# Patient Record
Sex: Male | Born: 1935 | Race: White | Hispanic: No | Marital: Married | Smoking: Never smoker
Health system: Southern US, Community
[De-identification: ages and names within clinical notes are randomized; demographics above are authoritative.]

## PROBLEM LIST (undated history)

## (undated) DIAGNOSIS — M109 Gout, unspecified: Secondary | ICD-10-CM

## (undated) DIAGNOSIS — L97522 Non-pressure chronic ulcer of other part of left foot with fat layer exposed: Secondary | ICD-10-CM

## (undated) DIAGNOSIS — M899 Disorder of bone, unspecified: Secondary | ICD-10-CM

## (undated) DIAGNOSIS — I252 Old myocardial infarction: Secondary | ICD-10-CM

## (undated) HISTORY — PX: SMALL INTESTINE SURGERY: SHX150

## (undated) HISTORY — PX: TONSILLECTOMY: SUR1361

## (undated) HISTORY — DX: Old myocardial infarction: I25.2

## (undated) HISTORY — PX: CERVICAL SPINE SURGERY: SHX589

## (undated) HISTORY — DX: Non-pressure chronic ulcer of other part of left foot with fat layer exposed: L97.522

## (undated) HISTORY — PX: PARTIAL KNEE ARTHROPLASTY: SHX2174

## (undated) HISTORY — PX: ABDOMINAL ADHESION SURGERY: SHX90

## (undated) HISTORY — PX: BLEPHAROPLASTY: SUR158

## (undated) HISTORY — PX: ORIF TIBIAL SHAFT FRACTURE W/ PLATES AND SCREWS: SUR964

## (undated) HISTORY — PX: CHOLECYSTECTOMY: SHX55

## (undated) HISTORY — DX: Gout, unspecified: M10.9

## (undated) HISTORY — PX: CATARACT EXTRACTION: SUR2

## (undated) HISTORY — PX: NASAL SINUS SURGERY: SHX719

## (undated) HISTORY — DX: Disorder of bone, unspecified: M89.9

---

## 1998-04-08 HISTORY — PX: CORONARY ANGIOPLASTY WITH STENT PLACEMENT: SHX49

## 2006-04-17 ENCOUNTER — Inpatient Hospital Stay (HOSPITAL_COMMUNITY): Admission: RE | Admit: 2006-04-17 | Discharge: 2006-04-18 | Payer: Self-pay | Admitting: Orthopedic Surgery

## 2006-05-20 ENCOUNTER — Inpatient Hospital Stay (HOSPITAL_COMMUNITY): Admission: RE | Admit: 2006-05-20 | Discharge: 2006-05-21 | Payer: Self-pay | Admitting: Orthopedic Surgery

## 2006-06-04 ENCOUNTER — Ambulatory Visit (HOSPITAL_COMMUNITY): Admission: RE | Admit: 2006-06-04 | Discharge: 2006-06-04 | Payer: Self-pay | Admitting: Orthopedic Surgery

## 2006-06-04 ENCOUNTER — Ambulatory Visit: Payer: Self-pay | Admitting: Vascular Surgery

## 2014-12-02 DIAGNOSIS — E1149 Type 2 diabetes mellitus with other diabetic neurological complication: Secondary | ICD-10-CM

## 2014-12-02 DIAGNOSIS — E1142 Type 2 diabetes mellitus with diabetic polyneuropathy: Secondary | ICD-10-CM | POA: Insufficient documentation

## 2014-12-02 DIAGNOSIS — E119 Type 2 diabetes mellitus without complications: Secondary | ICD-10-CM | POA: Insufficient documentation

## 2014-12-02 HISTORY — DX: Type 2 diabetes mellitus with other diabetic neurological complication: E11.49

## 2015-02-15 DIAGNOSIS — I25119 Atherosclerotic heart disease of native coronary artery with unspecified angina pectoris: Secondary | ICD-10-CM

## 2015-02-15 DIAGNOSIS — I219 Acute myocardial infarction, unspecified: Secondary | ICD-10-CM

## 2015-02-15 DIAGNOSIS — I110 Hypertensive heart disease with heart failure: Secondary | ICD-10-CM

## 2015-02-15 HISTORY — DX: Hypertensive heart disease with heart failure: I11.0

## 2015-02-15 HISTORY — DX: Acute myocardial infarction, unspecified: I21.9

## 2015-02-15 HISTORY — DX: Atherosclerotic heart disease of native coronary artery with unspecified angina pectoris: I25.119

## 2015-05-17 DIAGNOSIS — M1A00X0 Idiopathic chronic gout, unspecified site, without tophus (tophi): Secondary | ICD-10-CM

## 2015-05-17 HISTORY — DX: Idiopathic chronic gout, unspecified site, without tophus (tophi): M1A.00X0

## 2015-06-12 DIAGNOSIS — J449 Chronic obstructive pulmonary disease, unspecified: Secondary | ICD-10-CM

## 2015-06-12 DIAGNOSIS — I482 Chronic atrial fibrillation, unspecified: Secondary | ICD-10-CM

## 2015-06-12 DIAGNOSIS — K4090 Unilateral inguinal hernia, without obstruction or gangrene, not specified as recurrent: Secondary | ICD-10-CM

## 2015-06-12 DIAGNOSIS — I4821 Permanent atrial fibrillation: Secondary | ICD-10-CM

## 2015-06-12 DIAGNOSIS — K219 Gastro-esophageal reflux disease without esophagitis: Secondary | ICD-10-CM

## 2015-06-12 HISTORY — DX: Unilateral inguinal hernia, without obstruction or gangrene, not specified as recurrent: K40.90

## 2015-06-12 HISTORY — DX: Gastro-esophageal reflux disease without esophagitis: K21.9

## 2015-06-12 HISTORY — DX: Chronic obstructive pulmonary disease, unspecified: J44.9

## 2015-06-12 HISTORY — DX: Chronic atrial fibrillation, unspecified: I48.20

## 2015-06-12 HISTORY — DX: Permanent atrial fibrillation: I48.21

## 2015-08-14 DIAGNOSIS — R0602 Shortness of breath: Secondary | ICD-10-CM

## 2015-08-14 HISTORY — DX: Shortness of breath: R06.02

## 2015-08-30 DIAGNOSIS — C8599 Non-Hodgkin lymphoma, unspecified, extranodal and solid organ sites: Secondary | ICD-10-CM

## 2015-08-30 DIAGNOSIS — I272 Pulmonary hypertension, unspecified: Secondary | ICD-10-CM

## 2015-08-30 DIAGNOSIS — G4733 Obstructive sleep apnea (adult) (pediatric): Secondary | ICD-10-CM

## 2015-08-30 DIAGNOSIS — G2581 Restless legs syndrome: Secondary | ICD-10-CM

## 2015-08-30 DIAGNOSIS — C179 Malignant neoplasm of small intestine, unspecified: Secondary | ICD-10-CM

## 2015-08-30 HISTORY — DX: Malignant neoplasm of small intestine, unspecified: C17.9

## 2015-08-30 HISTORY — DX: Restless legs syndrome: G25.81

## 2015-08-30 HISTORY — DX: Pulmonary hypertension, unspecified: I27.20

## 2015-08-30 HISTORY — DX: Non-Hodgkin lymphoma, unspecified, extranodal and solid organ sites: C85.99

## 2015-08-30 HISTORY — DX: Obstructive sleep apnea (adult) (pediatric): G47.33

## 2015-09-01 DIAGNOSIS — I5032 Chronic diastolic (congestive) heart failure: Secondary | ICD-10-CM

## 2015-09-01 HISTORY — DX: Chronic diastolic (congestive) heart failure: I50.32

## 2015-11-01 ENCOUNTER — Other Ambulatory Visit (HOSPITAL_COMMUNITY): Payer: Self-pay | Admitting: Dermatology

## 2015-11-01 DIAGNOSIS — D492 Neoplasm of unspecified behavior of bone, soft tissue, and skin: Secondary | ICD-10-CM

## 2015-11-01 DIAGNOSIS — D485 Neoplasm of uncertain behavior of skin: Secondary | ICD-10-CM

## 2015-11-06 ENCOUNTER — Ambulatory Visit (HOSPITAL_COMMUNITY)
Admission: RE | Admit: 2015-11-06 | Discharge: 2015-11-06 | Disposition: A | Discharge status: Home / Self Care | Payer: Medicare Other | Source: Ambulatory Visit | Attending: Dermatology | Admitting: Dermatology

## 2015-11-06 DIAGNOSIS — R2231 Localized swelling, mass and lump, right upper limb: Secondary | ICD-10-CM | POA: Diagnosis not present

## 2015-11-06 DIAGNOSIS — D485 Neoplasm of uncertain behavior of skin: Secondary | ICD-10-CM | POA: Insufficient documentation

## 2015-11-06 DIAGNOSIS — D492 Neoplasm of unspecified behavior of bone, soft tissue, and skin: Secondary | ICD-10-CM

## 2015-11-10 ENCOUNTER — Other Ambulatory Visit (HOSPITAL_COMMUNITY): Payer: Self-pay | Admitting: Dermatology

## 2015-11-10 ENCOUNTER — Ambulatory Visit (HOSPITAL_COMMUNITY)
Admission: RE | Admit: 2015-11-10 | Discharge: 2015-11-10 | Disposition: A | Discharge status: Home / Self Care | Payer: Medicare Other | Source: Ambulatory Visit | Attending: Dermatology | Admitting: Dermatology

## 2015-11-10 DIAGNOSIS — R9389 Abnormal findings on diagnostic imaging of other specified body structures: Secondary | ICD-10-CM

## 2015-11-16 ENCOUNTER — Ambulatory Visit
Admission: RE | Admit: 2015-11-16 | Discharge: 2015-11-16 | Disposition: A | Discharge status: Home / Self Care | Payer: Medicare Other | Source: Ambulatory Visit | Attending: Dermatology | Admitting: Dermatology

## 2015-11-16 ENCOUNTER — Other Ambulatory Visit: Payer: Self-pay | Admitting: Dermatology

## 2015-11-16 DIAGNOSIS — R9389 Abnormal findings on diagnostic imaging of other specified body structures: Secondary | ICD-10-CM

## 2015-11-16 MED ORDER — GADOBENATE DIMEGLUMINE 529 MG/ML IV SOLN: 20.0000 mL | Freq: Once | INTRAVENOUS | Status: DC | PRN

## 2015-11-16 MED ORDER — GADOBENATE DIMEGLUMINE 529 MG/ML IV SOLN
20.0000 mL | Freq: Once | INTRAVENOUS | Status: AC | PRN
  Administered 2015-11-16: 20 mL via INTRAVENOUS

## 2015-11-27 DIAGNOSIS — M6740 Ganglion, unspecified site: Secondary | ICD-10-CM

## 2015-11-27 DIAGNOSIS — G4700 Insomnia, unspecified: Secondary | ICD-10-CM | POA: Insufficient documentation

## 2015-11-27 HISTORY — DX: Insomnia, unspecified: G47.00

## 2015-11-27 HISTORY — DX: Ganglion, unspecified site: M67.40

## 2016-03-12 DIAGNOSIS — M1990 Unspecified osteoarthritis, unspecified site: Secondary | ICD-10-CM

## 2016-03-12 DIAGNOSIS — H269 Unspecified cataract: Secondary | ICD-10-CM | POA: Insufficient documentation

## 2016-03-12 DIAGNOSIS — Z961 Presence of intraocular lens: Secondary | ICD-10-CM

## 2016-03-12 HISTORY — DX: Unspecified osteoarthritis, unspecified site: M19.90

## 2016-03-12 HISTORY — DX: Presence of intraocular lens: Z96.1

## 2016-04-11 DIAGNOSIS — K56609 Unspecified intestinal obstruction, unspecified as to partial versus complete obstruction: Secondary | ICD-10-CM

## 2016-04-11 DIAGNOSIS — Z6835 Body mass index (BMI) 35.0-35.9, adult: Secondary | ICD-10-CM | POA: Insufficient documentation

## 2016-04-11 DIAGNOSIS — E669 Obesity, unspecified: Secondary | ICD-10-CM

## 2016-04-11 DIAGNOSIS — E785 Hyperlipidemia, unspecified: Secondary | ICD-10-CM

## 2016-04-11 DIAGNOSIS — D72829 Elevated white blood cell count, unspecified: Secondary | ICD-10-CM

## 2016-04-11 HISTORY — DX: Body mass index (BMI) 35.0-35.9, adult: Z68.35

## 2016-04-11 HISTORY — DX: Obesity, unspecified: E66.9

## 2016-04-11 HISTORY — DX: Hyperlipidemia, unspecified: E78.5

## 2016-04-11 HISTORY — DX: Elevated white blood cell count, unspecified: D72.829

## 2016-04-11 HISTORY — DX: Unspecified intestinal obstruction, unspecified as to partial versus complete obstruction: K56.609

## 2016-05-02 DIAGNOSIS — H2510 Age-related nuclear cataract, unspecified eye: Secondary | ICD-10-CM | POA: Insufficient documentation

## 2016-05-02 HISTORY — DX: Age-related nuclear cataract, unspecified eye: H25.10

## 2016-06-17 DIAGNOSIS — N529 Male erectile dysfunction, unspecified: Secondary | ICD-10-CM

## 2016-06-17 HISTORY — DX: Male erectile dysfunction, unspecified: N52.9

## 2016-10-10 ENCOUNTER — Encounter: Payer: Self-pay | Admitting: Cardiology

## 2016-10-11 ENCOUNTER — Encounter: Payer: Self-pay | Admitting: Cardiology

## 2016-10-11 ENCOUNTER — Ambulatory Visit (INDEPENDENT_AMBULATORY_CARE_PROVIDER_SITE_OTHER): Payer: Medicare Other | Admitting: Cardiology

## 2016-10-11 ENCOUNTER — Encounter (INDEPENDENT_AMBULATORY_CARE_PROVIDER_SITE_OTHER): Payer: Self-pay

## 2016-10-11 VITALS — BP 126/66 | HR 62 | Ht 70.75 in | Wt 285.8 lb

## 2016-10-11 DIAGNOSIS — E785 Hyperlipidemia, unspecified: Secondary | ICD-10-CM

## 2016-10-11 DIAGNOSIS — I5032 Chronic diastolic (congestive) heart failure: Secondary | ICD-10-CM | POA: Diagnosis not present

## 2016-10-11 DIAGNOSIS — R42 Dizziness and giddiness: Secondary | ICD-10-CM | POA: Insufficient documentation

## 2016-10-11 DIAGNOSIS — I10 Essential (primary) hypertension: Secondary | ICD-10-CM | POA: Diagnosis not present

## 2016-10-11 DIAGNOSIS — I482 Chronic atrial fibrillation, unspecified: Secondary | ICD-10-CM

## 2016-10-11 DIAGNOSIS — Z7901 Long term (current) use of anticoagulants: Secondary | ICD-10-CM

## 2016-10-11 DIAGNOSIS — I25118 Atherosclerotic heart disease of native coronary artery with other forms of angina pectoris: Secondary | ICD-10-CM | POA: Diagnosis not present

## 2016-10-11 HISTORY — DX: Dizziness and giddiness: R42

## 2016-10-11 HISTORY — DX: Long term (current) use of anticoagulants: Z79.01

## 2016-10-11 MED ORDER — METOPROLOL SUCCINATE ER 50 MG PO TB24: 50.0000 mg | ORAL_TABLET | Freq: Every day | ORAL | 5 refills | Status: DC

## 2016-10-11 NOTE — Patient Instructions (Addendum)
Medication Instructions:  Your physician has recommended you make the following change in your medication: decrease metoprolol from twice daily to once daily.   Labwork: None  Testing/Procedures: Your physician has recommended that you wear a holter monitor. Holter monitors are medical devices that record the heart's electrical activity. Doctors most often use these monitors to diagnose arrhythmias. Arrhythmias are problems with the speed or rhythm of the heartbeat. The monitor is a small, portable device. You can wear one while you do your normal daily activities. This is usually used to diagnose what is causing palpitations/syncope (passing out).  You will have this put on at [facility], [place], [place] [address]    Follow-Up: Your physician recommends that you schedule a follow-up appointment in: 6 weeks.   Any Other Special Instructions Will Be Listed Below (If Applicable).     If you need a refill on your cardiac medications before your next appointment, please call your pharmacy.  f

## 2016-10-11 NOTE — Progress Notes (Signed)
Cardiology Office Note:    Date:  10/11/2016   ID:  [name], DOB 1935-12-09, MRN [id number]  PCP:  Algis Greenhouse, MD  Cardiologist:  Shirlee More, MD    Referring MD: Algis Greenhouse, MD    ASSESSMENT:    1. Chronic atrial fibrillation ([place])   2. Chronic diastolic heart failure ([place])   3. Coronary artery disease of native artery of native heart with stable angina pectoris ([place])   4. Benign essential hypertension   5. Hyperlipidemia, unspecified hyperlipidemia type   6. Chronic anticoagulation    PLAN:    In order of problems listed above:  1. I'm concerned he is symptomatic bradycardia as his resting heart rate is relatively slow. Decrease his beta blocker dose by 50% where Holter monitor in approximately one week rates remain slow will discontinue his beta blocker. 2. stable compensated continue his current diuretic. 3. Stable continue current medical treatment including statin and beta blocker and calcium channel blocker. 4. Stable continue current medical treatment blood pressure target and all blood pressure determinations show no evidence of hypotension 5. They will continue his statin lipids are ideal 6. Stable continue his current anticoagulant without bleeding complication  Next appointment: 6 weeks   Medication Adjustments/Labs and Tests Ordered: Current medicines are reviewed at length with the patient today.  Concerns regarding medicines are outlined above.  No orders of the defined types were placed in this encounter.  No orders of the defined types were placed in this encounter.   Chief Complaint  Patient presents with  . Follow-up    Routine flup appt for heart failure, heart failure and CAD  . Dizziness    History of Present Illness:    Shawn Blanchard is a 81 y.o. male with a hx of CAD, CHF, Atrial Fibrillation, Dyslipidemia, HTN, S/P PCI of LAD 2002 EF 47% in 2016 last seen September 2017.He is having intermittent lightheadedness, near  syncope without hypotension on home BP checks.Otherwise he is pleased with the quality is life has no chest pain shortness of breath syncope or TIA. Compliance with diet, lifestyle and medications: Yes Past Medical History:  Diagnosis Date  . Old MI (myocardial infarction)     Past Surgical History:  Procedure Laterality Date  . CATARACT EXTRACTION Left   . CERVICAL SPINE SURGERY    . CHOLECYSTECTOMY    . NASAL SINUS SURGERY    . PARTIAL KNEE ARTHROPLASTY Bilateral   . SMALL INTESTINE SURGERY    . TONSILLECTOMY      Current Medications: Current Meds  Medication Sig  . albuterol (PROVENTIL HFA;VENTOLIN HFA) 108 (90 Base) MCG/ACT inhaler Inhale 2 puffs into the lungs every 6 (six) hours as needed for wheezing or shortness of breath.  . allopurinol (ZYLOPRIM) 100 MG tablet Take 50 mg by mouth 2 (two) times daily.  Marland Kitchen amLODipine-valsartan (EXFORGE) 5-320 MG tablet Take by mouth daily.  . Coenzyme Q10 100 MG capsule Take 100 mg by mouth daily.  . colchicine 0.6 MG tablet Take 0.6 mg by mouth daily as needed.  . dabigatran (PRADAXA) 150 MG CAPS capsule Take 150 mg by mouth 2 (two) times daily.  . furosemide (LASIX) 40 MG tablet Take 40 mg by mouth 2 (two) times daily.  . meclizine (ANTIVERT) 25 MG tablet Take 25 mg by mouth 3 (three) times daily as needed.  . metoprolol succinate (TOPROL-XL) 50 MG 24 hr tablet Take 50 mg by mouth 2 (two) times daily.  . nitroGLYCERIN (NITROSTAT) 0.4 MG  SL tablet Place 0.4 mg under the tongue.  . pramipexole (MIRAPEX) 0.25 MG tablet Take 0.25 mg by mouth 2 (two) times daily.  . pravastatin (PRAVACHOL) 40 MG tablet Take 40 mg by mouth daily.  . Saxagliptin-Metformin 2.08-998 MG TB24 Take by mouth 2 (two) times daily.  . trazodone (DESYREL) 300 MG tablet Take 300 mg by mouth at bedtime as needed.  . Turmeric POWD by Misc.(Non-Drug; Combo Route) route.  . vitamin E 400 UNIT capsule Take 400 Units by mouth daily.     Allergies:   Atorvastatin; Ace  inhibitors; Amoxicillin; Meperidine; Naproxen sodium; Sulfamethoxazole; and Tetracycline   Social History   Social History  . Marital status: Married    Spouse name: N/A  . Number of children: N/A  . Years of education: N/A   Social History Main Topics  . Smoking status: Never Smoker  . Smokeless tobacco: Never Used  . Alcohol use None  . Drug use: Unknown  . Sexual activity: Not Asked   Other Topics Concern  . None   Social History Narrative  . None     Family History: The patient's family history includes Cancer in his mother; Diabetes in his father and mother; Heart disease in his father and mother; Stroke in his father. ROS:   Please see the history of present illness.    All other systems reviewed and are negative.  EKGs/Labs/Other Studies Reviewed:    The following studies were reviewed today:  Recent Labs: CMP 04/11/16 normal,  No results found for requested labs within last 8760 hours.  Recent Lipid Panel 08/28/16 Chol 147, HDL 36, LDL 96 Aic 7.4% No results found for: CHOL, TRIG, HDL, CHOLHDL, VLDL, LDLCALC, LDLDIRECT  Physical Exam:    VS:  BP 126/66   Pulse 62   Ht 5' 10.75" (1.797 m)   Wt 285 lb 12.8 oz (129.6 kg)   SpO2 97%   BMI 40.14 kg/m     Wt Readings from Last 3 Encounters:  10/11/16 285 lb 12.8 oz (129.6 kg)     GEN:  Well nourished, well developed in no acute distress HEENT: Normal NECK: No JVD; No carotid bruits LYMPHATICS: No lymphadenopathy CARDIAC: Irr Irr, no murmurs, rubs, gallops RESPIRATORY:  Clear to auscultation without rales, wheezing or rhonchi  ABDOMEN: Soft, non-tender, non-distended MUSCULOSKELETAL:  No edema; No deformity  SKIN: Warm and dry NEUROLOGIC:  Alert and oriented x 3 PSYCHIATRIC:  Normal affect    Signed, Shirlee More, MD  10/11/2016 8:25 AM    [place]

## 2016-10-22 ENCOUNTER — Ambulatory Visit (INDEPENDENT_AMBULATORY_CARE_PROVIDER_SITE_OTHER): Payer: Medicare Other

## 2016-10-22 DIAGNOSIS — I482 Chronic atrial fibrillation, unspecified: Secondary | ICD-10-CM

## 2016-10-22 DIAGNOSIS — R42 Dizziness and giddiness: Secondary | ICD-10-CM

## 2016-11-07 ENCOUNTER — Telehealth: Payer: Self-pay | Admitting: Cardiology

## 2016-11-07 NOTE — Telephone Encounter (Signed)
Spoke with patient. Advised him that I did not send anything to him to his MyChart, but it looks like his PCP possibly did. Patient will call PCP.

## 2016-11-07 NOTE — Telephone Encounter (Signed)
Pt states he got a message from [facility] but that he can't open it and doesn't know who sent it or what it's about??

## 2016-11-21 ENCOUNTER — Telehealth: Payer: Self-pay | Admitting: Cardiology

## 2016-11-21 DIAGNOSIS — H02831 Dermatochalasis of right upper eyelid: Secondary | ICD-10-CM

## 2016-11-21 DIAGNOSIS — H02834 Dermatochalasis of left upper eyelid: Secondary | ICD-10-CM

## 2016-11-21 HISTORY — DX: Dermatochalasis of right upper eyelid: H02.831

## 2016-11-21 NOTE — Telephone Encounter (Signed)
Patient advised to follow surgeon's advice.

## 2016-11-21 NOTE — Telephone Encounter (Signed)
Patient just had eyelid surgery and has been off Pradaxa and concerned about blood clots and want sto go back on but surgeon suggested him to be off a few more days.. Please call.Marland Kitchen

## 2016-11-21 NOTE — Telephone Encounter (Signed)
Please advise 

## 2016-11-21 NOTE — Telephone Encounter (Signed)
I would follow advice of his surgeon

## 2016-11-24 NOTE — Progress Notes (Signed)
Cardiology Office Note:    Date:  11/25/2016   ID:  Shawn Blanchard, DOB Sep 20, 1935, MRN [id number]  PCP:  Algis Greenhouse, MD  Cardiologist:  Shirlee More, MD    Referring MD: Algis Greenhouse, MD    ASSESSMENT:    1. Chronic atrial fibrillation ([facility])    PLAN:    In order of problems listed above:  1. Improved continue his current beta blocker anticoagulant and I encouraged him to get the adapter for his apple watch monitor heart rhythm at home and contact me if rates are less than 55 bpm.   Next appointment: 6 months   Medication Adjustments/Labs and Tests Ordered: Current medicines are reviewed at length with the patient today.  Concerns regarding medicines are outlined above.  No orders of the defined types were placed in this encounter.  No orders of the defined types were placed in this encounter.   Chief Complaint  Patient presents with  . Follow-up    6 week flup to discuss HM results  . Atrial Fibrillation  . Dizziness    History of Present Illness:    Shawn Blanchard is a 81 y.o. male with a hx of CAD, CHF, Atrial Fibrillation, Dyslipidemia, HTN, S/P PCI of LAD 2002 EF 47% in 2016 last seen September 2017.He is having intermittent lightheadedness, near syncope  last seen 6 weeks ago with slow AF. Compliance with diet, lifestyle and medications: yes Past Medical History:  Diagnosis Date  . Old MI (myocardial infarction)     Past Surgical History:  Procedure Laterality Date  . CATARACT EXTRACTION Left   . CERVICAL SPINE SURGERY    . CHOLECYSTECTOMY    . NASAL SINUS SURGERY    . PARTIAL KNEE ARTHROPLASTY Bilateral   . SMALL INTESTINE SURGERY    . TONSILLECTOMY      Current Medications: No outpatient prescriptions have been marked as taking for the 11/25/16 encounter (Office Visit) with Richardo Priest, MD.     Allergies:   Atorvastatin; Ace inhibitors; Amoxicillin; Meperidine; Naproxen sodium; Sulfamethoxazole; and Tetracycline   Social  History   Social History  . Marital status: Married    Spouse name: N/A  . Number of children: N/A  . Years of education: N/A   Social History Main Topics  . Smoking status: Never Smoker  . Smokeless tobacco: Never Used  . Alcohol use None  . Drug use: Unknown  . Sexual activity: Not Asked   Other Topics Concern  . None   Social History Narrative  . None     Family History: The patient's family history includes Cancer in his mother; Diabetes in his father and mother; Heart disease in his father and mother; Stroke in his father. ROS:   Please see the history of present illness.    All other systems reviewed and are negative. Recent eye surgery for ptosis EKGs/Labs/Other Studies Reviewed:    The following studies were reviewed today:  Recent Labs: No results found for requested labs within last 8760 hours.  Recent Lipid Panel No results found for: CHOL, TRIG, HDL, CHOLHDL, VLDL, LDLCALC, LDLDIRECT  Physical Exam:    VS:  BP 134/64 (BP Location: Right Arm, Patient Position: Sitting)   Pulse 86   Ht 5' 10.75" (1.797 m)   Wt 287 lb (130.2 kg)   SpO2 97%   BMI 40.31 kg/m     Wt Readings from Last 3 Encounters:  11/25/16 287 lb (130.2 kg)  10/11/16 285 lb 12.8 oz (129.6  kg)     GEN:  Well nourished, well developed in no acute distress HEENT: Normal NECK: No JVD; No carotid bruits LYMPHATICS: No lymphadenopathy CARDIAC: Irr irr  no murmurs, rubs, gallops RESPIRATORY:  Clear to auscultation without rales, wheezing or rhonchi  ABDOMEN: Soft, non-tender, non-distended MUSCULOSKELETAL:  No edema; No deformity  SKIN: Warm and dry NEUROLOGIC:  Alert and oriented x 3 PSYCHIATRIC:  Normal affect    Signed, Shirlee More, MD  11/25/2016 12:03 PM    [place]

## 2016-11-25 ENCOUNTER — Ambulatory Visit (INDEPENDENT_AMBULATORY_CARE_PROVIDER_SITE_OTHER): Payer: Medicare Other | Admitting: Cardiology

## 2016-11-25 ENCOUNTER — Encounter: Payer: Self-pay | Admitting: Cardiology

## 2016-11-25 VITALS — BP 134/64 | HR 86 | Ht 70.75 in | Wt 287.0 lb

## 2016-11-25 DIAGNOSIS — I482 Chronic atrial fibrillation, unspecified: Secondary | ICD-10-CM

## 2016-11-25 NOTE — Patient Instructions (Addendum)
Medication Instructions:  Your physician recommends that you continue on your current medications as directed. Please refer to the Current Medication list given to you today.   Labwork: None  Testing/Procedures: None  Follow-Up: Your physician wants you to follow-up in: 6 months. You will receive a reminder letter in the mail two months in advance. If you don't receive a letter, please call our office to schedule the follow-up appointment.   Any Other Special Instructions Will Be Listed Below (If Applicable).     If you need a refill on your cardiac medications before your next appointment, please call your pharmacy.       Introducing KardiaBand, the new wearable EKG by [employer]. Vladimir Faster replaces your original Apple Watch band. The first of its kind, FDA-cleared KardiaBand provides accurate and instant analysis for detecting atrial fibrillation (AF) and normal sinus rhythm in an EKG. Simply place your thumb on the integrated KardiaBand sensor to take a medical-grade EKG in just 30 seconds. Results appear instantly on your Apple Watch. Vladimir Faster is available today for just $199. KardiaBand features are designed exclusively for use with [employer]  - $99 year. The [employer] for [employer] includes [employer]'s revolutionary SmartRhythm monitoring feature. SmartRhythm monitoring uses an intelligent neural network that runs directly on the [employer], constantly acquiring data from the watch's heart rate sensor and its accelerometer. SmartRhythm compares your heart rate to what it expects from your minute-by-minute level of activity. When the network sees a pattern of heart rate and activity that it does not expect, it notifies you to take an EKG. With [facility], peace of mind is just an EKG away.  Marland Kitchen

## 2016-12-26 DIAGNOSIS — J984 Other disorders of lung: Secondary | ICD-10-CM | POA: Insufficient documentation

## 2016-12-26 HISTORY — DX: Other disorders of lung: J98.4

## 2017-04-03 ENCOUNTER — Telehealth: Payer: Self-pay | Admitting: Cardiology

## 2017-04-03 NOTE — Telephone Encounter (Signed)
Please advise 

## 2017-04-03 NOTE — Telephone Encounter (Signed)
Patient states he is having a tooth extracted and one implanted and wants to know how long he should be off Predaxa?

## 2017-04-03 NOTE — Telephone Encounter (Signed)
Patient was informed.

## 2017-04-03 NOTE — Telephone Encounter (Signed)
usualyy no need to stop, if needed 24 hr before and after

## 2017-04-03 NOTE — Telephone Encounter (Signed)
Also needs nitro called to [facility]

## 2017-04-14 ENCOUNTER — Telehealth: Payer: Self-pay | Admitting: Cardiology

## 2017-04-14 MED ORDER — NITROGLYCERIN 0.4 MG SL SUBL: 0.4000 mg | SUBLINGUAL_TABLET | SUBLINGUAL | 11 refills | Status: DC | PRN

## 2017-04-14 NOTE — Telephone Encounter (Signed)
Call nitro to [facility]

## 2017-04-14 NOTE — Telephone Encounter (Signed)
Refill sent.

## 2017-05-26 ENCOUNTER — Other Ambulatory Visit: Payer: Self-pay | Admitting: Cardiology

## 2017-06-11 NOTE — Progress Notes (Signed)
Cardiology Office Note:    Date:  06/12/2017   ID:  [name], DOB 1936-03-17, MRN [id number]  PCP:  Algis Greenhouse, MD  Cardiologist:  Shirlee More, MD    Referring MD: Algis Greenhouse, MD    ASSESSMENT:    1. Chronic atrial fibrillation ([place])   2. Chronic anticoagulation   3. Chronic diastolic heart failure ([place])   4. Hypertensive heart disease with heart failure ([place])   5. Coronary artery disease of native artery of native heart with stable angina pectoris ([place])   6. Hyperlipidemia, unspecified hyperlipidemia type    PLAN:    In order of problems listed above:  1. Stable rate controlled continue his current anticoagulant.  Along with beta-blocker.  He will continue to monitor home heart rate which runs 60-80 bpm at rest.  Continue his current beta-blocker 2. Continue his current anticoagulant 3. Compensated continue his current dose of loop diuretic 4. Blood pressure stable continue current treatment including ARB CCP diuretic beta-blocker 5. Stable continue current medical treatment anticoagulant beta-blocker calcium channel blocker and statin.  I discussed additional treatment he is interested in Zetia and wants to discuss with his PCP   6 months Next appointment: 6 months   Medication Adjustments/Labs and Tests Ordered: Current medicines are reviewed at length with the patient today.  Concerns regarding medicines are outlined above.  No orders of the defined types were placed in this encounter.  No orders of the defined types were placed in this encounter.   Chief Complaint  Patient presents with  . Follow-up  . Atrial Fibrillation  . Congestive Heart Failure  . Coronary Artery Disease  . Anticoagulation    History of Present Illness:    Shawn Blanchard is a 82 y.o. male with a hx of CAD, CHF, Atrial Fibrillation, Dyslipidemia, HTN, S/P PCI of LAD 2002 EF 47% in 2016  last seen 6 months ago.  ASSESSMENT:   11/25/16   1. Chronic atrial  fibrillation ([facility])    PLAN:    I 1.    Improved continue his current beta blocker anticoagulant and I encouraged him to get the adapter for his apple watch monitor heart rhythm at home and contact me if rates are less than 55 bpm.  Compliance with diet, lifestyle and medications: yes  Is quite pleased with the quality of his life is seeing an alternate medicine physician [place] feels his diabetes is improved and is motivated to care for himself.  He monitors his home heart rate with a smart watch 60-80 bpm he has had no palpitations syncope bleeding from his anticoagulant TIA chest pain or shortness of breath.  Recent labs reviewed from his PCP office Past Medical History:  Diagnosis Date  . Old MI (myocardial infarction)     Past Surgical History:  Procedure Laterality Date  . CATARACT EXTRACTION Left   . CERVICAL SPINE SURGERY    . CHOLECYSTECTOMY    . NASAL SINUS SURGERY    . PARTIAL KNEE ARTHROPLASTY Bilateral   . SMALL INTESTINE SURGERY    . TONSILLECTOMY      Current Medications: Current Meds  Medication Sig  . albuterol (PROVENTIL HFA;VENTOLIN HFA) 108 (90 Base) MCG/ACT inhaler Inhale 2 puffs into the lungs every 6 (six) hours as needed for wheezing or shortness of breath.  . allopurinol (ZYLOPRIM) 100 MG tablet Take 50 mg by mouth 2 (two) times daily.  Marland Kitchen amLODipine-valsartan (EXFORGE) 5-320 MG tablet Take by mouth daily.  . Coenzyme Q10 100  MG capsule Take 100 mg by mouth daily.  . colchicine 0.6 MG tablet Take 0.6 mg by mouth daily as needed.  . furosemide (LASIX) 40 MG tablet Take 40 mg by mouth 2 (two) times daily.  Marland Kitchen JARDIANCE 25 MG TABS tablet Take 1 tablet by mouth daily.  . metoprolol succinate (TOPROL-XL) 50 MG 24 hr tablet Take 1 tablet (50 mg total) by mouth daily.  . nitroGLYCERIN (NITROSTAT) 0.4 MG SL tablet Place 1 tablet (0.4 mg total) under the tongue every 5 (five) minutes as needed for chest pain.  Marland Kitchen PRADAXA 150 MG CAPS capsule TAKE 1 CAPSULE BY  MOUTH 2 TIMES DAILY  . pramipexole (MIRAPEX) 0.25 MG tablet Take 0.25 mg by mouth 2 (two) times daily.  Marland Kitchen REPATHA SURECLICK 403 MG/ML SOAJ Inject 140 mg into the skin every 14 (fourteen) days.  . Saxagliptin-Metformin 2.08-998 MG TB24 Take by mouth 2 (two) times daily.  . trazodone (DESYREL) 300 MG tablet Take 300 mg by mouth at bedtime as needed.  . Turmeric POWD by Misc.(Non-Drug; Combo Route) route.  . vitamin E 400 UNIT capsule Take 400 Units by mouth daily.     Allergies:   Atorvastatin; Ace inhibitors; Amoxicillin; Meperidine; Naproxen sodium; Sulfamethoxazole; and Tetracycline   Social History   Socioeconomic History  . Marital status: Married    Spouse name: None  . Number of children: None  . Years of education: None  . Highest education level: None  Social Needs  . Financial resource strain: None  . Food insecurity - worry: None  . Food insecurity - inability: None  . Transportation needs - medical: None  . Transportation needs - non-medical: None  Occupational History  . None  Tobacco Use  . Smoking status: Never Smoker  . Smokeless tobacco: Never Used  Substance and Sexual Activity  . Alcohol use: None  . Drug use: None  . Sexual activity: None  Other Topics Concern  . None  Social History Narrative  . None     Family History: The patient's family history includes Cancer in his mother; Diabetes in his father and mother; Heart disease in his father and mother; Stroke in his father. ROS:   Please see the history of present illness.    All other systems reviewed and are negative.  EKGs/Labs/Other Studies Reviewed:    The following studies were reviewed today:  EKG:  EKG ordered today.  The ekg ordered today demonstrates AF 62 BPM   Recent Labs:  BMP normal 12/26/16 Recent Lipid Panel Chol 159, HDL 34 LDL 86  Physical Exam:    VS:  BP 124/70   Pulse 69   Ht 5' 10.75" (1.797 m)   Wt 267 lb 1.9 oz (121.2 kg)   SpO2 97%   BMI 37.52 kg/m     Wt  Readings from Last 3 Encounters:  06/12/17 267 lb 1.9 oz (121.2 kg)  11/25/16 287 lb (130.2 kg)  10/11/16 285 lb 12.8 oz (129.6 kg)     GEN:  Well nourished, well developed in no acute distress HEENT: Normal NECK: No JVD; No carotid bruits LYMPHATICS: No lymphadenopathy CARDIAC: Irr Irr variable s1  no murmurs, rubs, gallops RESPIRATORY:  Clear to auscultation without rales, wheezing or rhonchi  ABDOMEN: Soft, non-tender, non-distended MUSCULOSKELETAL:  No edema; No deformity  SKIN: Warm and dry NEUROLOGIC:  Alert and oriented x 3 PSYCHIATRIC:  Normal affect    Signed, Shirlee More, MD  06/12/2017 9:31 AM    [place]  [facility]

## 2017-06-12 ENCOUNTER — Encounter: Payer: Self-pay | Admitting: Cardiology

## 2017-06-12 ENCOUNTER — Ambulatory Visit: Payer: Medicare Other | Admitting: Cardiology

## 2017-06-12 VITALS — BP 124/70 | HR 69 | Ht 70.75 in | Wt 267.1 lb

## 2017-06-12 DIAGNOSIS — I482 Chronic atrial fibrillation, unspecified: Secondary | ICD-10-CM

## 2017-06-12 DIAGNOSIS — I5032 Chronic diastolic (congestive) heart failure: Secondary | ICD-10-CM

## 2017-06-12 DIAGNOSIS — E785 Hyperlipidemia, unspecified: Secondary | ICD-10-CM

## 2017-06-12 DIAGNOSIS — Z7901 Long term (current) use of anticoagulants: Secondary | ICD-10-CM | POA: Diagnosis not present

## 2017-06-12 DIAGNOSIS — I25118 Atherosclerotic heart disease of native coronary artery with other forms of angina pectoris: Secondary | ICD-10-CM | POA: Diagnosis not present

## 2017-06-12 DIAGNOSIS — I110 Hypertensive heart disease with heart failure: Secondary | ICD-10-CM

## 2017-06-12 NOTE — Patient Instructions (Signed)

## 2017-09-15 DIAGNOSIS — R42 Dizziness and giddiness: Secondary | ICD-10-CM

## 2017-09-15 HISTORY — DX: Dizziness and giddiness: R42

## 2017-10-06 NOTE — Progress Notes (Signed)
Cardiology Office Note:    Date:  10/08/2017   ID:  [name], DOB 1935/07/24, MRN [id number]  PCP:  Algis Greenhouse, MD  Cardiologist:  Shirlee More, MD    Referring MD: Algis Greenhouse, MD    ASSESSMENT:    1. Hypotension due to drugs   2. Coronary artery disease involving native coronary artery of native heart with angina pectoris ([place])   3. Chronic diastolic heart failure ([place])   4. Chronic atrial fibrillation (HCC)   5. Hypertensive heart disease with heart failure ([place])   6. Mixed hyperlipidemia   7. Chronic anticoagulation    PLAN:    In order of problems listed above:  1. His predominant problem is orthostatic hypotension with a standing blood pressure mean of 70.  I suspect this is multifactorial and related to his diabetes and medical treatment along with his diuretic and antihypertensive.  His heart failure is compensated I will continue his current loop diuretic I asked him to stop his ARB follow home blood pressures and reassess in the office in 3 weeks. 2. Stable continue current medical treatment if he fails to improve he may require an ischemia evaluation 3. Stable compensated continue his current diuretic he has no fluid overload 4. Stable rate controlled with and continue his beta-blocker along with his anticoagulant 5. He is symptomatic hypotensive see #1.  He no longer requires ARB 6. Med review shows he is not taking statin I will explore with him in the office in follow-up if he truly statin intolerant benefit from [place] [facility] treatment 7. Continue his anticoagulant with moderate stroke risk  3 weeks Next appointment: 3 weeks   Medication Adjustments/Labs and Tests Ordered: Current medicines are reviewed at length with the patient today.  Concerns regarding medicines are outlined above.  Orders Placed This Encounter  Procedures  . EKG 12-Lead   No orders of the defined types were placed in this encounter.   Chief Complaint  Patient presents with   . Dizziness    Upon rising  . Coronary Artery Disease  . Congestive Heart Failure  . Atrial Fibrillation  . Hypertension    History of Present Illness:    Shawn Blanchard is a 82 y.o. male with a hx of CAD, CHF, Atrial Fibrillation, Dyslipidemia, HTN, S/P PCI of LAD 2002 EF 47% in 2016     last seen 06/22/17. Compliance with diet, lifestyle and medications: Yes  He has a long-standing intermittent problem with orthostatic lightheadedness no vertigo no tinnitus and it is only from supine to standing especially in the morning when he first gets up.  Recently the symptoms have worsened.  He has lost a significant amount of weight in 6 and SGLT agent for diabetes.  He was seen with his PCP and instructed to reduce diuretic as well as ARB and he did not.  In office today he has demonstrable orthostatic hypotension has been blood pressure standing is in the range of 70 mmHg and asked him to discontinue his ARB check home blood pressures daily and follow-up in the office in 3 weeks.  In the last few years he has had Holter monitor and ischemia evaluation at this time will hold on cardiac diagnostic testing unless he fails to improve. He has become health-conscious has received alternate therapy is strict with his diet and except for lightheadedness on changing posture feels well and has had no angina dyspnea palpitation or syncope. Past Medical History:  Diagnosis Date  . Old  MI (myocardial infarction)     Past Surgical History:  Procedure Laterality Date  . CATARACT EXTRACTION Left   . CERVICAL SPINE SURGERY    . CHOLECYSTECTOMY    . NASAL SINUS SURGERY    . PARTIAL KNEE ARTHROPLASTY Bilateral   . SMALL INTESTINE SURGERY    . TONSILLECTOMY      Current Medications: Current Meds  Medication Sig  . albuterol (PROVENTIL HFA;VENTOLIN HFA) 108 (90 Base) MCG/ACT inhaler Inhale 2 puffs into the lungs every 6 (six) hours as needed for wheezing or shortness of breath.  . allopurinol  (ZYLOPRIM) 100 MG tablet Take 100 mg by mouth 2 (two) times daily.   . Coenzyme Q10 100 MG capsule Take 100 mg by mouth daily.  . colchicine 0.6 MG tablet Take 0.6 mg by mouth daily as needed.  . furosemide (LASIX) 40 MG tablet Take 40 mg by mouth 2 (two) times daily.  Marland Kitchen JARDIANCE 25 MG TABS tablet Take 1 tablet by mouth daily.  . metoprolol succinate (TOPROL-XL) 50 MG 24 hr tablet Take 1 tablet (50 mg total) by mouth daily.  . nitroGLYCERIN (NITROSTAT) 0.4 MG SL tablet Place 1 tablet (0.4 mg total) under the tongue every 5 (five) minutes as needed for chest pain.  Marland Kitchen PRADAXA 150 MG CAPS capsule TAKE 1 CAPSULE BY MOUTH 2 TIMES DAILY  . pramipexole (MIRAPEX) 0.25 MG tablet Take 0.25 mg by mouth 2 (two) times daily.  . Saxagliptin-Metformin 2.08-998 MG TB24 Take by mouth 2 (two) times daily.  . trazodone (DESYREL) 300 MG tablet Take 300 mg by mouth at bedtime as needed.  . Turmeric POWD by Misc.(Non-Drug; Combo Route) route.  . valsartan (DIOVAN) 320 MG tablet Take by mouth daily. Take 320 mg daily     Allergies:   Atorvastatin; Ace inhibitors; Amoxicillin; Meperidine; Naproxen sodium; Sulfamethoxazole; and Tetracycline   Social History   Socioeconomic History  . Marital status: Married    Spouse name: Not on file  . Number of children: Not on file  . Years of education: Not on file  . Highest education level: Not on file  Occupational History  . Not on file  Social Needs  . Financial resource strain: Not on file  . Food insecurity:    Worry: Not on file    Inability: Not on file  . Transportation needs:    Medical: Not on file    Non-medical: Not on file  Tobacco Use  . Smoking status: Never Smoker  . Smokeless tobacco: Never Used  Substance and Sexual Activity  . Alcohol use: Not on file  . Drug use: Not on file  . Sexual activity: Not on file  Lifestyle  . Physical activity:    Days per week: Not on file    Minutes per session: Not on file  . Stress: Not on file    Relationships  . Social connections:    Talks on phone: Not on file    Gets together: Not on file    Attends religious service: Not on file    Active member of club or organization: Not on file    Attends meetings of clubs or organizations: Not on file    Relationship status: Not on file  Other Topics Concern  . Not on file  Social History Narrative  . Not on file     Family History: The patient's family history includes Cancer in his mother; Diabetes in his father and mother; Heart disease in his father and mother;  Stroke in his father. ROS:   Please see the history of present illness.    All other systems reviewed and are negative.  EKGs/Labs/Other Studies Reviewed:    The following studies were reviewed today:  EKG:  EKG ordered today.  The ekg ordered today demonstrates atrial fibrillation controlled ventricular rate Asked me to reviewed labs from alternate medicine with [facility] his high-sensitivity CRP is quite elevated  Recent Labs:   06/30/17 A1C 6.5 No results found for requested labs within last 8760 hours.  Recent Lipid Panel   06/30/17 Chol 70, HDL 30 LDL 24 No results found for: CHOL, TRIG, HDL, CHOLHDL, VLDL, LDLCALC, LDLDIRECT  Physical Exam:    VS:  BP 130/64 (BP Location: Right Arm, Patient Position: Sitting, Cuff Size: Normal)   Pulse (!) 58   Ht 5' 10.75" (1.797 m)   Wt 161 lb (73 kg)   SpO2 96%   BMI 22.61 kg/m     Wt Readings from Last 3 Encounters:  10/08/17 161 lb (73 kg)  06/12/17 267 lb 1.9 oz (121.2 kg)  11/25/16 287 lb (130.2 kg)    Orthostatic blood pressure by me sitting 118/56 standing 104/50 with lightheadedness GEN:  Well nourished, well developed in no acute distress HEENT: Normal NECK: No JVD; No carotid bruits LYMPHATICS: No lymphadenopathy CARDIAC: Irregular irregular variable first heart sound  RESPIRATORY:  Clear to auscultation without rales, wheezing or rhonchi  ABDOMEN: Soft, non-tender, non-distended MUSCULOSKELETAL:  No  edema; No deformity  SKIN: Warm and dry NEUROLOGIC:  Alert and oriented x 3 PSYCHIATRIC:  Normal affect    Signed, Shirlee More, MD  10/08/2017 6:04 PM    [facility]

## 2017-10-08 ENCOUNTER — Encounter: Payer: Self-pay | Admitting: Cardiology

## 2017-10-08 ENCOUNTER — Ambulatory Visit: Payer: Medicare Other | Admitting: Cardiology

## 2017-10-08 VITALS — BP 130/64 | HR 58 | Ht 70.75 in | Wt 161.0 lb

## 2017-10-08 DIAGNOSIS — I5032 Chronic diastolic (congestive) heart failure: Secondary | ICD-10-CM

## 2017-10-08 DIAGNOSIS — I110 Hypertensive heart disease with heart failure: Secondary | ICD-10-CM | POA: Diagnosis not present

## 2017-10-08 DIAGNOSIS — I952 Hypotension due to drugs: Secondary | ICD-10-CM

## 2017-10-08 DIAGNOSIS — I25119 Atherosclerotic heart disease of native coronary artery with unspecified angina pectoris: Secondary | ICD-10-CM | POA: Diagnosis not present

## 2017-10-08 DIAGNOSIS — Z7901 Long term (current) use of anticoagulants: Secondary | ICD-10-CM

## 2017-10-08 DIAGNOSIS — I482 Chronic atrial fibrillation, unspecified: Secondary | ICD-10-CM

## 2017-10-08 DIAGNOSIS — E782 Mixed hyperlipidemia: Secondary | ICD-10-CM | POA: Diagnosis not present

## 2017-10-08 NOTE — Patient Instructions (Signed)
Medication Instructions:  Your physician has recommended you make the following change in your medication:  STOP: Valsartan  Your physician has requested that you regularly monitor and record your blood pressure readings at home. Please use the same machine at the same time of day to check your readings and record them to bring to your follow-up visit.   Labwork: NONE  Testing/Procedures: You had an EKG today   Follow-Up: Your physician recommends that you schedule a follow-up appointment in: 3 weeks.   Any Other Special Instructions Will Be Listed Below (If Applicable).     If you need a refill on your cardiac medications before your next appointment, please call your pharmacy.

## 2017-10-27 ENCOUNTER — Other Ambulatory Visit: Payer: Self-pay | Admitting: Cardiology

## 2017-10-27 DIAGNOSIS — I482 Chronic atrial fibrillation, unspecified: Secondary | ICD-10-CM

## 2017-10-27 DIAGNOSIS — R42 Dizziness and giddiness: Secondary | ICD-10-CM

## 2017-10-28 ENCOUNTER — Ambulatory Visit: Payer: Medicare Other | Admitting: Cardiology

## 2017-10-29 ENCOUNTER — Ambulatory Visit: Payer: Medicare Other | Admitting: Cardiology

## 2017-11-07 ENCOUNTER — Encounter: Payer: Self-pay | Admitting: Cardiology

## 2017-11-07 ENCOUNTER — Ambulatory Visit (INDEPENDENT_AMBULATORY_CARE_PROVIDER_SITE_OTHER): Payer: Medicare Other | Admitting: Cardiology

## 2017-11-07 VITALS — BP 132/76 | HR 74 | Ht 70.0 in | Wt 164.0 lb

## 2017-11-07 DIAGNOSIS — I952 Hypotension due to drugs: Secondary | ICD-10-CM

## 2017-11-07 DIAGNOSIS — I5023 Acute on chronic systolic (congestive) heart failure: Secondary | ICD-10-CM

## 2017-11-07 DIAGNOSIS — I5032 Chronic diastolic (congestive) heart failure: Secondary | ICD-10-CM

## 2017-11-07 DIAGNOSIS — I482 Chronic atrial fibrillation, unspecified: Secondary | ICD-10-CM

## 2017-11-07 DIAGNOSIS — I110 Hypertensive heart disease with heart failure: Secondary | ICD-10-CM | POA: Diagnosis not present

## 2017-11-07 DIAGNOSIS — I25119 Atherosclerotic heart disease of native coronary artery with unspecified angina pectoris: Secondary | ICD-10-CM

## 2017-11-07 DIAGNOSIS — E785 Hyperlipidemia, unspecified: Secondary | ICD-10-CM

## 2017-11-07 DIAGNOSIS — N529 Male erectile dysfunction, unspecified: Secondary | ICD-10-CM

## 2017-11-07 DIAGNOSIS — I10 Essential (primary) hypertension: Secondary | ICD-10-CM

## 2017-11-07 DIAGNOSIS — Z7901 Long term (current) use of anticoagulants: Secondary | ICD-10-CM

## 2017-11-07 HISTORY — DX: Hyperlipidemia, unspecified: E78.5

## 2017-11-07 HISTORY — DX: Essential (primary) hypertension: I10

## 2017-11-07 HISTORY — DX: Acute on chronic systolic (congestive) heart failure: I50.23

## 2017-11-07 MED ORDER — TADALAFIL 5 MG PO TABS: 5.0000 mg | ORAL_TABLET | Freq: Every day | ORAL | 0 refills | Status: DC | PRN

## 2017-11-07 NOTE — Patient Instructions (Signed)

## 2017-11-07 NOTE — Progress Notes (Signed)
Cardiology Office Note:    Date:  11/07/2017   ID:  Shawn Blanchard, DOB [birth year], MRN [id number]  PCP:  Algis Greenhouse, MD  Cardiologist:  Shirlee More, MD    Referring MD: Algis Greenhouse, MD    ASSESSMENT:    1. Hypotension due to drugs   2. Hypertensive heart disease with heart failure ([place])   3. Coronary artery disease involving native coronary artery of native heart with angina pectoris ([place])   4. Chronic diastolic heart failure ([place])   5. Chronic atrial fibrillation (HCC)   6. Chronic anticoagulation   7. Dyslipidemia   8. Erectile dysfunction, unspecified erectile dysfunction type    PLAN:    In order of problems listed above:  1. Resolved off his ARB 2. BP is stable continue current treatment including his beta-blocker and diuretic 3. Stable continue medical treatment 4. Stable compensated continue current loop diuretic New York Heart Association class I he has no edema 5. Stable rate controlled continue anticoagulant beta-blocker 6. Stable no bleeding complication continue his anticoagulant 7. Poorly controlled statin intolerant 8. His request prescription for Cialis   Next appointment: As needed, routinely for cardiology 1 year   Medication Adjustments/Labs and Tests Ordered: Current medicines are reviewed at length with the patient today.  Concerns regarding medicines are outlined above.  No orders of the defined types were placed in this encounter.  Meds ordered this encounter  Medications  . tadalafil (CIALIS) 5 MG tablet    Sig: Take 1 tablet (5 mg total) by mouth daily as needed for erectile dysfunction.    Dispense:  10 tablet    Refill:  0    No chief complaint on file.   History of Present Illness:    Shawn Blanchard is a 81 y.o. male with a hx of hypertension history of myocardial infarction with PCI and stent to the LAD in 7035, diastolic heart failure, atrial fibrillation, hypertension chronic anticoagulation and hyper lipidemia.  He  was last seen 10/08/2017.  Myocardial perfusion study 2016 showed no ischemia ejection fraction 47%. Within a few days of stopping his ARB his dizziness resolved and home blood pressures run in the range of 009-381 systolic.  He feels markedly improved is pleased with the quality of life and has had no angina dyspnea palpitation or syncope. Compliance with diet, lifestyle and medications: Yes Past Medical History:  Diagnosis Date  . Old MI (myocardial infarction)     Past Surgical History:  Procedure Laterality Date  . CATARACT EXTRACTION Left   . CERVICAL SPINE SURGERY    . CHOLECYSTECTOMY    . NASAL SINUS SURGERY    . PARTIAL KNEE ARTHROPLASTY Bilateral   . SMALL INTESTINE SURGERY    . TONSILLECTOMY      Current Medications: Current Meds  Medication Sig  . albuterol (PROVENTIL HFA;VENTOLIN HFA) 108 (90 Base) MCG/ACT inhaler Inhale 2 puffs into the lungs every 6 (six) hours as needed for wheezing or shortness of breath.  . allopurinol (ZYLOPRIM) 100 MG tablet Take 100 mg by mouth 2 (two) times daily.   . Coenzyme Q10 100 MG capsule Take 100 mg by mouth daily.  . colchicine 0.6 MG tablet Take 0.6 mg by mouth daily as needed.  . furosemide (LASIX) 40 MG tablet Take 40 mg by mouth 2 (two) times daily.  Marland Kitchen JARDIANCE 25 MG TABS tablet Take 1 tablet by mouth daily.  . metoprolol succinate (TOPROL-XL) 50 MG 24 hr tablet TAKE 1 AND 1/2 TABLETS BY  MOUTH EVERY 12HOURS FOR HEART/BLOOD PRESSURE  . nitroGLYCERIN (NITROSTAT) 0.4 MG SL tablet Place 1 tablet (0.4 mg total) under the tongue every 5 (five) minutes as needed for chest pain.  Marland Kitchen PRADAXA 150 MG CAPS capsule TAKE 1 CAPSULE BY MOUTH 2 TIMES DAILY  . pramipexole (MIRAPEX) 0.25 MG tablet Take 0.25 mg by mouth 2 (two) times daily.  . Saxagliptin-Metformin 2.08-998 MG TB24 Take by mouth 2 (two) times daily.  . trazodone (DESYREL) 300 MG tablet Take 300 mg by mouth at bedtime as needed.  . Turmeric POWD by Misc.(Non-Drug; Combo Route) route.      Allergies:   Atorvastatin; Ace inhibitors; Amoxicillin; Meperidine; Naproxen sodium; Sulfamethoxazole; and Tetracycline   Social History   Socioeconomic History  . Marital status: Married    Spouse name: Not on file  . Number of children: Not on file  . Years of education: Not on file  . Highest education level: Not on file  Occupational History  . Not on file  Social Needs  . Financial resource strain: Not on file  . Food insecurity:    Worry: Not on file    Inability: Not on file  . Transportation needs:    Medical: Not on file    Non-medical: Not on file  Tobacco Use  . Smoking status: Never Smoker  . Smokeless tobacco: Never Used  Substance and Sexual Activity  . Alcohol use: Not on file  . Drug use: Not on file  . Sexual activity: Not on file  Lifestyle  . Physical activity:    Days per week: Not on file    Minutes per session: Not on file  . Stress: Not on file  Relationships  . Social connections:    Talks on phone: Not on file    Gets together: Not on file    Attends religious service: Not on file    Active member of club or organization: Not on file    Attends meetings of clubs or organizations: Not on file    Relationship status: Not on file  Other Topics Concern  . Not on file  Social History Narrative  . Not on file     Family History: The patient's family history includes Cancer in his mother; Diabetes in his father and mother; Heart disease in his father and mother; Stroke in his father. ROS:   Please see the history of present illness.    All other systems reviewed and are negative.  EKGs/Labs/Other Studies Reviewed:    The following studies were reviewed today:  He is due for wellness exam and lab work September  Recent Labs:   06/30/17   total cholesterol 70 HDL 30 LDL 24 CMP 12/26/2016 creatinine 1.28 TSH normal liver function normal potassium 4.6   Physical Exam:    VS:  BP 132/76 (BP Location: Right Arm, Patient Position: Sitting,  Cuff Size: Normal)   Pulse 74   Ht 5\' 10"  (1.778 m)   Wt 164 lb (74.4 kg)   SpO2 98%   BMI 23.53 kg/m     Wt Readings from Last 3 Encounters:  11/07/17 164 lb (74.4 kg)  10/08/17 161 lb (73 kg)  06/12/17 267 lb 1.9 oz (121.2 kg)     GEN:  Well nourished, well developed in no acute distress HEENT: Normal NECK: No JVD; No carotid bruits LYMPHATICS: No lymphadenopathy CARDIAC: RRR, no murmurs, rubs, gallops RESPIRATORY:  Clear to auscultation without rales, wheezing or rhonchi  ABDOMEN: Soft, non-tender, non-distended MUSCULOSKELETAL:  No edema; No deformity  SKIN: Warm and dry NEUROLOGIC:  Alert and oriented x 3 PSYCHIATRIC:  Normal affect    Signed, Shirlee More, MD  11/07/2017 5:47 PM    [facility]

## 2018-01-05 ENCOUNTER — Telehealth: Payer: Self-pay | Admitting: Cardiology

## 2018-01-05 ENCOUNTER — Other Ambulatory Visit: Payer: Self-pay

## 2018-01-05 NOTE — Telephone Encounter (Signed)
Patient has been informed that this is okay. ASA will be added to med rec.

## 2018-01-05 NOTE — Telephone Encounter (Signed)
Dr. Garlon Hatchet would like to add 81 mg enteric aspirin, patient would like to inquire regarding if it is ok for him to take this with his pradaxa. Patient states he already has many bruises and spots on his arms. Dr. Garlon Hatchet requested patient call and ask.

## 2018-01-05 NOTE — Telephone Encounter (Signed)
Patient would like a call back regarding taking aspirin

## 2018-01-05 NOTE — Telephone Encounter (Signed)
Ok to use low dose 81 mg aspirin and pradaxa

## 2018-05-04 ENCOUNTER — Other Ambulatory Visit: Payer: Self-pay | Admitting: Cardiology

## 2018-09-21 ENCOUNTER — Other Ambulatory Visit: Payer: Self-pay | Admitting: Cardiology

## 2018-09-21 DIAGNOSIS — R42 Dizziness and giddiness: Secondary | ICD-10-CM

## 2018-09-21 DIAGNOSIS — I482 Chronic atrial fibrillation, unspecified: Secondary | ICD-10-CM

## 2018-10-02 DIAGNOSIS — Z7984 Long term (current) use of oral hypoglycemic drugs: Secondary | ICD-10-CM

## 2018-10-02 DIAGNOSIS — H02883 Meibomian gland dysfunction of right eye, unspecified eyelid: Secondary | ICD-10-CM | POA: Insufficient documentation

## 2018-10-02 DIAGNOSIS — H01009 Unspecified blepharitis unspecified eye, unspecified eyelid: Secondary | ICD-10-CM

## 2018-10-02 DIAGNOSIS — H02831 Dermatochalasis of right upper eyelid: Secondary | ICD-10-CM

## 2018-10-02 DIAGNOSIS — H02886 Meibomian gland dysfunction of left eye, unspecified eyelid: Secondary | ICD-10-CM

## 2018-10-02 HISTORY — DX: Meibomian gland dysfunction of left eye, unspecified eyelid: H02.886

## 2018-10-02 HISTORY — DX: Meibomian gland dysfunction of right eye, unspecified eyelid: H02.883

## 2018-10-02 HISTORY — DX: Dermatochalasis of right upper eyelid: H02.831

## 2018-10-02 HISTORY — DX: Long term (current) use of oral hypoglycemic drugs: Z79.84

## 2018-10-02 HISTORY — DX: Unspecified blepharitis unspecified eye, unspecified eyelid: H01.009

## 2018-11-16 ENCOUNTER — Other Ambulatory Visit: Payer: Self-pay | Admitting: Cardiology

## 2018-11-16 DIAGNOSIS — I482 Chronic atrial fibrillation, unspecified: Secondary | ICD-10-CM

## 2018-11-16 DIAGNOSIS — R42 Dizziness and giddiness: Secondary | ICD-10-CM

## 2018-11-23 ENCOUNTER — Other Ambulatory Visit: Payer: Self-pay | Admitting: Cardiology

## 2018-11-23 DIAGNOSIS — R42 Dizziness and giddiness: Secondary | ICD-10-CM

## 2018-11-23 DIAGNOSIS — I482 Chronic atrial fibrillation, unspecified: Secondary | ICD-10-CM

## 2018-12-16 DIAGNOSIS — E1142 Type 2 diabetes mellitus with diabetic polyneuropathy: Secondary | ICD-10-CM

## 2018-12-16 HISTORY — DX: Type 2 diabetes mellitus with diabetic polyneuropathy: E11.42

## 2018-12-28 ENCOUNTER — Ambulatory Visit (INDEPENDENT_AMBULATORY_CARE_PROVIDER_SITE_OTHER): Payer: Medicare Other

## 2018-12-28 DIAGNOSIS — R42 Dizziness and giddiness: Secondary | ICD-10-CM

## 2018-12-28 DIAGNOSIS — I951 Orthostatic hypotension: Secondary | ICD-10-CM | POA: Diagnosis not present

## 2018-12-28 DIAGNOSIS — I482 Chronic atrial fibrillation, unspecified: Secondary | ICD-10-CM

## 2018-12-29 DIAGNOSIS — L089 Local infection of the skin and subcutaneous tissue, unspecified: Secondary | ICD-10-CM

## 2018-12-29 HISTORY — DX: Local infection of the skin and subcutaneous tissue, unspecified: L08.9

## 2019-01-18 ENCOUNTER — Other Ambulatory Visit: Payer: Self-pay | Admitting: Cardiology

## 2019-01-18 DIAGNOSIS — I482 Chronic atrial fibrillation, unspecified: Secondary | ICD-10-CM

## 2019-01-18 DIAGNOSIS — R42 Dizziness and giddiness: Secondary | ICD-10-CM

## 2019-01-20 ENCOUNTER — Other Ambulatory Visit: Payer: Self-pay | Admitting: Cardiology

## 2019-01-20 DIAGNOSIS — I482 Chronic atrial fibrillation, unspecified: Secondary | ICD-10-CM

## 2019-01-20 DIAGNOSIS — R42 Dizziness and giddiness: Secondary | ICD-10-CM

## 2019-01-20 MED ORDER — METOPROLOL SUCCINATE ER 50 MG PO TB24: ORAL_TABLET | ORAL | 0 refills | Status: DC

## 2019-01-20 NOTE — Telephone Encounter (Signed)
°*  STAT* If patient is at the pharmacy, call can be transferred to refill team.   1. Which medications need to be refilled? (please list name of each medication and dose if known) metoprolol succinate (TOPROL-XL)  2. Which pharmacy/location (including street and city if local pharmacy) is medication to be sent to? 3. Do they need a 30 day or 90 day supply?  [place], [place] - [address] [phone] (Phone) [phone] (Fax)

## 2019-01-20 NOTE — Telephone Encounter (Signed)
Called patient and scheduled him a follow up appointment with Dr. [name], he is overdue. Refilled medication until his follow up appointment.

## 2019-01-22 NOTE — Progress Notes (Signed)
Cardiology Office Note:    Date:  01/25/2019   ID:  Shawn Blanchard, DOB 1935/10/16, MRN [id number]  PCP:  Algis Greenhouse, MD  Cardiologist:  Shirlee More, MD    Referring MD: Algis Greenhouse, MD    ASSESSMENT:    1. Orthostatic hypotension   2. Coronary artery disease involving native coronary artery of native heart with angina pectoris ([place])   3. Chronic diastolic heart failure ([place])   4. Hypertensive heart disease with heart failure ([place])   5. Chronic atrial fibrillation (HCC)   6. Chronic anticoagulation   7. Dyslipidemia    PLAN:    In order of problems listed above:  1. Ongoing symptoms of orthostatic dizziness I asked him to hold his SGLT2 agent follow blood sugars at home and wear a 7-day event monitor to be sure he is not having symptomatic bradycardia with his atrial fibrillation.  And reduce his Toprol-XL from 75 to 50 mg/day. 2. Stable New York Heart Association class I continue medical therapy having no angina on current treatment 3. Stable compensated heart failure continue his low-dose loop diuretic 4. Reduce beta-blocker with relatively slow atrial fibrillation 5. Use beta-blocker 7-day event monitor if rates remain relatively low further reduce his beta-blocker discontinue 6. Continue his current anticoagulant 7. Lipids are at target    Next appointment: 6 months   Medication Adjustments/Labs and Tests Ordered: Current medicines are reviewed at length with the patient today.  Concerns regarding medicines are outlined above.  No orders of the defined types were placed in this encounter.  No orders of the defined types were placed in this encounter.   Chief Complaint  Patient presents with  . Follow-up    for  . Coronary Artery Disease  . Congestive Heart Failure  . Hypertension  . Hyperlipidemia  . Hypotension    Orthostatic    History of Present Illness:    Shawn Blanchard is a 83 y.o. male with a hx of CAD, CHF, Atrial Fibrillation,  Dyslipidemia, HTN, S/P PCI of LAD 2002 EF 47% in 2016 orthostatic hypotension and T2 DM with neuropathy  last seen 11/07/2017. Compliance with diet, lifestyle and medications: Yes  Overall is done well unfortunately continues to have orthostatic symptoms specialist hands in the morning and recently had a fall with dislocation of the finger where he felt as if he would faint.  He has atrial fibrillation and wear a 7-day event monitor to screen for symptomatic bradycardia.  With his diabetes and neuropathy I asked him to hold his SGLT2 agent which may be causing sodium and water loss and to follow blood sugars at home.  His heart failure is compensated no edema orthopnea shortness of breath atrial fibrillation is rate controlled no bleeding complications anticoagulant he has had no angina.  Labs with his PCP shows an A1c of 7.4 LDL was at target 56. Past Medical History:  Diagnosis Date  . Old MI (myocardial infarction)     Past Surgical History:  Procedure Laterality Date  . CATARACT EXTRACTION Left   . CERVICAL SPINE SURGERY    . CHOLECYSTECTOMY    . NASAL SINUS SURGERY    . PARTIAL KNEE ARTHROPLASTY Bilateral   . SMALL INTESTINE SURGERY    . TONSILLECTOMY      Current Medications: Current Meds  Medication Sig  . allopurinol (ZYLOPRIM) 100 MG tablet Take 100 mg by mouth 2 (two) times daily as needed.   Marland Kitchen aspirin EC 81 MG tablet Take 81 mg by  mouth daily.  . Coenzyme Q10 100 MG capsule Take 100 mg by mouth daily.  . colchicine 0.6 MG tablet Take 0.6 mg by mouth daily as needed.  . furosemide (LASIX) 40 MG tablet Take 40 mg by mouth 2 (two) times daily.  Marland Kitchen JARDIANCE 25 MG TABS tablet Take 1 tablet by mouth daily.  Marland Kitchen MELATONIN PO Take 1 tablet by mouth daily as needed.  . metoprolol succinate (TOPROL-XL) 50 MG 24 hr tablet TAKE 1 AND 1/2 TABLETS BY MOUTH EVERY 12HOURS FOR HEART AND BLOOD PRESSURE  . nitroGLYCERIN (NITROSTAT) 0.4 MG SL tablet Place 1 tablet (0.4 mg total) under the tongue  every 5 (five) minutes as needed for chest pain.  Marland Kitchen PRADAXA 150 MG CAPS capsule TAKE 1 CAPSULE BY MOUTH 2 TIMES DAILY  . pramipexole (MIRAPEX) 0.25 MG tablet Take 0.25 mg by mouth 2 (two) times daily.  . Saxagliptin-Metformin 2.08-998 MG TB24 Take 1 tablet by mouth 2 (two) times daily.   . Turmeric POWD by Misc.(Non-Drug; Combo Route) route.     Allergies:   Atorvastatin, Ace inhibitors, Amoxicillin, Meperidine, Naproxen sodium, Sulfamethoxazole, and Tetracycline   Social History   Socioeconomic History  . Marital status: Married    Spouse name: Not on file  . Number of children: Not on file  . Years of education: Not on file  . Highest education level: Not on file  Occupational History  . Not on file  Social Needs  . Financial resource strain: Not on file  . Food insecurity    Worry: Not on file    Inability: Not on file  . Transportation needs    Medical: Not on file    Non-medical: Not on file  Tobacco Use  . Smoking status: Never Smoker  . Smokeless tobacco: Never Used  Substance and Sexual Activity  . Alcohol use: Not Currently  . Drug use: Never  . Sexual activity: Not on file  Lifestyle  . Physical activity    Days per week: Not on file    Minutes per session: Not on file  . Stress: Not on file  Relationships  . Social Herbalist on phone: Not on file    Gets together: Not on file    Attends religious service: Not on file    Active member of club or organization: Not on file    Attends meetings of clubs or organizations: Not on file    Relationship status: Not on file  Other Topics Concern  . Not on file  Social History Narrative  . Not on file     Family History: The patient's family history includes Cancer in his mother; Diabetes in his father and mother; Heart disease in his father and mother; Stroke in his father. ROS:   Please see the history of present illness.    All other systems reviewed and are negative.  EKGs/Labs/Other Studies  Reviewed:    The following studies were reviewed today:  EKG:  EKG ordered today and personally reviewed.  The ekg ordered today demonstrates atrial fibrillation controlled rate 56 bpm  Recent Labs: 12/16/2018: A1C 7.4% Cr 1.3 GFR 52 cc/min Chol 104 TG 204 HDL 32 LDL 55 Hgb 16.6 Hct 50.7  Physical Exam:    VS:  BP 122/74 (BP Location: Right Arm, Patient Position: Sitting, Cuff Size: Large)   Pulse (!) 56   Ht 5\' 11"  (1.803 m)   Wt 273 lb (123.8 kg)   SpO2 98%   BMI  38.08 kg/m     Wt Readings from Last 3 Encounters:  01/25/19 273 lb (123.8 kg)  11/07/17 164 lb (74.4 kg)  10/08/17 161 lb (73 kg)     GEN:  Well nourished, well developed in no acute distress HEENT: Normal NECK: No JVD; No carotid bruits LYMPHATICS: No lymphadenopathy CARDIAC: Irregular rhythm variable first heart sound no murmurs, rubs, gallops RESPIRATORY:  Clear to auscultation without rales, wheezing or rhonchi  ABDOMEN: Soft, non-tender, non-distended MUSCULOSKELETAL:  No edema; No deformity  SKIN: Warm and dry NEUROLOGIC:  Alert and oriented x 3 PSYCHIATRIC:  Normal affect    Signed, Shirlee More, MD  01/25/2019 11:45 AM    [place]

## 2019-01-25 ENCOUNTER — Other Ambulatory Visit: Payer: Self-pay

## 2019-01-25 ENCOUNTER — Encounter: Payer: Self-pay | Admitting: Cardiology

## 2019-01-25 ENCOUNTER — Ambulatory Visit (INDEPENDENT_AMBULATORY_CARE_PROVIDER_SITE_OTHER): Payer: Medicare Other | Admitting: Cardiology

## 2019-01-25 VITALS — BP 122/74 | HR 56 | Ht 71.0 in | Wt 273.0 lb

## 2019-01-25 DIAGNOSIS — I25119 Atherosclerotic heart disease of native coronary artery with unspecified angina pectoris: Secondary | ICD-10-CM | POA: Diagnosis not present

## 2019-01-25 DIAGNOSIS — I951 Orthostatic hypotension: Secondary | ICD-10-CM | POA: Diagnosis not present

## 2019-01-25 DIAGNOSIS — Z7901 Long term (current) use of anticoagulants: Secondary | ICD-10-CM

## 2019-01-25 DIAGNOSIS — I110 Hypertensive heart disease with heart failure: Secondary | ICD-10-CM

## 2019-01-25 DIAGNOSIS — I5032 Chronic diastolic (congestive) heart failure: Secondary | ICD-10-CM

## 2019-01-25 DIAGNOSIS — I482 Chronic atrial fibrillation, unspecified: Secondary | ICD-10-CM

## 2019-01-25 DIAGNOSIS — R42 Dizziness and giddiness: Secondary | ICD-10-CM

## 2019-01-25 DIAGNOSIS — E785 Hyperlipidemia, unspecified: Secondary | ICD-10-CM

## 2019-01-25 MED ORDER — METOPROLOL SUCCINATE ER 50 MG PO TB24: 50.0000 mg | ORAL_TABLET | Freq: Every day | ORAL | 1 refills | Status: DC

## 2019-01-25 NOTE — Patient Instructions (Addendum)
Medication Instructions:  Your physician has recommended you make the following change in your medication:   DECREASE metoprolol succinate (toprol-XL) 50 mg: Take 1 tablet daily   *If you need a refill on your cardiac medications before your next appointment, please call your pharmacy*  Lab Work: None  If you have labs (blood work) drawn today and your tests are completely normal, you will receive your results only by: Marland Kitchen MyChart Message (if you have MyChart) OR . A paper copy in the mail If you have any lab test that is abnormal or we need to change your treatment, we will call you to review the results.  Testing/Procedures: You had an EKG today.   Your physician has recommended that you wear a ZIO monitor. ZIO monitors are medical devices that record the heart's electrical activity. Doctors most often use these monitors to diagnose arrhythmias. Arrhythmias are problems with the speed or rhythm of the heartbeat. The monitor is a small, portable device. You can wear one while you do your normal daily activities. This is usually used to diagnose what is causing palpitations/syncope (passing out). Wear for 7 days.   Follow-Up: At [facility], you and your health needs are our priority.  As part of our continuing mission to provide you with exceptional heart care, we have created designated Provider Care Teams.  These Care Teams include your primary Cardiologist (physician) and Advanced Practice Providers (APPs -  Physician Assistants and Nurse Practitioners) who all work together to provide you with the care you need, when you need it.  Your next appointment:   6 months  The format for your next appointment:   In Person  Provider:   Shirlee More, MD

## 2019-02-09 ENCOUNTER — Other Ambulatory Visit: Payer: Self-pay | Admitting: Cardiology

## 2019-02-18 ENCOUNTER — Telehealth: Payer: Self-pay | Admitting: Cardiology

## 2019-02-18 NOTE — Telephone Encounter (Signed)
Left message on patient's home and cell phone to return call to discuss.

## 2019-02-18 NOTE — Telephone Encounter (Signed)
Please call patient with monitor results. 

## 2019-02-19 NOTE — Telephone Encounter (Signed)
Patient informed of ZIO monitor results and advised to continue his current medications as prescribed per Dr. [name]. Patient is agreeable and verbalized understanding. No further questions.

## 2019-03-25 DIAGNOSIS — G5601 Carpal tunnel syndrome, right upper limb: Secondary | ICD-10-CM

## 2019-03-25 DIAGNOSIS — M79642 Pain in left hand: Secondary | ICD-10-CM | POA: Insufficient documentation

## 2019-03-25 DIAGNOSIS — M79641 Pain in right hand: Secondary | ICD-10-CM

## 2019-03-25 HISTORY — DX: Pain in right hand: M79.641

## 2019-03-25 HISTORY — DX: Carpal tunnel syndrome, right upper limb: G56.01

## 2019-03-25 HISTORY — DX: Pain in left hand: M79.642

## 2019-04-09 HISTORY — PX: CARPAL TUNNEL RELEASE: SHX101

## 2019-04-13 ENCOUNTER — Other Ambulatory Visit: Payer: Self-pay | Admitting: Cardiology

## 2019-04-13 DIAGNOSIS — I482 Chronic atrial fibrillation, unspecified: Secondary | ICD-10-CM

## 2019-04-13 DIAGNOSIS — R42 Dizziness and giddiness: Secondary | ICD-10-CM

## 2019-06-01 ENCOUNTER — Telehealth: Payer: Self-pay | Admitting: Cardiology

## 2019-06-01 MED ORDER — FUROSEMIDE 20 MG PO TABS: 20.0000 mg | ORAL_TABLET | Freq: Two times a day (BID) | ORAL | 3 refills | Status: DC

## 2019-06-01 NOTE — Telephone Encounter (Signed)
Pt c/o medication issue:  1. Name of Medication: furosemide (LASIX) 40 MG tablet  JARDIANCE 25 MG TABS tablet  2. How are you currently taking this medication (dosage and times per day)? Fursemide 2 times daily Jardiance 1 tablet by mouth daily   3. Are you having a reaction (difficulty breathing--STAT)? No  4. What is your medication issue? Patient is calling stating when he tried to pick up his medications at the pharmacy they stated the refill was denied. Shawn Blanchard states he will be running out of the medications this week. Please advise.

## 2019-06-01 NOTE — Telephone Encounter (Signed)
Returned call to patient, he states he needs a new rx for furosemide and jardiance.    He states he is taking furosemide 20 mg BID (he has been cutting tablet in half).    Advised would send new rx, advised to contact PCP for Jardiance rx if possible since they prescribed and are following his DM.    Patient agreed.  Patient states he also needs surgery on his wrist, and plans to discuss this at upcoming visit with Dr. [name].

## 2019-06-03 ENCOUNTER — Other Ambulatory Visit: Payer: Self-pay | Admitting: *Deleted

## 2019-06-03 ENCOUNTER — Telehealth: Payer: Self-pay | Admitting: *Deleted

## 2019-06-03 ENCOUNTER — Other Ambulatory Visit: Payer: Self-pay

## 2019-06-03 DIAGNOSIS — Z7901 Long term (current) use of anticoagulants: Secondary | ICD-10-CM

## 2019-06-03 DIAGNOSIS — I951 Orthostatic hypotension: Secondary | ICD-10-CM

## 2019-06-03 DIAGNOSIS — I25119 Atherosclerotic heart disease of native coronary artery with unspecified angina pectoris: Secondary | ICD-10-CM

## 2019-06-03 NOTE — Progress Notes (Signed)
bme

## 2019-06-03 NOTE — Telephone Encounter (Signed)
Please set up for BMET and forward to pharmacy for review prior to clearance.

## 2019-06-03 NOTE — Telephone Encounter (Signed)
   [facility] Pre-operative Risk Assessment    Request for surgical clearance:  1. What type of surgery is being performed?  RIGHT CARPAL TUNNEL RELEASE   2. When is this surgery scheduled?  TBD   3. What type of clearance is required (medical clearance vs. Pharmacy clearance to hold med vs. Both)?  BOTH  4. Are there any medications that need to be held prior to surgery and how long? PRADAXA   5. Practice name and name of physician performing surgery?  [facility] / DR. GRAMMIG   6. What is your office phone number [phone]    7.   What is your office fax number [phone]  8.   Anesthesia type (None, local, MAC, general) ?  LOCAL   Shawn Blanchard 06/03/2019, 9:33 AM  _________________________________________________________________   (provider comments below)

## 2019-06-03 NOTE — Telephone Encounter (Signed)
Left voice mail to call back 

## 2019-06-03 NOTE — Telephone Encounter (Signed)
Pt is agreeable to plan of care for lab work to be done [employer]). Pt states he will go today. Lab work to be done at Dr. Joya Gaskins office in [place]. Pt thanked me for the call.

## 2019-06-03 NOTE — Telephone Encounter (Signed)
Pt takes Pradaxa for afib with CHADS2VASc score of 6 (age x2, CHF, HTN, DM, CAD). Pt has not had renal function checked since 2018. At that time, SCr was 1.28, CrCl 60 using adjusted body weight. Would recommend checking updated renal function as this will affect Pradaxa clearance. Pt may need 2 day Pradaxa hold instead of 1 day hold pending level of renal impairment.

## 2019-06-04 LAB — BASIC METABOLIC PANEL
BUN/Creatinine Ratio: 19 (ref 10–24)
BUN: 25 mg/dL (ref 8–27)
CO2: 22 mmol/L (ref 20–29)
Calcium: 9.2 mg/dL (ref 8.6–10.2)
Chloride: 103 mmol/L (ref 96–106)
Creatinine, Ser: 1.31 mg/dL — ABNORMAL HIGH (ref 0.76–1.27)
GFR calc Af Amer: 59 mL/min/{1.73_m2} — ABNORMAL LOW (ref >59)
GFR calc non Af Amer: 51 mL/min/{1.73_m2} — ABNORMAL LOW (ref >59)
Glucose: 197 mg/dL — ABNORMAL HIGH (ref 65–99)
Potassium: 4.8 mmol/L (ref 3.5–5.2)
Sodium: 141 mmol/L (ref 134–144)

## 2019-06-04 NOTE — Telephone Encounter (Signed)
SCr 1.31, CrCl 77 using actual body weight but is 59 using adjusted body weight. Based on renal function, recommend holding Pradaxa for 1.5 days (3 doses) prior to procedure per protocol.

## 2019-06-04 NOTE — Telephone Encounter (Signed)
   Primary Cardiologist: Shirlee More, MD  Chart reviewed as part of pre-operative protocol coverage. Given past medical history and time since last visit, based on ACC/AHA guidelines, Shawn Blanchard would be at acceptable risk for the planned procedure without further cardiovascular testing.   I will route this recommendation to the requesting party via Epic fax function and remove from pre-op pool.  Please call with questions.  [place], [place] 06/04/2019, 8:45 AM

## 2019-06-07 ENCOUNTER — Telehealth: Payer: Self-pay | Admitting: Cardiology

## 2019-06-07 NOTE — Telephone Encounter (Signed)
Shawn Blanchard is calling requesting we send his most recent labs performed on 06/03/19 to Dr. Cathlean Sauer. The fax number for his office is [phone].

## 2019-06-07 NOTE — Telephone Encounter (Signed)
Sent recent lab over via epic fax.

## 2019-06-13 DIAGNOSIS — N1831 Chronic kidney disease, stage 3a: Secondary | ICD-10-CM | POA: Insufficient documentation

## 2019-06-13 HISTORY — DX: Chronic kidney disease, stage 3a: N18.31

## 2019-06-17 NOTE — Progress Notes (Signed)
Cardiology Office Note:    Date:  06/18/2019   ID:  Shawn Blanchard, DOB October 22, 1935, MRN [id number]  PCP:  Algis Greenhouse, MD  Cardiologist:  Shirlee More, MD    Referring MD: Algis Greenhouse, MD    ASSESSMENT:    1. Chronic atrial fibrillation ([place])   2. Chronic anticoagulation   3. Orthostatic hypotension   4. Hypertensive heart disease with heart failure ([place])   5. Coronary artery disease involving native coronary artery of native heart with angina pectoris ([place])   6. Dyslipidemia    PLAN:    In order of problems listed above:  1. Stable rate controlled due to beta-blocker anticoagulant 2. Continue his anticoagulant, for carpal tunnel surgery of asked the patient to hold 2 days prior and usually 1 to 2 days afterwards determined by the surgeon 3. No orthostatic shift in blood pressure today 4. improved BP at target continue current treatment without orthostatic shift 5. Stable CAD continue medical therapy 6. Stable continue his current statin are ideal 7. Stable diabetes mellitus managed by his PCP he is on appropriate cardioprotective medications with type 2 diabetes   Next appointment: 6 months   Medication Adjustments/Labs and Tests Ordered: Current medicines are reviewed at length with the patient today.  Concerns regarding medicines are outlined above.  No orders of the defined types were placed in this encounter.  No orders of the defined types were placed in this encounter.   Chief Complaint  Patient presents with   Follow-up   Hypotension    History of Present Illness:    Shawn Blanchard is a 84 y.o. male with a hx of CAD, CHF, Atrial Fibrillation, Dyslipidemia, HTN, S/P PCI of LAD 2002 EF 47% in 2016 orthostatic hypotension and T2 DM with neuropathy last seen 01/25/2019.  Dramatic orthostatic hypotension is SGLT2 inhibitor was withdrawn and beta-blocker was decreased in dosage.  His event monitor subsequently shows well-controlled atrial  fibrillation without bradycardia Compliance with diet, lifestyle and medications: Yes  He continues to have marked unsteadiness shifting posture but no orthostatic hypotension and his monitor shows no significant bradycardia.  No edema shortness of breath palpitation or syncope he is walking 150 minutes a week.  I reviewed the results of the ZIO monitor with the patient during the visit Study Highlights A ZIO monitor was performed for 6 days 21 hours to assess atrial fibrillation.  The study was initiated 01/25/2019.  The rhythm throughout was atrial fibrillation.  Heart rate control was good.  Minimum average and maximum heart rates of 36, 60 to 121 bpm.  The slowest sustained atrial fibrillation was 49 bpm.  There were no pauses greater than 3 seconds.  Daytime heart rates are between 50 and 198% of the time, nighttime heart rates are between 50 and 192% of the time and less than 50 bpm 8% nocturnal time. Ventricular ectopy is rare with PVCs and couplets. There were no triggered or diary events  Conclusion atrial fibrillation throughout the recording with good heart rate control.  Rare ventricular ectopy is seen.    Past Medical History:  Diagnosis Date   Old MI (myocardial infarction)     Past Surgical History:  Procedure Laterality Date   CATARACT EXTRACTION Left    CERVICAL SPINE SURGERY     CHOLECYSTECTOMY     NASAL SINUS SURGERY     PARTIAL KNEE ARTHROPLASTY Bilateral    SMALL INTESTINE SURGERY     TONSILLECTOMY      Current Medications: Current  Meds  Medication Sig   allopurinol (ZYLOPRIM) 100 MG tablet Take 100 mg by mouth 2 (two) times daily as needed.    aspirin EC 81 MG tablet Take 81 mg by mouth daily.   cephALEXin (KEFLEX) 500 MG capsule Take 500 mg by mouth 2 (two) times daily.   furosemide (LASIX) 20 MG tablet Take 1 tablet (20 mg total) by mouth 2 (two) times daily.   glucose blood test strip OneTouch Ultra Blue Test Strip   JARDIANCE 25 MG TABS  tablet Take 1 tablet by mouth daily.   Lancets (ONETOUCH DELICA PLUS 123XX123) MISC OneTouch Delica Plus Lancet 33 gauge   MELATONIN PO Take 1 tablet by mouth daily as needed.   metoprolol succinate (TOPROL-XL) 50 MG 24 hr tablet TAKE 1 TABLET BY MOUTH EVERY DAY   nitroGLYCERIN (NITROSTAT) 0.4 MG SL tablet Place 1 tablet (0.4 mg total) under the tongue every 5 (five) minutes as needed for chest pain.   PRADAXA 150 MG CAPS capsule TAKE 1 CAPSULE BY MOUTH 2 TIMES DAILY   pramipexole (MIRAPEX) 0.25 MG tablet Take 0.25 mg by mouth 2 (two) times daily.   REPATHA SURECLICK XX123456 MG/ML SOAJ XX123456 Milligram(s) SUB-Q Every 2 Weeks   Saxagliptin-Metformin 2.08-998 MG TB24 Take 1 tablet by mouth 2 (two) times daily.    Turmeric POWD by Misc.(Non-Drug; Combo Route) route.     Allergies:   Atorvastatin, Sulfa antibiotics, Ace inhibitors, Amoxicillin, Meperidine, Naproxen sodium, Sulfamethoxazole, and Tetracycline   Social History   Socioeconomic History   Marital status: Married    Spouse name: Not on file   Number of children: Not on file   Years of education: Not on file   Highest education level: Not on file  Occupational History   Not on file  Tobacco Use   Smoking status: Never Smoker   Smokeless tobacco: Never Used  Substance and Sexual Activity   Alcohol use: Not Currently   Drug use: Never   Sexual activity: Not on file  Other Topics Concern   Not on file  Social History Narrative   Not on file   Social Determinants of Health   Financial Resource Strain:    Difficulty of Paying Living Expenses:   Food Insecurity:    Worried About Charity fundraiser in the Last Year:    Arboriculturist in the Last Year:   Transportation Needs:    Film/video editor (Medical):    Lack of Transportation (Non-Medical):   Physical Activity:    Days of Exercise per Week:    Minutes of Exercise per Session:   Stress:    Feeling of Stress :   Social  Connections:    Frequency of Communication with Friends and Family:    Frequency of Social Gatherings with Friends and Family:    Attends Religious Services:    Active Member of Clubs or Organizations:    Attends Music therapist:    Marital Status:      Family History: The patient's family history includes Cancer in his mother; Diabetes in his father and mother; Heart disease in his father and mother; Stroke in his father. ROS:   Please see the history of present illness.    All other systems reviewed and are negative.  EKGs/Labs/Other Studies Reviewed:    The following studies were reviewed today:   Recent Labs: Reviewed, from his primary care physician 06/17/2019: A1c 7.0% Hemoglobin 16.6  Cholesterol 147 HDL 33 LDL Mean 1.21  potassium 4.4  Physical Exam:    VS:  BP (!) 150/70    Pulse 66    Temp 97.9 F (36.6 C)    Ht 5\' 11"  (1.803 m)    Wt 277 lb (125.6 kg)    SpO2 95%    BMI 38.63 kg/m     Wt Readings from Last 3 Encounters:  06/18/19 277 lb (125.6 kg)  01/25/19 273 lb (123.8 kg)  11/07/17 164 lb (74.4 kg)    38/60 sitting standing GEN:  Well nourished, well developed in no acute distress HEENT: Normal NECK: No JVD; No carotid bruits LYMPHATICS: No lymphadenopathy CARDIAC: Irregular S1 variable RRR, no murmurs, rubs, gallops RESPIRATORY:  Clear to auscultation without rales, wheezing or rhonchi  ABDOMEN: Soft, non-tender, non-distended MUSCULOSKELETAL:  No edema; No deformity  SKIN: Warm and dry NEUROLOGIC:  Alert and oriented x 3 PSYCHIATRIC:  Normal affect    Signed, Shirlee More, MD  06/18/2019 10:45 AM    [place]

## 2019-06-18 ENCOUNTER — Encounter: Payer: Self-pay | Admitting: Cardiology

## 2019-06-18 ENCOUNTER — Other Ambulatory Visit: Payer: Self-pay

## 2019-06-18 ENCOUNTER — Ambulatory Visit (INDEPENDENT_AMBULATORY_CARE_PROVIDER_SITE_OTHER): Payer: Medicare PPO | Admitting: Cardiology

## 2019-06-18 VITALS — BP 150/70 | HR 66 | Temp 97.9°F | Ht 71.0 in | Wt 277.0 lb

## 2019-06-18 DIAGNOSIS — I482 Chronic atrial fibrillation, unspecified: Secondary | ICD-10-CM

## 2019-06-18 DIAGNOSIS — Z7901 Long term (current) use of anticoagulants: Secondary | ICD-10-CM

## 2019-06-18 DIAGNOSIS — I951 Orthostatic hypotension: Secondary | ICD-10-CM

## 2019-06-18 DIAGNOSIS — I25119 Atherosclerotic heart disease of native coronary artery with unspecified angina pectoris: Secondary | ICD-10-CM

## 2019-06-18 DIAGNOSIS — E785 Hyperlipidemia, unspecified: Secondary | ICD-10-CM

## 2019-06-18 DIAGNOSIS — I110 Hypertensive heart disease with heart failure: Secondary | ICD-10-CM

## 2019-06-18 NOTE — Patient Instructions (Addendum)
Medication Instructions:  Your physician recommends that you continue on your current medications as directed. Please refer to the Current Medication list given to you today.  *If you need a refill on your cardiac medications before your next appointment, please call your pharmacy*   Lab Work: None ordered  If you have labs (blood work) drawn today and your tests are completely normal, you will receive your results only by: . MyChart Message (if you have MyChart) OR . A paper copy in the mail If you have any lab test that is abnormal or we need to change your treatment, we will call you to review the results.   Testing/Procedures: None ordered   Follow-Up: At [facility], you and your health needs are our priority.  As part of our continuing mission to provide you with exceptional heart care, we have created designated Provider Care Teams.  These Care Teams include your primary Cardiologist (physician) and Advanced Practice Providers (APPs -  Physician Assistants and Nurse Practitioners) who all work together to provide you with the care you need, when you need it.  We recommend signing up for the patient portal called "MyChart".  Sign up information is provided on this After Visit Summary.  MyChart is used to connect with patients for Virtual Visits (Telemedicine).  Patients are able to view lab/test results, encounter notes, upcoming appointments, etc.  Non-urgent messages can be sent to your provider as well.   To learn more about what you can do with MyChart, go to [email or web address].    Your next appointment:   6 month(s)  The format for your next appointment:   In Person  Provider:   Brian Munley, MD   Other Instructions  

## 2019-08-17 ENCOUNTER — Other Ambulatory Visit: Payer: Self-pay | Admitting: Cardiology

## 2019-09-10 ENCOUNTER — Encounter: Payer: Self-pay | Admitting: Cardiology

## 2019-10-04 DIAGNOSIS — H903 Sensorineural hearing loss, bilateral: Secondary | ICD-10-CM | POA: Insufficient documentation

## 2019-10-04 HISTORY — DX: Sensorineural hearing loss, bilateral: H90.3

## 2019-10-05 DIAGNOSIS — H0259 Other disorders affecting eyelid function: Secondary | ICD-10-CM | POA: Insufficient documentation

## 2019-10-05 HISTORY — DX: Other disorders affecting eyelid function: H02.59

## 2019-12-02 ENCOUNTER — Telehealth: Payer: Self-pay | Admitting: Cardiology

## 2019-12-02 MED ORDER — CARVEDILOL 3.125 MG PO TABS: 3.1250 mg | ORAL_TABLET | Freq: Two times a day (BID) | ORAL | 3 refills | Status: DC

## 2019-12-02 NOTE — Telephone Encounter (Signed)
Patient would like to share some updated BP readings with Lilia Pro - did not want to read out to me.

## 2019-12-02 NOTE — Telephone Encounter (Signed)
Left message on patients voicemail to please return our call.   

## 2019-12-02 NOTE — Addendum Note (Signed)
Addended by: Resa Miner I on: 12/02/2019 03:56 PM   Modules accepted: Orders

## 2019-12-02 NOTE — Telephone Encounter (Signed)
Let us have him try carvedilol 3.125 mg twice a day.

## 2019-12-02 NOTE — Telephone Encounter (Signed)
Spoke with the patient just now and he let me know that he has been keeping a record of his blood pressure readings. He states that he stopped his metoprolol back in June due to having symptoms from it. He states that since stopping this metoprolol his symptoms have resolved but now his blood pressure is high again. The last week blood pressures have been around 170/75 per the patient. He is wondering if there is something else he could take for his blood pressure that hopefully would not cause him to have any symptoms.

## 2019-12-02 NOTE — Telephone Encounter (Signed)
Spoke to the patient just now and let him know these recommendations. He verbalizes understanding and thanks me for the call back.  

## 2019-12-14 ENCOUNTER — Inpatient Hospital Stay (HOSPITAL_COMMUNITY): Payer: Medicare PPO

## 2019-12-14 ENCOUNTER — Inpatient Hospital Stay (HOSPITAL_COMMUNITY)
Admission: EM | Admit: 2019-12-14 | Discharge: 2019-12-17 | DRG: 389 | Disposition: A | Discharge status: Home / Self Care | Payer: Medicare PPO | Attending: Internal Medicine | Admitting: Internal Medicine

## 2019-12-14 ENCOUNTER — Encounter (HOSPITAL_COMMUNITY): Payer: Self-pay | Admitting: Emergency Medicine

## 2019-12-14 ENCOUNTER — Other Ambulatory Visit: Payer: Self-pay

## 2019-12-14 ENCOUNTER — Emergency Department (HOSPITAL_COMMUNITY): Payer: Medicare PPO

## 2019-12-14 DIAGNOSIS — I5032 Chronic diastolic (congestive) heart failure: Secondary | ICD-10-CM | POA: Diagnosis not present

## 2019-12-14 DIAGNOSIS — I4821 Permanent atrial fibrillation: Secondary | ICD-10-CM

## 2019-12-14 DIAGNOSIS — I130 Hypertensive heart and chronic kidney disease with heart failure and stage 1 through stage 4 chronic kidney disease, or unspecified chronic kidney disease: Secondary | ICD-10-CM | POA: Diagnosis present

## 2019-12-14 DIAGNOSIS — Z20822 Contact with and (suspected) exposure to covid-19: Secondary | ICD-10-CM | POA: Diagnosis present

## 2019-12-14 DIAGNOSIS — R6510 Systemic inflammatory response syndrome (SIRS) of non-infectious origin without acute organ dysfunction: Secondary | ICD-10-CM | POA: Diagnosis present

## 2019-12-14 DIAGNOSIS — D72829 Elevated white blood cell count, unspecified: Secondary | ICD-10-CM | POA: Diagnosis present

## 2019-12-14 DIAGNOSIS — C8590 Non-Hodgkin lymphoma, unspecified, unspecified site: Secondary | ICD-10-CM | POA: Diagnosis present

## 2019-12-14 DIAGNOSIS — E1149 Type 2 diabetes mellitus with other diabetic neurological complication: Secondary | ICD-10-CM | POA: Diagnosis present

## 2019-12-14 DIAGNOSIS — I351 Nonrheumatic aortic (valve) insufficiency: Secondary | ICD-10-CM | POA: Diagnosis present

## 2019-12-14 DIAGNOSIS — Z79890 Hormone replacement therapy (postmenopausal): Secondary | ICD-10-CM

## 2019-12-14 DIAGNOSIS — Z8249 Family history of ischemic heart disease and other diseases of the circulatory system: Secondary | ICD-10-CM

## 2019-12-14 DIAGNOSIS — I252 Old myocardial infarction: Secondary | ICD-10-CM | POA: Diagnosis not present

## 2019-12-14 DIAGNOSIS — Z7982 Long term (current) use of aspirin: Secondary | ICD-10-CM

## 2019-12-14 DIAGNOSIS — Z7901 Long term (current) use of anticoagulants: Secondary | ICD-10-CM | POA: Diagnosis not present

## 2019-12-14 DIAGNOSIS — K56600 Partial intestinal obstruction, unspecified as to cause: Secondary | ICD-10-CM | POA: Diagnosis present

## 2019-12-14 DIAGNOSIS — E1142 Type 2 diabetes mellitus with diabetic polyneuropathy: Secondary | ICD-10-CM | POA: Diagnosis present

## 2019-12-14 DIAGNOSIS — N1831 Chronic kidney disease, stage 3a: Secondary | ICD-10-CM | POA: Diagnosis present

## 2019-12-14 DIAGNOSIS — J449 Chronic obstructive pulmonary disease, unspecified: Secondary | ICD-10-CM | POA: Diagnosis present

## 2019-12-14 DIAGNOSIS — Y848 Other medical procedures as the cause of abnormal reaction of the patient, or of later complication, without mention of misadventure at the time of the procedure: Secondary | ICD-10-CM | POA: Diagnosis not present

## 2019-12-14 DIAGNOSIS — I10 Essential (primary) hypertension: Secondary | ICD-10-CM | POA: Diagnosis present

## 2019-12-14 DIAGNOSIS — E872 Acidosis: Secondary | ICD-10-CM | POA: Diagnosis present

## 2019-12-14 DIAGNOSIS — Z9861 Coronary angioplasty status: Secondary | ICD-10-CM

## 2019-12-14 DIAGNOSIS — G4730 Sleep apnea, unspecified: Secondary | ICD-10-CM | POA: Diagnosis present

## 2019-12-14 DIAGNOSIS — Z882 Allergy status to sulfonamides status: Secondary | ICD-10-CM | POA: Diagnosis not present

## 2019-12-14 DIAGNOSIS — K56609 Unspecified intestinal obstruction, unspecified as to partial versus complete obstruction: Secondary | ICD-10-CM

## 2019-12-14 DIAGNOSIS — N179 Acute kidney failure, unspecified: Secondary | ICD-10-CM | POA: Diagnosis present

## 2019-12-14 DIAGNOSIS — E860 Dehydration: Secondary | ICD-10-CM | POA: Diagnosis present

## 2019-12-14 DIAGNOSIS — Z91013 Allergy to seafood: Secondary | ICD-10-CM

## 2019-12-14 DIAGNOSIS — E1122 Type 2 diabetes mellitus with diabetic chronic kidney disease: Secondary | ICD-10-CM | POA: Diagnosis present

## 2019-12-14 DIAGNOSIS — Z9221 Personal history of antineoplastic chemotherapy: Secondary | ICD-10-CM

## 2019-12-14 DIAGNOSIS — I2510 Atherosclerotic heart disease of native coronary artery without angina pectoris: Secondary | ICD-10-CM | POA: Diagnosis present

## 2019-12-14 DIAGNOSIS — E785 Hyperlipidemia, unspecified: Secondary | ICD-10-CM | POA: Diagnosis present

## 2019-12-14 DIAGNOSIS — K4020 Bilateral inguinal hernia, without obstruction or gangrene, not specified as recurrent: Secondary | ICD-10-CM | POA: Diagnosis present

## 2019-12-14 DIAGNOSIS — E1165 Type 2 diabetes mellitus with hyperglycemia: Secondary | ICD-10-CM | POA: Diagnosis present

## 2019-12-14 DIAGNOSIS — E1140 Type 2 diabetes mellitus with diabetic neuropathy, unspecified: Secondary | ICD-10-CM | POA: Diagnosis not present

## 2019-12-14 DIAGNOSIS — Z833 Family history of diabetes mellitus: Secondary | ICD-10-CM

## 2019-12-14 DIAGNOSIS — Z6839 Body mass index (BMI) 39.0-39.9, adult: Secondary | ICD-10-CM | POA: Diagnosis not present

## 2019-12-14 DIAGNOSIS — Z9049 Acquired absence of other specified parts of digestive tract: Secondary | ICD-10-CM

## 2019-12-14 DIAGNOSIS — Z96653 Presence of artificial knee joint, bilateral: Secondary | ICD-10-CM | POA: Diagnosis present

## 2019-12-14 DIAGNOSIS — K91840 Postprocedural hemorrhage and hematoma of a digestive system organ or structure following a digestive system procedure: Secondary | ICD-10-CM | POA: Diagnosis not present

## 2019-12-14 DIAGNOSIS — Z888 Allergy status to other drugs, medicaments and biological substances status: Secondary | ICD-10-CM

## 2019-12-14 DIAGNOSIS — Z79899 Other long term (current) drug therapy: Secondary | ICD-10-CM

## 2019-12-14 DIAGNOSIS — I482 Chronic atrial fibrillation, unspecified: Secondary | ICD-10-CM | POA: Diagnosis present

## 2019-12-14 DIAGNOSIS — I5042 Chronic combined systolic (congestive) and diastolic (congestive) heart failure: Secondary | ICD-10-CM | POA: Diagnosis present

## 2019-12-14 DIAGNOSIS — Z0189 Encounter for other specified special examinations: Secondary | ICD-10-CM

## 2019-12-14 DIAGNOSIS — Z823 Family history of stroke: Secondary | ICD-10-CM

## 2019-12-14 HISTORY — DX: Unspecified intestinal obstruction, unspecified as to partial versus complete obstruction: K56.609

## 2019-12-14 LAB — URINALYSIS, ROUTINE W REFLEX MICROSCOPIC
Bilirubin Urine: NEGATIVE
Glucose, UA: >=500 mg/dL — AB
Ketones, ur: 5 mg/dL — AB
Nitrite: NEGATIVE
Protein, ur: NEGATIVE mg/dL
Specific Gravity, Urine: 1.027 (ref 1.005–1.030)
pH: 5 (ref 5.0–8.0)

## 2019-12-14 LAB — CBC
HCT: 57 % — ABNORMAL HIGH (ref 39.0–52.0)
Hemoglobin: 18.4 g/dL — ABNORMAL HIGH (ref 13.0–17.0)
MCH: 28 pg (ref 26.0–34.0)
MCHC: 32.3 g/dL (ref 30.0–36.0)
MCV: 86.8 fL (ref 80.0–100.0)
Platelets: 254 10*3/uL (ref 150–400)
RBC: 6.57 MIL/uL — ABNORMAL HIGH (ref 4.22–5.81)
RDW: 14.9 % (ref 11.5–15.5)
WBC: 13.1 10*3/uL — ABNORMAL HIGH (ref 4.0–10.5)
nRBC: 0 % (ref 0.0–0.2)

## 2019-12-14 LAB — LIPASE, BLOOD: Lipase: 27 U/L (ref 11–51)

## 2019-12-14 LAB — COMPREHENSIVE METABOLIC PANEL
ALT: 21 U/L (ref 0–44)
AST: 24 U/L (ref 15–41)
Albumin: 4.5 g/dL (ref 3.5–5.0)
Alkaline Phosphatase: 57 U/L (ref 38–126)
Anion gap: 17 — ABNORMAL HIGH (ref 5–15)
BUN: 29 mg/dL — ABNORMAL HIGH (ref 8–23)
CO2: 27 mmol/L (ref 22–32)
Calcium: 9.8 mg/dL (ref 8.9–10.3)
Chloride: 93 mmol/L — ABNORMAL LOW (ref 98–111)
Creatinine, Ser: 1.56 mg/dL — ABNORMAL HIGH (ref 0.61–1.24)
GFR calc Af Amer: 48 mL/min — ABNORMAL LOW (ref >60)
GFR calc non Af Amer: 41 mL/min — ABNORMAL LOW (ref >60)
Glucose, Bld: 252 mg/dL — ABNORMAL HIGH (ref 70–99)
Potassium: 4.2 mmol/L (ref 3.5–5.1)
Sodium: 137 mmol/L (ref 135–145)
Total Bilirubin: 1.6 mg/dL — ABNORMAL HIGH (ref 0.3–1.2)
Total Protein: 8 g/dL (ref 6.5–8.1)

## 2019-12-14 LAB — CBG MONITORING, ED
Glucose-Capillary: 195 mg/dL — ABNORMAL HIGH (ref 70–99)
Glucose-Capillary: 235 mg/dL — ABNORMAL HIGH (ref 70–99)

## 2019-12-14 LAB — LACTIC ACID, PLASMA: Lactic Acid, Venous: 2.2 mmol/L (ref 0.5–1.9)

## 2019-12-14 LAB — HEMOGLOBIN A1C
Hgb A1c MFr Bld: 7.1 % — ABNORMAL HIGH (ref 4.8–5.6)
Mean Plasma Glucose: 157.07 mg/dL

## 2019-12-14 LAB — SARS CORONAVIRUS 2 BY RT PCR (HOSPITAL ORDER, PERFORMED IN ~~LOC~~ HOSPITAL LAB): SARS Coronavirus 2: NEGATIVE

## 2019-12-14 LAB — BETA-HYDROXYBUTYRIC ACID: Beta-Hydroxybutyric Acid: 0.5 mmol/L — ABNORMAL HIGH (ref 0.05–0.27)

## 2019-12-14 LAB — PROTIME-INR
INR: 1.3 — ABNORMAL HIGH (ref 0.8–1.2)
Prothrombin Time: 16.1 seconds — ABNORMAL HIGH (ref 11.4–15.2)

## 2019-12-14 LAB — APTT: aPTT: 52 seconds — ABNORMAL HIGH (ref 24–36)

## 2019-12-14 MED ORDER — ONDANSETRON HCL 4 MG PO TABS: 4.0000 mg | ORAL_TABLET | Freq: Four times a day (QID) | ORAL | Status: DC | PRN

## 2019-12-14 MED ORDER — PROMETHAZINE HCL 25 MG/ML IJ SOLN: 12.5000 mg | Freq: Once | INTRAMUSCULAR | Status: DC

## 2019-12-14 MED ORDER — MORPHINE SULFATE (PF) 4 MG/ML IV SOLN
4.0000 mg | Freq: Once | INTRAVENOUS | Status: AC
  Administered 2019-12-14: 4 mg via INTRAVENOUS
  Filled 2019-12-14: qty 1

## 2019-12-14 MED ORDER — METOPROLOL TARTRATE 5 MG/5ML IV SOLN
5.0000 mg | Freq: Two times a day (BID) | INTRAVENOUS | Status: DC
  Administered 2019-12-15 – 2019-12-17 (×5): 5 mg via INTRAVENOUS
  Filled 2019-12-14 (×6): qty 5

## 2019-12-14 MED ORDER — HEPARIN (PORCINE) 25000 UT/250ML-% IV SOLN
1500.0000 [IU]/h | INTRAVENOUS | Status: DC
  Administered 2019-12-14: 1500 [IU]/h via INTRAVENOUS
  Filled 2019-12-14: qty 250

## 2019-12-14 MED ORDER — SODIUM CHLORIDE 0.9 % IV BOLUS (SEPSIS)
500.0000 mL | Freq: Once | INTRAVENOUS | Status: AC
  Administered 2019-12-14: 500 mL via INTRAVENOUS

## 2019-12-14 MED ORDER — ONDANSETRON HCL 4 MG/2ML IJ SOLN
4.0000 mg | Freq: Once | INTRAMUSCULAR | Status: AC
  Administered 2019-12-14: 4 mg via INTRAVENOUS
  Filled 2019-12-14: qty 2

## 2019-12-14 MED ORDER — IOHEXOL 300 MG/ML  SOLN
100.0000 mL | Freq: Once | INTRAMUSCULAR | Status: AC | PRN
  Administered 2019-12-14: 100 mL via INTRAVENOUS

## 2019-12-14 MED ORDER — INSULIN GLARGINE 100 UNIT/ML ~~LOC~~ SOLN
10.0000 [IU] | Freq: Every day | SUBCUTANEOUS | Status: DC
  Administered 2019-12-14 – 2019-12-17 (×3): 10 [IU] via SUBCUTANEOUS
  Filled 2019-12-14 (×4): qty 0.1

## 2019-12-14 MED ORDER — SODIUM CHLORIDE 0.9% FLUSH
3.0000 mL | Freq: Two times a day (BID) | INTRAVENOUS | Status: DC
  Administered 2019-12-15 – 2019-12-16 (×3): 3 mL via INTRAVENOUS

## 2019-12-14 MED ORDER — SODIUM CHLORIDE 0.9 % IV SOLN: INTRAVENOUS | Status: AC

## 2019-12-14 MED ORDER — ONDANSETRON HCL 4 MG/2ML IJ SOLN: 4.0000 mg | Freq: Four times a day (QID) | INTRAMUSCULAR | Status: DC | PRN

## 2019-12-14 MED ORDER — HYDROMORPHONE HCL 1 MG/ML IJ SOLN
0.5000 mg | INTRAMUSCULAR | Status: DC | PRN
  Administered 2019-12-14 – 2019-12-15 (×3): 0.5 mg via INTRAVENOUS
  Filled 2019-12-14 (×3): qty 1

## 2019-12-14 MED ORDER — SODIUM CHLORIDE 0.9 % IV SOLN: 1000.0000 mL | INTRAVENOUS | Status: DC

## 2019-12-14 MED ORDER — DIATRIZOATE MEGLUMINE & SODIUM 66-10 % PO SOLN
90.0000 mL | Freq: Once | ORAL | Status: AC
  Administered 2019-12-14: 90 mL via NASOGASTRIC
  Filled 2019-12-14: qty 90

## 2019-12-14 MED ORDER — FUROSEMIDE 10 MG/ML IJ SOLN
20.0000 mg | Freq: Every day | INTRAMUSCULAR | Status: DC
  Administered 2019-12-14 – 2019-12-15 (×2): 20 mg via INTRAVENOUS
  Filled 2019-12-14 (×2): qty 4

## 2019-12-14 MED ORDER — INSULIN ASPART 100 UNIT/ML ~~LOC~~ SOLN
0.0000 [IU] | Freq: Three times a day (TID) | SUBCUTANEOUS | Status: DC
  Administered 2019-12-15 (×2): 3 [IU] via SUBCUTANEOUS
  Administered 2019-12-15: 5 [IU] via SUBCUTANEOUS
  Administered 2019-12-16 – 2019-12-17 (×4): 2 [IU] via SUBCUTANEOUS
  Administered 2019-12-17: 3 [IU] via SUBCUTANEOUS
  Filled 2019-12-14: qty 0.15

## 2019-12-14 MED ORDER — PROMETHAZINE HCL 25 MG/ML IJ SOLN
12.5000 mg | Freq: Once | INTRAMUSCULAR | Status: AC
  Administered 2019-12-14: 12.5 mg via INTRAVENOUS
  Filled 2019-12-14: qty 1

## 2019-12-14 NOTE — ED Triage Notes (Signed)
Per pt, states he thinks he might have a blockage-states abdominal distention and pain since yesterday-states he had a normal BM yesterday-no N/V

## 2019-12-14 NOTE — Progress Notes (Signed)
ANTICOAGULATION CONSULT NOTE - Initial Consult  Pharmacy Consult for IV heparin Indication: atrial fibrillation  Allergies  Allergen Reactions  . Atorvastatin Other (See Comments)    Dizziness.  . Sulfa Antibiotics   . Ace Inhibitors Rash  . Amoxicillin Rash  . Meperidine Rash  . Naproxen Sodium Rash  . Sulfamethoxazole Rash  . Tetracycline Rash    Patient Measurements: Height: 5\' 10"  (177.8 cm) Weight: 125.6 kg (277 lb) IBW/kg (Calculated) : 73 Heparin Dosing Weight: 101.6 kg  Vital Signs: Temp: 98.4 F (36.9 C) (09/07 1119) Temp Source: Oral (09/07 1119) BP: 168/96 (09/07 1334) Pulse Rate: 73 (09/07 1334)  Labs: Recent Labs    12/14/19 0800  HGB 18.4*  HCT 57.0*  PLT 254  CREATININE 1.56*    Estimated Creatinine Clearance: 49.4 mL/min (A) (by C-G formula based on SCr of 1.56 mg/dL (H)).   Medical History: Past Medical History:  Diagnosis Date  . Old MI (myocardial infarction)     Medications:  PTA Pradaxa 150mg  PO BID-last dose 12/13/19 at 1930  Assessment: 83 y/oM on Pradaxa PTA for a-fib admitted for recurrent SBO vs. partial obstruction. Pharmacy consulted to start IV heparin infusion while patient NPO. CBC: Hgb 18.4, Pltc WNL at 254K. PT/INR 16.1/1.3, aPTT 52 seconds.   Goal of Therapy:  Heparin level 0.3-0.7 units/ml Monitor platelets by anticoagulation protocol: Yes   Plan:   Start heparin infusion at 1500 units/hr (no initial bolus due to recent Pradaxa dose)  Heparin level 8 hours after initiation   Daily CBC, heparin level  Monitor closely for s/sx of bleeding   Lindell Spar, PharmD, BCPS Clinical Pharmacist  12/14/2019,2:50 PM

## 2019-12-14 NOTE — ED Provider Notes (Addendum)
[facility] DEPT Provider Note   CSN: [id number] Arrival date & time: 12/14/19  8592     History Chief Complaint  Patient presents with  . Abdominal Pain    Shawn Blanchard is a 84 y.o. male.  HPI 84 year old male with a history of SBO's, CHF with an unknown EF, obesity, pulmonary hypertension, CAD, DM type II presents to the ER with complaints of lower abdominal pain, nausea and dry heaving x2 days.  Patient states that he has a history of small bowel obstructions and these symptoms feel similar.  He states that he has been having regular bowel movements without any hematochezia but has been having significant lower abdominal pain and distention along with nausea and dry heaving.  States he "cannot vomit".  His endorsed poor p.o. intake.  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  He denies any fevers or chills.  Denies any urinary symptoms.  Denies any chest pain or shortness of breath.    Past Medical History:  Diagnosis Date  . Old MI (myocardial infarction)     Patient Active Problem List   Diagnosis Date Noted  . SBO (small bowel obstruction) ([place]) 12/14/2019  . Bilateral hand pain 03/25/2019  . Carpal tunnel syndrome of right wrist 03/25/2019  . Purulent inflammation of skin 12/29/2018  . Diabetic polyneuropathy associated with type 2 diabetes mellitus ([place]) 12/16/2018  . Blepharitis of both upper and lower eyelid 10/02/2018  . Dermatochalasis of right upper eyelid 10/02/2018  . Long term current use of oral hypoglycemic drug 10/02/2018  . Meibomian gland dysfunction (MGD) of both eyes 10/02/2018  . Essential hypertension 11/07/2017  . Acute on chronic systolic congestive heart failure ([place]) 11/07/2017  . Dyslipidemia 11/07/2017  . Orthostatic dizziness 09/15/2017  . Restrictive lung disease  12/26/2016  . Dermatochalasis of both upper eyelids 11/21/2016  . Chronic anticoagulation 10/11/2016  . Lightheadedness 10/11/2016  . Male erectile disorder 06/17/2016  . Senile nuclear sclerosis 05/02/2016  . Hyperlipidemia 04/11/2016  . Small bowel obstruction ([place]) 04/11/2016  . Obesity 04/11/2016  . Leukocytosis 04/11/2016  . Arthritis 03/12/2016  . Pseudophakia of both eyes 03/12/2016  . Insomnia 11/27/2015  . Ganglion cyst 11/27/2015  . Chronic diastolic heart failure ([place]) 09/01/2015  . 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 Obstructive sleep apnea 08/30/2015  . Pulmonary hypertension ([place]) 08/30/2015  . Restless leg syndrome 08/30/2015  . SOB (shortness of breath) 08/14/2015  . Chronic atrial fibrillation ([place]) 06/12/2015  . 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 exacerbation ([place]) 06/12/2015  . Unilateral inguinal hernia without obstruction or gangrene 06/12/2015  . Gastroesophageal reflux disease without esophagitis 06/12/2015  . Permanent atrial fibrillation ([place]) 06/12/2015  . Idiopathic chronic gout 05/17/2015  . Coronary artery disease involving native coronary artery of native heart with angina pectoris ([place]) 02/15/2015  . Hypertensive heart disease with heart failure ([place]) 02/15/2015  . MI (myocardial infarction) ([place]) 02/15/2015  . Type 2 diabetes mellitus with neurologic complication, without long-term current use of insulin ([place]) 12/02/2014    Past Surgical History:  Procedure Laterality Date  . CATARACT EXTRACTION Left   . CERVICAL SPINE SURGERY    . CHOLECYSTECTOMY    . NASAL SINUS SURGERY    . PARTIAL KNEE ARTHROPLASTY Bilateral   . SMALL INTESTINE SURGERY    . TONSILLECTOMY  Family History  Problem Relation Age of Onset  . Cancer Mother   . Diabetes Mother   . Heart disease Mother   . Heart disease Father   . Stroke Father   . Diabetes Father     Social History   Tobacco  Use  . Smoking status: Never Smoker  . Smokeless tobacco: Never Used  Vaping Use  . Vaping Use: Never used  Substance Use Topics  . Alcohol use: Not Currently  . Drug use: Never    Home Medications Prior to Admission medications   Medication Sig Start Date End Date Taking? Authorizing Provider  acetaminophen (TYLENOL) 325 MG tablet Take 325 mg by mouth every 6 (six) hours as needed for mild pain or headache.   Yes [provider]  aspirin EC 81 MG tablet Take 81 mg by mouth daily.   Yes [provider]  carvedilol (COREG) 3.125 MG tablet Take 1 tablet (3.125 mg total) by mouth 2 (two) times daily with a meal. 12/02/19  Yes Tobb, Kardie, DO  furosemide (LASIX) 20 MG tablet Take 1 tablet (20 mg total) by mouth 2 (two) times daily. 06/01/19  Yes Richardo Priest, MD  JARDIANCE 25 MG TABS tablet Take 25 mg by mouth daily.  05/20/17  Yes [provider]  KOMBIGLYZE XR 08-998 MG TB24 Take 1 tablet by mouth every morning. 11/11/19  Yes [provider]  melatonin 3 MG TABS tablet Take 3 mg by mouth at bedtime as needed (sleep).   Yes [provider]  polyvinyl alcohol (LIQUIFILM TEARS) 1.4 % ophthalmic solution Place 1 drop into both eyes as needed for dry eyes.   Yes [provider]  PRADAXA 150 MG CAPS capsule TAKE 1 CAPSULE BY MOUTH 2 TIMES DAILY Patient taking differently: Take 150 mg by mouth 2 (two) times daily.  08/17/19  Yes Richardo Priest, MD  pramipexole (MIRAPEX) 0.25 MG tablet Take 0.25 mg by mouth 2 (two) times daily.   Yes [provider]  Probiotic Product (PROBIOTIC PO) Take 1 capsule by mouth daily.   Yes [provider]  REPATHA SURECLICK 417 MG/ML SOAJ Inject 140 mg into the skin 2 (two) times a week.  05/17/19  Yes [provider]  allopurinol (ZYLOPRIM) 100 MG tablet Take 100 mg by mouth 2 (two) times daily as needed (gout).  11/08/14   [provider]  glucose blood test strip OneTouch Ultra [employer]    [provider]  Lancets (ONETOUCH DELICA PLUS [id number]) [place] OneTouch Delica Plus Lancet 33 gauge    [provider]  metoprolol succinate (TOPROL-XL) 50 MG 24 hr tablet TAKE 1 TABLET BY MOUTH EVERY DAY Patient not taking: Reported on 12/14/2019 04/13/19   Richardo Priest, MD  nitroGLYCERIN (NITROSTAT) 0.4 MG SL tablet Place 1 tablet (0.4 mg total) under the tongue every 5 (five) minutes as needed for chest pain. 04/14/17   Richardo Priest, MD    Allergies    Atorvastatin, Sulfa antibiotics, Ace inhibitors, Amoxicillin, Meperidine, Naproxen sodium, Sulfamethoxazole, and Tetracycline  Review of Systems   Review of Systems  Constitutional: Negative for chills and fever.  HENT: Negative for ear pain and sore throat.   Eyes: Negative for pain and visual disturbance.  Respiratory: Negative for cough and shortness of breath.   Cardiovascular: Negative for chest pain and palpitations.  Gastrointestinal: Positive for abdominal pain and nausea. Negative for anal bleeding, blood in stool, diarrhea and vomiting.  Genitourinary: Negative  for dysuria and hematuria.  Musculoskeletal: Negative for arthralgias and back pain.  Skin: Negative for color change and rash.  Neurological: Negative for seizures and syncope.  All other systems reviewed and are negative.   Physical Exam Updated Vital Signs BP (!) 168/96 (BP Location: Right Arm)   Pulse 73   Temp 98.4 F (36.9 C) (Oral)   Resp (!) 28   Ht 5\' 10"  (1.778 m)   Wt 125.6 kg   SpO2 96%   BMI 39.75 kg/m   Physical Exam Vitals and nursing note reviewed.  Constitutional:      General: He is not in acute distress.    Appearance: He is well-developed. He is obese. He is not ill-appearing, toxic-appearing or diaphoretic.  HENT:     Head: Normocephalic and atraumatic.  Eyes:     Conjunctiva/sclera: Conjunctivae normal.  Cardiovascular:     Rate and Rhythm: Normal rate and regular rhythm.     Heart sounds: Normal  heart sounds. No murmur heard.   Pulmonary:     Effort: Pulmonary effort is normal. No respiratory distress.     Breath sounds: Normal breath sounds.  Abdominal:     General: Abdomen is protuberant. Bowel sounds are normal. There is distension.     Palpations: Abdomen is soft.     Tenderness: There is generalized abdominal tenderness. There is no right CVA tenderness, left CVA tenderness or guarding. Negative signs include Murphy's sign and McBurney's sign.     Hernia: A hernia is present. Hernia is present in the umbilical area.  Musculoskeletal:     Cervical back: Neck supple.  Skin:    General: Skin is warm and dry.     Findings: No erythema or rash.  Neurological:     General: No focal deficit present.     Mental Status: He is alert.  Psychiatric:        Mood and Affect: Mood normal.        Behavior: Behavior normal.     ED Results / Procedures / Treatments   Labs (all labs ordered are listed, but only abnormal results are displayed) Labs Reviewed  COMPREHENSIVE METABOLIC PANEL - Abnormal; Notable for the following components:      Result Value   Chloride 93 (*)    Glucose, Bld 252 (*)    BUN 29 (*)    Creatinine, Ser 1.56 (*)    Total Bilirubin 1.6 (*)    GFR calc non Af Amer 41 (*)    GFR calc Af Amer 48 (*)    Anion gap 17 (*)    All other components within normal limits  CBC - Abnormal; Notable for the following components:   WBC 13.1 (*)    RBC 6.57 (*)    Hemoglobin 18.4 (*)    HCT 57.0 (*)    All other components within normal limits  URINALYSIS, ROUTINE W REFLEX MICROSCOPIC - Abnormal; Notable for the following components:   Glucose, UA >=500 (*)    Hgb urine dipstick MODERATE (*)    Ketones, ur 5 (*)    Leukocytes,Ua MODERATE (*)    Bacteria, UA RARE (*)    All other components within normal limits  SARS CORONAVIRUS 2 BY RT PCR [facility] ORDER, [place] LAB)  LIPASE, BLOOD  BETA-HYDROXYBUTYRIC ACID  LACTIC ACID, PLASMA     EKG EKG Interpretation  Date/Time:  Tuesday December 14 2019 11:18:23 EDT Ventricular Rate:  102 PR Interval:    QRS  Duration: 120 QT Interval:  381 QTC Calculation: 448 R Axis:   -56 Text Interpretation: Atrial fibrillation Nonspecific IVCD with LAD Anterior infarct, old Nonspecific T abnormalities, lateral leads Since last tracing is now in Atrial Fibrillation Otherwise no significant change Confirmed by Daleen Bo [phone]) on 12/14/2019 11:46:08 AM   Radiology CT ABDOMEN PELVIS W CONTRAST  Result Date: 12/14/2019 CLINICAL DATA:  Acute nonlocalized abdominal pain EXAM: CT ABDOMEN AND PELVIS WITH CONTRAST TECHNIQUE: Multidetector CT imaging of the abdomen and pelvis was performed using the standard protocol following bolus administration of intravenous contrast. CONTRAST:  180mL OMNIPAQUE IOHEXOL 300 MG/ML  SOLN COMPARISON:  April 10, 2016 FINDINGS: Lower chest: Signs of calcified coronary artery disease and aortic valvular calcification similar to the prior study. No effusion. No consolidation. Hepatobiliary: Hepatic steatosis. Post cholecystectomy. No focal, suspicious hepatic lesion. Portal vein is patent. Pancreas: Pancreas is normal without ductal dilation or inflammation. Spleen: Spleen normal in size and contour. Adrenals/Urinary Tract: Adrenal glands are normal. Symmetric renal enhancement.  No hydronephrosis. Stomach/Bowel: Signs of previous small-bowel resection with evidence of proximal small bowel distension, degree of distension similar to 30-May-2016. Mild perienteric stranding an subtle mesenteric fluid less pronounced than in 05/30/2016 in terms of the stranding that is present. There is a transition point at a similar location in the RIGHT mid abdomen (image 55, series 3) decompressed small bowel loops beyond this level. Abundant stool does however remain mainly within the distal colon. Signs of colonic diverticulosis without diverticular inflammation.  Vascular/Lymphatic: Aortic atherosclerosis both calcified and noncalcified plaque. No aneurysm. No adenopathy. Vessels grossly patent on venous phase assessment. Portal vein is patent. Reproductive: Nonspecific appearance of prostate is unremarkable. Other: Moderate-size fat containing RIGHT inguinal hernia and small fat containing LEFT inguinal hernia. Musculoskeletal: Spinal degenerative changes. No acute or destructive bone process. IMPRESSION: 1. Early complete versus partial small bowel obstruction with similar appearance to study of April 10, 2016 showing signs of enteritis and evidence of prior small-bowel resection. Transition point at a similar location in the RIGHT hemiabdomen. 2. Hepatic steatosis. 3. Diverticulosis 4. Moderate-size fat containing RIGHT inguinal hernia and small fat containing LEFT inguinal hernia. 5. Signs of calcified coronary artery disease and aortic valvular calcification similar to the prior study. 6. Aortic atherosclerosis. Aortic Atherosclerosis (ICD10-I70.0). Electronically Signed   By: Zetta Bills M.D.   On: 12/14/2019 12:14    Procedures Procedures (including critical care time) CRITICAL CARE Performed by: Polo Riley   Total critical care time: 40 minutes  Critical care time was exclusive of separately billable procedures and treating other patients.  Critical care was necessary to treat or prevent imminent or life-threatening deterioration.  Critical care was time spent personally by me on the following activities: development of treatment plan with patient and/or surrogate as well as nursing, discussions with consultants, evaluation of patient's response to treatment, examination of patient, obtaining history from patient or surrogate, ordering and performing treatments and interventions, ordering and review of laboratory studies, ordering and review of radiographic studies, pulse oximetry and re-evaluation of patient's condition.  Medications Ordered  in ED Medications  sodium chloride 0.9 % bolus 500 mL (500 mLs Intravenous New Bag/Given 12/14/19 1156)    Followed by  0.9 %  sodium chloride infusion ( Intravenous Rate/Dose Change 12/14/19 1244)  morphine 4 MG/ML injection 4 mg (4 mg Intravenous Given 12/14/19 1100)  ondansetron (ZOFRAN) injection 4 mg (4 mg Intravenous Given 12/14/19 1101)  iohexol (OMNIPAQUE) 300 MG/ML solution 100  mL (100 mLs Intravenous Contrast Given 12/14/19 1143)  morphine 4 MG/ML injection 4 mg (4 mg Intravenous Given 12/14/19 1323)  promethazine (PHENERGAN) injection 12.5 mg (12.5 mg Intravenous Given 12/14/19 1323)    ED Course  I have reviewed the triage vital signs and the nursing notes.  Pertinent labs & imaging results that were available during my care of the patient were reviewed by me and considered in my medical decision making (see chart for details).    MDM Rules/Calculators/A&P                         84 year old male with increasing abdominal pain and nausea x 2 days w/ concerns for recurrent SBO  On presentation, abdomen is distended, globally tender, worse in the lower abdomen. Pt reports bowel movements are regular but endorses nausea and dry heaving. Vitals stable.   DDx includes SBO, diverticulitis, gastroenteritis, Covid  I personally reviewed and interpreted his labs      CBC with leukocytosis of 13.1, hemoglobin of 18.4 CMP with a chloride of 93, glucose of 252, creatinine 1.56 which is slightly elevated from baseline.  GFR 41.  Mild anion gap of 17, likely in the setting of vomiting.   UA with moderate hemoglobin, glucose urea of more than 500, moderate leukocytes, rare bacteria, however patient is denying any urinary symptoms at this time  IMAGING:  CT of the abdomen with early versus partial small bowel obstruction with similar appearance to the study done in 2018.  Questionable enteritis with evidence of small bowel resection.  MDM: Patient with evidence of an early or partial small bowel  obstruction, treated with Zofran, fluids, pain medication.  Consulted general surgery, who saw and evaluated the patient and recommended an NG tube.  Consulted hospitalist Dr. Neysa Bonito who will see and admit the patient for further evaluation and treatment.  DISPOSITION: Admission to hospitalist team w/ general surgery following. Hemodynamically stable throughout the ED course   Final Clinical Impression(s) / ED Diagnoses Final diagnoses:  Small bowel obstruction [facility])    Rx / DC Orders ED Discharge Orders    None       Lyndel Safe 12/14/19 1430    Lucrezia Starch, MD 12/15/19 2141    Garald Balding, PA-C 12/28/19 1510    Lucrezia Starch, MD 12/28/19 1546

## 2019-12-14 NOTE — ED Notes (Signed)
NG tube advanced 8cm. Obtaining new KUB prior to administering Gastrografin.

## 2019-12-14 NOTE — Consult Note (Signed)
[facility] Surgery Consult Note  Shawn Blanchard [birth year]  [id number].    Requesting MD: Madalyn Rob  Chief Complaint: abdominal distension & pain over 24 hours  Reason for Consult: recurrent SBO   HPI:  *Patient is an 84 year old male with a history of lymphoma.  He was treated with chemotherapy,  he underwent exploratory laparotomy small bowel resection secondary to his lymphoma, open lysis of adhesions, cholecystectomy.  He cannot tell me when this occurred, or where it occurred. He has also had a cervical laminectomy, ORIF both lower extremities and knee replacement x2.  Patient presented to the ED today with abdominal distention and pain since yesterday, he had a normal BM yesterday, no nausea or vomiting.  His abdominal discomfort and distention are similar to prior SBO's and he presented to the ED for evaluation. Work-up in the ED shows he is afebrile and hypertensive.  His heart rate has ranged from 83-101.  CMP shows a chloride of 93, potassium of 4.2, glucose of 252, creatinine 1.56, anion gap is 17 lipase is 27 AST 24, ALT 21, total bilirubin 1.6.  GFR 41, WBC 13.6, hemoglobin 18.4, hematocrit 57.0, platelets 254,000.  Urinalysis shows 21-50 WBC per high-powered field, 11-20 RBC per high-powered field nitrate is negative.  EKG shows atrial fibrillation with an intraventricular conduction delay.  CT scan with contrast shows: Calcified coronary artery disease and aortic valvular calcifications.  Hepatic steatosis, s/p cholecystectomy.  Signs of previous small bowel resection with evidence of proximal small bowel distention screw distention similar to 2018.  Findings are consistent with an early partial versus complete small bowel obstruction questionable enteritis.  Moderate fat-containing right inguinal hernia, and small fat-containing left inguinal hernia.  Additional PMH include:Hx MI (myocardial infarction)/CAD, Hx atrial fibrillation on Pradaxa, Hx type 2 diabetes, Stage  III chronic kidney disease, COPD/sleep apnea, Hx hypertension with heart failure - Dr. Shirlee More, Hx orthostatic hypotension, Morbid obesity, Dyslipidemia.  He cannot remember when he had his last Pradaxa, but most likely on Saturday, or Sunday.   We are asked to see.       ROS: ROS  Family History  Problem Relation Age of Onset  . Cancer Mother   . Diabetes Mother   . Heart disease Mother   . Heart disease Father   . Stroke Father   . Diabetes Father     Past Medical History:  Diagnosis Date  . Old MI (myocardial infarction) : Small bowel Hx lymphoma was noted 84 year old male Hx type 2 diabetes Hx atrial fibrillation on Pradaxa Stage III chronic kidney disease COPD/sleep apnea Hx hypertension with heart failure - Dr. Shirlee More Hx orthostatic hypotension Morbid obesity Dyslipidemia      Past Surgical History:  Procedure Laterality Date  . CATARACT EXTRACTION Left   . CERVICAL SPINE SURGERY    . CHOLECYSTECTOMY    . NASAL SINUS SURGERY    . PARTIAL KNEE ARTHROPLASTY Bilateral   . SMALL INTESTINE SURGERY  exploratory laparotomy small bowel resection secondary to his lymphoma, open lysis of adhesions,    . TONSILLECTOMY      Social History:  reports that he has never smoked. He has never used smokeless tobacco. He reports previous alcohol use. He reports that he does not use drugs. Prior to Admission medications   Medication Sig Start Date End Date Taking? Authorizing Provider  acetaminophen (TYLENOL) 325 MG tablet Take 325 mg by mouth every 6 (six) hours as needed for mild pain or headache.   Yes [provider]  aspirin EC 81 MG tablet Take 81 mg by mouth daily.   Yes [provider]  carvedilol (COREG) 3.125 MG tablet Take 1 tablet (3.125 mg total) by mouth 2 (two) times daily with a meal. 12/02/19  Yes Tobb, Kardie, DO  furosemide (LASIX) 20 MG tablet Take 1 tablet (20 mg total) by mouth 2 (two) times daily. 06/01/19  Yes Richardo Priest, MD  JARDIANCE 25 MG TABS tablet Take 25 mg by mouth daily.  05/20/17  Yes [provider]  KOMBIGLYZE XR 08-998 MG TB24 Take 1 tablet by mouth every morning. 11/11/19  Yes [provider]  melatonin 3 MG TABS tablet Take 3 mg by mouth at bedtime as needed (sleep).   Yes [provider]  polyvinyl alcohol (LIQUIFILM TEARS) 1.4 % ophthalmic solution Place 1 drop into both eyes as needed for dry eyes.   Yes [provider]  PRADAXA 150 MG CAPS capsule TAKE 1 CAPSULE BY MOUTH 2 TIMES DAILY Patient taking differently: Take 150 mg by mouth 2 (two) times daily.  08/17/19  Yes Richardo Priest, MD  pramipexole (MIRAPEX) 0.25 MG tablet Take 0.25 mg by mouth 2 (two) times daily.   Yes [provider]  Probiotic Product (PROBIOTIC PO) Take 1 capsule by mouth daily.   Yes [provider]  REPATHA SURECLICK 378 MG/ML SOAJ Inject 140 mg into the skin 2 (two) times a week.  05/17/19  Yes [provider]  allopurinol (ZYLOPRIM) 100 MG tablet Take 100 mg by mouth 2 (two) times daily as needed (gout).  11/08/14   [provider]  glucose blood test strip OneTouch Ultra [employer]    [provider]  Lancets (ONETOUCH DELICA PLUS HYIFOY77A) [place] OneTouch Delica Plus Lancet 33 gauge    [provider]  metoprolol succinate (TOPROL-XL) 50 MG 24 hr tablet TAKE 1 TABLET BY MOUTH EVERY DAY Patient not taking: Reported on 12/14/2019 04/13/19   Richardo Priest, MD  nitroGLYCERIN (NITROSTAT) 0.4 MG SL tablet Place 1 tablet (0.4 mg total) under the tongue every 5 (five) minutes as needed for chest pain. 04/14/17   Richardo Priest, MD    Allergies:  Allergies  Allergen Reactions  . Atorvastatin Other (See Comments)    Dizziness.  . Sulfa Antibiotics   . Ace Inhibitors Rash  . Amoxicillin Rash  . Meperidine Rash  . Naproxen Sodium Rash  . Sulfamethoxazole Rash  . Tetracycline Rash    (Not in a hospital admission)   Blood pressure  (!) 163/87, pulse 94, temperature 98.4 F (36.9 C), temperature source Oral, resp. rate 20, height 5\' 10"  (1.778 m), weight 125.6 kg, SpO2 96 %. Physical Exam:  General: pleasant, WD, obese, white male who is laying in bed in NAD HEENT: head is normocephalic, atraumatic.  Sclera are noninjected.  Pupils are equal.  Ears and nose without any masses or lesions.  Mouth is pink and moist Heart: regular, rate, and rhythm.  Normal s1,s2. No obvious murmurs, gallops, or rubs noted.  Palpable radial and pedal pulses bilaterally Lungs: CTAB, no wheezes, rhonchi, or rales noted.  Respiratory effort nonlabored Abd: distended, tender over epigastric area, no BS, no masses, hernias, or organomegaly. He has on open cholecystectomy scar and a long midline incision, below the xyphoid to below the umbilicus.  I could not feel any hernias MS: all 4 extremities are symmetrical with no cyanosis, clubbing, or  Trace edema, feet are pink and warm but  he would jump when examining his feet, lower legs, and I did not fell pulses in either foot. Skin: warm and dry with no masses, lesions, or rashes Neuro: Cranial nerves 2-12 grossly intact, sensation is normal throughout.  He is very confused and cannot answer any questions at this time. Psych: A&Ox3 with a confused affect.  .   Results for orders placed or performed during the hospital encounter of 12/14/19 (from the past 48 hour(s))  Lipase, blood     Status: None   Collection Time: 12/14/19  8:00 AM  Result Value Ref Range   Lipase 27 11 - 51 U/L    Comment: Performed at [facility], [place] [address], [place], [place] [address]  Comprehensive metabolic panel     Status: Abnormal   Collection Time: 12/14/19  8:00 AM  Result Value Ref Range   Sodium 137 135 - 145 mmol/L   Potassium 4.2 3.5 - 5.1 mmol/L   Chloride 93 (L) 98 - 111 mmol/L   CO2 27 22 - 32 mmol/L   Glucose, Bld 252 (H) 70 - 99 mg/dL    Comment: Glucose reference range applies only to  samples taken after fasting for at least 8 hours.   BUN 29 (H) 8 - 23 mg/dL   Creatinine, Ser 1.56 (H) 0.61 - 1.24 mg/dL   Calcium 9.8 8.9 - 10.3 mg/dL   Total Protein 8.0 6.5 - 8.1 g/dL   Albumin 4.5 3.5 - 5.0 g/dL   AST 24 15 - 41 U/L   ALT 21 0 - 44 U/L   Alkaline Phosphatase 57 38 - 126 U/L   Total Bilirubin 1.6 (H) 0.3 - 1.2 mg/dL   GFR calc non Af Amer 41 (L) >60 mL/min   GFR calc Af Amer 48 (L) >60 mL/min   Anion gap 17 (H) 5 - 15    Comment: Performed at [facility] [place], [place] [address], [place], [place] [address]  CBC     Status: Abnormal   Collection Time: 12/14/19  8:00 AM  Result Value Ref Range   WBC 13.1 (H) 4.0 - 10.5 K/uL   RBC 6.57 (H) 4.22 - 5.81 MIL/uL   Hemoglobin 18.4 (H) 13.0 - 17.0 g/dL   HCT 57.0 (H) 39 - 52 %   MCV 86.8 80.0 - 100.0 fL   MCH 28.0 26.0 - 34.0 pg   MCHC 32.3 30.0 - 36.0 g/dL   RDW 14.9 11.5 - 15.5 %   Platelets 254 150 - 400 K/uL   nRBC 0.0 0.0 - 0.2 %    Comment: Performed at [facility], [place] [address], [place], [place] [address]  Urinalysis, Routine w reflex microscopic Urine, Clean Catch     Status: Abnormal   Collection Time: 12/14/19 10:53 AM  Result Value Ref Range   Color, Urine YELLOW YELLOW   APPearance CLEAR CLEAR   Specific Gravity, Urine 1.027 1.005 - 1.030   pH 5.0 5.0 - 8.0   Glucose, UA >=500 (A) NEGATIVE mg/dL   Hgb urine dipstick MODERATE (A) NEGATIVE   Bilirubin Urine NEGATIVE NEGATIVE   Ketones, ur 5 (A) NEGATIVE mg/dL   Protein, ur NEGATIVE NEGATIVE mg/dL   Nitrite NEGATIVE NEGATIVE   Leukocytes,Ua MODERATE (A) NEGATIVE   RBC / HPF 11-20 0 - 5 RBC/hpf   WBC, UA 21-50 0 - 5 WBC/hpf   Bacteria, UA RARE (A) NONE SEEN   Squamous Epithelial / LPF 0-5 0 - 5  Mucus PRESENT     Comment: Performed at [facility], [place] [address] [place],  [address]   CT ABDOMEN PELVIS W CONTRAST  Result Date: 12/14/2019 CLINICAL DATA:  Acute nonlocalized abdominal pain  EXAM: CT ABDOMEN AND PELVIS WITH CONTRAST TECHNIQUE: Multidetector CT imaging of the abdomen and pelvis was performed using the standard protocol following bolus administration of intravenous contrast. CONTRAST:  110mL OMNIPAQUE IOHEXOL 300 MG/ML  SOLN COMPARISON:  April 10, 2016 FINDINGS: Lower chest: Signs of calcified coronary artery disease and aortic valvular calcification similar to the prior study. No effusion. No consolidation. Hepatobiliary: Hepatic steatosis. Post cholecystectomy. No focal, suspicious hepatic lesion. Portal vein is patent. Pancreas: Pancreas is normal without ductal dilation or inflammation. Spleen: Spleen normal in size and contour. Adrenals/Urinary Tract: Adrenal glands are normal. Symmetric renal enhancement.  No hydronephrosis. Stomach/Bowel: Signs of previous small-bowel resection with evidence of proximal small bowel distension, degree of distension similar to 05/11/2016. Mild perienteric stranding an subtle mesenteric fluid less pronounced than in 2016/05/11 in terms of the stranding that is present. There is a transition point at a similar location in the RIGHT mid abdomen (image 55, series 3) decompressed small bowel loops beyond this level. Abundant stool does however remain mainly within the distal colon. Signs of colonic diverticulosis without diverticular inflammation. Vascular/Lymphatic: Aortic atherosclerosis both calcified and noncalcified plaque. No aneurysm. No adenopathy. Vessels grossly patent on venous phase assessment. Portal vein is patent. Reproductive: Nonspecific appearance of prostate is unremarkable. Other: Moderate-size fat containing RIGHT inguinal hernia and small fat containing LEFT inguinal hernia. Musculoskeletal: Spinal degenerative changes. No acute or destructive bone process. IMPRESSION: 1. Early complete versus partial small bowel obstruction with similar appearance to study of April 10, 2016 showing signs of enteritis and evidence of  prior small-bowel resection. Transition point at a similar location in the RIGHT hemiabdomen. 2. Hepatic steatosis. 3. Diverticulosis 4. Moderate-size fat containing RIGHT inguinal hernia and small fat containing LEFT inguinal hernia. 5. Signs of calcified coronary artery disease and aortic valvular calcification similar to the prior study. 6. Aortic atherosclerosis. Aortic Atherosclerosis (ICD10-I70.0). Electronically Signed   By: Zetta Bills M.D.   On: 12/14/2019 12:14      Assessment/Plan Small bowel Hx lymphoma was noted 84 year old male Hx MI (myocardial infarction)/CAD Hx atrial fibrillation on Pradaxa - currently uncertain of last dose timing Hx type 2 diabetes Stage III chronic kidney disease COPD/sleep apnea Hx hypertension with heart failure - Dr. Shirlee More Hx orthostatic hypotension Morbid obesity Dyslipidemia Possible UTI Dehydration Bilateral fat containing inguinal hernias/CT   Recurrent small bowel obstruction versus partial obstruction Enteritis vs recurrent lymphoma S/P exploratory laparotomy small bowel resection secondary to his lymphoma, open lysis of adhesions S/p open cholecystectomy  FEN:NPO/IV fluids ID: none DVT:  None currently, he can have DVT chemical prophylaxis from our standpoint Follow up:  TBD  Plan:  Admit to Medicine to manage multiple Medical issues.   NG tube for decompression, IV fluids for hydration, SBP to start later tonight.  Earnstine Regal [facility] Surgery 12/14/2019, 1:14 PM Please see Amion for pager number during day hours 7:00am-4:30pm

## 2019-12-14 NOTE — H&P (Signed)
History and Physical        Hospital Admission Note Date: 12/14/2019  Patient name: Shawn Blanchard Medical record number: [id number] Date of birth: Aug 22, 1935 Age: 84 y.o. Gender: male  PCP: Algis Greenhouse, MD  Patient coming from: home   Chief Complaint    Chief Complaint  Patient presents with  . Abdominal Pain      HPI:   This is an 84 year old male with past medical history of chronic atrial fibrillation on Pradaxa, CKD 3 a, orthostatic hypotension, lymphoma s/p ex lap and small bowel resection, cholecystectomy, open lysis of adhesions, prior SBO, CAD, hyperlipidemia, hypertensive heart disease with CHF, diabetes who has complaints of abdominal distention and pain since yesterday without nausea and vomiting. Just received morphine and phenergan prior to my arrival so he felt a bit confused.  ED Course: Hemodynamically stable on room air.  Notable labs: Glucose 252, creatinine 1.56 (previously 1.31 in February), AG 17, T bili 1.6, WBC 13.1, Hb 18.4. UA with: 5 ketones, rare bacteria 500 glucose, RBC 11-20, WBC 21-50.  CT abdomen pelvis with contrast: Early complete versus partial small bowel obstruction with similar appearance to study of April 10, 2016 showing signs of enteritis and evidence of prior small-bowel resection. Transition point at a similar location in the RIGHT hemiabdomen; Moderate-size fat containing RIGHT inguinal hernia and small fat containing LEFT inguinal hernia.  General surgery was consulted.   Vitals:   12/14/19 1119 12/14/19 1334  BP: (!) 163/87 (!) 168/96  Pulse: 94 73  Resp: 20 (!) 28  Temp: 98.4 F (36.9 C)   SpO2: 96% 96%     Review of Systems:  Review of Systems  Constitutional: Negative for fever and malaise/fatigue.  Respiratory: Negative for cough and shortness of breath.   Cardiovascular: Negative for chest pain and palpitations.    Gastrointestinal: Positive for abdominal pain. Negative for constipation, diarrhea, nausea and vomiting.  Genitourinary: Negative.   Musculoskeletal: Negative.  Negative for myalgias.  All other systems reviewed and are negative.   Medical/Social/Family History   Past Medical History: Past Medical History:  Diagnosis Date  . Old MI (myocardial infarction)     Past Surgical History:  Procedure Laterality Date  . CATARACT EXTRACTION Left   . CERVICAL SPINE SURGERY    . CHOLECYSTECTOMY    . NASAL SINUS SURGERY    . PARTIAL KNEE ARTHROPLASTY Bilateral   . SMALL INTESTINE SURGERY    . TONSILLECTOMY      Medications: Prior to Admission medications   Medication Sig Start Date End Date Taking? Authorizing Provider  acetaminophen (TYLENOL) 325 MG tablet Take 325 mg by mouth every 6 (six) hours as needed for mild pain or headache.   Yes [provider]  aspirin EC 81 MG tablet Take 81 mg by mouth daily.   Yes [provider]  carvedilol (COREG) 3.125 MG tablet Take 1 tablet (3.125 mg total) by mouth 2 (two) times daily with a meal. 12/02/19  Yes Tobb, Kardie, DO  furosemide (LASIX) 20 MG tablet Take 1 tablet (20 mg total) by mouth 2 (two) times daily. 06/01/19  Yes Richardo Priest, MD  JARDIANCE 25 MG TABS tablet Take 25 mg by mouth daily.  05/20/17  Yes [provider]  KOMBIGLYZE XR 08-998 MG TB24 Take 1 tablet by mouth every morning. 11/11/19  Yes [provider]  melatonin 3 MG TABS tablet Take 3 mg by mouth at bedtime as needed (sleep).   Yes [provider]  polyvinyl alcohol (LIQUIFILM TEARS) 1.4 % ophthalmic solution Place 1 drop into both eyes as needed for dry eyes.   Yes [provider]  PRADAXA 150 MG CAPS capsule TAKE 1 CAPSULE BY MOUTH 2 TIMES DAILY Patient taking differently: Take 150 mg by mouth 2 (two) times daily.  08/17/19  Yes Richardo Priest, MD  pramipexole (MIRAPEX) 0.25 MG tablet Take 0.25 mg by mouth 2 (two) times  daily.   Yes [provider]  Probiotic Product (PROBIOTIC PO) Take 1 capsule by mouth daily.   Yes [provider]  REPATHA SURECLICK 967 MG/ML SOAJ Inject 140 mg into the skin 2 (two) times a week.  05/17/19  Yes [provider]  allopurinol (ZYLOPRIM) 100 MG tablet Take 100 mg by mouth 2 (two) times daily as needed (gout).  11/08/14   [provider]  glucose blood test strip OneTouch Ultra [employer]    [provider]  Lancets (ONETOUCH DELICA PLUS ELFYBO17P) [place] OneTouch Delica Plus Lancet 33 gauge    [provider]  metoprolol succinate (TOPROL-XL) 50 MG 24 hr tablet TAKE 1 TABLET BY MOUTH EVERY DAY Patient not taking: Reported on 12/14/2019 04/13/19   Richardo Priest, MD  nitroGLYCERIN (NITROSTAT) 0.4 MG SL tablet Place 1 tablet (0.4 mg total) under the tongue every 5 (five) minutes as needed for chest pain. 04/14/17   Richardo Priest, MD    Allergies:   Allergies  Allergen Reactions  . Atorvastatin Other (See Comments)    Dizziness.  . Sulfa Antibiotics   . Ace Inhibitors Rash  . Amoxicillin Rash  . Meperidine Rash  . Naproxen Sodium Rash  . Sulfamethoxazole Rash  . Tetracycline Rash    Social History:  reports that he has never smoked. He has never used smokeless tobacco. He reports previous alcohol use. He reports that he does not use drugs.  Family History: Family History  Problem Relation Age of Onset  . Cancer Mother   . Diabetes Mother   . Heart disease Mother   . Heart disease Father   . Stroke Father   . Diabetes Father      Objective   Physical Exam: Blood pressure (!) 168/96, pulse 73, temperature 98.4 F (36.9 C), temperature source Oral, resp. rate (!) 28, height 5\' 10"  (1.778 m), weight 125.6 kg, SpO2 96 %.  Physical Exam Vitals and nursing note reviewed.  Constitutional:      Appearance: Normal appearance.  HENT:     Head: Normocephalic and atraumatic.  Eyes:     Conjunctiva/sclera:  Conjunctivae normal.  Cardiovascular:     Rate and Rhythm: Normal rate and regular rhythm.  Pulmonary:     Effort: Pulmonary effort is normal.     Breath sounds: Normal breath sounds.  Abdominal:     General: Bowel sounds are decreased. There is distension.     Tenderness: There is abdominal tenderness in the left upper quadrant.  Musculoskeletal:        General: No swelling or tenderness.  Skin:    Coloration: Skin is not jaundiced or pale.  Neurological:     Mental Status: He is alert. Mental status is at baseline.  Comments: Awake and alert, oriented x 2  Psychiatric:        Mood and Affect: Mood normal.        Behavior: Behavior normal.     LABS on Admission: I have personally reviewed all the labs and imaging below    Basic Metabolic Panel: Recent Labs  Lab 12/14/19 0800  NA 137  K 4.2  CL 93*  CO2 27  GLUCOSE 252*  BUN 29*  CREATININE 1.56*  CALCIUM 9.8   Liver Function Tests: Recent Labs  Lab 12/14/19 0800  AST 24  ALT 21  ALKPHOS 57  BILITOT 1.6*  PROT 8.0  ALBUMIN 4.5   Recent Labs  Lab 12/14/19 0800  LIPASE 27   No results for input(s): AMMONIA in the last 168 hours. CBC: Recent Labs  Lab 12/14/19 0800  WBC 13.1*  HGB 18.4*  HCT 57.0*  MCV 86.8  PLT 254   Cardiac Enzymes: No results for input(s): CKTOTAL, CKMB, CKMBINDEX, TROPONINI in the last 168 hours. BNP: Invalid input(s): POCBNP CBG: No results for input(s): GLUCAP in the last 168 hours.  Radiological Exams on Admission:  CT ABDOMEN PELVIS W CONTRAST  Result Date: 12/14/2019 CLINICAL DATA:  Acute nonlocalized abdominal pain EXAM: CT ABDOMEN AND PELVIS WITH CONTRAST TECHNIQUE: Multidetector CT imaging of the abdomen and pelvis was performed using the standard protocol following bolus administration of intravenous contrast. CONTRAST:  178mL OMNIPAQUE IOHEXOL 300 MG/ML  SOLN COMPARISON:  April 10, 2016 FINDINGS: Lower chest: Signs of calcified coronary artery disease and aortic  valvular calcification similar to the prior study. No effusion. No consolidation. Hepatobiliary: Hepatic steatosis. Post cholecystectomy. No focal, suspicious hepatic lesion. Portal vein is patent. Pancreas: Pancreas is normal without ductal dilation or inflammation. Spleen: Spleen normal in size and contour. Adrenals/Urinary Tract: Adrenal glands are normal. Symmetric renal enhancement.  No hydronephrosis. Stomach/Bowel: Signs of previous small-bowel resection with evidence of proximal small bowel distension, degree of distension similar to May 10, 2016. Mild perienteric stranding an subtle mesenteric fluid less pronounced than in May 10, 2016 in terms of the stranding that is present. There is a transition point at a similar location in the RIGHT mid abdomen (image 55, series 3) decompressed small bowel loops beyond this level. Abundant stool does however remain mainly within the distal colon. Signs of colonic diverticulosis without diverticular inflammation. Vascular/Lymphatic: Aortic atherosclerosis both calcified and noncalcified plaque. No aneurysm. No adenopathy. Vessels grossly patent on venous phase assessment. Portal vein is patent. Reproductive: Nonspecific appearance of prostate is unremarkable. Other: Moderate-size fat containing RIGHT inguinal hernia and small fat containing LEFT inguinal hernia. Musculoskeletal: Spinal degenerative changes. No acute or destructive bone process. IMPRESSION: 1. Early complete versus partial small bowel obstruction with similar appearance to study of April 10, 2016 showing signs of enteritis and evidence of prior small-bowel resection. Transition point at a similar location in the RIGHT hemiabdomen. 2. Hepatic steatosis. 3. Diverticulosis 4. Moderate-size fat containing RIGHT inguinal hernia and small fat containing LEFT inguinal hernia. 5. Signs of calcified coronary artery disease and aortic valvular calcification similar to the prior study. 6. Aortic  atherosclerosis. Aortic Atherosclerosis (ICD10-I70.0). Electronically Signed   By: Zetta Bills M.D.   On: 12/14/2019 12:14       EKG: Independently reviewed.    A & P   Principal Problem:   SBO (small bowel obstruction) (HCC) Active Problems:   Chronic atrial fibrillation (HCC)   Chronic diastolic heart failure (HCC)   Type 2 diabetes mellitus with  neurologic complication, without long-term current use of insulin (HCC)   Chronic anticoagulation   Essential hypertension   1. SIRS secondary to recurrent SBO vs. Partial obstruction with history of multiple abdominal surgeries and lymphoma a. SIRS: tachypnea, leukocytosis  b. General surgery on board c. Be mindful of opiates/sedatives as patient felt a bit confused from the meds d. IV fluids e. NPO  2. Chronic atrial fibrillation, rate controlled a. Follows with cardio and on pradaxa as an outpatient b. CHA2DS2-Vasc: at least 6 c. Since he is NPO, will start on heparin drip until he is tolerating PO intake d. Telemetry  3. CKD 3a a. Creatinine slightly above baseline b. Follow up in AM after IV fluids  4. Diabetes with hyperglycemia a. Hold home meds b. Start on Lantus  5. Elevated anion gap a. AG 17, CO2 27 b. Will check a lactic acid and ketone level c. 5 ketones in urine   DVT prophylaxis: heparin   Code Status: Not on file  Diet: NPO Family Communication: Admission, patients condition and plan of care including tests being ordered have been discussed with the patient who indicates understanding and agrees with the plan and Code Status.   Disposition Plan: The appropriate patient status for this patient is INPATIENT. Inpatient status is judged to be reasonable and necessary in order to provide the required intensity of service to ensure the patient's safety. The patient's presenting symptoms, physical exam findings, and initial radiographic and laboratory data in the context of their chronic comorbidities is felt  to place them at high risk for further clinical deterioration. Furthermore, it is not anticipated that the patient will be medically stable for discharge from the hospital within 2 midnights of admission. The following factors support the patient status of inpatient.   " The patient's presenting symptoms include abdominal pain, distension. " The worrisome physical exam findings include abdominal tenderness and distension. " The initial radiographic and laboratory data are worrisome because of SBO. " The chronic co-morbidities include obesity, abdominal surgeries, prior SBO, lymphoma.   * I certify that at the point of admission it is my clinical judgment that the patient will require inpatient hospital care spanning beyond 2 midnights from the point of admission due to high intensity of service, high risk for further deterioration and high frequency of surveillance required.*     Consultants  . General surgery  Procedures  . none  Time Spent on Admission: 60 minutes    Harold Hedge, DO [facility] Pager [phone] 12/14/2019, 3:22 PM

## 2019-12-14 NOTE — ED Notes (Addendum)
Date and time results received: 12/14/19 3:46 PM   Test: Lactic Acid  Critical Value: 2.2  Name of Provider Notified: Neysa Bonito, MD   Orders Received? Or Actions Taken?: Orders Received - See Orders for details

## 2019-12-14 NOTE — ED Notes (Signed)
Pt reports a night terror and waking up to himself removing IV, NG tube, condom catheter and all cardiac monitoring.

## 2019-12-15 ENCOUNTER — Inpatient Hospital Stay (HOSPITAL_COMMUNITY): Payer: Medicare PPO

## 2019-12-15 DIAGNOSIS — I10 Essential (primary) hypertension: Secondary | ICD-10-CM

## 2019-12-15 DIAGNOSIS — I482 Chronic atrial fibrillation, unspecified: Secondary | ICD-10-CM

## 2019-12-15 DIAGNOSIS — K56609 Unspecified intestinal obstruction, unspecified as to partial versus complete obstruction: Secondary | ICD-10-CM

## 2019-12-15 DIAGNOSIS — E1140 Type 2 diabetes mellitus with diabetic neuropathy, unspecified: Secondary | ICD-10-CM

## 2019-12-15 DIAGNOSIS — Z7901 Long term (current) use of anticoagulants: Secondary | ICD-10-CM

## 2019-12-15 DIAGNOSIS — I5032 Chronic diastolic (congestive) heart failure: Secondary | ICD-10-CM

## 2019-12-15 LAB — BASIC METABOLIC PANEL
Anion gap: 17 — ABNORMAL HIGH (ref 5–15)
BUN: 32 mg/dL — ABNORMAL HIGH (ref 8–23)
CO2: 27 mmol/L (ref 22–32)
Calcium: 9.6 mg/dL (ref 8.9–10.3)
Chloride: 98 mmol/L (ref 98–111)
Creatinine, Ser: 1.74 mg/dL — ABNORMAL HIGH (ref 0.61–1.24)
GFR calc Af Amer: 42 mL/min — ABNORMAL LOW (ref >60)
GFR calc non Af Amer: 36 mL/min — ABNORMAL LOW (ref >60)
Glucose, Bld: 246 mg/dL — ABNORMAL HIGH (ref 70–99)
Potassium: 4.8 mmol/L (ref 3.5–5.1)
Sodium: 142 mmol/L (ref 135–145)

## 2019-12-15 LAB — GLUCOSE, CAPILLARY
Glucose-Capillary: 159 mg/dL — ABNORMAL HIGH (ref 70–99)
Glucose-Capillary: 127 mg/dL — ABNORMAL HIGH (ref 70–99)

## 2019-12-15 LAB — CBG MONITORING, ED
Glucose-Capillary: 224 mg/dL — ABNORMAL HIGH (ref 70–99)
Glucose-Capillary: 198 mg/dL — ABNORMAL HIGH (ref 70–99)

## 2019-12-15 LAB — CBC
HCT: 54.6 % — ABNORMAL HIGH (ref 39.0–52.0)
Hemoglobin: 17.8 g/dL — ABNORMAL HIGH (ref 13.0–17.0)
MCH: 28.4 pg (ref 26.0–34.0)
MCHC: 32.6 g/dL (ref 30.0–36.0)
MCV: 87.2 fL (ref 80.0–100.0)
Platelets: 253 10*3/uL (ref 150–400)
RBC: 6.26 MIL/uL — ABNORMAL HIGH (ref 4.22–5.81)
RDW: 14.9 % (ref 11.5–15.5)
WBC: 8.9 10*3/uL (ref 4.0–10.5)
nRBC: 0 % (ref 0.0–0.2)

## 2019-12-15 LAB — HEPARIN LEVEL (UNFRACTIONATED)
Heparin Unfractionated: 0.74 IU/mL — ABNORMAL HIGH (ref 0.30–0.70)
Heparin Unfractionated: >2.2 IU/mL — ABNORMAL HIGH (ref 0.30–0.70)
Heparin Unfractionated: 0.23 IU/mL — ABNORMAL LOW (ref 0.30–0.70)
Heparin Unfractionated: 0.22 IU/mL — ABNORMAL LOW (ref 0.30–0.70)

## 2019-12-15 MED ORDER — PHENOL 1.4 % MT LIQD
1.0000 | OROMUCOSAL | Status: DC | PRN
  Filled 2019-12-15: qty 177

## 2019-12-15 MED ORDER — HEPARIN (PORCINE) 25000 UT/250ML-% IV SOLN
1700.0000 [IU]/h | INTRAVENOUS | Status: DC
  Administered 2019-12-16: 1600 [IU]/h via INTRAVENOUS
  Administered 2019-12-16: 1700 [IU]/h via INTRAVENOUS
  Filled 2019-12-15 (×2): qty 250

## 2019-12-15 MED ORDER — HEPARIN (PORCINE) 25000 UT/250ML-% IV SOLN
1350.0000 [IU]/h | INTRAVENOUS | Status: DC
  Administered 2019-12-15: 1350 [IU]/h via INTRAVENOUS
  Filled 2019-12-15: qty 250

## 2019-12-15 MED ORDER — HEPARIN (PORCINE) 25000 UT/250ML-% IV SOLN: 1450.0000 [IU]/h | INTRAVENOUS | Status: DC

## 2019-12-15 MED ORDER — LACTATED RINGERS IV SOLN: INTRAVENOUS | Status: DC

## 2019-12-15 NOTE — ED Notes (Signed)
Pt removed NG tube again.

## 2019-12-15 NOTE — Progress Notes (Addendum)
[facility] Progress Note     Subjective: CC:  Denies abdominal pain. Denies flatus or BM. High output from NG - over 3.2 Liters in 24 hours.  Objective: Vital signs in last 24 hours: Temp:  [97.7 F (36.5 C)-98.4 F (36.9 C)] 98.4 F (36.9 C) (09/07 1119) Pulse Rate:  [60-112] 95 (09/08 0715) Resp:  [15-33] 20 (09/08 0700) BP: (137-186)/(61-102) 169/93 (09/08 0700) SpO2:  [90 %-96 %] 93 % (09/08 0715) Weight:  [125.6 kg] 125.6 kg (09/07 1119)    Intake/Output from previous day: 09/07 0701 - 09/08 0700 In: -  Out: 4200 [Urine:200; Emesis/NG output:4000] Intake/Output this shift: No intake/output data recorded.  PE: Gen:  Alert, NAD, pleasant Card:  Regular rate and rhythm, pedal pulses 2+ BL Pulm:  Normal effort, clear to auscultation bilaterally Abd: Soft, protuberant non-tender, non-distended, hypoactive bowel sounds, previous laparotomy scar and right subcostal incision.  Skin: warm and dry, no rashes  Psych: A&Ox3   Lab Results:  Recent Labs    12/14/19 0800 12/15/19 0403  WBC 13.1* 8.9  HGB 18.4* 17.8*  HCT 57.0* 54.6*  PLT 254 253   BMET Recent Labs    12/14/19 0800 12/15/19 0403  NA 137 142  K 4.2 4.8  CL 93* 98  CO2 27 27  GLUCOSE 252* 246*  BUN 29* 32*  CREATININE 1.56* 1.74*  CALCIUM 9.8 9.6   PT/INR Recent Labs    12/14/19 1102  LABPROT 16.1*  INR 1.3*   CMP     Component Value Date/Time   NA 142 12/15/2019 0403   NA 141 06/03/2019 1543   K 4.8 12/15/2019 0403   CL 98 12/15/2019 0403   CO2 27 12/15/2019 0403   GLUCOSE 246 (H) 12/15/2019 0403   BUN 32 (H) 12/15/2019 0403   BUN 25 06/03/2019 1543   CREATININE 1.74 (H) 12/15/2019 0403   CALCIUM 9.6 12/15/2019 0403   PROT 8.0 12/14/2019 0800   ALBUMIN 4.5 12/14/2019 0800   AST 24 12/14/2019 0800   ALT 21 12/14/2019 0800   ALKPHOS 57 12/14/2019 0800   BILITOT 1.6 (H) 12/14/2019 0800   GFRNONAA 36 (L) 12/15/2019 0403   GFRAA 42 (L) 12/15/2019 0403   Lipase      Component Value Date/Time   LIPASE 27 12/14/2019 0800       Studies/Results: DG Abdomen 1 View  Result Date: 12/15/2019 CLINICAL DATA:  Reinsertion of NG tube EXAM: ABDOMEN - 1 VIEW COMPARISON:  12/14/2019 FINDINGS: Enteric tube is present with tip coiled in the left upper quadrant consistent with location in the body of the stomach. Mild gaseous distention of the stomach is suggested. IMPRESSION: Enteric tube tip is coiled in the left upper quadrant consistent with location in the body of the stomach. Electronically Signed   By: Lucienne Capers M.D.   On: 12/15/2019 03:33   DG Abdomen 1 View  Result Date: 12/14/2019 CLINICAL DATA:  NG tube placement EXAM: ABDOMEN - 1 VIEW COMPARISON:  12/14/2019 FINDINGS: The NG tube tip projects over the gastric body. The tip is pointed distally. The visualized bowel gas pattern is unremarkable. IMPRESSION: NG tube projects over the gastric body. Electronically Signed   By: Constance Holster M.D.   On: 12/14/2019 23:07   DG Abdomen 1 View  Result Date: 12/14/2019 CLINICAL DATA:  Enteric tube placement. EXAM: ABDOMEN - 1 VIEW COMPARISON:  Abdominal x-ray from same day. FINDINGS: Enteric tube tip in the stomach with the distal side port in the  lower esophagus. The bowel gas pattern is normal. No radio-opaque calculi or other significant radiographic abnormality are seen. IMPRESSION: 1. Enteric tube in the stomach with the distal side port in the lower esophagus. Recommend advancing 8 cm. Electronically Signed   By: Titus Dubin M.D.   On: 12/14/2019 21:08   CT ABDOMEN PELVIS W CONTRAST  Result Date: 12/14/2019 CLINICAL DATA:  Acute nonlocalized abdominal pain EXAM: CT ABDOMEN AND PELVIS WITH CONTRAST TECHNIQUE: Multidetector CT imaging of the abdomen and pelvis was performed using the standard protocol following bolus administration of intravenous contrast. CONTRAST:  178mL OMNIPAQUE IOHEXOL 300 MG/ML  SOLN COMPARISON:  April 10, 2016 FINDINGS: Lower chest:  Signs of calcified coronary artery disease and aortic valvular calcification similar to the prior study. No effusion. No consolidation. Hepatobiliary: Hepatic steatosis. Post cholecystectomy. No focal, suspicious hepatic lesion. Portal vein is patent. Pancreas: Pancreas is normal without ductal dilation or inflammation. Spleen: Spleen normal in size and contour. Adrenals/Urinary Tract: Adrenal glands are normal. Symmetric renal enhancement.  No hydronephrosis. Stomach/Bowel: Signs of previous small-bowel resection with evidence of proximal small bowel distension, degree of distension similar to 05/17/16. Mild perienteric stranding an subtle mesenteric fluid less pronounced than in 17-May-2016 in terms of the stranding that is present. There is a transition point at a similar location in the RIGHT mid abdomen (image 55, series 3) decompressed small bowel loops beyond this level. Abundant stool does however remain mainly within the distal colon. Signs of colonic diverticulosis without diverticular inflammation. Vascular/Lymphatic: Aortic atherosclerosis both calcified and noncalcified plaque. No aneurysm. No adenopathy. Vessels grossly patent on venous phase assessment. Portal vein is patent. Reproductive: Nonspecific appearance of prostate is unremarkable. Other: Moderate-size fat containing RIGHT inguinal hernia and small fat containing LEFT inguinal hernia. Musculoskeletal: Spinal degenerative changes. No acute or destructive bone process. IMPRESSION: 1. Early complete versus partial small bowel obstruction with similar appearance to study of April 10, 2016 showing signs of enteritis and evidence of prior small-bowel resection. Transition point at a similar location in the RIGHT hemiabdomen. 2. Hepatic steatosis. 3. Diverticulosis 4. Moderate-size fat containing RIGHT inguinal hernia and small fat containing LEFT inguinal hernia. 5. Signs of calcified coronary artery disease and aortic valvular  calcification similar to the prior study. 6. Aortic atherosclerosis. Aortic Atherosclerosis (ICD10-I70.0). Electronically Signed   By: Zetta Bills M.D.   On: 12/14/2019 12:14   DG Abd Portable 1V-Small Bowel Obstruction Protocol-initial, 8 hr delay  Result Date: 12/15/2019 CLINICAL DATA:  Small-bowel obstruction EXAM: PORTABLE ABDOMEN - 1 VIEW COMPARISON:  12/15/2019, 12/14/2019 FINDINGS: Nasogastric tube courses below the diaphragm with distal tip and side port coiled within the gastric body/fundus. Mildly dilated loops of small bowel within the abdomen measuring up to 3.9 cm in diameter, similar to prior. No gross free intraperitoneal air. IMPRESSION: 1. Similar degree of small bowel dilation compared to prior. 2. Nasogastric tube courses below the diaphragm with distal tip and side port coiled within the gastric body/fundus. Electronically Signed   By: Davina Poke D.O.   On: 12/15/2019 08:02   DG Abd Portable 1V-Small Bowel Protocol-Position Verification  Result Date: 12/14/2019 CLINICAL DATA:  Nasogastric tube placement. EXAM: PORTABLE ABDOMEN - 1 VIEW COMPARISON:  June 17, 2019. FINDINGS: The bowel gas pattern is normal. Distal tip of nasogastric tube is seen in proximal stomach. No radio-opaque calculi or other significant radiographic abnormality are seen. IMPRESSION: Distal tip of nasogastric tube seen in proximal stomach. Electronically Signed   By: Jeneen Rinks  Murlean Caller M.D.   On: 12/14/2019 16:36    Anti-infectives: Anti-infectives (From admission, onward)   None     Assessment/Plan Small bowel Hx lymphoma was noted 84 year old male Hx MI (myocardial infarction)/CAD Hx atrial fibrillation on Pradaxa - currently uncertain of last dose timing Hx type 2 diabetes Stage III chronic kidney disease COPD/sleep apnea Hx hypertension with heart failure - Dr. 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 I think Gastrogaffin that was given yesterday was sucked back out of NG, not visible today on KUB. Continue NG tube for decompression, IV fluids for hydration. Will likely repeat small bowel protocol tomorrow if patient does not improve over the next 24 hours with decompression. If he fails to improved with non-operative measures he may need exploratory laparotomy.    LOS: 1 day    Obie Dredge, [facility] Surgery Please see Amion for pager number during day hours 7:00am-4:30pm

## 2019-12-15 NOTE — Progress Notes (Addendum)
PROGRESS NOTE    Shawn Blanchard  QVZ:[id number]  DOB: 18-Jan-1936  PCP: Algis Greenhouse, MD Admit date:12/14/2019 Chief compliant: Abdominal pain, distention 84 year old male with past medical history of chronic atrial fibrillation on Pradaxa, CKD 3 a, orthostatic hypotension, 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  Not associated with nausea and vomiting. ED Course: Afebrile. Notable labs: Glucose 252, creatinine 1.56 (previously 1.31 in February), AG 17, T bili 1.6, WBC 13.1, Hb 18.4. UA with: 5 ketones, rare bacteria 500 glucose, RBC 11-20, WBC 21-50.  CT abdomen pelvis with contrast: Early complete versus partial small bowel obstruction with similar appearance to study of April 10, 2016 showing signs of enteritis and evidence of prior small-bowel resection. Transition point at a similar location in the RIGHT hemiabdomen; Moderate-size fat containing RIGHT inguinal hernia and small fat containing LEFT inguinal hernia.   Hospital course: Patient admitted to [facility] with general surgery consultation.  NG tube placed for decompression and medical management.  Subjective: Patient still in ED awaiting bed availability when seen in rounds this morning.  Reported feeling better since NG placed although had to undergo 3 unsuccessful attempts.  NG drainage does not appear to be bloody.  Had a small bowel movement earlier in the day.  Objective: Vitals:   12/15/19 0946 12/15/19 1100 12/15/19 1325 12/15/19 1843  BP: 138/83 (!) 150/82 (!) 146/64 (!) 154/54  Pulse: 97 68 63 (!) 50  Resp: 18 19 16 19   Temp:   98.6 F (37 C) 98.5 F (36.9 C)  TempSrc:   Oral Oral  SpO2: 90% 92% 98% 94%  Weight:   122 kg   Height:   5\' 11"  (1.803 m)     Intake/Output Summary (Last 24 hours) at 12/15/2019 1908 Last data filed at  12/15/2019 1739 Gross per 24 hour  Intake 216.39 ml  Output 3501 ml  Net -3284.61 ml   Filed Weights   12/14/19 1119 12/15/19 1325  Weight: 125.6 kg 122 kg    Physical Examination:  General: Heavy built, no acute distress noted,  Head ENT:NG tube in place, PERRLA, neck supple Heart: S1-S2 heard, regular rate and rhythm, no murmurs.  No leg edema noted Lungs: Equal air entry bilaterally, no rhonchi or rales on exam, no accessory muscle use Abdomen: Distended, tympanic, bowel sounds not heard,  extremities: No pedal edema.  No cyanosis or clubbing. Neurological: Awake alert oriented x3, no focal weakness or numbness, strength and sensations to crude touch intact Skin: No wounds or rashes.   Data Reviewed: I have personally reviewed following labs and imaging studies  CBC: Recent Labs  Lab 12/14/19 0800 12/15/19 0403  WBC 13.1* 8.9  HGB 18.4* 17.8*  HCT 57.0* 54.6*  MCV 86.8 87.2  PLT 254 329   Basic Metabolic Panel: Recent Labs  Lab 12/14/19 0800 12/15/19 0403  NA 137 142  K 4.2 4.8  CL 93* 98  CO2 27 27  GLUCOSE 252* 246*  BUN 29* 32*  CREATININE 1.56* 1.74*  CALCIUM 9.8 9.6   GFR: Estimated Creatinine Clearance: 44.3 mL/min (A) (by C-G formula based on SCr of 1.74 mg/dL (H)). Liver Function Tests: Recent Labs  Lab 12/14/19 0800  AST 24  ALT 21  ALKPHOS 57  BILITOT 1.6*  PROT 8.0  ALBUMIN 4.5   Recent Labs  Lab 12/14/19 0800  LIPASE  27   No results for input(s): AMMONIA in the last 168 hours. Coagulation Profile: Recent Labs  Lab 12/14/19 1102  INR 1.3*   Cardiac Enzymes: No results for input(s): CKTOTAL, CKMB, CKMBINDEX, TROPONINI in the last 168 hours. BNP (last 3 results) No results for input(s): PROBNP in the last 8760 hours. HbA1C: Recent Labs    12/14/19 0800  HGBA1C 7.1*   CBG: Recent Labs  Lab 12/14/19 2033 12/14/19 2325 12/15/19 0757 12/15/19 1242 12/15/19 1700  GLUCAP 195* 235* 224* 198* 159*   Lipid Profile: No  results for input(s): CHOL, HDL, LDLCALC, TRIG, CHOLHDL, LDLDIRECT in the last 72 hours. Thyroid Function Tests: No results for input(s): TSH, T4TOTAL, FREET4, T3FREE, THYROIDAB in the last 72 hours. Anemia Panel: No results for input(s): VITAMINB12, FOLATE, FERRITIN, TIBC, IRON, RETICCTPCT in the last 72 hours. Sepsis Labs: Recent Labs  Lab 12/14/19 1410  LATICACIDVEN 2.2*    Recent Results (from the past 240 hour(s))  SARS Coronavirus 2 by RT PCR (hospital order, performed in [facility] hospital lab) Nasopharyngeal Nasopharyngeal Swab     Status: None   Collection Time: 12/14/19  1:07 PM   Specimen: Nasopharyngeal Swab  Result Value Ref Range Status   SARS Coronavirus 2 NEGATIVE NEGATIVE Final    Comment: (NOTE) SARS-CoV-2 target nucleic acids are NOT DETECTED.  The SARS-CoV-2 RNA is generally detectable in upper and lower respiratory specimens during the acute phase of infection. The lowest concentration of SARS-CoV-2 viral copies this assay can detect is 250 copies / mL. A negative result does not preclude SARS-CoV-2 infection and should not be used as the sole basis for treatment or other patient management decisions.  A negative result may occur with improper specimen collection / handling, submission of specimen other than nasopharyngeal swab, presence of viral mutation(s) within the areas targeted by this assay, and inadequate number of viral copies (<250 copies / mL). A negative result must be combined with clinical observations, patient history, and epidemiological information.  Fact Sheet for Patients:   StrictlyIdeas.no  Fact Sheet for Healthcare Providers: BankingDealers.co.za  This test is not yet approved or  cleared by the [place] FDA and has been authorized for detection and/or diagnosis of SARS-CoV-2 by FDA under an Emergency Use Authorization (EUA).  This EUA will remain in effect (meaning this test can  be used) for the duration of the COVID-19 declaration under Section 564(b)(1) of the Act, 21 U.S.C. section 360bbb-3(b)(1), unless the authorization is terminated or revoked sooner.  Performed at [facility], [place] [address], [place], [place] [address]       Radiology Studies: DG Abdomen 1 View  Result Date: 12/15/2019 CLINICAL DATA:  Reinsertion of NG tube EXAM: ABDOMEN - 1 VIEW COMPARISON:  12/14/2019 FINDINGS: Enteric tube is present with tip coiled in the left upper quadrant consistent with location in the body of the stomach. Mild gaseous distention of the stomach is suggested. IMPRESSION: Enteric tube tip is coiled in the left upper quadrant consistent with location in the body of the stomach. Electronically Signed   By: Lucienne Capers M.D.   On: 12/15/2019 03:33   DG Abdomen 1 View  Result Date: 12/14/2019 CLINICAL DATA:  NG tube placement EXAM: ABDOMEN - 1 VIEW COMPARISON:  12/14/2019 FINDINGS: The NG tube tip projects over the gastric body. The tip is pointed distally. The visualized bowel gas pattern is unremarkable. IMPRESSION: NG tube projects over the gastric body. Electronically Signed   By: Jamie Kato.D.  On: 12/14/2019 23:07   DG Abdomen 1 View  Result Date: 12/14/2019 CLINICAL DATA:  Enteric tube placement. EXAM: ABDOMEN - 1 VIEW COMPARISON:  Abdominal x-ray from same day. FINDINGS: Enteric tube tip in the stomach with the distal side port in the lower esophagus. The bowel gas pattern is normal. No radio-opaque calculi or other significant radiographic abnormality are seen. IMPRESSION: 1. Enteric tube in the stomach with the distal side port in the lower esophagus. Recommend advancing 8 cm. Electronically Signed   By: Titus Dubin M.D.   On: 12/14/2019 21:08   CT ABDOMEN PELVIS W CONTRAST  Result Date: 12/14/2019 CLINICAL DATA:  Acute nonlocalized abdominal pain EXAM: CT ABDOMEN AND PELVIS WITH CONTRAST TECHNIQUE: Multidetector CT imaging of  the abdomen and pelvis was performed using the standard protocol following bolus administration of intravenous contrast. CONTRAST:  118mL OMNIPAQUE IOHEXOL 300 MG/ML  SOLN COMPARISON:  April 10, 2016 FINDINGS: Lower chest: Signs of calcified coronary artery disease and aortic valvular calcification similar to the prior study. No effusion. No consolidation. Hepatobiliary: Hepatic steatosis. Post cholecystectomy. No focal, suspicious hepatic lesion. Portal vein is patent. Pancreas: Pancreas is normal without ductal dilation or inflammation. Spleen: Spleen normal in size and contour. Adrenals/Urinary Tract: Adrenal glands are normal. Symmetric renal enhancement.  No hydronephrosis. Stomach/Bowel: Signs of previous small-bowel resection with evidence of proximal small bowel distension, degree of distension similar to 05/16/16. Mild perienteric stranding an subtle mesenteric fluid less pronounced than in 2016/05/16 in terms of the stranding that is present. There is a transition point at a similar location in the RIGHT mid abdomen (image 55, series 3) decompressed small bowel loops beyond this level. Abundant stool does however remain mainly within the distal colon. Signs of colonic diverticulosis without diverticular inflammation. Vascular/Lymphatic: Aortic atherosclerosis both calcified and noncalcified plaque. No aneurysm. No adenopathy. Vessels grossly patent on venous phase assessment. Portal vein is patent. Reproductive: Nonspecific appearance of prostate is unremarkable. Other: Moderate-size fat containing RIGHT inguinal hernia and small fat containing LEFT inguinal hernia. Musculoskeletal: Spinal degenerative changes. No acute or destructive bone process. IMPRESSION: 1. Early complete versus partial small bowel obstruction with similar appearance to study of April 10, 2016 showing signs of enteritis and evidence of prior small-bowel resection. Transition point at a similar location in the RIGHT  hemiabdomen. 2. Hepatic steatosis. 3. Diverticulosis 4. Moderate-size fat containing RIGHT inguinal hernia and small fat containing LEFT inguinal hernia. 5. Signs of calcified coronary artery disease and aortic valvular calcification similar to the prior study. 6. Aortic atherosclerosis. Aortic Atherosclerosis (ICD10-I70.0). Electronically Signed   By: Zetta Bills M.D.   On: 12/14/2019 12:14   DG Abd Portable 1V-Small Bowel Obstruction Protocol-initial, 8 hr delay  Result Date: 12/15/2019 CLINICAL DATA:  Small-bowel obstruction EXAM: PORTABLE ABDOMEN - 1 VIEW COMPARISON:  12/15/2019, 12/14/2019 FINDINGS: Nasogastric tube courses below the diaphragm with distal tip and side port coiled within the gastric body/fundus. Mildly dilated loops of small bowel within the abdomen measuring up to 3.9 cm in diameter, similar to prior. No gross free intraperitoneal air. IMPRESSION: 1. Similar degree of small bowel dilation compared to prior. 2. Nasogastric tube courses below the diaphragm with distal tip and side port coiled within the gastric body/fundus. Electronically Signed   By: Davina Poke D.O.   On: 12/15/2019 08:02   DG Abd Portable 1V-Small Bowel Protocol-Position Verification  Result Date: 12/14/2019 CLINICAL DATA:  Nasogastric tube placement. EXAM: PORTABLE ABDOMEN - 1 VIEW COMPARISON:  June 17, 2019. FINDINGS: The bowel gas pattern is normal. Distal tip of nasogastric tube is seen in proximal stomach. No radio-opaque calculi or other significant radiographic abnormality are seen. IMPRESSION: Distal tip of nasogastric tube seen in proximal stomach. Electronically Signed   By: Marijo Conception M.D.   On: 12/14/2019 16:36      Scheduled Meds: . furosemide  20 mg Intravenous Daily  . insulin aspart  0-15 Units Subcutaneous TID WC  . insulin glargine  10 Units Subcutaneous Daily  . metoprolol tartrate  5 mg Intravenous Q12H  . sodium chloride flush  3 mL Intravenous Q12H   Continuous  Infusions: . heparin 1,450 Units/hr (12/15/19 1418)      Assessment/Plan:  1.  Recurrent small bowel obstruction with SIRS present on admission: Afebrile but presented with tachypnea, leukocytosis.  Seen by general surgery and recommended trying conservative medical management, may need surgical intervention if shows no significant improvement.  Remains n.p.o. with IV fluids and NG tube to suction.  Monitor electrolytes and replace as needed.  2.  Chronic atrial fibrillation: Rate controlled.  On Pradaxa at baseline, now on IV heparin while NPO.  3.  AKI on CKD stage IIIa: Baseline creatinine appears to be around 1.3 in February 2021 with GFR around 51.  Currently creatinine elevated up to 1.74 and BUN at 32.  Likely secondary to GI losses and n.p.o. status.  Add IV fluids.  Hold Lasix .  4.?  CAD, chronic diastolic CHF: Patient noted to be on aspirin, Coreg and Lasix 20 mg twice daily at baseline.  No echo in record.  Hold Lasix for now given problem #3.  Obtain echo.  5.  Diabetes mellitus: Holding home oral hypoglycemics.  Blood glucose elevated at 235 yesterday and started on Lantus.  Monitor for hypoglycemia while n.p.o., currently blood glucose around 150    DVT prophylaxis: Heparin Code Status: Full code Family / Patient Communication: Discussed with patient Disposition Plan:   Status is: Inpatient  Remains inpatient appropriate because:Ongoing diagnostic testing needed not appropriate for outpatient work up, IV treatments appropriate due to intensity of illness or inability to take PO and Inpatient level of care appropriate due to severity of illness   Dispo: The patient is from: Home              Anticipated d/c is to: Home              Anticipated d/c date is: 2 days              Patient currently is not medically stable to d/c.           Time spent: 35 mins     >50% time spent in discussions with care team and coordination of care.    Guilford Shi,  MD [facility] Pager in [place]  If 7PM-7AM, please contact night-coverage [email or web address] 12/15/2019, 7:08 PM

## 2019-12-15 NOTE — Progress Notes (Signed)
ANTICOAGULATION CONSULT NOTE - Follow Up Consult  Pharmacy Consult for Heparin Indication: atrial fibrillation  Allergies  Allergen Reactions  . Atorvastatin Other (See Comments)    Dizziness.  . Sulfa Antibiotics   . Ace Inhibitors Rash  . Amoxicillin Rash  . Meperidine Rash  . Naproxen Sodium Rash  . Sulfamethoxazole Rash  . Tetracycline Rash    Patient Measurements: Height: 5\' 10"  (177.8 cm) Weight: 125.6 kg (277 lb) IBW/kg (Calculated) : 73 Heparin Dosing Weight: 101.6 kg  Vital Signs: BP: 138/83 (09/08 0946) Pulse Rate: 97 (09/08 0946)  Labs: Recent Labs    12/14/19 0800 12/14/19 1102 12/15/19 0126 12/15/19 0403  HGB 18.4*  --   --  17.8*  HCT 57.0*  --   --  54.6*  PLT 254  --   --  253  APTT  --  52*  --   --   LABPROT  --  16.1*  --   --   INR  --  1.3*  --   --   HEPARINUNFRC  --   --  0.74*  --   CREATININE 1.56*  --   --  1.74*    Estimated Creatinine Clearance: 44.3 mL/min (A) (by C-G formula based on SCr of 1.74 mg/dL (H)).   Medications:  Scheduled:  . furosemide  20 mg Intravenous Daily  . insulin aspart  0-15 Units Subcutaneous TID WC  . insulin glargine  10 Units Subcutaneous Daily  . metoprolol tartrate  5 mg Intravenous Q12H  . sodium chloride flush  3 mL Intravenous Q12H   Infusions:  . heparin 1,350 Units/hr (12/15/19 0226)    Assessment: 83 y/oM on Pradaxa PTA for a-fib admitted for recurrent SBO vs. partial obstruction. Pharmacy consulted to start IV heparin infusion while patient NPO.   Today, 12/15/2019: Heparin level 0.23, subtherapeutic on heparin 1350 units/hr - recent level of > 2.2 was erroneous lab, contaminated with heparin - initial heparin level of 0.74 was supratherpeutic on heparin 1500 units/hr (no initial bolus due to recent Pradaxa) CBC: Hgb elevated, Plt wnl No bleeding or complications reported.  Goal of Therapy:  Heparin level 0.3-0.7 units/ml Monitor platelets by anticoagulation protocol: Yes   Plan:   Increase to heparin IV infusion at 1450 units/hr Heparin level 8 hours after starting Daily heparin level and CBC   Gretta Arab PharmD, BCPS Clinical Pharmacist [facility] main pharmacy [phone] 12/15/2019 9:50 AM

## 2019-12-15 NOTE — Progress Notes (Signed)
Inpatient Diabetes Program Recommendations  AACE/ADA: New Consensus Statement on Inpatient Glycemic Control (2015)  Target Ranges:  Prepandial:   less than 140 mg/dL      Peak postprandial:   less than 180 mg/dL (1-2 hours)      Critically ill patients:  140 - 180 mg/dL   Lab Results  Component Value Date   GLUCAP 224 (H) 12/15/2019   HGBA1C 7.1 (H) 12/14/2019    Review of Glycemic Control Results for KOSEI, RHODES (MRN [id number]) as of 12/15/2019 10:43  Ref. Range 12/14/2019 20:33 12/14/2019 23:25 12/15/2019 07:57  Glucose-Capillary Latest Ref Range: 70 - 99 mg/dL 195 (H) 235 (H) 224 (H)   SBO Diabetes history: DM 2 Outpatient Diabetes medications: Jardiance 25 mg Daily, Kombiglyze 08-998 mg Daily Current orders for Inpatient glycemic control:  Lantus 10 units Novolog 0-15 units tid  Inpatient Diabetes Program Recommendations:    -  Increase Lantus to 15 units  Thanks,  Tama Headings RN, MSN, BC-ADM Inpatient Diabetes Coordinator Team Pager [phone] (8a-5p)

## 2019-12-15 NOTE — Progress Notes (Signed)
ANTICOAGULATION CONSULT NOTE - Follow Up Consult  Pharmacy Consult for Heparin Indication: atrial fibrillation  Allergies  Allergen Reactions  . Atorvastatin Other (See Comments)    Dizziness.  . Sulfa Antibiotics   . Ace Inhibitors Rash  . Amoxicillin Rash  . Meperidine Rash  . Naproxen Sodium Rash  . Sulfamethoxazole Rash  . Tetracycline Rash    Patient Measurements: Height: 5\' 11"  (180.3 cm) Weight: 122 kg (268 lb 15.4 oz) IBW/kg (Calculated) : 75.3 Heparin Dosing Weight: 101.6 kg  Vital Signs: Temp: 98 F (36.7 C) (09/08 2213) Temp Source: Oral (09/08 1843) BP: 153/91 (09/08 2213) Pulse Rate: 100 (09/08 2213)  Labs: Recent Labs    12/14/19 0800 12/14/19 1102 12/15/19 0126 12/15/19 0403 12/15/19 1100 12/15/19 1323 12/15/19 2241  HGB 18.4*  --   --  17.8*  --   --   --   HCT 57.0*  --   --  54.6*  --   --   --   PLT 254  --   --  253  --   --   --   APTT  --  52*  --   --   --   --   --   LABPROT  --  16.1*  --   --   --   --   --   INR  --  1.3*  --   --   --   --   --   HEPARINUNFRC  --   --    < >  --  >2.20* 0.23* 0.22*  CREATININE 1.56*  --   --  1.74*  --   --   --    < > = values in this interval not displayed.    Estimated Creatinine Clearance: 44.3 mL/min (A) (by C-G formula based on SCr of 1.74 mg/dL (H)).   Medications:  Scheduled:  . insulin aspart  0-15 Units Subcutaneous TID WC  . insulin glargine  10 Units Subcutaneous Daily  . metoprolol tartrate  5 mg Intravenous Q12H  . sodium chloride flush  3 mL Intravenous Q12H   Infusions:  . heparin 1,450 Units/hr (12/15/19 1418)  . lactated ringers 75 mL/hr at 12/15/19 2037    Assessment: 83 y/oM on Pradaxa PTA for a-fib admitted for recurrent SBO vs. partial obstruction. Pharmacy consulted to start IV heparin infusion while patient NPO.   Today, 12/15/2019:  Heparin level 0.22, subtherapeutic on heparin 1450 units/hr  CBC: pending with AM labs   No bleeding or complications  reported.  Goal of Therapy:  Heparin level 0.3-0.7 units/ml Monitor platelets by anticoagulation protocol: Yes   Plan:   Increase to heparin IV infusion at 1600 units/hr  Heparin level 8 hours after rate change   Daily heparin level and CBC  Monitor for signs and symptoms of bleeding     Royetta Asal, PharmD, BCPS 12/16/2019 1:04 AM

## 2019-12-15 NOTE — Progress Notes (Signed)
ANTICOAGULATION CONSULT NOTE -  Consult  Pharmacy Consult for IV heparin Indication: atrial fibrillation  Allergies  Allergen Reactions  . Atorvastatin Other (See Comments)    Dizziness.  . Sulfa Antibiotics   . Ace Inhibitors Rash  . Amoxicillin Rash  . Meperidine Rash  . Naproxen Sodium Rash  . Sulfamethoxazole Rash  . Tetracycline Rash    Patient Measurements: Height: 5\' 10"  (177.8 cm) Weight: 125.6 kg (277 lb) IBW/kg (Calculated) : 73 Heparin Dosing Weight: 101.6 kg  Vital Signs: BP: 137/90 (09/07 2325) Pulse Rate: 63 (09/07 2325)  Labs: Recent Labs    12/14/19 0800 12/14/19 1102 12/15/19 0126  HGB 18.4*  --   --   HCT 57.0*  --   --   PLT 254  --   --   APTT  --  52*  --   LABPROT  --  16.1*  --   INR  --  1.3*  --   HEPARINUNFRC  --   --  0.74*  CREATININE 1.56*  --   --     Estimated Creatinine Clearance: 49.4 mL/min (A) (by C-G formula based on SCr of 1.56 mg/dL (H)).   Medical History: Past Medical History:  Diagnosis Date  . Old MI (myocardial infarction)     Medications:  PTA Pradaxa 150mg  PO BID-last dose 12/13/19 at 1930  Assessment: 83 y/oM on Pradaxa PTA for a-fib admitted for recurrent SBO vs. partial obstruction. Pharmacy consulted to start IV heparin infusion while patient NPO. CBC: Hgb 18.4, Pltc WNL at 254K. PT/INR 16.1/1.3, aPTT 52 seconds.   Today, 12/15/19  HL 0.74, supratherpeutic  CBC pending with AM labs   No line or bleeding issues per RN     Goal of Therapy:  Heparin level 0.3-0.7 units/ml Monitor platelets by anticoagulation protocol: Yes   Plan:   Decrease heparin infusion to 1350 units/hr   Obtain heparin level 8 hours after initiation   Daily CBC, heparin level  Monitor closely for s/sx of bleeding    Royetta Asal, PharmD, BCPS 12/15/2019 2:46 AM

## 2019-12-16 ENCOUNTER — Inpatient Hospital Stay (HOSPITAL_COMMUNITY): Payer: Medicare PPO

## 2019-12-16 DIAGNOSIS — I351 Nonrheumatic aortic (valve) insufficiency: Secondary | ICD-10-CM

## 2019-12-16 LAB — BASIC METABOLIC PANEL
Anion gap: 14 (ref 5–15)
BUN: 33 mg/dL — ABNORMAL HIGH (ref 8–23)
CO2: 28 mmol/L (ref 22–32)
Calcium: 8.6 mg/dL — ABNORMAL LOW (ref 8.9–10.3)
Chloride: 98 mmol/L (ref 98–111)
Creatinine, Ser: 1.48 mg/dL — ABNORMAL HIGH (ref 0.61–1.24)
GFR calc Af Amer: 51 mL/min — ABNORMAL LOW (ref >60)
GFR calc non Af Amer: 44 mL/min — ABNORMAL LOW (ref >60)
Glucose, Bld: 141 mg/dL — ABNORMAL HIGH (ref 70–99)
Potassium: 3.9 mmol/L (ref 3.5–5.1)
Sodium: 140 mmol/L (ref 135–145)

## 2019-12-16 LAB — URINE CULTURE: Culture: >=100000 — AB

## 2019-12-16 LAB — CBC
HCT: 52.4 % — ABNORMAL HIGH (ref 39.0–52.0)
Hemoglobin: 16.5 g/dL (ref 13.0–17.0)
MCH: 28.2 pg (ref 26.0–34.0)
MCHC: 31.5 g/dL (ref 30.0–36.0)
MCV: 89.6 fL (ref 80.0–100.0)
Platelets: 196 10*3/uL (ref 150–400)
RBC: 5.85 MIL/uL — ABNORMAL HIGH (ref 4.22–5.81)
RDW: 14.4 % (ref 11.5–15.5)
WBC: 8.6 10*3/uL (ref 4.0–10.5)
nRBC: 0 % (ref 0.0–0.2)

## 2019-12-16 LAB — HEPARIN LEVEL (UNFRACTIONATED)
Heparin Unfractionated: 0.47 IU/mL (ref 0.30–0.70)
Heparin Unfractionated: 0.32 IU/mL (ref 0.30–0.70)

## 2019-12-16 LAB — GLUCOSE, CAPILLARY
Glucose-Capillary: 149 mg/dL — ABNORMAL HIGH (ref 70–99)
Glucose-Capillary: 136 mg/dL — ABNORMAL HIGH (ref 70–99)
Glucose-Capillary: 133 mg/dL — ABNORMAL HIGH (ref 70–99)
Glucose-Capillary: 152 mg/dL — ABNORMAL HIGH (ref 70–99)

## 2019-12-16 LAB — ECHOCARDIOGRAM COMPLETE
Height: 71 in
S' Lateral: 4.02 cm
Weight: 4303.38 oz

## 2019-12-16 MED ORDER — PERFLUTREN LIPID MICROSPHERE
1.0000 mL | INTRAVENOUS | Status: AC | PRN
  Administered 2019-12-16: 3 mL via INTRAVENOUS
  Filled 2019-12-16: qty 10

## 2019-12-16 MED ORDER — PANTOPRAZOLE SODIUM 40 MG IV SOLR
40.0000 mg | Freq: Two times a day (BID) | INTRAVENOUS | Status: DC
  Administered 2019-12-16 (×2): 40 mg via INTRAVENOUS
  Filled 2019-12-16 (×3): qty 40

## 2019-12-16 NOTE — Progress Notes (Signed)
Initial Nutrition Assessment  DOCUMENTATION CODES:   Obesity unspecified  INTERVENTION:   -Will monitor for diet advancement and need for supplements  NUTRITION DIAGNOSIS:   Inadequate oral intake related to  (SBO) as evidenced by meal completion < 25%.  GOAL:   Patient will meet greater than or equal to 90% of their needs  MONITOR:   PO intake, Supplement acceptance, Diet advancement, Labs, Weight trends, I & O's  REASON FOR ASSESSMENT:   Malnutrition Screening Tool    ASSESSMENT:   84 year old male with past medical history of chronic atrial fibrillation on Pradaxa, CKD 3 a, orthostatic hypotension, 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  Patient with SBO, diet was advanced to clears today (liquids from unit only). NGT clamped. Will monitor PO intakes once diet is advanced.  Per weight records, pt has lost 8 lbs since 3/12, which is insignificant for time frame.  I/Os: -4.7L since admit UOP: 575 ml x 24 hrs  Medications reviewed. Labs reviewed: CBGs: 136  NUTRITION - FOCUSED PHYSICAL EXAM:  Unable to complete  Diet Order:   Diet Order            Diet clear liquid Room service appropriate? No; Fluid consistency: Thin  Diet effective now                 EDUCATION NEEDS:   No education needs have been identified at this time  Skin:   RN assessment reviewed  Last BM:  9/8 -type 2  Height:   Ht Readings from Last 1 Encounters:  12/15/19 5\' 11"  (1.803 m)    Weight:   Wt Readings from Last 1 Encounters:  12/15/19 122 kg   BMI:  Body mass index is 37.51 kg/m.  Estimated Nutritional Needs:   Kcal:  2300-2500  Protein:  110-120g  Fluid:  2.3L/day  Clayton Bibles, MS, RD, LDN Inpatient Clinical Dietitian Contact information available via Amion

## 2019-12-16 NOTE — Progress Notes (Signed)
    CC: Abdominal pain and distention  Subjective: Patient is feeling much better this a.m. He had a large bowel movement yesterday. NG drainage is markedly improved. His abdomen is softer, no longer distended. NG drainage is coffee-ground, and most likely irritation from the NG tube.  Objective: Vital signs in last 24 hours: Temp:  [98 F (36.7 C)-98.6 F (37 C)] 98 F (36.7 C) (09/08 2213) Pulse Rate:  [50-110] 110 (09/09 0541) Resp:  [16-19] 19 (09/09 0541) BP: (138-154)/(54-105) 138/105 (09/09 0541) SpO2:  [90 %-98 %] 95 % (09/09 0541) Weight:  [007 kg] 122 kg (09/08 1325) Last BM Date: 12/15/19 1019 IV Urine 400 NG 1000 down from 4200 yesterday Afebrile, BP elevated still, HR also variable 60-110 Glucose 127- 235 BMP shows glucose of 141, BUN 33, creatinine 1.48, WBC 8.6, H/H 16.5/52.4, platelets 196K AXR this AM:  No evidence of obstruction or ileus, NG in stomach  Intake/Output from previous day: 09/08 0701 - 09/09 0700 In: 1019.3 [I.V.:1019.3] Out: 1401 [Urine:400; Emesis/NG output:1000; Stool:1] Intake/Output this shift: Total I/O In: -  Out: 175 [Urine:175]  General appearance: alert, cooperative and no distress Resp: clear to auscultation bilaterally GI: soft, non-tender; bowel sounds normal; no masses,  no organomegaly and Marked improvement from 48 hours ago.  Lab Results:  Recent Labs    12/14/19 0800 12/15/19 0403  WBC 13.1* 8.9  HGB 18.4* 17.8*  HCT 57.0* 54.6*  PLT 254 253    BMET Recent Labs    12/14/19 0800 12/15/19 0403  NA 137 142  K 4.2 4.8  CL 93* 98  CO2 27 27  GLUCOSE 252* 246*  BUN 29* 32*  CREATININE 1.56* 1.74*  CALCIUM 9.8 9.6   PT/INR Recent Labs    12/14/19 1102  LABPROT 16.1*  INR 1.3*    Recent Labs  Lab 12/14/19 0800  AST 24  ALT 21  ALKPHOS 57  BILITOT 1.6*  PROT 8.0  ALBUMIN 4.5     Lipase     Component Value Date/Time   LIPASE 27 12/14/2019 0800     Medications: . insulin aspart  0-15  Units Subcutaneous TID WC  . insulin glargine  10 Units Subcutaneous Daily  . metoprolol tartrate  5 mg Intravenous Q12H  . sodium chloride flush  3 mL Intravenous Q12H    Assessment/Plan Small bowel Hx lymphoma was noted 84 year old male HxMI (myocardial infarction)/CAD Hx atrial fibrillation on Pradaxa- currently uncertain of last dose timing Hx type 2 diabetes Stage III chronic kidney disease COPD/sleep apnea Hx hypertension with heart failure - Dr. 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  If he does really well DC NG later today or in AM. Dr. Earnest Conroy placing on PPI.  Will work on mobilizing. PT eval for walking.    LOS: 2 days    Rachele Lamaster 12/16/2019 Please see Amion

## 2019-12-16 NOTE — Evaluation (Signed)
Physical Therapy Evaluation Patient Details Name: Shawn Blanchard MRN: [id number] DOB: 1935/12/29 Today's Date: 12/16/2019   History of Present Illness  84 year old male with past medical history of chronic atrial fibrillation on Pradaxa, CKD 3 a, orthostatic hypotension, lymphoma s/p ex lap and small bowel resection, cholecystectomy, open lysis of adhesions, prior SBO, CAD, hyperlipidemia, hypertensive heart disease with CHF, diabetes and admitted for recurrent small bowel obstruction with SIRS present on admission  Clinical Impression  Pt admitted with above diagnosis.  Pt currently with functional limitations due to the deficits listed below (see PT Problem List). Pt will benefit from skilled PT to increase their independence and safety with mobility to allow discharge to the venue listed below.  Pt pleasant and agreeable to ambulate.  Pt ambulated around unit with IV pole and his cane. Pt declines RW at this time.  Pt encouraged to ambulate with staff.  Pt anticipates d/c home tomorrow.     Follow Up Recommendations No PT follow up    Equipment Recommendations  None recommended by PT    Recommendations for Other Services       Precautions / Restrictions Precautions Precaution Comments: NG tube      Mobility  Bed Mobility Overal bed mobility: Needs Assistance Bed Mobility: Supine to Sit;Sit to Supine     Supine to sit: Supervision Sit to supine: Supervision      Transfers Overall transfer level: Needs assistance Equipment used: Straight cane Transfers: Sit to/from Stand Sit to Stand: Supervision            Ambulation/Gait Ambulation/Gait assistance: Min guard;Supervision Gait Distance (Feet): 400 Feet Assistive device: IV Pole;Straight cane Gait Pattern/deviations: Step-through pattern;Decreased stride length     General Gait Details: slow but steady pace, pt declined using RW, utilized cane and IV Pole, tolerated distance well, denies any  symptoms  Stairs            Wheelchair Mobility    Modified Rankin (Stroke Patients Only)       Balance Overall balance assessment: Needs assistance         Standing balance support: No upper extremity supported Standing balance-Leahy Scale: Fair                               Pertinent Vitals/Pain Pain Assessment: No/denies pain    Home Living Family/patient expects to be discharged to:: Private residence Living Arrangements: Spouse/significant other   Type of Home: House       Home Layout: Able to live on main level with bedroom/bathroom Home Equipment: Cane - single point      Prior Function Level of Independence: Independent with assistive device(s)         Comments: uses cane, drives, goes to [employer] 3x/week, avoids stairs     Hand Dominance        Extremity/Trunk Assessment        Lower Extremity Assessment Lower Extremity Assessment: Overall WFL for tasks assessed (reports hx of peripheral neuropathy)    Cervical / Trunk Assessment Cervical / Trunk Assessment: Normal  Communication   Communication: No difficulties  Cognition Arousal/Alertness: Awake/alert Behavior During Therapy: WFL for tasks assessed/performed Overall Cognitive Status: Within Functional Limits for tasks assessed                                        General Comments  General comments (skin integrity, edema, etc.): Pt reports last fall was Oct 2020 on a curb    Exercises     Assessment/Plan    PT Assessment Patient needs continued PT services  PT Problem List Decreased activity tolerance;Decreased knowledge of use of DME;Decreased mobility;Decreased balance       PT Treatment Interventions DME instruction;Gait training;Therapeutic exercise;Balance training;Stair training;Functional mobility training;Therapeutic activities;Patient/family education    PT Goals (Current goals can be found in the Care Plan section)  Acute Rehab PT  Goals PT Goal Formulation: With patient Time For Goal Achievement: 12/30/19 Potential to Achieve Goals: Good    Frequency Min 3X/week   Barriers to discharge        Co-evaluation               AM-PAC PT "6 Clicks" Mobility  Outcome Measure Help needed turning from your back to your side while in a flat bed without using bedrails?: None Help needed moving from lying on your back to sitting on the side of a flat bed without using bedrails?: None Help needed moving to and from a bed to a chair (including a wheelchair)?: None Help needed standing up from a chair using your arms (e.g., wheelchair or bedside chair)?: A Little Help needed to walk in hospital room?: A Little Help needed climbing 3-5 steps with a railing? : A Little 6 Click Score: 21    End of Session   Activity Tolerance: Patient tolerated treatment well Patient left: with call bell/phone within reach;with bed alarm set;in bed   PT Visit Diagnosis: Other abnormalities of gait and mobility (R26.89)    Time: 7185-5015 PT Time Calculation (min) (ACUTE ONLY): 12 min   Charges:   PT Evaluation $PT Eval Low Complexity: 1 Low        Kati PT, DPT Acute Rehabilitation Services Pager: [phone] Office: [phone]  York Ram E 12/16/2019, 4:12 PM

## 2019-12-16 NOTE — Progress Notes (Signed)
ANTICOAGULATION CONSULT NOTE - Follow Up Consult  Pharmacy Consult for Heparin Indication: atrial fibrillation  Allergies  Allergen Reactions  . Atorvastatin Other (See Comments)    Dizziness.  . Sulfa Antibiotics   . Ace Inhibitors Rash  . Amoxicillin Rash  . Meperidine Rash  . Naproxen Sodium Rash  . Sulfamethoxazole Rash  . Tetracycline Rash   Patient Measurements: Height: 5\' 11"  (180.3 cm) Weight: 122 kg (268 lb 15.4 oz) IBW/kg (Calculated) : 75.3 Heparin Dosing Weight: 101.6 kg  Vital Signs: Temp: 97.8 F (36.6 C) (09/09 1251) Temp Source: Oral (09/09 1251) BP: 177/80 (09/09 1251) Pulse Rate: 80 (09/09 1251)  Labs: Recent Labs     0000 12/14/19 0800 12/14/19 1102 12/15/19 0126 12/15/19 0403 12/15/19 1100 12/15/19 2241 12/16/19 0950 12/16/19 1824  HGB   < > 18.4*  --   --  17.8*  --   --  16.5  --   HCT  --  57.0*  --   --  54.6*  --   --  52.4*  --   PLT  --  254  --   --  253  --   --  196  --   APTT  --   --  52*  --   --   --   --   --   --   LABPROT  --   --  16.1*  --   --   --   --   --   --   INR  --   --  1.3*  --   --   --   --   --   --   HEPARINUNFRC  --   --   --    < >  --    < > 0.22* 0.47 0.32  CREATININE  --  1.56*  --   --  1.74*  --   --  1.48*  --    < > = values in this interval not displayed.   Estimated Creatinine Clearance: 52 mL/min (A) (by C-G formula based on SCr of 1.48 mg/dL (H)).  Medications:  Scheduled:  . insulin aspart  0-15 Units Subcutaneous TID WC  . insulin glargine  10 Units Subcutaneous Daily  . metoprolol tartrate  5 mg Intravenous Q12H  . pantoprazole (PROTONIX) IV  40 mg Intravenous Q12H  . sodium chloride flush  3 mL Intravenous Q12H   Infusions:  . heparin 1,600 Units/hr (12/16/19 0609)  . lactated ringers 75 mL/hr at 12/16/19 1351   Assessment: 83 y/oM on Pradaxa PTA for a-fib admitted for recurrent SBO vs. partial obstruction. Pharmacy consulted to start IV heparin infusion while patient NPO.    Today, 12/16/2019: 0950 Heparin level 0.47, therapeutic on heparin 1600 units/hr CBC: Hgb decreased to WNL, Plt wnl No bleeding or complications reported. Confirmatory level at 1824 = 0.32 units/ml, decreased, but remains in therapeutic range  Goal of Therapy:  Heparin level 0.3-0.7 units/ml Monitor platelets by anticoagulation protocol: Yes   Plan:  Increase Heparin infusion to 1700 units/hr Daily heparin level and CBC  Azelyn Batie, [place] [phone] 12/16/2019, 7:10 PM

## 2019-12-16 NOTE — Progress Notes (Signed)
Pt ambulated on the hallway with walker with staff.

## 2019-12-16 NOTE — Progress Notes (Signed)
ANTICOAGULATION CONSULT NOTE - Follow Up Consult  Pharmacy Consult for Heparin Indication: atrial fibrillation  Allergies  Allergen Reactions  . Atorvastatin Other (See Comments)    Dizziness.  . Sulfa Antibiotics   . Ace Inhibitors Rash  . Amoxicillin Rash  . Meperidine Rash  . Naproxen Sodium Rash  . Sulfamethoxazole Rash  . Tetracycline Rash    Patient Measurements: Height: 5\' 11"  (180.3 cm) Weight: 122 kg (268 lb 15.4 oz) IBW/kg (Calculated) : 75.3 Heparin Dosing Weight: 101.6 kg  Vital Signs: Temp: 98 F (36.7 C) (09/08 2213) BP: 138/105 (09/09 0541) Pulse Rate: 110 (09/09 0541)  Labs: Recent Labs    12/14/19 0800 12/14/19 1102 12/15/19 0126 12/15/19 0403 12/15/19 1100 12/15/19 1323 12/15/19 2241  HGB 18.4*  --   --  17.8*  --   --   --   HCT 57.0*  --   --  54.6*  --   --   --   PLT 254  --   --  253  --   --   --   APTT  --  52*  --   --   --   --   --   LABPROT  --  16.1*  --   --   --   --   --   INR  --  1.3*  --   --   --   --   --   HEPARINUNFRC  --   --    < >  --  >2.20* 0.23* 0.22*  CREATININE 1.56*  --   --  1.74*  --   --   --    < > = values in this interval not displayed.    Estimated Creatinine Clearance: 44.3 mL/min (A) (by C-G formula based on SCr of 1.74 mg/dL (H)).   Medications:  Scheduled:  . insulin aspart  0-15 Units Subcutaneous TID WC  . insulin glargine  10 Units Subcutaneous Daily  . metoprolol tartrate  5 mg Intravenous Q12H  . sodium chloride flush  3 mL Intravenous Q12H   Infusions:  . heparin 1,600 Units/hr (12/16/19 0609)  . lactated ringers 75 mL/hr at 12/15/19 2037    Assessment: 83 y/oM on Pradaxa PTA for a-fib admitted for recurrent SBO vs. partial obstruction. Pharmacy consulted to start IV heparin infusion while patient NPO.   Today, 12/16/2019: Heparin level 0.47, therapeutic on heparin 1600 units/hr CBC: Hgb decreased to WNL, Plt wnl No bleeding or complications reported.  Goal of Therapy:  Heparin  level 0.3-0.7 units/ml Monitor platelets by anticoagulation protocol: Yes   Plan:  Continue heparin IV infusion at 1600 units/hr Heparin level in 8 hours to confirm therapeutic level Daily heparin level and CBC   Gretta Arab PharmD, BCPS Clinical Pharmacist [facility] main pharmacy [phone] 12/16/2019 7:55 AM

## 2019-12-16 NOTE — Progress Notes (Addendum)
PROGRESS NOTE    Shawn Blanchard  YCX:[id number]  DOB: Jul 23, 1935  PCP: Algis Greenhouse, MD Admit date:12/14/2019 Chief compliant: Abdominal pain, distention 84 year old male with past medical history of chronic atrial fibrillation on Pradaxa, CKD 3 a, orthostatic hypotension, 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  Not associated with nausea and vomiting. ED Course: Afebrile. Notable labs: Glucose 252, creatinine 1.56 (previously 1.31 in February), AG 17, T bili 1.6, WBC 13.1, Hb 18.4. UA with: 5 ketones, rare bacteria 500 glucose, RBC 11-20, WBC 21-50.  CT abdomen pelvis with contrast: Early complete versus partial small bowel obstruction with similar appearance to study of April 10, 2016 showing signs of enteritis and evidence of prior small-bowel resection. Transition point at a similar location in the RIGHT hemiabdomen; Moderate-size fat containing RIGHT inguinal hernia and small fat containing LEFT inguinal hernia.   Hospital course: Patient admitted to [facility] with general surgery consultation.  NG tube placed for decompression and medical management.  Subjective: Patient appears in good spirits, reports "a large BM" yesterday afternoon. NG tube appears to be draining coffee grounds.  General surgery APP in the room during rounds.  Wife at bedside.  Objective: Vitals:   12/15/19 1325 12/15/19 1843 12/15/19 2213 12/16/19 0541  BP: (!) 146/64 (!) 154/54 (!) 153/91 (!) 138/105  Pulse: 63 (!) 50 100 (!) 110  Resp: 16 19 19 19   Temp: 98.6 F (37 C) 98.5 F (36.9 C) 98 F (36.7 C)   TempSrc: Oral Oral    SpO2: 98% 94% 94% 95%  Weight: 122 kg     Height: 5\' 11"  (1.803 m)       Intake/Output Summary (Last 24 hours) at 12/16/2019 1242 Last data filed at 12/16/2019 0738 Gross per 24 hour  Intake  1019.25 ml  Output 1576 ml  Net -556.75 ml   Filed Weights   12/14/19 1119 12/15/19 1325  Weight: 125.6 kg 122 kg    Physical Examination:  General: Heavy built, no acute distress noted,  Head ENT:NG tube in place with coffee grounds, PERRLA, neck supple Heart: S1-S2 heard, regular rate and rhythm, no murmurs.  No leg edema noted Lungs: Equal air entry bilaterally, no rhonchi or rales on exam, no accessory muscle use Abdomen: Improved distention, tympanic, bowel sounds not heard,  extremities: No pedal edema.  No cyanosis or clubbing. Neurological: Awake alert oriented x3, no focal weakness or numbness, strength and sensations to crude touch intact Skin: No wounds or rashes.   Data Reviewed: I have personally reviewed following labs and imaging studies  CBC: Recent Labs  Lab 12/14/19 0800 12/15/19 0403 12/16/19 0950  WBC 13.1* 8.9 8.6  HGB 18.4* 17.8* 16.5  HCT 57.0* 54.6* 52.4*  MCV 86.8 87.2 89.6  PLT 254 253 497   Basic Metabolic Panel: Recent Labs  Lab 12/14/19 0800 12/15/19 0403 12/16/19 0950  NA 137 142 140  K 4.2 4.8 3.9  CL 93* 98 98  CO2 27 27 28   GLUCOSE 252* 246* 141*  BUN 29* 32* 33*  CREATININE 1.56* 1.74* 1.48*  CALCIUM 9.8 9.6 8.6*   GFR: Estimated Creatinine Clearance: 52 mL/min (A) (by C-G formula based on SCr of 1.48 mg/dL (H)). Liver Function Tests: Recent Labs  Lab 12/14/19 0800  AST 24  ALT 21  ALKPHOS 57  BILITOT 1.6*  PROT 8.0  ALBUMIN 4.5   Recent Labs  Lab 12/14/19 0800  LIPASE 27   No results for input(s): AMMONIA in the last 168 hours. Coagulation Profile: Recent Labs  Lab 12/14/19 1102  INR 1.3*   Cardiac Enzymes: No results for input(s): CKTOTAL, CKMB, CKMBINDEX, TROPONINI in the last 168 hours. BNP (last 3 results) No results for input(s): PROBNP in the last 8760 hours. HbA1C: Recent Labs    12/14/19 0800  HGBA1C 7.1*   CBG: Recent Labs  Lab 12/15/19 1242 12/15/19 1700 12/15/19 2056 12/16/19 0731  12/16/19 1155  GLUCAP 198* 159* 127* 149* 136*   Lipid Profile: No results for input(s): CHOL, HDL, LDLCALC, TRIG, CHOLHDL, LDLDIRECT in the last 72 hours. Thyroid Function Tests: No results for input(s): TSH, T4TOTAL, FREET4, T3FREE, THYROIDAB in the last 72 hours. Anemia Panel: No results for input(s): VITAMINB12, FOLATE, FERRITIN, TIBC, IRON, RETICCTPCT in the last 72 hours. Sepsis Labs: Recent Labs  Lab 12/14/19 1410  LATICACIDVEN 2.2*    Recent Results (from the past 240 hour(s))  Culture, Urine     Status: Abnormal   Collection Time: 12/14/19 10:53 AM   Specimen: Urine, Clean Catch  Result Value Ref Range Status   Specimen Description   Final    URINE, CLEAN CATCH Performed at [facility], [place] [address], [place], [place] [address]    Special Requests   Final    NONE Performed at [facility], [place] [address] [place], [place] [address]    Culture >=100,000 COLONIES/mL ENTEROCOCCUS FAECALIS (A)  Final   Report Status 12/16/2019 FINAL  Final   Organism ID, Bacteria ENTEROCOCCUS FAECALIS (A)  Final      Susceptibility   Enterococcus faecalis - MIC*    AMPICILLIN 4 SENSITIVE Sensitive     NITROFURANTOIN <=16 SENSITIVE Sensitive     VANCOMYCIN 1 SENSITIVE Sensitive     * >=100,000 COLONIES/mL ENTEROCOCCUS FAECALIS  SARS Coronavirus 2 by RT PCR (hospital order, performed in [place] hospital lab) Nasopharyngeal Nasopharyngeal Swab     Status: None   Collection Time: 12/14/19  1:07 PM   Specimen: Nasopharyngeal Swab  Result Value Ref Range Status   SARS Coronavirus 2 NEGATIVE NEGATIVE Final    Comment: (NOTE) SARS-CoV-2 target nucleic acids are NOT DETECTED.  The SARS-CoV-2 RNA is generally detectable in upper and lower respiratory specimens during the acute phase of infection. The lowest concentration of SARS-CoV-2 viral copies this assay can detect is 250 copies / mL. A negative result does not preclude SARS-CoV-2  infection and should not be used as the sole basis for treatment or other patient management decisions.  A negative result may occur with improper specimen collection / handling, submission of specimen other than nasopharyngeal swab, presence of viral mutation(s) within the areas targeted by this assay, and inadequate number of viral copies (<250 copies / mL). A negative result must be combined with clinical observations, patient history, and epidemiological information.  Fact Sheet for Patients:   StrictlyIdeas.no  Fact Sheet for Healthcare Providers: BankingDealers.co.za  This test is not yet approved or  cleared by the [place] FDA and has been authorized for detection and/or diagnosis of SARS-CoV-2 by FDA under an Emergency Use Authorization (EUA).  This EUA will remain in effect (meaning this test can be used) for the duration of the COVID-19 declaration under Section 564(b)(1) of the Act, 21 U.S.C. section 360bbb-3(b)(1), unless the authorization is terminated or revoked sooner.  Performed at [facility], [address] [place], [place] [address]       Radiology Studies: DG Abdomen 1 View  Result Date: 12/15/2019 CLINICAL DATA:  Reinsertion of NG tube EXAM: ABDOMEN - 1 VIEW COMPARISON:  12/14/2019 FINDINGS: Enteric tube is present with tip coiled in the left upper quadrant consistent with location in the body of the stomach. Mild gaseous distention of the stomach is suggested. IMPRESSION: Enteric tube tip is coiled in the left upper quadrant consistent with location in the body of the stomach. Electronically Signed   By: Lucienne Capers M.D.   On: 12/15/2019 03:33   DG Abdomen 1 View  Result Date: 12/14/2019 CLINICAL DATA:  NG tube placement EXAM: ABDOMEN - 1 VIEW COMPARISON:  12/14/2019 FINDINGS: The NG tube tip projects over the gastric body. The tip is pointed distally. The visualized bowel gas pattern  is unremarkable. IMPRESSION: NG tube projects over the gastric body. Electronically Signed   By: Constance Holster M.D.   On: 12/14/2019 23:07   DG Abdomen 1 View  Result Date: 12/14/2019 CLINICAL DATA:  Enteric tube placement. EXAM: ABDOMEN - 1 VIEW COMPARISON:  Abdominal x-ray from same day. FINDINGS: Enteric tube tip in the stomach with the distal side port in the lower esophagus. The bowel gas pattern is normal. No radio-opaque calculi or other significant radiographic abnormality are seen. IMPRESSION: 1. Enteric tube in the stomach with the distal side port in the lower esophagus. Recommend advancing 8 cm. Electronically Signed   By: Titus Dubin M.D.   On: 12/14/2019 21:08   DG Abd Portable 1V  Result Date: 12/16/2019 CLINICAL DATA:  Small bowel obstruction. EXAM: PORTABLE ABDOMEN - 1 VIEW COMPARISON:  December 15, 2019. FINDINGS: The bowel gas pattern is normal. Nasogastric tube tip is seen in proximal stomach. Status post cholecystectomy. Residual contrast is noted in the colon. No radio-opaque calculi or other significant radiographic abnormality are seen. IMPRESSION: No evidence of bowel obstruction or ileus. Nasogastric tube tip seen in proximal stomach. Electronically Signed   By: Marijo Conception M.D.   On: 12/16/2019 08:08   DG Abd Portable 1V-Small Bowel Obstruction Protocol-initial, 8 hr delay  Result Date: 12/15/2019 CLINICAL DATA:  Small-bowel obstruction EXAM: PORTABLE ABDOMEN - 1 VIEW COMPARISON:  12/15/2019, 12/14/2019 FINDINGS: Nasogastric tube courses below the diaphragm with distal tip and side port coiled within the gastric body/fundus. Mildly dilated loops of small bowel within the abdomen measuring up to 3.9 cm in diameter, similar to prior. No gross free intraperitoneal air. IMPRESSION: 1. Similar degree of small bowel dilation compared to prior. 2. Nasogastric tube courses below the diaphragm with distal tip and side port coiled within the gastric body/fundus. Electronically  Signed   By: Davina Poke D.O.   On: 12/15/2019 08:02   DG Abd Portable 1V-Small Bowel Protocol-Position Verification  Result Date: 12/14/2019 CLINICAL DATA:  Nasogastric tube placement. EXAM: PORTABLE ABDOMEN - 1 VIEW COMPARISON:  June 17, 2019. FINDINGS: The bowel gas pattern is normal. Distal tip of nasogastric tube is seen in proximal stomach. No radio-opaque calculi or other significant radiographic abnormality are seen. IMPRESSION: Distal tip of nasogastric tube seen in proximal stomach. Electronically Signed   By: Marijo Conception M.D.   On: 12/14/2019 16:36   ECHOCARDIOGRAM COMPLETE  Result Date: 12/16/2019    ECHOCARDIOGRAM REPORT   Patient Name:   Shawn Blanchard Date of Exam: 12/16/2019 Medical Rec #:  [id number]        Height:       71.0  in Accession #:    [id number]       Weight:       269.0 lb Date of Birth:  [birth year]       BSA:          2.391 m Patient Age:    61 years         BP:           138/105 mmHg Patient Gender: M                HR:           110 bpm. Exam Location:  Inpatient Procedure: 2D Echo Indications:    Cardiomyopathy I42.9  History:        Patient has no prior history of Echocardiogram examinations.                 Previous Myocardial Infarction.  Sonographer:    Mikki Santee RDCS (AE) Referring Phys: [id number] Guilford Shi IMPRESSIONS  1. LV visually appears 45-50%. Reduced sensitivity for focal wall motion abnormalities on this study, but appears mildly globally hypokinetic without clear focal features. Left ventricular ejection fraction, by estimation, is 45 to 50%. The left ventricle has mildly decreased function. The left ventricle demonstrates global hypokinesis. There is mild concentric left ventricular hypertrophy. Left ventricular diastolic function could not be evaluated.  2. Right ventricular systolic function is mildly reduced. The right ventricular size is normal. Tricuspid regurgitation signal is inadequate for assessing PA pressure.  3. The mitral valve  is normal in structure. Trivial mitral valve regurgitation. No evidence of mitral stenosis. Moderate mitral annular calcification.  4. The aortic valve is tricuspid. There is moderate calcification of the aortic valve. There is mild thickening of the aortic valve. Aortic valve regurgitation is mild. Mild to moderate aortic valve sclerosis/calcification is present, without any evidence of aortic stenosis.  5. The inferior vena cava is dilated in size with >50% respiratory variability, suggesting right atrial pressure of 8 mmHg. Comparison(s): No prior Echocardiogram. FINDINGS  Left Ventricle: LV visually appears 45-50%. Reduced sensitivity for focal wall motion abnormalities on this study, but appears mildly globally hypokinetic without clear focal features. Left ventricular ejection fraction, by estimation, is 45 to 50%. The  left ventricle has mildly decreased function. The left ventricle demonstrates global hypokinesis. Definity contrast agent was given IV to delineate the left ventricular endocardial borders. The left ventricular internal cavity size was normal in size. There is mild concentric left ventricular hypertrophy. Left ventricular diastolic function could not be evaluated due to atrial fibrillation. Left ventricular diastolic function could not be evaluated. Right Ventricle: The right ventricular size is normal. No increase in right ventricular wall thickness. Right ventricular systolic function is mildly reduced. Tricuspid regurgitation signal is inadequate for assessing PA pressure. Left Atrium: Left atrial size was normal in size. Right Atrium: Right atrial size was normal in size. Pericardium: Trivial pericardial effusion is present. Presence of pericardial fat pad. Mitral Valve: The mitral valve is normal in structure. There is mild thickening of the mitral valve leaflet(s). There is mild calcification of the mitral valve leaflet(s). Moderate mitral annular calcification. Trivial mitral valve  regurgitation. No evidence of mitral valve stenosis. Tricuspid Valve: The tricuspid valve is normal in structure. Tricuspid valve regurgitation is trivial. No evidence of tricuspid stenosis. Aortic Valve: The aortic valve is tricuspid. There is moderate calcification of the aortic valve. There is mild thickening of the aortic valve. Aortic valve regurgitation is mild. Mild to moderate aortic valve  sclerosis/calcification is present, without any evidence of aortic stenosis. Pulmonic Valve: The pulmonic valve was grossly normal. Pulmonic valve regurgitation is trivial. No evidence of pulmonic stenosis. Aorta: The aortic root and ascending aorta are structurally normal, with no evidence of dilitation and the aortic arch was not well visualized. Venous: The inferior vena cava is dilated in size with greater than 50% respiratory variability, suggesting right atrial pressure of 8 mmHg. IAS/Shunts: The atrial septum is grossly normal.  LEFT VENTRICLE PLAX 2D LVIDd:         5.10 cm LVIDs:         4.02 cm LV PW:         1.30 cm LV IVS:        1.30 cm LVOT diam:     2.60 cm LV SV:         91 LV SV Index:   38 LVOT Area:     5.31 cm  RIGHT VENTRICLE RV S prime:     7.38 cm/s TAPSE (M-mode): 1.2 cm LEFT ATRIUM           Index       RIGHT ATRIUM           Index LA diam:      3.70 cm 1.55 cm/m  RA Area:     23.10 cm LA Vol (A2C): 79.6 ml 33.28 ml/m RA Volume:   62.40 ml  26.09 ml/m LA Vol (A4C): 50.3 ml 21.03 ml/m  AORTIC VALVE LVOT Vmax:   84.20 cm/s LVOT Vmean:  52.600 cm/s LVOT VTI:    0.171 m  AORTA Ao Root diam: 3.30 cm  SHUNTS Systemic VTI:  0.17 m Systemic Diam: 2.60 cm Buford Dresser MD Electronically signed by Buford Dresser MD Signature Date/Time: 12/16/2019/12:36:19 PM    Final       Scheduled Meds: . insulin aspart  0-15 Units Subcutaneous TID WC  . insulin glargine  10 Units Subcutaneous Daily  . metoprolol tartrate  5 mg Intravenous Q12H  . pantoprazole (PROTONIX) IV  40 mg Intravenous  Q12H  . sodium chloride flush  3 mL Intravenous Q12H   Continuous Infusions: . heparin 1,600 Units/hr (12/16/19 0609)  . lactated ringers 75 mL/hr at 12/15/19 2037      Assessment/Plan:  1.  Recurrent small bowel obstruction with SIRS present on admission: Afebrile but presented with tachypnea, leukocytosis and mild lactic acidosis which are all likely related to bowel obstruction and dehydration. Afebrile, leucocytosis now resolved with IV hydration. Infection ruled out.  Seen by general surgery and recommended trying conservative medical management, may need surgical intervention if shows no significant improvement.  Currently n.p.o. with IV fluids and NG tube to suction.  Reports bowel movement yesterday afternoon.GI is planning on starting clear liquid diets and clamping NG tube.  May need serial x-rays-defer to surgical service  2.  Chronic atrial fibrillation: Rate controlled.  On Pradaxa at baseline, now on IV heparin while NPO.  Concern for coffee-ground but will continue heparin as hemoglobin relatively stable (other than some dilutional effect) and monitor with IV PPI  3.  Upper GI bleed: NG tube appears to be draining coffee-ground, could be NG trauma related bleeding.  He did have 3 attempts at NG tube placement yesterday.  Will place on IV PPI and monitor hemoglobin closely.  Will hold heparin if condition worsens.  4.AKI on CKD stage IIIa: Baseline creatinine appears to be around 1.3 in February 2021 with GFR around 51.  Creatinine elevated up  to 1.74 and BUN at 32 on admission.  Likely secondary to GI losses and n.p.o. status.  Add IV fluids and creatinine now downtrending.  Hold Lasix .  5. ?  CAD, chronic diastolic CHF: Patient noted to be on aspirin, Coreg and Lasix 20 mg twice daily at baseline.  No echo in record.  Hold Lasix for now given problem #3.  Obtain echo.  6.  Diabetes mellitus: Holding home oral hypoglycemics.  Blood glucose elevated at 235 yesterday and started on  Lantus.  Monitor for hypoglycemia while n.p.o., currently blood glucose around 150    DVT prophylaxis: Heparin Code Status: Full code Family / Patient Communication: Discussed with patient Disposition Plan:   Status is: Inpatient  Remains inpatient appropriate because:Ongoing diagnostic testing needed not appropriate for outpatient work up, IV treatments appropriate due to intensity of illness or inability to take PO and Inpatient level of care appropriate due to severity of illness   Dispo: The patient is from: Home              Anticipated d/c is to: Home              Anticipated d/c date is: 2 days              Patient currently is not medically stable to d/c.           Time spent: 35 mins     >50% time spent in discussions with care team and coordination of care.    Guilford Shi, MD [facility] Pager in [place]  If 7PM-7AM, please contact night-coverage [email or web address] 12/16/2019, 12:42 PM

## 2019-12-16 NOTE — Progress Notes (Signed)
  Echocardiogram 2D Echocardiogram has been performed.  Jennette Dubin 12/16/2019, 11:50 AM

## 2019-12-17 LAB — HEPARIN LEVEL (UNFRACTIONATED): Heparin Unfractionated: 0.29 IU/mL — ABNORMAL LOW (ref 0.30–0.70)

## 2019-12-17 LAB — CBC
HCT: 46.5 % (ref 39.0–52.0)
Hemoglobin: 15 g/dL (ref 13.0–17.0)
MCH: 28.5 pg (ref 26.0–34.0)
MCHC: 32.3 g/dL (ref 30.0–36.0)
MCV: 88.4 fL (ref 80.0–100.0)
Platelets: 155 10*3/uL (ref 150–400)
RBC: 5.26 MIL/uL (ref 4.22–5.81)
RDW: 14.1 % (ref 11.5–15.5)
WBC: 9.9 10*3/uL (ref 4.0–10.5)
nRBC: 0 % (ref 0.0–0.2)

## 2019-12-17 LAB — BASIC METABOLIC PANEL
Anion gap: 12 (ref 5–15)
BUN: 23 mg/dL (ref 8–23)
CO2: 30 mmol/L (ref 22–32)
Calcium: 8.4 mg/dL — ABNORMAL LOW (ref 8.9–10.3)
Chloride: 96 mmol/L — ABNORMAL LOW (ref 98–111)
Creatinine, Ser: 1.26 mg/dL — ABNORMAL HIGH (ref 0.61–1.24)
GFR calc Af Amer: >60 mL/min (ref >60)
GFR calc non Af Amer: 53 mL/min — ABNORMAL LOW (ref >60)
Glucose, Bld: 140 mg/dL — ABNORMAL HIGH (ref 70–99)
Potassium: 3.7 mmol/L (ref 3.5–5.1)
Sodium: 138 mmol/L (ref 135–145)

## 2019-12-17 LAB — GLUCOSE, CAPILLARY
Glucose-Capillary: 132 mg/dL — ABNORMAL HIGH (ref 70–99)
Glucose-Capillary: 184 mg/dL — ABNORMAL HIGH (ref 70–99)

## 2019-12-17 MED ORDER — ONDANSETRON HCL 4 MG PO TABS: 4.0000 mg | ORAL_TABLET | Freq: Four times a day (QID) | ORAL | 0 refills | Status: DC | PRN

## 2019-12-17 MED ORDER — FUROSEMIDE 20 MG PO TABS: 20.0000 mg | ORAL_TABLET | Freq: Every day | ORAL | 3 refills | Status: DC

## 2019-12-17 MED ORDER — PANTOPRAZOLE SODIUM 40 MG PO TBEC: 40.0000 mg | DELAYED_RELEASE_TABLET | Freq: Two times a day (BID) | ORAL | 0 refills | Status: DC

## 2019-12-17 MED ORDER — HEPARIN (PORCINE) 25000 UT/250ML-% IV SOLN: 1800.0000 [IU]/h | INTRAVENOUS | Status: DC

## 2019-12-17 MED ORDER — PANTOPRAZOLE SODIUM 40 MG PO TBEC
40.0000 mg | DELAYED_RELEASE_TABLET | Freq: Two times a day (BID) | ORAL | Status: DC
  Administered 2019-12-17: 40 mg via ORAL
  Filled 2019-12-17: qty 1

## 2019-12-17 MED ORDER — DABIGATRAN ETEXILATE MESYLATE 150 MG PO CAPS
150.0000 mg | ORAL_CAPSULE | Freq: Two times a day (BID) | ORAL | Status: DC
  Administered 2019-12-17: 150 mg via ORAL
  Filled 2019-12-17: qty 1

## 2019-12-17 NOTE — Care Management Important Message (Signed)
Important Message  Patient Details IM Letter given to the Patient Name: Shawn Blanchard MRN: [id number] Date of Birth: Jun 05, 1935   Medicare Important Message Given:  Yes     Kerin Salen 12/17/2019, 11:41 AM

## 2019-12-17 NOTE — Discharge Summary (Signed)
Physician Discharge Summary  Shawn Blanchard ZMO:[id number] DOB: 03-Jun-1935 DOA: 12/14/2019  PCP: Algis Greenhouse, MD  Admit date: 12/14/2019 Discharge date: 12/17/2019 Consultations: General Surgery Admitted From: home Disposition: home  Discharge Diagnoses:  Principal Problem:   SBO (small bowel obstruction) ([place]) Active Problems:   Chronic atrial fibrillation (HCC)   Chronic diastolic heart failure ([place])   Type 2 diabetes mellitus with neurologic complication, without long-term current use of insulin (HCC)   Chronic anticoagulation   Essential hypertension   Hospital Course Summary: 84 year old male with past medical history of chronic atrial fibrillation on Pradaxa, CKD 3a,orthostatic hypotension, 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  Not associated with nausea and vomiting. ED Course: Afebrile.Notable labs: Glucose 252, creatinine 1.56 (previously 1.31 in February), AG 17, T bili 1.6, WBC 13.1, Hb 18.4.UA with: 5 ketones, rare bacteria 500 glucose, RBC 11-20, WBC 21-50. CT abdomen pelvis with contrast:Early complete versus partial small bowel obstruction with similar appearance to study of April 10, 2016 showing signs of enteritis and evidence of prior small-bowel resection. Transition point at a similar location in the RIGHT hemiabdomen;Moderate-size fat containing RIGHT inguinal hernia and small fat containing LEFT inguinal hernia. Hospital course: Patient admitted to [facility] with general surgery consultation.  NG tube placed for decompression and medical management.  1.  Recurrent small bowel obstruction with SIRS present on admission: Afebrile but presented with tachypnea, leukocytosis and mild lactic acidosis which are all likely related to bowel obstruction and dehydration. Afebrile,  leucocytosis now resolved with IV hydration. Infection ruled out.  Seen by general surgery and recommended trying conservative medical management before considering surgical intervention.  Initially kept n.p.o. with IV fluids and NG tube to suction.  His condition improved and he had bowel movement after which NG tube was clamped on 9/9, started on clear liquid diet .  He tolerated soft diet well and is eager to go home.  Seen by general surgery, NG tube discontinued, advanced to soft diet this morning and cleared for discharge.    2.  Chronic atrial fibrillation: Rate controlled.  On Pradaxa at baseline, managed with IV heparin while NPO.    There was concern for coffee-ground output through NG tube on 9/9 most likely related to NG esophageal trauma, but patient was continued on heparin as hemoglobin relatively stable (other than some dilutional effect) and monitored with IV PPI.  His hemoglobin remained stable and NG output appears clear today.  He may resume Pradaxa upon discharge.  3.  Upper GI bleed: NG tube appears to be draining coffee-ground, could be NG trauma related bleeding.  He did have 3 attempts at NG tube placement on admission before success.  Improved with IV PPI and serial hemoglobin relatively stable.    May resume Pradaxa, aspirin but would continue oral PPI upon discharge for few weeks.  Follow-up PCP follow-up repeat labs within few days.  Recommended to hold Pradaxa and return to ED if has recurrent signs of bleeding.  4.AKI on CKD stage IIIa: Baseline creatinine appears to be around 1.3 in February 2021 with GFR around 51.  Creatinine elevated up to 1.74 and BUN at 32 on admission.  Likely secondary to GI losses and n.p.o. status.  Home medication, Lasix 20 mg twice daily, was held, patient received IV fluids and creatinine now downtrending to 1.2.  Patient states he  was taking Lasix for leg swellings and never had pulmonary edema.  Advised to resume Lasix at once daily dosing and  take additional as needed.  Should have repeat BMP through PCP next week.  5. CAD s/p PTCA, chronic combined systolic/diastolic CHF: Patient noted to be on aspirin, Coreg and Lasix at baseline.  No echo in record.  Held Lasix as described above given problem #3.  Obtained echo which shows LVH and reduced EF at 45 to 50%.  Patient may resume diuretics at lower dose and take additional dosing as needed as described above.  6.  Diabetes mellitus: Received low-dose Lantus while n.p.o. here.  Now diet advanced to soft diet.  May resume oral hypoglycemics upon discharge.  Discharge Exam:  Vitals:   12/16/19 0541 12/16/19 1251 12/16/19 2153 12/17/19 0502  BP: (!) 138/105 (!) 177/80 (!) 141/77 123/63  Pulse: (!) 110 80 71 68  Resp: 19 19 19 19   Temp:  97.8 F (36.6 C) 98.1 F (36.7 C) 98.3 F (36.8 C)  TempSrc:  Oral Oral Oral  SpO2: 95% 100% 97% 98%  Weight:      Height:        General: Pt is alert, awake, not in acute distress Cardiovascular: RRR, S1/S2 +, no rubs, no gallops Respiratory: CTA bilaterally, no wheezing, no rhonchi Abdominal: Obese, soft, NT, bowel sounds + Extremities: no edema, no cyanosis  Discharge Condition:Stable CODE STATUS: Full code Diet recommendation: Heart healthy, carb modified Recommendations for Outpatient Follow-up:  1. Follow up with PCP: 5 to 6 days 2. Follow up with consultants: Cardiology in 2 weeks, general surgery as needed 3. Please obtain follow up labs including: CBC, BMP in 5 days  Home Health services upon discharge: None Equipment/Devices upon discharge: None   Discharge Instructions:  Discharge Instructions    (HEART FAILURE PATIENTS) Call MD:  Anytime you have any of the following symptoms: 1) 3 pound weight gain in 24 hours or 5 pounds in 1 week 2) shortness of breath, with or without a dry hacking cough 3) swelling in the hands, feet or stomach 4) if you have to sleep on extra pillows at night in order to breathe.   Complete by: As  directed    Call MD for:  difficulty breathing, headache or visual disturbances   Complete by: As directed    Call MD for:  persistant dizziness or light-headedness   Complete by: As directed    Call MD for:  severe uncontrolled pain   Complete by: As directed    Call MD for:  temperature >100.4   Complete by: As directed    Diet - low sodium heart healthy   Complete by: As directed    Diet Carb Modified   Complete by: As directed    Increase activity slowly   Complete by: As directed      Allergies as of 12/17/2019      Reactions   Atorvastatin Other (See Comments)   Dizziness.   Sulfa Antibiotics    Ace Inhibitors Rash   Amoxicillin Rash   Meperidine Rash   Naproxen Sodium Rash   Sulfamethoxazole Rash   Tetracycline Rash      Medication List    STOP taking these medications   metoprolol succinate 50 MG 24 hr tablet Commonly known as: TOPROL-XL     TAKE these medications   acetaminophen 325 MG tablet Commonly known as: TYLENOL Take 325 mg by mouth every 6 (six) hours as needed for mild pain  or headache.   allopurinol 100 MG tablet Commonly known as: ZYLOPRIM Take 100 mg by mouth 2 (two) times daily as needed (gout).   aspirin EC 81 MG tablet Take 81 mg by mouth daily.   carvedilol 3.125 MG tablet Commonly known as: Coreg Take 1 tablet (3.125 mg total) by mouth 2 (two) times daily with a meal.   furosemide 20 MG tablet Commonly known as: LASIX Take 1 tablet (20 mg total) by mouth daily. What changed: when to take this   glucose blood test strip OneTouch Ultra Blue Test Strip   Jardiance 25 MG Tabs tablet Generic drug: empagliflozin Take 25 mg by mouth daily.   Kombiglyze XR 08-998 MG Tb24 Generic drug: Saxagliptin-Metformin Take 1 tablet by mouth every morning.   melatonin 3 MG Tabs tablet Take 3 mg by mouth at bedtime as needed (sleep).   nitroGLYCERIN 0.4 MG SL tablet Commonly known as: NITROSTAT Place 1 tablet (0.4 mg total) under the  tongue every 5 (five) minutes as needed for chest pain.   ondansetron 4 MG tablet Commonly known as: ZOFRAN Take 1 tablet (4 mg total) by mouth every 6 (six) hours as needed for nausea.   OneTouch Delica Plus [id number] Misc OneTouch Delica Plus Lancet 33 gauge   pantoprazole 40 MG tablet Commonly known as: PROTONIX Take 1 tablet (40 mg total) by mouth 2 (two) times daily for 20 days.   polyvinyl alcohol 1.4 % ophthalmic solution Commonly known as: LIQUIFILM TEARS Place 1 drop into both eyes as needed for dry eyes.   Pradaxa 150 MG Caps capsule Generic drug: dabigatran TAKE 1 CAPSULE BY MOUTH 2 TIMES DAILY What changed: See the new instructions.   pramipexole 0.25 MG tablet Commonly known as: MIRAPEX Take 0.25 mg by mouth 2 (two) times daily.   PROBIOTIC PO Take 1 capsule by mouth daily.   Repatha SureClick 413 MG/ML Soaj Generic drug: Evolocumab Inject 140 mg into the skin 2 (two) times a week.       Allergies  Allergen Reactions   Atorvastatin Other (See Comments)    Dizziness.   Sulfa Antibiotics    Ace Inhibitors Rash   Amoxicillin Rash   Meperidine Rash   Naproxen Sodium Rash   Sulfamethoxazole Rash   Tetracycline Rash      The results of significant diagnostics from this hospitalization (including imaging, microbiology, ancillary and laboratory) are listed below for reference.    Labs: BNP (last 3 results) No results for input(s): BNP in the last 8760 hours. Basic Metabolic Panel: Recent Labs  Lab 12/14/19 0800 12/15/19 0403 12/16/19 0950 12/17/19 0456  NA 137 142 140 138  K 4.2 4.8 3.9 3.7  CL 93* 98 98 96*  CO2 27 27 28 30   GLUCOSE 252* 246* 141* 140*  BUN 29* 32* 33* 23  CREATININE 1.56* 1.74* 1.48* 1.26*  CALCIUM 9.8 9.6 8.6* 8.4*   Liver Function Tests: Recent Labs  Lab 12/14/19 0800  AST 24  ALT 21  ALKPHOS 57  BILITOT 1.6*  PROT 8.0  ALBUMIN 4.5   Recent Labs  Lab 12/14/19 0800  LIPASE 27   No results for  input(s): AMMONIA in the last 168 hours. CBC: Recent Labs  Lab 12/14/19 0800 12/15/19 0403 12/16/19 0950 12/17/19 0456  WBC 13.1* 8.9 8.6 9.9  HGB 18.4* 17.8* 16.5 15.0  HCT 57.0* 54.6* 52.4* 46.5  MCV 86.8 87.2 89.6 88.4  PLT 254 253 196 155   Cardiac Enzymes: No results for input(s):  CKTOTAL, CKMB, CKMBINDEX, TROPONINI in the last 168 hours. BNP: Invalid input(s): POCBNP CBG: Recent Labs  Lab 12/16/19 0731 12/16/19 1155 12/16/19 1626 12/16/19 2150 12/17/19 0721  GLUCAP 149* 136* 133* 152* 132*   D-Dimer No results for input(s): DDIMER in the last 72 hours. Hgb A1c No results for input(s): HGBA1C in the last 72 hours. Lipid Profile No results for input(s): CHOL, HDL, LDLCALC, TRIG, CHOLHDL, LDLDIRECT in the last 72 hours. Thyroid function studies No results for input(s): TSH, T4TOTAL, T3FREE, THYROIDAB in the last 72 hours.  Invalid input(s): FREET3 Anemia work up No results for input(s): VITAMINB12, FOLATE, FERRITIN, TIBC, IRON, RETICCTPCT in the last 72 hours. Urinalysis    Component Value Date/Time   COLORURINE YELLOW 12/14/2019 1053   APPEARANCEUR CLEAR 12/14/2019 1053   LABSPEC 1.027 12/14/2019 1053   PHURINE 5.0 12/14/2019 1053   GLUCOSEU >=500 (A) 12/14/2019 1053   HGBUR MODERATE (A) 12/14/2019 1053   BILIRUBINUR NEGATIVE 12/14/2019 1053   KETONESUR 5 (A) 12/14/2019 1053   PROTEINUR NEGATIVE 12/14/2019 1053   NITRITE NEGATIVE 12/14/2019 1053   LEUKOCYTESUR MODERATE (A) 12/14/2019 1053   Sepsis Labs Invalid input(s): PROCALCITONIN,  WBC,  LACTICIDVEN Microbiology Recent Results (from the past 240 hour(s))  Culture, Urine     Status: Abnormal   Collection Time: 12/14/19 10:53 AM   Specimen: Urine, Clean Catch  Result Value Ref Range Status   Specimen Description   Final    URINE, CLEAN CATCH Performed at [facility] [place] [address], [place], [place] [address]    Special Requests   Final    NONE Performed at [facility], [place] [address] [place], [place] [address]    Culture >=100,000 COLONIES/mL ENTEROCOCCUS FAECALIS (A)  Final   Report Status 12/16/2019 FINAL  Final   Organism ID, Bacteria ENTEROCOCCUS FAECALIS (A)  Final      Susceptibility   Enterococcus faecalis - MIC*    AMPICILLIN 4 SENSITIVE Sensitive     NITROFURANTOIN <=16 SENSITIVE Sensitive     VANCOMYCIN 1 SENSITIVE Sensitive     * >=100,000 COLONIES/mL ENTEROCOCCUS FAECALIS  SARS Coronavirus 2 by RT PCR (hospital order, performed in [place] hospital lab) Nasopharyngeal Nasopharyngeal Swab     Status: None   Collection Time: 12/14/19  1:07 PM   Specimen: Nasopharyngeal Swab  Result Value Ref Range Status   SARS Coronavirus 2 NEGATIVE NEGATIVE Final    Comment: (NOTE) SARS-CoV-2 target nucleic acids are NOT DETECTED.  The SARS-CoV-2 RNA is generally detectable in upper and lower respiratory specimens during the acute phase of infection. The lowest concentration of SARS-CoV-2 viral copies this assay can detect is 250 copies / mL. A negative result does not preclude SARS-CoV-2 infection and should not be used as the sole basis for treatment or other patient management decisions.  A negative result may occur with improper specimen collection / handling, submission of specimen other than nasopharyngeal swab, presence of viral mutation(s) within the areas targeted by this assay, and inadequate number of viral copies (<250 copies / mL). A negative result must be combined with clinical observations, patient history, and epidemiological information.  Fact Sheet for Patients:   StrictlyIdeas.no  Fact Sheet for Healthcare Providers: BankingDealers.co.za  This test is not yet approved or  cleared by the [place] FDA and has been authorized for detection and/or diagnosis of SARS-CoV-2 by FDA under an Emergency Use Authorization (EUA).  This EUA will remain in effect  (meaning this test can  be used) for the duration of the COVID-19 declaration under Section 564(b)(1) of the Act, 21 U.S.C. section 360bbb-3(b)(1), unless the authorization is terminated or revoked sooner.  Performed at [facility], [place] [address], [place], [place] [address]     Procedures/Studies: DG Abdomen 1 View  Result Date: 12/15/2019 CLINICAL DATA:  Reinsertion of NG tube EXAM: ABDOMEN - 1 VIEW COMPARISON:  12/14/2019 FINDINGS: Enteric tube is present with tip coiled in the left upper quadrant consistent with location in the body of the stomach. Mild gaseous distention of the stomach is suggested. IMPRESSION: Enteric tube tip is coiled in the left upper quadrant consistent with location in the body of the stomach. Electronically Signed   By: Lucienne Capers M.D.   On: 12/15/2019 03:33   DG Abdomen 1 View  Result Date: 12/14/2019 CLINICAL DATA:  NG tube placement EXAM: ABDOMEN - 1 VIEW COMPARISON:  12/14/2019 FINDINGS: The NG tube tip projects over the gastric body. The tip is pointed distally. The visualized bowel gas pattern is unremarkable. IMPRESSION: NG tube projects over the gastric body. Electronically Signed   By: Constance Holster M.D.   On: 12/14/2019 23:07   DG Abdomen 1 View  Result Date: 12/14/2019 CLINICAL DATA:  Enteric tube placement. EXAM: ABDOMEN - 1 VIEW COMPARISON:  Abdominal x-ray from same day. FINDINGS: Enteric tube tip in the stomach with the distal side port in the lower esophagus. The bowel gas pattern is normal. No radio-opaque calculi or other significant radiographic abnormality are seen. IMPRESSION: 1. Enteric tube in the stomach with the distal side port in the lower esophagus. Recommend advancing 8 cm. Electronically Signed   By: Titus Dubin M.D.   On: 12/14/2019 21:08   CT ABDOMEN PELVIS W CONTRAST  Result Date: 12/14/2019 CLINICAL DATA:  Acute nonlocalized abdominal pain EXAM: CT ABDOMEN AND PELVIS WITH CONTRAST TECHNIQUE:  Multidetector CT imaging of the abdomen and pelvis was performed using the standard protocol following bolus administration of intravenous contrast. CONTRAST:  168mL OMNIPAQUE IOHEXOL 300 MG/ML  SOLN COMPARISON:  April 10, 2016 FINDINGS: Lower chest: Signs of calcified coronary artery disease and aortic valvular calcification similar to the prior study. No effusion. No consolidation. Hepatobiliary: Hepatic steatosis. Post cholecystectomy. No focal, suspicious hepatic lesion. Portal vein is patent. Pancreas: Pancreas is normal without ductal dilation or inflammation. Spleen: Spleen normal in size and contour. Adrenals/Urinary Tract: Adrenal glands are normal. Symmetric renal enhancement.  No hydronephrosis. Stomach/Bowel: Signs of previous small-bowel resection with evidence of proximal small bowel distension, degree of distension similar to 05-25-2016. Mild perienteric stranding an subtle mesenteric fluid less pronounced than in 05/25/16 in terms of the stranding that is present. There is a transition point at a similar location in the RIGHT mid abdomen (image 55, series 3) decompressed small bowel loops beyond this level. Abundant stool does however remain mainly within the distal colon. Signs of colonic diverticulosis without diverticular inflammation. Vascular/Lymphatic: Aortic atherosclerosis both calcified and noncalcified plaque. No aneurysm. No adenopathy. Vessels grossly patent on venous phase assessment. Portal vein is patent. Reproductive: Nonspecific appearance of prostate is unremarkable. Other: Moderate-size fat containing RIGHT inguinal hernia and small fat containing LEFT inguinal hernia. Musculoskeletal: Spinal degenerative changes. No acute or destructive bone process. IMPRESSION: 1. Early complete versus partial small bowel obstruction with similar appearance to study of April 10, 2016 showing signs of enteritis and evidence of prior small-bowel resection. Transition point at a  similar location in the RIGHT hemiabdomen. 2. Hepatic  steatosis. 3. Diverticulosis 4. Moderate-size fat containing RIGHT inguinal hernia and small fat containing LEFT inguinal hernia. 5. Signs of calcified coronary artery disease and aortic valvular calcification similar to the prior study. 6. Aortic atherosclerosis. Aortic Atherosclerosis (ICD10-I70.0). Electronically Signed   By: Zetta Bills M.D.   On: 12/14/2019 12:14   DG Abd Portable 1V  Result Date: 12/16/2019 CLINICAL DATA:  Small bowel obstruction. EXAM: PORTABLE ABDOMEN - 1 VIEW COMPARISON:  December 15, 2019. FINDINGS: The bowel gas pattern is normal. Nasogastric tube tip is seen in proximal stomach. Status post cholecystectomy. Residual contrast is noted in the colon. No radio-opaque calculi or other significant radiographic abnormality are seen. IMPRESSION: No evidence of bowel obstruction or ileus. Nasogastric tube tip seen in proximal stomach. Electronically Signed   By: Marijo Conception M.D.   On: 12/16/2019 08:08   DG Abd Portable 1V-Small Bowel Obstruction Protocol-initial, 8 hr delay  Result Date: 12/15/2019 CLINICAL DATA:  Small-bowel obstruction EXAM: PORTABLE ABDOMEN - 1 VIEW COMPARISON:  12/15/2019, 12/14/2019 FINDINGS: Nasogastric tube courses below the diaphragm with distal tip and side port coiled within the gastric body/fundus. Mildly dilated loops of small bowel within the abdomen measuring up to 3.9 cm in diameter, similar to prior. No gross free intraperitoneal air. IMPRESSION: 1. Similar degree of small bowel dilation compared to prior. 2. Nasogastric tube courses below the diaphragm with distal tip and side port coiled within the gastric body/fundus. Electronically Signed   By: Davina Poke D.O.   On: 12/15/2019 08:02   DG Abd Portable 1V-Small Bowel Protocol-Position Verification  Result Date: 12/14/2019 CLINICAL DATA:  Nasogastric tube placement. EXAM: PORTABLE ABDOMEN - 1 VIEW COMPARISON:  June 17, 2019. FINDINGS:  The bowel gas pattern is normal. Distal tip of nasogastric tube is seen in proximal stomach. No radio-opaque calculi or other significant radiographic abnormality are seen. IMPRESSION: Distal tip of nasogastric tube seen in proximal stomach. Electronically Signed   By: Marijo Conception M.D.   On: 12/14/2019 16:36   ECHOCARDIOGRAM COMPLETE  Result Date: 12/16/2019    ECHOCARDIOGRAM REPORT   Patient Name:   ARELI JOWETT Date of Exam: 12/16/2019 Medical Rec #:  [id number]        Height:       71.0 in Accession #:    [id number]       Weight:       269.0 lb Date of Birth:  10-09-1935       BSA:          2.391 m Patient Age:    52 years         BP:           138/105 mmHg Patient Gender: M                HR:           110 bpm. Exam Location:  Inpatient Procedure: 2D Echo Indications:    Cardiomyopathy I42.9  History:        Patient has no prior history of Echocardiogram examinations.                 Previous Myocardial Infarction.  Sonographer:    Mikki Santee RDCS (AE) Referring Phys: [id number] [place] IMPRESSIONS  1. LV visually appears 45-50%. Reduced sensitivity for focal wall motion abnormalities on this study, but appears mildly globally hypokinetic without clear focal features. Left ventricular ejection fraction, by estimation, is 45 to 50%. The left ventricle has mildly decreased function. The  left ventricle demonstrates global hypokinesis. There is mild concentric left ventricular hypertrophy. Left ventricular diastolic function could not be evaluated.  2. Right ventricular systolic function is mildly reduced. The right ventricular size is normal. Tricuspid regurgitation signal is inadequate for assessing PA pressure.  3. The mitral valve is normal in structure. Trivial mitral valve regurgitation. No evidence of mitral stenosis. Moderate mitral annular calcification.  4. The aortic valve is tricuspid. There is moderate calcification of the aortic valve. There is mild thickening of the aortic valve.  Aortic valve regurgitation is mild. Mild to moderate aortic valve sclerosis/calcification is present, without any evidence of aortic stenosis.  5. The inferior vena cava is dilated in size with >50% respiratory variability, suggesting right atrial pressure of 8 mmHg. Comparison(s): No prior Echocardiogram. FINDINGS  Left Ventricle: LV visually appears 45-50%. Reduced sensitivity for focal wall motion abnormalities on this study, but appears mildly globally hypokinetic without clear focal features. Left ventricular ejection fraction, by estimation, is 45 to 50%. The  left ventricle has mildly decreased function. The left ventricle demonstrates global hypokinesis. Definity contrast agent was given IV to delineate the left ventricular endocardial borders. The left ventricular internal cavity size was normal in size. There is mild concentric left ventricular hypertrophy. Left ventricular diastolic function could not be evaluated due to atrial fibrillation. Left ventricular diastolic function could not be evaluated. Right Ventricle: The right ventricular size is normal. No increase in right ventricular wall thickness. Right ventricular systolic function is mildly reduced. Tricuspid regurgitation signal is inadequate for assessing PA pressure. Left Atrium: Left atrial size was normal in size. Right Atrium: Right atrial size was normal in size. Pericardium: Trivial pericardial effusion is present. Presence of pericardial fat pad. Mitral Valve: The mitral valve is normal in structure. There is mild thickening of the mitral valve leaflet(s). There is mild calcification of the mitral valve leaflet(s). Moderate mitral annular calcification. Trivial mitral valve regurgitation. No evidence of mitral valve stenosis. Tricuspid Valve: The tricuspid valve is normal in structure. Tricuspid valve regurgitation is trivial. No evidence of tricuspid stenosis. Aortic Valve: The aortic valve is tricuspid. There is moderate calcification of  the aortic valve. There is mild thickening of the aortic valve. Aortic valve regurgitation is mild. Mild to moderate aortic valve sclerosis/calcification is present, without any evidence of aortic stenosis. Pulmonic Valve: The pulmonic valve was grossly normal. Pulmonic valve regurgitation is trivial. No evidence of pulmonic stenosis. Aorta: The aortic root and ascending aorta are structurally normal, with no evidence of dilitation and the aortic arch was not well visualized. Venous: The inferior vena cava is dilated in size with greater than 50% respiratory variability, suggesting right atrial pressure of 8 mmHg. IAS/Shunts: The atrial septum is grossly normal.  LEFT VENTRICLE PLAX 2D LVIDd:         5.10 cm LVIDs:         4.02 cm LV PW:         1.30 cm LV IVS:        1.30 cm LVOT diam:     2.60 cm LV SV:         91 LV SV Index:   38 LVOT Area:     5.31 cm  RIGHT VENTRICLE RV S prime:     7.38 cm/s TAPSE (M-mode): 1.2 cm LEFT ATRIUM           Index       RIGHT ATRIUM           Index LA  diam:      3.70 cm 1.55 cm/m  RA Area:     23.10 cm LA Vol (A2C): 79.6 ml 33.28 ml/m RA Volume:   62.40 ml  26.09 ml/m LA Vol (A4C): 50.3 ml 21.03 ml/m  AORTIC VALVE LVOT Vmax:   84.20 cm/s LVOT Vmean:  52.600 cm/s LVOT VTI:    0.171 m  AORTA Ao Root diam: 3.30 cm  SHUNTS Systemic VTI:  0.17 m Systemic Diam: 2.60 cm Buford Dresser MD Electronically signed by Buford Dresser MD Signature Date/Time: 12/16/2019/12:36:19 PM    Final      Time coordinating discharge: Over 30 minutes  SIGNED:   Guilford Shi, MD  [facility] 12/17/2019, 11:22 AM

## 2019-12-17 NOTE — Progress Notes (Signed)
Pt discharged home today per Dr. Earnest Conroy. Pt's IV sites D/C'd and WDL. Pt's VSS. Pt provided with home medication list, discharge instructions and prescriptions. Verbalized understanding. Pt left floor via WC in stable condition accompanied by RN.

## 2019-12-17 NOTE — Progress Notes (Signed)
ANTICOAGULATION CONSULT NOTE - Follow Up Consult  Pharmacy Consult for Heparin Indication: atrial fibrillation  Allergies  Allergen Reactions  . Atorvastatin Other (See Comments)    Dizziness.  . Sulfa Antibiotics   . Ace Inhibitors Rash  . Amoxicillin Rash  . Meperidine Rash  . Naproxen Sodium Rash  . Sulfamethoxazole Rash  . Tetracycline Rash   Patient Measurements: Height: 5\' 11"  (180.3 cm) Weight: 122 kg (268 lb 15.4 oz) IBW/kg (Calculated) : 75.3 Heparin Dosing Weight: 101.6 kg  Vital Signs: Temp: 98.3 F (36.8 C) (09/10 0502) Temp Source: Oral (09/10 0502) BP: 123/63 (09/10 0502) Pulse Rate: 68 (09/10 0502)  Labs: Recent Labs     0000 12/14/19 0800 12/14/19 1102 12/15/19 0126 12/15/19 0403 12/15/19 1100 12/16/19 0950 12/16/19 1824 12/17/19 0456  HGB   < > 18.4*  --   --  17.8*  --  16.5  --  15.0  HCT   < > 57.0*  --   --  54.6*  --  52.4*  --  46.5  PLT   < > 254  --   --  253  --  196  --  155  APTT  --   --  52*  --   --   --   --   --   --   LABPROT  --   --  16.1*  --   --   --   --   --   --   INR  --   --  1.3*  --   --   --   --   --   --   HEPARINUNFRC  --   --   --    < >  --    < > 0.47 0.32 0.29*  CREATININE  --  1.56*  --   --  1.74*  --  1.48*  --   --    < > = values in this interval not displayed.   Estimated Creatinine Clearance: 52 mL/min (A) (by C-G formula based on SCr of 1.48 mg/dL (H)).  Medications:  Scheduled:  . insulin aspart  0-15 Units Subcutaneous TID WC  . insulin glargine  10 Units Subcutaneous Daily  . metoprolol tartrate  5 mg Intravenous Q12H  . pantoprazole (PROTONIX) IV  40 mg Intravenous Q12H  . sodium chloride flush  3 mL Intravenous Q12H   Infusions:  . heparin 1,700 Units/hr (12/16/19 2219)  . lactated ringers 75 mL/hr at 12/16/19 2207   Assessment: 83 y/oM on Pradaxa PTA for a-fib admitted for recurrent SBO vs. partial obstruction. Pharmacy consulted to start IV heparin infusion while patient NPO.    Today, 12/17/2019:  Heparin level 0.29, slightly subtherapeutic on heparin 1700 units/hr  CBC: Hgb WNL, Plt wnl  No bleeding or complications reported per RN   Scr improving down to 1.48    Goal of Therapy:  Heparin level 0.3-0.7 units/ml Monitor platelets by anticoagulation protocol: Yes   Plan:   Increase Heparin infusion to 1800 units/hr  Obtain HL 8 hours after rate change   Daily heparin level and CBC  Monitor for signs and symptoms of bleeding   Royetta Asal, PharmD, BCPS 12/17/2019 5:33 AM

## 2019-12-17 NOTE — Progress Notes (Signed)
Subjective: Patient feels great today.  Had a lot of SBOs in past and feels he is completely resolved.  He is having BMs.  He is passing flatus and no nausea with NGT clamped and tolerating CLD.  ROS: See above, otherwise other systems negative  Objective: Vital signs in last 24 hours: Temp:  [97.8 F (36.6 C)-98.3 F (36.8 C)] 98.3 F (36.8 C) (09/10 0502) Pulse Rate:  [68-80] 68 (09/10 0502) Resp:  [19] 19 (09/10 0502) BP: (123-177)/(63-80) 123/63 (09/10 0502) SpO2:  [97 %-100 %] 98 % (09/10 0502) Last BM Date: 12/16/19  Intake/Output from previous day: 09/09 0701 - 09/10 0700 In: 3134.1 [P.O.:1200; I.V.:1934.1] Out: 1906 [Urine:855; Emesis/NG output:1050; Stool:1] Intake/Output this shift: No intake/output data recorded.  PE: Abd: obese, seems a bit distended to me but he states this is his baseline, +BS, NT  Lab Results:  Recent Labs    12/16/19 0950 12/17/19 0456  WBC 8.6 9.9  HGB 16.5 15.0  HCT 52.4* 46.5  PLT 196 155   BMET Recent Labs    12/16/19 0950 12/17/19 0456  NA 140 138  K 3.9 3.7  CL 98 96*  CO2 28 30  GLUCOSE 141* 140*  BUN 33* 23  CREATININE 1.48* 1.26*  CALCIUM 8.6* 8.4*   PT/INR Recent Labs    12/14/19 1102  LABPROT 16.1*  INR 1.3*   CMP     Component Value Date/Time   NA 138 12/17/2019 0456   NA 141 06/03/2019 1543   K 3.7 12/17/2019 0456   CL 96 (L) 12/17/2019 0456   CO2 30 12/17/2019 0456   GLUCOSE 140 (H) 12/17/2019 0456   BUN 23 12/17/2019 0456   BUN 25 06/03/2019 1543   CREATININE 1.26 (H) 12/17/2019 0456   CALCIUM 8.4 (L) 12/17/2019 0456   PROT 8.0 12/14/2019 0800   ALBUMIN 4.5 12/14/2019 0800   AST 24 12/14/2019 0800   ALT 21 12/14/2019 0800   ALKPHOS 57 12/14/2019 0800   BILITOT 1.6 (H) 12/14/2019 0800   GFRNONAA 53 (L) 12/17/2019 0456   GFRAA >60 12/17/2019 0456   Lipase     Component Value Date/Time   LIPASE 27 12/14/2019 0800       Studies/Results: DG Abd Portable 1V  Result Date:  12/16/2019 CLINICAL DATA:  Small bowel obstruction. EXAM: PORTABLE ABDOMEN - 1 VIEW COMPARISON:  December 15, 2019. FINDINGS: The bowel gas pattern is normal. Nasogastric tube tip is seen in proximal stomach. Status post cholecystectomy. Residual contrast is noted in the colon. No radio-opaque calculi or other significant radiographic abnormality are seen. IMPRESSION: No evidence of bowel obstruction or ileus. Nasogastric tube tip seen in proximal stomach. Electronically Signed   By: Marijo Conception M.D.   On: 12/16/2019 08:08   ECHOCARDIOGRAM COMPLETE  Result Date: 12/16/2019    ECHOCARDIOGRAM REPORT   Patient Name:   Shawn Blanchard Date of Exam: 12/16/2019 Medical Rec #:  [id number]        Height:       71.0 in Accession #:    [id number]       Weight:       269.0 lb Date of Birth:  Sep 04, 1935       BSA:          2.391 m Patient Age:    84 years         BP:           138/105 mmHg Patient Gender: M  HR:           110 bpm. Exam Location:  Inpatient Procedure: 2D Echo Indications:    Cardiomyopathy I42.9  History:        Patient has no prior history of Echocardiogram examinations.                 Previous Myocardial Infarction.  Sonographer:    Mikki Santee RDCS (AE) Referring Phys: [id number] Guilford Shi IMPRESSIONS  1. LV visually appears 45-50%. Reduced sensitivity for focal wall motion abnormalities on this study, but appears mildly globally hypokinetic without clear focal features. Left ventricular ejection fraction, by estimation, is 45 to 50%. The left ventricle has mildly decreased function. The left ventricle demonstrates global hypokinesis. There is mild concentric left ventricular hypertrophy. Left ventricular diastolic function could not be evaluated.  2. Right ventricular systolic function is mildly reduced. The right ventricular size is normal. Tricuspid regurgitation signal is inadequate for assessing PA pressure.  3. The mitral valve is normal in structure. Trivial mitral valve  regurgitation. No evidence of mitral stenosis. Moderate mitral annular calcification.  4. The aortic valve is tricuspid. There is moderate calcification of the aortic valve. There is mild thickening of the aortic valve. Aortic valve regurgitation is mild. Mild to moderate aortic valve sclerosis/calcification is present, without any evidence of aortic stenosis.  5. The inferior vena cava is dilated in size with >50% respiratory variability, suggesting right atrial pressure of 8 mmHg. Comparison(s): No prior Echocardiogram. FINDINGS  Left Ventricle: LV visually appears 45-50%. Reduced sensitivity for focal wall motion abnormalities on this study, but appears mildly globally hypokinetic without clear focal features. Left ventricular ejection fraction, by estimation, is 45 to 50%. The  left ventricle has mildly decreased function. The left ventricle demonstrates global hypokinesis. Definity contrast agent was given IV to delineate the left ventricular endocardial borders. The left ventricular internal cavity size was normal in size. There is mild concentric left ventricular hypertrophy. Left ventricular diastolic function could not be evaluated due to atrial fibrillation. Left ventricular diastolic function could not be evaluated. Right Ventricle: The right ventricular size is normal. No increase in right ventricular wall thickness. Right ventricular systolic function is mildly reduced. Tricuspid regurgitation signal is inadequate for assessing PA pressure. Left Atrium: Left atrial size was normal in size. Right Atrium: Right atrial size was normal in size. Pericardium: Trivial pericardial effusion is present. Presence of pericardial fat pad. Mitral Valve: The mitral valve is normal in structure. There is mild thickening of the mitral valve leaflet(s). There is mild calcification of the mitral valve leaflet(s). Moderate mitral annular calcification. Trivial mitral valve regurgitation. No evidence of mitral valve  stenosis. Tricuspid Valve: The tricuspid valve is normal in structure. Tricuspid valve regurgitation is trivial. No evidence of tricuspid stenosis. Aortic Valve: The aortic valve is tricuspid. There is moderate calcification of the aortic valve. There is mild thickening of the aortic valve. Aortic valve regurgitation is mild. Mild to moderate aortic valve sclerosis/calcification is present, without any evidence of aortic stenosis. Pulmonic Valve: The pulmonic valve was grossly normal. Pulmonic valve regurgitation is trivial. No evidence of pulmonic stenosis. Aorta: The aortic root and ascending aorta are structurally normal, with no evidence of dilitation and the aortic arch was not well visualized. Venous: The inferior vena cava is dilated in size with greater than 50% respiratory variability, suggesting right atrial pressure of 8 mmHg. IAS/Shunts: The atrial septum is grossly normal.  LEFT VENTRICLE PLAX 2D LVIDd:  5.10 cm LVIDs:         4.02 cm LV PW:         1.30 cm LV IVS:        1.30 cm LVOT diam:     2.60 cm LV SV:         91 LV SV Index:   38 LVOT Area:     5.31 cm  RIGHT VENTRICLE RV S prime:     7.38 cm/s TAPSE (M-mode): 1.2 cm LEFT ATRIUM           Index       RIGHT ATRIUM           Index LA diam:      3.70 cm 1.55 cm/m  RA Area:     23.10 cm LA Vol (A2C): 79.6 ml 33.28 ml/m RA Volume:   62.40 ml  26.09 ml/m LA Vol (A4C): 50.3 ml 21.03 ml/m  AORTIC VALVE LVOT Vmax:   84.20 cm/s LVOT Vmean:  52.600 cm/s LVOT VTI:    0.171 m  AORTA Ao Root diam: 3.30 cm  SHUNTS Systemic VTI:  0.17 m Systemic Diam: 2.60 cm Buford Dresser MD Electronically signed by Buford Dresser MD Signature Date/Time: 12/16/2019/12:36:19 PM    Final     Anti-infectives: Anti-infectives (From admission, onward)   None       Assessment/Plan Small bowel Hx lymphoma  HxMI (myocardial infarction)/CAD Hx atrial fibrillation on Pradaxa- currently uncertain of last dose timing Hx type 2 diabetes Stage III  chronic kidney disease COPD/sleep apnea Hx hypertension with heart failure - Dr. Shirlee More Hx orthostatic hypotension Morbid obesity Dyslipidemia Possible UTI Dehydration Bilateral fat containing inguinal hernias/CT   Recurrent small bowel obstruction versus partial obstruction Enteritis vs recurrent lymphoma S/Pexploratory laparotomy small bowel resection secondary to his lymphoma, open lysis of adhesions S/p open cholecystectomy  -patient resolving.  DC NGT and adv to soft diet.  If he tolerates this he is surgically stable for DC home.  He is eager to go home  -d/w primary service  FEN: soft diet ID: none DVT: heparin gtt, stopping and will transition back to pradaxa Follow up: PCP prn   LOS: 3 days    Henreitta Cea , [facility] 12/17/2019, 8:36 AM Please see Amion for pager number during day hours 7:00am-4:30pm or 7:00am -11:30am on weekends

## 2019-12-17 NOTE — Plan of Care (Signed)

## 2019-12-24 ENCOUNTER — Ambulatory Visit: Payer: Medicare PPO | Admitting: Cardiology

## 2019-12-31 DIAGNOSIS — I252 Old myocardial infarction: Secondary | ICD-10-CM | POA: Insufficient documentation

## 2020-01-02 NOTE — Progress Notes (Signed)
Cardiology Office Note:    Date:  01/03/2020   ID:  Shawn Blanchard, DOB 09-03-1935, MRN [id number]  PCP:  Algis Greenhouse, MD  Cardiologist:  Shirlee More, MD    Referring MD: Algis Greenhouse, MD   seeing you tomorrow please do a lipid profile.   ASSESSMENT:    1. Coronary artery disease involving native coronary artery of native heart with angina pectoris ([place])   2. Chronic atrial fibrillation (HCC)   3. Chronic anticoagulation   4. Hypertensive heart disease with heart failure ([place])   5. Dyslipidemia   6. Orthostatic hypotension    PLAN:    In order of problems listed above:  1. Stable CAD after remote CABG continue medical therapy aspirin beta-blocker.  At this time I would not advise an ischemia evaluation 2. Rate controlled continue his beta-blocker anticoagulant 3. Stable continue current treatment BP at target mean blood pressure of 70 mmHg 4. Statin intolerant continue PCSK9 inhibitor seeing his PCP tomorrow and I will remind him to check lipids 5. Improved    Next appointment: 6 months   Medication Adjustments/Labs and Tests Ordered: Current medicines are reviewed at length with the patient today.  Concerns regarding medicines are outlined above.  No orders of the defined types were placed in this encounter.  No orders of the defined types were placed in this encounter.   Chief Complaint  Patient presents with  . Follow-up  . Coronary Artery Disease  . Congestive Heart Failure  . Atrial Fibrillation  . Anticoagulation  . Hypotension    History of Present Illness:    Shawn Blanchard is a 84 y.o. male with a hx of CAD heart failure EF 47% chronic atrial fibrillation hypertensive heart disease orthostatic hypotension with SGLT2 inhibitor CAD and hyper lipidemia last seen 06/18/2019.  Compliance with diet, lifestyle and medications: Yes  He was admitted to the hospital 0 12/14/2019 discharge 12/17/2019 with recurrent small bowel obstruction  responded to NG tube and decompression.  I independently reviewed his EKG 12/15/2019 showing rate controlled atrial fibrillation nonspecific conduction delay and ST-T abnormality.  CT of the abdomen 12/14/2019 showed partial small bowel obstruction right and left inguinal hernias hepatic steatosis.  CBC showed a normal white count hemoglobin 15.0 and BMP showed a creatinine 1.48 potassium 3.9 GFR 44 cc/min.  He has recovered no GI symptoms no chest pain edema shortness of breath palpitation or syncope.  He is no longer lightheaded.  He is overdue for lipid profile is seeing his PCP tomorrow and I will send a note asking them to draw lipids I suspect he will be as good as it was a year ago.  He tolerates his statin without muscle pain or weakness. Past Medical History:  Diagnosis Date  . Acute on chronic systolic congestive heart failure ([place]) 11/07/2017  . 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 Arthritis 03/12/2016  . Bilateral hand pain 03/25/2019  . Blepharitis of both upper and lower eyelid 10/02/2018  . Carpal tunnel syndrome of right wrist 03/25/2019  . Chronic anticoagulation 10/11/2016  . Chronic atrial fibrillation ([place]) 06/12/2015   Overview:  Managed CARDS  . Chronic diastolic heart failure ([place]) 09/01/2015  . Coronary artery disease involving native coronary artery of native heart with angina pectoris ([place]) 02/15/2015   Overview:  Hx of MI with stent to LAD 2002 MPS 2016 with EF 47% and no ischemia  . Dermatochalasis of both upper eyelids 11/21/2016  . Dermatochalasis of right upper eyelid  10/02/2018  . Diabetic polyneuropathy associated with type 2 diabetes mellitus ([place]) 12/16/2018   2020  . Dyslipidemia 11/07/2017  . Essential hypertension 11/07/2017  . Ganglion cyst 11/27/2015   Overview:  2017: dorsum right hand  . Gastroesophageal reflux disease without esophagitis 06/12/2015  . Hyperlipidemia 04/11/2016  . Hypertensive heart disease with heart failure ([place]) 02/15/2015  . Idiopathic  chronic gout 05/17/2015  . Insomnia 11/27/2015  . Leukocytosis 04/11/2016  . Lightheadedness 10/11/2016  . Long term current use of oral hypoglycemic drug 10/02/2018  . 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[place]) 08/30/2015  . Male erectile disorder 06/17/2016  . Meibomian gland dysfunction (MGD) of both eyes 10/02/2018  . MI (myocardial infarction) ([place]) 02/15/2015   Overview:  2002  . Obesity 04/11/2016  . 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 exacerbation ([place]) 06/12/2015  . Obstructive sleep apnea 08/30/2015   Overview:  CPAP  . Old MI (myocardial infarction)   . Orthostatic dizziness 09/15/2017   2018: med related  . Permanent atrial fibrillation ([place]) 06/12/2015   Overview:  Managed CARDS  . Pseudophakia of both eyes 03/12/2016  . Pulmonary hypertension ([place]) 08/30/2015   Overview:  Managed CARDS  . Purulent inflammation of skin 12/29/2018  . Restless leg syndrome 08/30/2015  . Restrictive lung disease 12/26/2016   Overview:  2018: mod on spirometry  . SBO (small bowel obstruction) ([place]) 12/14/2019  . Senile nuclear sclerosis 05/02/2016  . Small bowel obstruction ([place]) 04/11/2016  . SOB (shortness of breath) 08/14/2015  . Type 2 diabetes mellitus with neurologic complication, without long-term current use of insulin ([place]) 12/02/2014  . Unilateral inguinal hernia without obstruction or gangrene 06/12/2015    Past Surgical History:  Procedure Laterality Date  . CATARACT EXTRACTION Left   . CERVICAL SPINE SURGERY    . CHOLECYSTECTOMY    . NASAL SINUS SURGERY    . PARTIAL KNEE ARTHROPLASTY Bilateral   . SMALL INTESTINE SURGERY    . TONSILLECTOMY      Current Medications: Current Meds  Medication Sig  . acetaminophen (TYLENOL) 325 MG tablet Take 325 mg by mouth every 6 (six) hours as needed for mild pain or headache.  . allopurinol (ZYLOPRIM) 100 MG tablet Take 100 mg by mouth 2 (two) times daily as needed (gout).   Marland Kitchen aspirin EC 81 MG tablet Take 81 mg by mouth daily.  . carvedilol (COREG) 3.125 MG tablet  Take 1 tablet (3.125 mg total) by mouth 2 (two) times daily with a meal.  . furosemide (LASIX) 20 MG tablet Take 1 tablet (20 mg total) by mouth daily.  Marland Kitchen glucose blood test strip OneTouch Ultra [employer]  . JARDIANCE 25 MG TABS tablet Take 25 mg by mouth daily.   Marland Kitchen KOMBIGLYZE XR 08-998 MG TB24 Take 1 tablet by mouth every morning.  . Lancets (ONETOUCH DELICA PLUS [id number]) MISC OneTouch Delica Plus Lancet 33 gauge  . melatonin 3 MG TABS tablet Take 3 mg by mouth at bedtime as needed (sleep).  . nitroGLYCERIN (NITROSTAT) 0.4 MG SL tablet Place 1 tablet (0.4 mg total) under the tongue every 5 (five) minutes as needed for chest pain.  . polyvinyl alcohol (LIQUIFILM TEARS) 1.4 % ophthalmic solution Place 1 drop into both eyes as needed for dry eyes.  Marland Kitchen PRADAXA 150 MG CAPS capsule TAKE 1 CAPSULE BY MOUTH 2 TIMES DAILY (Patient taking differently: Take 150 mg by mouth 2 (two) times daily. )  . pramipexole (MIRAPEX) 0.25 MG tablet Take 0.25 mg by mouth 2 (two) times  daily.  . Probiotic Product (PROBIOTIC PO) Take 1 capsule by mouth daily.  Marland Kitchen REPATHA SURECLICK 462 MG/ML SOAJ Inject 140 mg into the skin 2 (two) times a week.      Allergies:   Atorvastatin, Sulfa antibiotics, Ace inhibitors, Amoxicillin, Meperidine, Naproxen sodium, Sulfamethoxazole, and Tetracycline   Social History   Socioeconomic History  . Marital status: Married    Spouse name: Not on file  . Number of children: Not on file  . Years of education: Not on file  . Highest education level: Not on file  Occupational History  . Not on file  Tobacco Use  . Smoking status: Never Smoker  . Smokeless tobacco: Never Used  Vaping Use  . Vaping Use: Never used  Substance and Sexual Activity  . Alcohol use: Not Currently  . Drug use: Never  . Sexual activity: Not on file  Other Topics Concern  . Not on file  Social History Narrative  . Not on file   Social Determinants of Health   Financial Resource Strain:   .  Difficulty of Paying Living Expenses: Not on file  Food Insecurity:   . Worried About Charity fundraiser in the Last Year: Not on file  . Ran Out of Food in the Last Year: Not on file  Transportation Needs:   . Lack of Transportation (Medical): Not on file  . Lack of Transportation (Non-Medical): Not on file  Physical Activity:   . Days of Exercise per Week: Not on file  . Minutes of Exercise per Session: Not on file  Stress:   . Feeling of Stress : Not on file  Social Connections:   . Frequency of Communication with Friends and Family: Not on file  . Frequency of Social Gatherings with Friends and Family: Not on file  . Attends Religious Services: Not on file  . Active Member of Clubs or Organizations: Not on file  . Attends Archivist Meetings: Not on file  . Marital Status: Not on file     Family History: The patient's family history includes Cancer in his mother; Diabetes in his father and mother; Heart disease in his father and mother; Stroke in his father. ROS:   Please see the history of present illness.    All other systems reviewed and are negative.  EKGs/Labs/Other Studies Reviewed:    The following studies were reviewed today:  Recent Labs: 12/14/2019: ALT 21 12/17/2019: BUN 23; Creatinine, Ser 1.26; Hemoglobin 15.0; Platelets 155; Potassium 3.7; Sodium 138  Recent Lipid Panel 12/16/2018: Cholesterol 104, LDL 55 triglyceride 204 HDL 32 non-HDL cholesterol 72  Physical Exam:    VS:  BP (!) 150/60   Pulse 72   Ht 5\' 11"  (1.803 m)   Wt 274 lb (124.3 kg)   BMI 38.22 kg/m     Wt Readings from Last 3 Encounters:  01/03/20 274 lb (124.3 kg)  12/15/19 268 lb 15.4 oz (122 kg)  06/18/19 277 lb (125.6 kg)     GEN:  Well nourished, well developed in no acute distress HEENT: Normal NECK: No JVD; No carotid bruits LYMPHATICS: No lymphadenopathy CARDIAC: RRR, no murmurs, rubs, gallops RESPIRATORY:  Clear to auscultation without rales, wheezing or rhonchi   ABDOMEN: Soft, non-tender, non-distended MUSCULOSKELETAL:  No edema; No deformity  SKIN: Warm and dry NEUROLOGIC:  Alert and oriented x 3 PSYCHIATRIC:  Normal affect    Signed, Shirlee More, MD  01/03/2020 8:27 AM    [place]

## 2020-01-03 ENCOUNTER — Other Ambulatory Visit: Payer: Self-pay

## 2020-01-03 ENCOUNTER — Encounter: Payer: Self-pay | Admitting: Cardiology

## 2020-01-03 ENCOUNTER — Ambulatory Visit: Payer: Medicare PPO | Admitting: Cardiology

## 2020-01-03 VITALS — BP 150/60 | HR 72 | Ht 71.0 in | Wt 274.0 lb

## 2020-01-03 DIAGNOSIS — I25119 Atherosclerotic heart disease of native coronary artery with unspecified angina pectoris: Secondary | ICD-10-CM

## 2020-01-03 DIAGNOSIS — I482 Chronic atrial fibrillation, unspecified: Secondary | ICD-10-CM

## 2020-01-03 DIAGNOSIS — I110 Hypertensive heart disease with heart failure: Secondary | ICD-10-CM | POA: Diagnosis not present

## 2020-01-03 DIAGNOSIS — Z7901 Long term (current) use of anticoagulants: Secondary | ICD-10-CM | POA: Diagnosis not present

## 2020-01-03 DIAGNOSIS — I951 Orthostatic hypotension: Secondary | ICD-10-CM

## 2020-01-03 DIAGNOSIS — E785 Hyperlipidemia, unspecified: Secondary | ICD-10-CM

## 2020-01-03 NOTE — Patient Instructions (Signed)

## 2020-02-02 ENCOUNTER — Other Ambulatory Visit: Payer: Self-pay | Admitting: Cardiology

## 2020-02-22 DIAGNOSIS — N1831 Chronic kidney disease, stage 3a: Secondary | ICD-10-CM | POA: Insufficient documentation

## 2020-02-22 DIAGNOSIS — E1122 Type 2 diabetes mellitus with diabetic chronic kidney disease: Secondary | ICD-10-CM

## 2020-02-22 HISTORY — DX: Chronic kidney disease, stage 3a: N18.31

## 2020-02-22 HISTORY — DX: Type 2 diabetes mellitus with diabetic chronic kidney disease: E11.22

## 2020-02-28 ENCOUNTER — Telehealth: Payer: Self-pay | Admitting: Cardiology

## 2020-02-28 NOTE — Telephone Encounter (Signed)
I would like to take this as an opportunity to streamline his antithrombotic therapy and discontinue aspirin

## 2020-02-28 NOTE — Telephone Encounter (Signed)
Pt c/o medication issue:  1. Name of Medication:  aspirin EC 81 MG tablet PRADAXA 150 MG CAPS capsule  2. How are you currently taking this medication (dosage and times per day)? pradaxa 1 capsule twice a day, aspirin 1 tablet a day  3. Are you having a reaction (difficulty breathing--STAT)? no  4. What is your medication issue? Patient states he is having bruising on his arms and hands.

## 2020-02-28 NOTE — Telephone Encounter (Signed)
Pt advised to stop his ASA and to let us know if he has any further problems. He will keep his 06/2018 appt.

## 2020-02-28 NOTE — Telephone Encounter (Signed)
Pt called to reprt that he has been having significant bruising on his arms and hands... he says they take about 2 weeks to improve.. he does not hit himself as far as he knows to cause it.   He denies any bleeding such as hematuria, no blood in his stools. No nosebleeds. No headaches.   He says he is okay with dealing with it if Dr. [name] says it is "normal" but thought it would be best to report it to [place]. He is not set for follow up until 06/2020.   Last CBS 12/2019 was WNL.   Will forward to Dr. [name] for review.   Pt is on Pradaxa 150 mg bid and ASA 81 mg daily.

## 2020-05-26 ENCOUNTER — Other Ambulatory Visit: Payer: Self-pay | Admitting: Cardiology

## 2020-06-06 ENCOUNTER — Other Ambulatory Visit: Payer: Self-pay | Admitting: Cardiology

## 2020-06-06 NOTE — Telephone Encounter (Signed)
Refill sent to pharmacy.   

## 2020-07-02 NOTE — Progress Notes (Signed)
Cardiology Office Note:    Date:  07/03/2020   ID:  Shawn Blanchard, DOB [birth year], MRN [id number]  PCP:  Algis Greenhouse, MD  Cardiologist:  Shirlee More, MD    Referring MD: Algis Greenhouse, MD    ASSESSMENT:    1. Coronary artery disease involving native coronary artery of native heart with angina pectoris ([place])   2. Hypertensive heart disease with heart failure ([place])   3. Chronic atrial fibrillation ([place])   4. Chronic anticoagulation   5. Dyslipidemia    PLAN:    In order of problems listed above:  1. Stable CAD having no angina after remote PCI continue medical therapy including beta-blocker and lipid-lowering therapy with PCSK9 inhibitor and Zetia.   2. Stable BP at target continue treatment including his diuretic and beta-blocker  3. stable rate controlled continue his anticoagulant moderate stroke risk 4. Lipids at target continue treatment PCSK9 inhibitor Zetia 5. He asked me regarding treatment for neuropathy and if I prescribe an agent like Neurontin I told him that is outside of my practice.   Next appointment: 6 months   Medication Adjustments/Labs and Tests Ordered: Current medicines are reviewed at length with the patient today.  Concerns regarding medicines are outlined above.  No orders of the defined types were placed in this encounter.  No orders of the defined types were placed in this encounter.   Complaint: Follow-up CAD atrial fibrillation  History of Present Illness:    Shawn Blanchard is a 85 y.o. male with a hx of CAD with PCI of LAD 2002 heart failure EF 47% PA CKD chronic atrial fibrillation hypertensive heart disease orthostatic hypotension with SGLT2 inhibitor CAD and hyper lipidemia  last seen 01/03/2020.  Compliance with diet, lifestyle and medications: Yes  I reviewed his labs with him his LDL is at target. He follows with his PCP and nephrology for CKD stable His biggest problem is diabetic neuropathy but [facility] 3 days a week. Home blood pressure runs 1 43-3 30 systolic. His wife is present participates in evaluation decision making and helps to supervise his medical care He has had no edema shortness of breath chest pain palpitation or syncope.  Recent labs 05/23/2020: Potassium 4.4 sodium 139 creatinine 1.30 GFR 55 cc total cholesterol 71 LDL 29 triglycerides 153 HDL 31 hemoglobin 15.7 Past Medical History:  Diagnosis Date  . Acute on chronic systolic congestive heart failure ([place]) 11/07/2017  . 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 Arthritis 03/12/2016  . Bilateral hand pain 03/25/2019  . Blepharitis of both upper and lower eyelid 10/02/2018  . Carpal tunnel syndrome of right wrist 03/25/2019  . Chronic anticoagulation 10/11/2016  . Chronic atrial fibrillation ([place]) 06/12/2015   Overview:  Managed CARDS  . Chronic diastolic heart failure ([place]) 09/01/2015  . Coronary artery disease involving native coronary artery of native heart with angina pectoris ([place]) 02/15/2015   Overview:  Hx of MI with stent to LAD 2002 MPS 2016 with EF 47% and no ischemia  . Dermatochalasis of both upper eyelids 11/21/2016  . Dermatochalasis of right upper eyelid 10/02/2018  . Diabetic polyneuropathy associated with type 2 diabetes mellitus ([place]) 12/16/2018   2020  . Dyslipidemia 11/07/2017  . Essential hypertension 11/07/2017  . Ganglion cyst 11/27/2015   Overview:  2017: dorsum right hand  . Gastroesophageal reflux disease without esophagitis 06/12/2015  . Hyperlipidemia 04/11/2016  . Hypertensive heart disease with heart failure ([place]) 02/15/2015  .  Idiopathic chronic gout 05/17/2015  . Insomnia 11/27/2015  . Leukocytosis 04/11/2016  . Lightheadedness 10/11/2016  . Long term current use of oral hypoglycemic drug 10/02/2018  . 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 Male erectile disorder 06/17/2016  . Meibomian gland dysfunction (MGD) of both eyes 10/02/2018  . MI (myocardial infarction) ([place]) 02/15/2015    Overview:  2002  . Obesity 04/11/2016  . 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 exacerbation ([place]) 06/12/2015  . Obstructive sleep apnea 08/30/2015   Overview:  CPAP  . Old MI (myocardial infarction)   . Orthostatic dizziness 09/15/2017   2018: med related  . Permanent atrial fibrillation ([place]) 06/12/2015   Overview:  Managed CARDS  . Pseudophakia of both eyes 03/12/2016  . Pulmonary hypertension ([place]) 08/30/2015   Overview:  Managed CARDS  . Purulent inflammation of skin 12/29/2018  . Restless leg syndrome 08/30/2015  . Restrictive lung disease 12/26/2016   Overview:  2018: mod on spirometry  . SBO (small bowel obstruction) ([place]) 12/14/2019  . Senile nuclear sclerosis 05/02/2016  . Small bowel obstruction ([place]) 04/11/2016  . SOB (shortness of breath) 08/14/2015  . Type 2 diabetes mellitus with neurologic complication, without long-term current use of insulin ([place]) 12/02/2014  . Unilateral inguinal hernia without obstruction or gangrene 06/12/2015    Past Surgical History:  Procedure Laterality Date  . CATARACT EXTRACTION Left   . CERVICAL SPINE SURGERY    . CHOLECYSTECTOMY    . NASAL SINUS SURGERY    . PARTIAL KNEE ARTHROPLASTY Bilateral   . SMALL INTESTINE SURGERY    . TONSILLECTOMY      Current Medications: Current Meds  Medication Sig  . acetaminophen (TYLENOL) 325 MG tablet Take 325 mg by mouth every 6 (six) hours as needed for mild pain or headache.  . allopurinol (ZYLOPRIM) 100 MG tablet Take 100 mg by mouth 2 (two) times daily as needed (gout).   . carvedilol (COREG) 3.125 MG tablet Take 1 tablet (3.125 mg total) by mouth 2 (two) times daily with a meal.  . Cyanocobalamin (VITAMIN B 12 PO) Take by mouth daily.  Marland Kitchen ezetimibe (ZETIA) 10 MG tablet Take 10 mg by mouth daily.  . furosemide (LASIX) 20 MG tablet TAKE 1 TABLET (20 MG) BY MOUTH TWICE DAILY  . gabapentin (NEURONTIN) 300 MG capsule Take 1 capsule by mouth daily.  Marland Kitchen glucose blood test strip OneTouch Ultra [employer]  .  JARDIANCE 25 MG TABS tablet Take 25 mg by mouth daily.   Marland Kitchen KOMBIGLYZE XR 08-998 MG TB24 Take 1 tablet by mouth every morning.  . Lancets (ONETOUCH DELICA PLUS [id number]) MISC OneTouch Delica Plus Lancet 33 gauge  . melatonin 3 MG TABS tablet Take 3 mg by mouth at bedtime as needed (sleep).  . Multiple Vitamin (MULTI-VITAMINS PO) Take 1 tablet by mouth daily.  . nitroGLYCERIN (NITROSTAT) 0.4 MG SL tablet PLACE 1 TABLET UNDER TONGUE EVERY 5 MINUTES AS NEEDED FOR CHEST PAIN (UP TO 3 DOSES)  . polyvinyl alcohol (LIQUIFILM TEARS) 1.4 % ophthalmic solution Place 1 drop into both eyes as needed for dry eyes.  Marland Kitchen PRADAXA 150 MG CAPS capsule TAKE 1 CAPSULE BY MOUTH 2 TIMES DAILY  . Probiotic Product (PROBIOTIC PO) Take 1 capsule by mouth daily.  Marland Kitchen REPATHA SURECLICK 469 MG/ML SOAJ Inject 140 mg into the skin every 14 (fourteen) days.  . Turmeric 400 MG CAPS See admin instructions.     Allergies:   Atorvastatin, Sulfa antibiotics, Ace inhibitors, Amoxicillin, Meperidine, Naproxen sodium, Sulfamethoxazole,  and Tetracycline   Social History   Socioeconomic History  . Marital status: Married    Spouse name: Not on file  . Number of children: Not on file  . Years of education: Not on file  . Highest education level: Not on file  Occupational History  . Not on file  Tobacco Use  . Smoking status: Never Smoker  . Smokeless tobacco: Never Used  Vaping Use  . Vaping Use: Never used  Substance and Sexual Activity  . Alcohol use: Not Currently  . Drug use: Never  . Sexual activity: Not on file  Other Topics Concern  . Not on file  Social History Narrative  . Not on file   Social Determinants of Health   Financial Resource Strain: Not on file  Food Insecurity: Not on file  Transportation Needs: Not on file  Physical Activity: Not on file  Stress: Not on file  Social Connections: Not on file     Family History: The patient's family history includes Cancer in his mother; Diabetes in his  father and mother; Heart disease in his father and mother; Stroke in his father. ROS:   Please see the history of present illness.    All other systems reviewed and are negative.  EKGs/Labs/Other Studies Reviewed:    The following studies were reviewed today:    Recent Labs: 12/14/2019: ALT 21 12/17/2019: BUN 23; Creatinine, Ser 1.26; Hemoglobin 15.0; Platelets 155; Potassium 3.7; Sodium 138    Physical Exam:    VS:  BP (!) 146/60   Pulse 82   Ht 5\' 11"  (1.803 m)   Wt 272 lb 0.6 oz (123.4 kg)   SpO2 96%   BMI 37.94 kg/m     Wt Readings from Last 3 Encounters:  07/03/20 272 lb 0.6 oz (123.4 kg)  01/03/20 274 lb (124.3 kg)  12/15/19 268 lb 15.4 oz (122 kg)     GEN: Obese well nourished, well developed in no acute distress HEENT: Normal NECK: No JVD; No carotid bruits LYMPHATICS: No lymphadenopathy CARDIAC: RRR, no murmurs, rubs, gallops RESPIRATORY:  Clear to auscultation without rales, wheezing or rhonchi  ABDOMEN: Soft, non-tender, non-distended MUSCULOSKELETAL:  No edema; No deformity  SKIN: Warm and dry NEUROLOGIC:  Alert and oriented x 3 PSYCHIATRIC:  Normal affect    Signed, Shirlee More, MD  07/03/2020 8:41 AM    [place]

## 2020-07-03 ENCOUNTER — Ambulatory Visit: Payer: Medicare PPO | Admitting: Cardiology

## 2020-07-03 ENCOUNTER — Other Ambulatory Visit: Payer: Self-pay

## 2020-07-03 ENCOUNTER — Encounter: Payer: Self-pay | Admitting: Cardiology

## 2020-07-03 VITALS — BP 146/60 | HR 82 | Ht 71.0 in | Wt 272.0 lb

## 2020-07-03 DIAGNOSIS — I110 Hypertensive heart disease with heart failure: Secondary | ICD-10-CM

## 2020-07-03 DIAGNOSIS — I482 Chronic atrial fibrillation, unspecified: Secondary | ICD-10-CM | POA: Diagnosis not present

## 2020-07-03 DIAGNOSIS — I25119 Atherosclerotic heart disease of native coronary artery with unspecified angina pectoris: Secondary | ICD-10-CM

## 2020-07-03 DIAGNOSIS — Z7901 Long term (current) use of anticoagulants: Secondary | ICD-10-CM

## 2020-07-03 DIAGNOSIS — E785 Hyperlipidemia, unspecified: Secondary | ICD-10-CM

## 2020-07-03 NOTE — Patient Instructions (Signed)

## 2020-08-15 ENCOUNTER — Telehealth: Payer: Self-pay | Admitting: Cardiology

## 2020-08-15 NOTE — Telephone Encounter (Signed)
Called and spoke with pt who denies any stroke sx. Pt has an appt tomorrow with Dr. [name] but advised to call 911 or go to the ED for changes or  F--Face: Ask the person to smile. Does one side of the face droop?  A--Arms: Ask the person to raise both arms. Does one arm drift downward?  S--Speech: Ask the person to repeat a simple phrase. Is the speech slurred or strange?  T--Time: If you see any of these signs, call 9-1-1 right away.   Pt verbalized understanding and had no additional questions.

## 2020-08-15 NOTE — Telephone Encounter (Signed)
Pt c/o BP issue: STAT if pt c/o blurred vision, one-sided weakness or slurred speech  1. What are your last 5 BP readings? 150/80: 166/85; 163;77 166;79 153/68  2. Are you having any other symptoms (ex. Dizziness, headache, blurred vision, passed out)? Dizziness and headache   3. What is your BP issue? Feeling very fatigue, stumbling when walking, sob  Was able to schedule patient tomorrow at 920 with Dr. [name]

## 2020-08-16 ENCOUNTER — Other Ambulatory Visit: Payer: Self-pay

## 2020-08-16 ENCOUNTER — Encounter: Payer: Self-pay | Admitting: Cardiology

## 2020-08-16 ENCOUNTER — Ambulatory Visit: Payer: Medicare PPO | Admitting: Cardiology

## 2020-08-16 ENCOUNTER — Ambulatory Visit (INDEPENDENT_AMBULATORY_CARE_PROVIDER_SITE_OTHER): Payer: Medicare PPO

## 2020-08-16 VITALS — BP 116/59 | HR 64 | Ht 71.0 in | Wt 273.0 lb

## 2020-08-16 DIAGNOSIS — Z789 Other specified health status: Secondary | ICD-10-CM

## 2020-08-16 DIAGNOSIS — I110 Hypertensive heart disease with heart failure: Secondary | ICD-10-CM | POA: Diagnosis not present

## 2020-08-16 DIAGNOSIS — Z7901 Long term (current) use of anticoagulants: Secondary | ICD-10-CM | POA: Diagnosis not present

## 2020-08-16 DIAGNOSIS — I951 Orthostatic hypotension: Secondary | ICD-10-CM | POA: Diagnosis not present

## 2020-08-16 DIAGNOSIS — I482 Chronic atrial fibrillation, unspecified: Secondary | ICD-10-CM

## 2020-08-16 DIAGNOSIS — I25119 Atherosclerotic heart disease of native coronary artery with unspecified angina pectoris: Secondary | ICD-10-CM | POA: Diagnosis not present

## 2020-08-16 DIAGNOSIS — E785 Hyperlipidemia, unspecified: Secondary | ICD-10-CM

## 2020-08-16 NOTE — Patient Instructions (Signed)
Medication Instructions:  Your physician has recommended you make the following change in your medication:  STOP: Jardiance  *If you need a refill on your cardiac medications before your next appointment, please call your pharmacy*   Lab Work: None If you have labs (blood work) drawn today and your tests are completely normal, you will receive your results only by: Marland Kitchen MyChart Message (if you have MyChart) OR . A paper copy in the mail If you have any lab test that is abnormal or we need to change your treatment, we will call you to review the results.   Testing/Procedures: A zio monitor was ordered today. It will remain on for 7 days. You will then return monitor and event diary in provided box. It takes 1-2 weeks for report to be downloaded and returned to [place]. We will call you with the results. If monitor falls off or has orange flashing light, please call Zio for further instructions.      Follow-Up: At [facility], you and your health needs are our priority.  As part of our continuing mission to provide you with exceptional heart care, we have created designated Provider Care Teams.  These Care Teams include your primary Cardiologist (physician) and Advanced Practice Providers (APPs -  Physician Assistants and Nurse Practitioners) who all work together to provide you with the care you need, when you need it.  We recommend signing up for the patient portal called "MyChart".  Sign up information is provided on this After Visit Summary.  MyChart is used to connect with patients for Virtual Visits (Telemedicine).  Patients are able to view lab/test results, encounter notes, upcoming appointments, etc.  Non-urgent messages can be sent to your provider as well.   To learn more about what you can do with MyChart, go to [email or web address].    Your next appointment:   6 month(s)  The format for your next appointment:   In Person  Provider:   Shirlee More, MD   Other  Instructions

## 2020-08-16 NOTE — Progress Notes (Signed)
Cardiology Office Note:    Date:  08/16/2020   ID:  Shawn Blanchard, DOB 1936/02/27, MRN [id number]  PCP:  Algis Greenhouse, MD  Cardiologist:  Shirlee More, MD    Referring MD: Algis Greenhouse, MD    ASSESSMENT:    1. Orthostatic hypotension   2. Coronary artery disease involving native coronary artery of native heart with angina pectoris ([place])   3. Chronic atrial fibrillation ([place])   4. Chronic anticoagulation   5. Hypertensive heart disease with heart failure ([place])   6. Dyslipidemia   7. Statin intolerance    PLAN:    In order of problems listed above:  1. Unfortunately he is having recurrent orthostatic symptoms has a significant drop of systolic blood pressure in my office.  He takes an SGLT2 inhibitor and plan to stop it at this point in time he will continue his other preparation of Janumet.  He will continue to trend her heart rate and blood pressure at home and will also do a 7-day ZIO monitor to assess for rate control and bradycardia with atrial fibrillation. 2. Stable CAD no anginal discomfort continue medical therapy including his beta-blocker and lipid-lowering with Repatha and Zetia. 3. Check event monitor continue beta-blocker and current anticoagulant 4. Systolics are running in the 150s to 160s but he is symptomatic orthostatic hypotension at this time I would not intensify medical therapy. 5. Continue PCSK9 inhibitor he is statin intolerant   Next appointment: 3 months   Medication Adjustments/Labs and Tests Ordered: Current medicines are reviewed at length with the patient today.  Concerns regarding medicines are outlined above.  Orders Placed This Encounter  Procedures  . LONG TERM MONITOR (3-14 DAYS)  . EKG 12-Lead   No orders of the defined types were placed in this encounter.   Chief Complaint  Patient presents with  . Follow-up  . Coronary Artery Disease  . Hypertension  . Hyperlipidemia    History of Present Illness:    Shawn Blanchard is a 85 y.o. male with a hx of CAD with PCI to the LAD in 2002 heart failure ejection fraction 47% PAD CKD chronic atrial fibrillation on Pradaxa hypertensive heart disease and orthostatic hypotension with SGLT2 inhibitor and hyperlipidemia last seen 07/04/2019.  compliance with diet, lifestyle and medications: Yes  His wife is present she is concerned that he had a fall when he shifted posterior after prolonged sitting while coughing.  He has longstanding symptoms of orthostatic lightheadedness he has been tracking his blood pressure at home with a digital cuff and has not had episodes.  In my office today He had a significant drop of close to 50 mmHg on standing. The symptoms are longstanding worse with prolonged immobilization did not lose consciousness. He exercises on a regular basis no exercise intolerance syncope chest pain or palpitation. He tolerates his anticoagulant without bleeding.  Recent labs [facility] on 07/04/2020: Hemoglobin A1c 7.2% Cholesterol 55 LDL 14 triglycerides 123 HDL 31 CMP with a sodium 139 potassium 4.4 creatinine elevated 1.31 with a GFR 54 cc normal liver function test Hemoglobin 15.7  Echocardiogram 10/15/2019 showed ejection fraction 45 to 50% mild concentric LVH mild right ventricular dysfunction moderate mitral annular calcification with trivial regurgitation mild aortic regurgitation without findings of aortic stenosis.  He is admitted to [facility] September 2021 with recurrent small bowel obstruction and SIRS present on admission to the hospital.  Past Medical History:  Diagnosis Date  . Acute on chronic systolic  congestive heart failure ([place]) 11/07/2017  . 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 Arthritis 03/12/2016  . Bilateral hand pain 03/25/2019  . Blepharitis of both upper and lower eyelid 10/02/2018  . Carpal tunnel syndrome of right wrist 03/25/2019  . Chronic anticoagulation 10/11/2016  . Chronic atrial  fibrillation ([place]) 06/12/2015   Overview:  Managed CARDS  . Chronic diastolic heart failure ([place]) 09/01/2015  . Coronary artery disease involving native coronary artery of native heart with angina pectoris ([place]) 02/15/2015   Overview:  Hx of MI with stent to LAD 2002 MPS 2016 with EF 47% and no ischemia  . Dermatochalasis of both upper eyelids 11/21/2016  . Dermatochalasis of right upper eyelid 10/02/2018  . Diabetic polyneuropathy associated with type 2 diabetes mellitus ([place]) 12/16/2018   2020  . Dyslipidemia 11/07/2017  . Essential hypertension 11/07/2017  . Ganglion cyst 11/27/2015   Overview:  2017: dorsum right hand  . Gastroesophageal reflux disease without esophagitis 06/12/2015  . Hyperlipidemia 04/11/2016  . Hypertensive heart disease with heart failure ([place]) 02/15/2015  . Idiopathic chronic gout 05/17/2015  . Insomnia 11/27/2015  . Leukocytosis 04/11/2016  . Lightheadedness 10/11/2016  . Long term current use of oral hypoglycemic drug 10/02/2018  . 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 Male erectile disorder 06/17/2016  . Meibomian gland dysfunction (MGD) of both eyes 10/02/2018  . MI (myocardial infarction) ([place]) 02/15/2015   Overview:  2002  . Obesity 04/11/2016  . 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 exacerbation ([place]) 06/12/2015  . Obstructive sleep apnea 08/30/2015   Overview:  CPAP  . Old MI (myocardial infarction)   . Orthostatic dizziness 09/15/2017   2018: med related  . Permanent atrial fibrillation ([place]) 06/12/2015   Overview:  Managed CARDS  . Pseudophakia of both eyes 03/12/2016  . Pulmonary hypertension ([place]) 08/30/2015   Overview:  Managed CARDS  . Purulent inflammation of skin 12/29/2018  . Restless leg syndrome 08/30/2015  . Restrictive lung disease 12/26/2016   Overview:  2018: mod on spirometry  . SBO (small bowel obstruction) ([place]) 12/14/2019  . Senile nuclear sclerosis 05/02/2016  . Small bowel obstruction ([place]) 04/11/2016  . SOB (shortness of breath) 08/14/2015  . Type 2  diabetes mellitus with neurologic complication, without long-term current use of insulin ([place]) 12/02/2014  . Unilateral inguinal hernia without obstruction or gangrene 06/12/2015    Past Surgical History:  Procedure Laterality Date  . CATARACT EXTRACTION Left   . CERVICAL SPINE SURGERY    . CHOLECYSTECTOMY    . NASAL SINUS SURGERY    . PARTIAL KNEE ARTHROPLASTY Bilateral   . SMALL INTESTINE SURGERY    . TONSILLECTOMY      Current Medications: Current Meds  Medication Sig  . ALPHA LIPOIC ACID PO Take 1 capsule by mouth in the morning and at bedtime.  . carvedilol (COREG) 3.125 MG tablet Take 1 tablet (3.125 mg total) by mouth 2 (two) times daily with a meal.  . Cyanocobalamin (VITAMIN B 12 PO) Take by mouth daily.  Marland Kitchen ezetimibe (ZETIA) 10 MG tablet Take 10 mg by mouth daily.  . furosemide (LASIX) 20 MG tablet TAKE 1 TABLET (20 MG) BY MOUTH TWICE DAILY  . gabapentin (NEURONTIN) 300 MG capsule Take 300 mg by mouth 2 (two) times daily.  Marland Kitchen glucose blood test strip OneTouch Ultra [employer]  . JANUMET XR [phone] MG TB24 Take 1 tablet by mouth every morning.  . Lancets (ONETOUCH DELICA PLUS [id number]) MISC OneTouch Delica Plus Lancet 33 gauge  .  melatonin 3 MG TABS tablet Take 3 mg by mouth at bedtime as needed (sleep).  . Multiple Vitamin (MULTI-VITAMINS PO) Take 1 tablet by mouth daily.  . nitroGLYCERIN (NITROSTAT) 0.4 MG SL tablet PLACE 1 TABLET UNDER TONGUE EVERY 5 MINUTES AS NEEDED FOR CHEST PAIN (UP TO 3 DOSES)  . polyvinyl alcohol (LIQUIFILM TEARS) 1.4 % ophthalmic solution Place 1 drop into both eyes as needed for dry eyes.  Marland Kitchen PRADAXA 150 MG CAPS capsule TAKE 1 CAPSULE BY MOUTH 2 TIMES DAILY  . REPATHA SURECLICK 948 MG/ML SOAJ Inject 140 mg into the skin every 14 (fourteen) days.  . Turmeric 400 MG CAPS See admin instructions.  Marland Kitchen UNABLE TO FIND Take 1 capsule by mouth in the morning and at bedtime. CURALIN  . [DISCONTINUED] JARDIANCE 25 MG TABS tablet Take 25 mg by mouth daily.       Allergies:   Atorvastatin, Sulfa antibiotics, Ace inhibitors, Amoxicillin, Meperidine, Meperidine hcl, Naproxen sodium, Sulfamethoxazole, and Tetracycline   Social History   Socioeconomic History  . Marital status: Married    Spouse name: Not on file  . Number of children: Not on file  . Years of education: Not on file  . Highest education level: Not on file  Occupational History  . Not on file  Tobacco Use  . Smoking status: Never Smoker  . Smokeless tobacco: Never Used  Vaping Use  . Vaping Use: Never used  Substance and Sexual Activity  . Alcohol use: Not Currently  . Drug use: Never  . Sexual activity: Not on file  Other Topics Concern  . Not on file  Social History Narrative  . Not on file   Social Determinants of Health   Financial Resource Strain: Not on file  Food Insecurity: Not on file  Transportation Needs: Not on file  Physical Activity: Not on file  Stress: Not on file  Social Connections: Not on file     Family History: The patient's family history includes Cancer in his mother; Diabetes in his father and mother; Heart disease in his father and mother; Stroke in his father. ROS:   Please see the history of present illness.    All other systems reviewed and are negative.  EKGs/Labs/Other Studies Reviewed:    The following studies were reviewed today:  EKG:  EKG ordered today and personally reviewed.  The ekg ordered today demonstrates atrial fibrillation controlled ventricular rate  Recent Labs: 12/14/2019: ALT 21 12/17/2019: BUN 23; Creatinine, Ser 1.26; Hemoglobin 15.0; Platelets 155; Potassium 3.7; Sodium 138  Recent Lipid Panel No results found for: CHOL, TRIG, HDL, CHOLHDL, VLDL, LDLCALC, LDLDIRECT  Physical Exam:    VS:  BP (!) 116/59 (BP Location: Left Arm, Patient Position: Standing)   Pulse 64   Ht 5\' 11"  (1.803 m)   Wt 273 lb (123.8 kg)   SpO2 99%   BMI 38.08 kg/m     Wt Readings from Last 3 Encounters:  08/16/20 273 lb  (123.8 kg)  07/03/20 272 lb 0.6 oz (123.4 kg)  01/03/20 274 lb (124.3 kg)     GEN:  Well nourished, well developed in no acute distress HEENT: Normal NECK: No JVD; No carotid bruits LYMPHATICS: No lymphadenopathy CARDIAC: Irregular rhythm S1 variable  no murmurs, rubs, gallops RESPIRATORY:  Clear to auscultation without rales, wheezing or rhonchi  ABDOMEN: Soft, non-tender, non-distended MUSCULOSKELETAL:  No edema; No deformity  SKIN: Warm and dry NEUROLOGIC:  Alert and oriented x 3 PSYCHIATRIC:  Normal affect  Signed, Shirlee More, MD  08/16/2020 12:16 PM    [place]

## 2020-08-23 DIAGNOSIS — I482 Chronic atrial fibrillation, unspecified: Secondary | ICD-10-CM | POA: Diagnosis not present

## 2020-08-28 ENCOUNTER — Telehealth: Payer: Self-pay | Admitting: Cardiology

## 2020-08-28 NOTE — Telephone Encounter (Signed)
PT STATES HE HAS A BRUISE ON HIS REAREND AND HIS KNEE, PT STATES THAT HE HAS ORTHOSTATIC HYPERTENSION, STATES THAT HE NEEDS A HANDICAP STICKER, [place]. PT NEEDS FOLLOW UP W/ NURSE

## 2020-08-28 NOTE — Telephone Encounter (Signed)
Spoke to the patient just now and let him know that he will need to contact his PCP for the walker/wheelchair but that if he brings in the form for the handicap placard we could get this filled out for him.

## 2020-08-31 ENCOUNTER — Telehealth: Payer: Self-pay

## 2020-08-31 NOTE — Telephone Encounter (Signed)
-----   Message from Richardo Priest, MD sent at 08/31/2020  4:51 PM EDT ----- Good result no slow heartbeats or pauses in his heart rhythm and atrial fibrillation is well controlled.  No change in treatment

## 2020-08-31 NOTE — Telephone Encounter (Signed)
Left message on patients voicemail to please return our call.   

## 2020-09-01 ENCOUNTER — Telehealth: Payer: Self-pay

## 2020-09-01 NOTE — Telephone Encounter (Signed)
Left message on patients voicemail to please return our call.   

## 2020-09-01 NOTE — Telephone Encounter (Signed)
-----   Message from Richardo Priest, MD sent at 08/31/2020  4:51 PM EDT ----- Good result no slow heartbeats or pauses in his heart rhythm and atrial fibrillation is well controlled.  No change in treatment

## 2020-09-01 NOTE — Telephone Encounter (Signed)
Spoke with patient regarding results and recommendation.  Patient verbalizes understanding and is agreeable to plan of care. Advised patient to call back with any issues or concerns.  

## 2020-09-13 ENCOUNTER — Other Ambulatory Visit: Payer: Self-pay | Admitting: Cardiology

## 2020-09-13 NOTE — Telephone Encounter (Signed)
Refill sent to pharmacy.   

## 2020-10-06 ENCOUNTER — Ambulatory Visit: Payer: Medicare PPO | Admitting: Family Medicine

## 2020-10-19 ENCOUNTER — Ambulatory Visit: Payer: Medicare PPO | Admitting: Family Medicine

## 2020-11-07 ENCOUNTER — Other Ambulatory Visit: Payer: Self-pay

## 2020-11-08 ENCOUNTER — Ambulatory Visit: Payer: Medicare PPO | Admitting: Family Medicine

## 2020-11-08 ENCOUNTER — Encounter: Payer: Self-pay | Admitting: Family Medicine

## 2020-11-08 VITALS — BP 138/76 | HR 64 | Temp 97.5°F | Ht 70.0 in | Wt 261.6 lb

## 2020-11-08 DIAGNOSIS — I25119 Atherosclerotic heart disease of native coronary artery with unspecified angina pectoris: Secondary | ICD-10-CM

## 2020-11-08 DIAGNOSIS — N1831 Chronic kidney disease, stage 3a: Secondary | ICD-10-CM

## 2020-11-08 DIAGNOSIS — E1140 Type 2 diabetes mellitus with diabetic neuropathy, unspecified: Secondary | ICD-10-CM

## 2020-11-08 DIAGNOSIS — I872 Venous insufficiency (chronic) (peripheral): Secondary | ICD-10-CM

## 2020-11-08 DIAGNOSIS — E782 Mixed hyperlipidemia: Secondary | ICD-10-CM

## 2020-11-08 DIAGNOSIS — I482 Chronic atrial fibrillation, unspecified: Secondary | ICD-10-CM

## 2020-11-08 DIAGNOSIS — K660 Peritoneal adhesions (postprocedural) (postinfection): Secondary | ICD-10-CM

## 2020-11-08 DIAGNOSIS — R42 Dizziness and giddiness: Secondary | ICD-10-CM

## 2020-11-08 HISTORY — DX: Peritoneal adhesions (postprocedural) (postinfection): K66.0

## 2020-11-08 HISTORY — DX: Venous insufficiency (chronic) (peripheral): I87.2

## 2020-11-08 MED ORDER — MECLIZINE HCL 25 MG PO TABS: 25.0000 mg | ORAL_TABLET | Freq: Three times a day (TID) | ORAL | 0 refills | Status: DC | PRN

## 2020-11-08 NOTE — Progress Notes (Signed)
[facility] [address] [place] [address] Dept: [phone] Dept Fax: [phone]  New Patient Office Visit  Subjective:    Patient ID: Shawn Blanchard, male    DOB: 01/08/1936, 85 y.o..   MRN: [id number]  Chief Complaint  Patient presents with   Establish Care    NP- establish care.  C/o having some vertigo and ortho hypertension.    History of Present Illness:  Patient is in today to establish care. Mr. Linch is originally from [place]. He lived in multiple locations before settling in [place], [place]. 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 Mr. Hermann is married. He has 2 daughters and 4 granddaughters. He denies tobacco or drug use and rarely drinks alcohol.  Mr. Dillenbeck has an extensive history of CV disease. He had a prior heart attack and is s/p PTCA with stent placement. 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 He is currently managed on carvedilol and Lasix. he is also on Pradaxa for anticoagulation. He has hyperlipidemia, but is intolerant of statins, so is managed on ezetimibe and Repatha.  Mr. Micke has a history of sleep apnea and uses a CPAP machine. He feels this has been very helpful.  Mr. Hardage has a history of adenocarcinoma of the small intestine. He had this resected in 1990. He has had issues with partial small bowel obstruction due to adhesions and had several surgeries for lysis of adhesions. in more recent years, they have relieved obstruction with an upper endoscopy.  Mr. 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 He is managed on [place]. His blood sugars at home average below 140.  Mr. Schillaci has some insomnia and uses melatonin. He also has restless legs, though his wife notes movements in her arms and trunk when he sleeps as well.  he is not on any particular medication for this.  Mr. Marro has a past history of gout, though he has not had an attack in quite some time. He has used allopurinol and colchicine in the past. he states he used the allopurinol episodically when he has attacks.  More recently, Mr. Nishi has had episodes of orthostasis and vertigo. review of the record shows the orthostasis and lightheadedness have been a recurrent issue for him. The vertigo has been episodic and resolves within a day or two when this occurs. He was taking an OTC medication for this.  Past Medical History: Patient Active Problem List   Diagnosis Date Noted   Peritoneal adhesion 11/08/2020   Peripheral venous insufficiency 11/08/2020   Type 2 diabetes mellitus with stage 3a chronic kidney disease, without long-term current use of insulin ([place]) 02/22/2020   Old MI (myocardial infarction)    Floppy eyelid syndrome of both eyes 10/05/2019   Sensorineural hearing loss (SNHL), bilateral 10/04/2019   Stage 3a chronic kidney disease ([place]) 06/13/2019   Carpal tunnel syndrome of right wrist 03/25/2019   Diabetic polyneuropathy associated with type 2 diabetes mellitus ([place]) 12/16/2018   Blepharitis of both upper and lower eyelid 10/02/2018   Dermatochalasis of right upper eyelid 10/02/2018   Meibomian gland dysfunction (MGD) of both eyes 10/02/2018   Essential hypertension 11/07/2017   Acute on chronic systolic congestive heart failure ([place]) 11/07/2017   Orthostatic dizziness 09/15/2017   Dermatochalasis of both upper eyelids 11/21/2016   Chronic anticoagulation 10/11/2016   Male erectile  disorder 06/17/2016   Hyperlipidemia 04/11/2016   Small bowel obstruction ([place]) 04/11/2016   Obesity 04/11/2016   Arthritis 03/12/2016   Pseudophakia of both eyes 03/12/2016   Insomnia 11/27/2015   Ganglion cyst 11/27/2015   Chronic diastolic heart failure ([place]) 09/01/2015   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[place]) 08/30/2015   Lymphoma of  small intestine ([place]) 08/30/2015   Obstructive sleep apnea 08/30/2015   Pulmonary hypertension ([place]) 08/30/2015   Restless leg syndrome 08/30/2015   Chronic atrial fibrillation (HCC) 06/12/2015   Gastroesophageal reflux disease without esophagitis 06/12/2015   Idiopathic chronic gout 05/17/2015   Coronary artery disease involving native coronary artery of native heart with angina pectoris ([place]) 02/15/2015   Hypertensive heart disease with heart failure ([place]) 02/15/2015   Type 2 diabetes mellitus with neurologic complication, without long-term current use of insulin ([place]) 12/02/2014   Past Surgical History:  Procedure Laterality Date   ABDOMINAL ADHESION SURGERY     x 2   BLEPHAROPLASTY Bilateral    CARPAL TUNNEL RELEASE Right 2021   CATARACT EXTRACTION Bilateral    CERVICAL SPINE SURGERY     CHOLECYSTECTOMY     CORONARY ANGIOPLASTY WITH STENT PLACEMENT  2000   NASAL SINUS SURGERY     ORIF TIBIAL SHAFT FRACTURE W/ PLATES AND SCREWS Left    PARTIAL KNEE ARTHROPLASTY Bilateral    SMALL INTESTINE SURGERY     Adenocarcinoma   TONSILLECTOMY     Family History  Problem Relation Age of Onset   Cancer Mother        Ovarian   Diabetes Mother    Heart disease Mother    Heart disease Father    Stroke Father    Diabetes Father    Diabetes Sister    Diabetes Brother    Diabetes Brother    Cancer Paternal Grandfather        Prostate   Outpatient Medications Prior to Visit  Medication Sig Dispense Refill   acetaminophen (TYLENOL) 325 MG tablet Take 325 mg by mouth every 6 (six) hours as needed for mild pain or headache.     ALPHA LIPOIC ACID PO Take 1 capsule by mouth in the morning and at bedtime.     Blood Glucose Monitoring Suppl (ACCU-CHEK GUIDE) w/Device KIT USE TO CHECK BLOOD SUGAR DAILY AS DIRECTED     carvedilol (COREG) 3.125 MG tablet TAKE 1 TABLET BY MOUTH 2 TIMES A DAY WITH A MEAL 180 tablet 3   Cyanocobalamin (VITAMIN B 12 PO) Take by mouth daily.     empagliflozin  (JARDIANCE) 25 MG TABS tablet      ezetimibe (ZETIA) 10 MG tablet Take 10 mg by mouth daily.     fluorouracil (EFUDEX) 5 % cream 1 application     furosemide (LASIX) 20 MG tablet TAKE 1 TABLET (20 MG) BY MOUTH TWICE DAILY 180 tablet 3   gabapentin (NEURONTIN) 300 MG capsule Take 300 mg by mouth 2 (two) times daily.     glucose blood test strip OneTouch Ultra Blue Test Strip     JANUMET XR [phone] MG TB24 Take 1 tablet by mouth every morning.     Lancets (ONETOUCH DELICA PLUS [id number]) MISC OneTouch Delica Plus Lancet 33 gauge     Meclizine HCl 25 MG CHEW 1 tablet as needed     melatonin 3 MG TABS tablet Take 3 mg by mouth at bedtime as needed (sleep).     metFORMIN (GLUCOPHAGE-XR) 500 MG 24 hr tablet Take by mouth.  Multiple Vitamin (MULTI-VITAMINS PO) Take 1 tablet by mouth daily.     nitroGLYCERIN (NITROSTAT) 0.4 MG SL tablet PLACE 1 TABLET UNDER TONGUE EVERY 5 MINUTES AS NEEDED FOR CHEST PAIN (UP TO 3 DOSES) 25 tablet 11   polyvinyl alcohol (LIQUIFILM TEARS) 1.4 % ophthalmic solution Place 1 drop into both eyes as needed for dry eyes.     PRADAXA 150 MG CAPS capsule TAKE 1 CAPSULE BY MOUTH 2 TIMES DAILY 180 capsule 2   REPATHA SURECLICK 914 MG/ML SOAJ Inject 140 mg into the skin every 14 (fourteen) days.     Turmeric 400 MG CAPS See admin instructions.     UNABLE TO FIND Take 1 capsule by mouth in the morning and at bedtime. CURALIN     allopurinol (ZYLOPRIM) 100 MG tablet Take 100 mg by mouth 2 (two) times daily as needed (gout).     Probiotic Product (PROBIOTIC PO) Take 1 capsule by mouth daily. (Patient not taking: No sig reported)     No facility-administered medications prior to visit.   Allergies  Allergen Reactions   Atorvastatin Other (See Comments)    Dizziness.   Sulfa Antibiotics    Ace Inhibitors Rash   Amoxicillin Rash   Meperidine Rash   Meperidine Hcl Rash   Naproxen Sodium Rash   Sulfamethoxazole Rash   Tetracycline Rash   Objective:   Today's Vitals    11/08/20 1405  BP: 138/76  Pulse: 64  Temp: (!) 97.5 F (36.4 C)  TempSrc: Temporal  SpO2: 96%  Weight: 261 lb 9.6 oz (118.7 kg)  Height: 5' 10"  (1.778 m)   Body mass index is 37.54 kg/m.   General: Well developed, well nourished. No acute distress. Feet: Mild dry skin. Great nails with onychogryphosis, but well trimmed. Pulses 1+. Some areas insensate to 5.07 monofilament. Psych: Alert and oriented. Normal mood and affect.  Health Maintenance Due  Topic Date Due   FOOT EXAM  Never done   OPHTHALMOLOGY EXAM  Never done   URINE MICROALBUMIN  Never done   Zoster Vaccines- Shingrix (1 of 2) Never done   TETANUS/TDAP  12/08/2018   HEMOGLOBIN A1C  06/12/2020   INFLUENZA VACCINE  11/06/2020     Imaging: Echocardiogram (12/16/2019) IMPRESSIONS  1. LV visually appears 45-50%. Reduced sensitivity for focal wall motion abnormalities on this study, but appears mildly globally hypokinetic  without clear focal features. Left ventricular ejection fraction, by estimation, is 45 to 50%. The left ventricle has mildly decreased function. The left ventricle demonstrates  global hypokinesis. There is mild concentric left ventricular hypertrophy. Left ventricular diastolic function could not be evaluated.   2. Right ventricular systolic function is mildly reduced. The right ventricular size is normal. Tricuspid regurgitation signal is inadequate  for assessing PA pressure.   3. The mitral valve is normal in structure. Trivial mitral valve regurgitation. No evidence of mitral stenosis. Moderate mitral annular  calcification.   4. The aortic valve is tricuspid. There is moderate calcification of the aortic valve. There is mild thickening of the aortic valve. Aortic valve  regurgitation is mild. Mild to moderate aortic valve sclerosis/calcification is present, without any evidence of aortic stenosis.   5. The inferior vena cava is dilated in size with >50% respiratory variability, suggesting right  atrial pressure of 8 mmHg.   Assessment & Plan:   1. Type 2 diabetes mellitus with diabetic neuropathy, without long-term current use of insulin (HCC) We will check annual DM labs. He will continue his current  meds for now and adjust if the HbA1c indicates a need. .  - Microalbumin / creatinine urine ratio; Future - Basic metabolic panel; Future - Hemoglobin A1c; Future - Urinalysis, Routine w reflex microscopic; Future  2. Coronary artery disease involving native coronary artery of native heart with angina pectoris ([place]) Currently compensated. Continue on carvedilol and Lasix.  3. Chronic atrial fibrillation (HCC) Rate is controlled. . Continue anticoagulation with Pradaxa.  4. Stage 3a chronic kidney disease ([place]) I will reassess renal function. He will continue to follow with Dr. Obie Dredge for nephrology  5. Mixed hyperlipidemia We will reassess lipids.  - Lipid panel; Future  6. Orthostatic dizziness Recommend he hydrate well. I am hesitant to stop his Lasix or carvedilol. I recommend he discuss this with his cardiologist.  7. Vertigo The vertigo is episodic. I will prescribe meclizine for now. If persistent, he may need an neurology referral.  - meclizine (ANTIVERT) 25 MG tablet; Take 1 tablet (25 mg total) by mouth 3 (three) times daily as needed for dizziness.  Dispense: 30 tablet; Refill: 0   Haydee Salter, MD

## 2020-11-09 ENCOUNTER — Other Ambulatory Visit: Payer: Self-pay

## 2020-11-09 ENCOUNTER — Other Ambulatory Visit (INDEPENDENT_AMBULATORY_CARE_PROVIDER_SITE_OTHER): Payer: Medicare PPO

## 2020-11-09 DIAGNOSIS — E1140 Type 2 diabetes mellitus with diabetic neuropathy, unspecified: Secondary | ICD-10-CM

## 2020-11-09 DIAGNOSIS — E782 Mixed hyperlipidemia: Secondary | ICD-10-CM | POA: Diagnosis not present

## 2020-11-09 LAB — LIPID PANEL
Cholesterol: 82 mg/dL (ref 0–200)
HDL: 34.3 mg/dL — ABNORMAL LOW (ref >39.00)
LDL Cholesterol: 16 mg/dL (ref 0–99)
NonHDL: 47.86
Total CHOL/HDL Ratio: 2
Triglycerides: 157 mg/dL — ABNORMAL HIGH (ref 0.0–149.0)
VLDL: 31.4 mg/dL (ref 0.0–40.0)

## 2020-11-09 LAB — URINALYSIS, ROUTINE W REFLEX MICROSCOPIC
Bilirubin Urine: NEGATIVE
Hgb urine dipstick: NEGATIVE
Ketones, ur: NEGATIVE
Leukocytes,Ua: NEGATIVE
Nitrite: NEGATIVE
RBC / HPF: NONE SEEN (ref 0–?)
Specific Gravity, Urine: 1.015 (ref 1.000–1.030)
Total Protein, Urine: NEGATIVE
Urine Glucose: >=1000 — AB
Urobilinogen, UA: 0.2 (ref 0.0–1.0)
pH: 5.5 (ref 5.0–8.0)

## 2020-11-09 LAB — HEMOGLOBIN A1C: Hgb A1c MFr Bld: 7.7 % — ABNORMAL HIGH (ref 4.6–6.5)

## 2020-11-09 LAB — BASIC METABOLIC PANEL
BUN: 28 mg/dL — ABNORMAL HIGH (ref 6–23)
CO2: 26 mEq/L (ref 19–32)
Calcium: 8.7 mg/dL (ref 8.4–10.5)
Chloride: 99 mEq/L (ref 96–112)
Creatinine, Ser: 1.3 mg/dL (ref 0.40–1.50)
GFR: 51.05 mL/min — ABNORMAL LOW (ref >60.00)
Glucose, Bld: 148 mg/dL — ABNORMAL HIGH (ref 70–99)
Potassium: 4.1 mEq/L (ref 3.5–5.1)
Sodium: 136 mEq/L (ref 135–145)

## 2020-11-09 LAB — MICROALBUMIN / CREATININE URINE RATIO
Creatinine,U: 66.5 mg/dL
Microalb Creat Ratio: 1.3 mg/g (ref 0.0–30.0)
Microalb, Ur: 0.9 mg/dL (ref 0.0–1.9)

## 2020-11-10 ENCOUNTER — Telehealth: Payer: Self-pay | Admitting: Family Medicine

## 2020-11-10 ENCOUNTER — Encounter: Payer: Self-pay | Admitting: Family Medicine

## 2020-11-10 NOTE — Telephone Encounter (Signed)
Spoke to pt's daughter, Arrie Aran, regarding labs. He is taking both the Janumet and Jaradiance and Metformin.   Please advise.  Thanks.  Dm/cma

## 2020-11-10 NOTE — Telephone Encounter (Signed)
Pt's daughter([name]) would like a call back concerning pt's most recent lab results. Please advise at [phone].

## 2020-12-04 ENCOUNTER — Telehealth: Payer: Self-pay | Admitting: Family Medicine

## 2020-12-04 MED ORDER — ACCU-CHEK GUIDE W/DEVICE KIT: PACK | 0 refills | Status: DC

## 2020-12-04 MED ORDER — BLOOD GLUCOSE MONITOR SYSTEM W/DEVICE KIT: 1.0000 | PACK | 0 refills | Status: AC

## 2020-12-04 NOTE — Telephone Encounter (Signed)
Pts daughter called back. She wants to be sure the One Touch Monitor/Meter was called in.

## 2020-12-04 NOTE — Addendum Note (Signed)
Addended by: Konrad Saha on: 12/04/2020 12:31 PM   Modules accepted: Orders

## 2020-12-04 NOTE — Telephone Encounter (Signed)
Spoke to patients wife, and sent in the one touch device and supplies to [facility] [place]. Dm/cma

## 2020-12-04 NOTE — Telephone Encounter (Signed)
RX sent to pharmacy.  Patient notified [facility] phone. Dm/cma

## 2020-12-04 NOTE — Telephone Encounter (Signed)
What is the name of the medication? Blood Glucose Monitoring Suppl (ACCU-CHEK GUIDE) w/Device KIT [[id number]]. He is wanting the ONE TOUCH system.   Have you contacted your pharmacy to request a refill? Yes, they need a script.   Which pharmacy would you like this sent to[facility] [place] [facility] Phone: [phone]   Patient notified that their request is being sent to the clinical staff for review and that they should receive a call once it is complete. If they do not receive a call within 72 hours they can check with their pharmacy or our office.

## 2020-12-08 ENCOUNTER — Encounter: Payer: Self-pay | Admitting: Family Medicine

## 2020-12-08 ENCOUNTER — Other Ambulatory Visit: Payer: Self-pay

## 2020-12-08 ENCOUNTER — Ambulatory Visit: Payer: Medicare PPO | Admitting: Family Medicine

## 2020-12-08 ENCOUNTER — Telehealth: Payer: Self-pay

## 2020-12-08 VITALS — BP 132/70 | HR 66 | Temp 97.1°F | Wt 269.0 lb

## 2020-12-08 DIAGNOSIS — E1122 Type 2 diabetes mellitus with diabetic chronic kidney disease: Secondary | ICD-10-CM

## 2020-12-08 DIAGNOSIS — N1831 Chronic kidney disease, stage 3a: Secondary | ICD-10-CM | POA: Diagnosis not present

## 2020-12-08 DIAGNOSIS — I5032 Chronic diastolic (congestive) heart failure: Secondary | ICD-10-CM | POA: Diagnosis not present

## 2020-12-08 DIAGNOSIS — Z23 Encounter for immunization: Secondary | ICD-10-CM | POA: Diagnosis not present

## 2020-12-08 MED ORDER — METFORMIN HCL ER (MOD) 1000 MG PO TB24: 1000.0000 mg | ORAL_TABLET | Freq: Every day | ORAL | 3 refills | Status: DC

## 2020-12-08 NOTE — Telephone Encounter (Signed)
Spoke to patients Daughter, and advised on how to take his medications: Janumet in the am amd 2 tabs of Metformin in evening.  Also advised that patient doesn't need to be fasting at next appointment. No further questions. Dm/cma

## 2020-12-08 NOTE — Progress Notes (Signed)
[facility] [address] [place] [address] Dept: [phone] Dept Fax: [phone]  Office Visit  Subjective:    Patient ID: Shawn Blanchard, male    DOB: 30-Jul-1935, 85 y.o..   MRN: [id number]  Chief Complaint  Patient presents with   Follow-up    1 mo f/u    History of Present Illness:  Patient is in today for reassessment of his orthostasis. At his last visit, Mr. Nott has reported  episodes of orthostasis and vertigo. Review of the record showed the orthostasis and lightheadedness have been a recurrent issue for him, however, he felt there had been an increase in this. We stopped his Jardiance, as this was a known side effect of the medication. Since stopping this, he feels the orthostasis has decreased. He is pausing when he stands up to make sure he has adjusted to the change in position to reduce the chance of falling. He is also using a wheeled-walker where he can sit down if he needs to. Since stopping the Jardiance, his fasting blood sugars have run 150-180. He notes his diet has not been as good over the past several weeks. He did go to see his nephrologist recently.  Past Medical History: Patient Active Problem List   Diagnosis Date Noted   Peritoneal adhesion 11/08/2020   Peripheral venous insufficiency 11/08/2020   Type 2 diabetes mellitus with stage 3a chronic kidney disease, without long-term current use of insulin ([place]) 02/22/2020   Old MI (myocardial infarction)    Floppy eyelid syndrome of both eyes 10/05/2019   Sensorineural hearing loss (SNHL), bilateral 10/04/2019   Stage 3a chronic kidney disease ([place]) 06/13/2019   Carpal tunnel syndrome of right wrist 03/25/2019   Diabetic polyneuropathy associated with type 2 diabetes mellitus ([place]) 12/16/2018   Blepharitis of both upper and lower eyelid 10/02/2018   Dermatochalasis of right upper eyelid 10/02/2018   Meibomian gland dysfunction (MGD) of  both eyes 10/02/2018   Essential hypertension 11/07/2017   Acute on chronic systolic congestive heart failure ([place]) 11/07/2017   Orthostatic dizziness 09/15/2017   Dermatochalasis of both upper eyelids 11/21/2016   Chronic anticoagulation 10/11/2016   Male erectile disorder 06/17/2016   Hyperlipidemia 04/11/2016   Small bowel obstruction ([place]) 04/11/2016   Obesity 04/11/2016   Arthritis 03/12/2016   Pseudophakia of both eyes 03/12/2016   Insomnia 11/27/2015   Ganglion cyst 11/27/2015   Chronic diastolic heart failure ([place]) 09/01/2015   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[place]) 08/30/2015   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[place]) 08/30/2015   Obstructive sleep apnea 08/30/2015   Pulmonary hypertension ([place]) 08/30/2015   Restless leg syndrome 08/30/2015   Chronic atrial fibrillation ([place]) 06/12/2015   Gastroesophageal reflux disease without esophagitis 06/12/2015   Idiopathic chronic gout 05/17/2015   Coronary artery disease involving native coronary artery of native heart with angina pectoris ([place]) 02/15/2015   Hypertensive heart disease with heart failure ([place]) 02/15/2015   Type 2 diabetes mellitus with neurologic complication, without long-term current use of insulin ([place]) 12/02/2014   Past Surgical History:  Procedure Laterality Date   ABDOMINAL ADHESION SURGERY     x 2   BLEPHAROPLASTY Bilateral    CARPAL TUNNEL RELEASE Right 2021   CATARACT EXTRACTION Bilateral    CERVICAL SPINE SURGERY     CHOLECYSTECTOMY     CORONARY ANGIOPLASTY WITH STENT PLACEMENT  2000   NASAL SINUS SURGERY     ORIF TIBIAL SHAFT FRACTURE W/ PLATES AND SCREWS Left  PARTIAL KNEE ARTHROPLASTY Bilateral    SMALL INTESTINE SURGERY     Adenocarcinoma   TONSILLECTOMY     Family History  Problem Relation Age of Onset   Cancer Mother        Ovarian   Diabetes Mother    Heart disease Mother    Heart disease Father    Stroke Father    Diabetes Father    Diabetes Sister    Diabetes Brother    Diabetes  Brother    Cancer Paternal Grandfather        Prostate    Outpatient Medications Prior to Visit  Medication Sig Dispense Refill   acetaminophen (TYLENOL) 325 MG tablet Take 325 mg by mouth every 6 (six) hours as needed for mild pain or headache.     Alpha-Lipoic Acid 100 MG CAPS Take 1 capsule by mouth in the morning and at bedtime.     carvedilol (COREG) 3.125 MG tablet TAKE 1 TABLET BY MOUTH 2 TIMES A DAY WITH A MEAL 180 tablet 3   Cyanocobalamin (VITAMIN B 12 PO) Take by mouth daily.     ezetimibe (ZETIA) 10 MG tablet Take 10 mg by mouth daily.     fluorouracil (EFUDEX) 5 % cream 1 application     furosemide (LASIX) 20 MG tablet TAKE 1 TABLET (20 MG) BY MOUTH TWICE DAILY 180 tablet 3   gabapentin (NEURONTIN) 300 MG capsule Take 300 mg by mouth 2 (two) times daily.     glucose blood test strip OneTouch Ultra Blue Test Strip     hydrocortisone 2.5 % ointment Apply topically.     JANUMET XR [phone] MG TB24 Take 1 tablet by mouth every morning.     Lancets (ONETOUCH DELICA PLUS [id number]) MISC OneTouch Delica Plus Lancet 33 gauge     meclizine (ANTIVERT) 25 MG tablet Take 1 tablet (25 mg total) by mouth 3 (three) times daily as needed for dizziness. 30 tablet 0   melatonin 3 MG TABS tablet Take 3 mg by mouth at bedtime as needed (sleep).     Multiple Vitamin (MULTI-VITAMINS PO) Take 1 tablet by mouth daily.     nitroGLYCERIN (NITROSTAT) 0.4 MG SL tablet PLACE 1 TABLET UNDER TONGUE EVERY 5 MINUTES AS NEEDED FOR CHEST PAIN (UP TO 3 DOSES) 25 tablet 11   polyvinyl alcohol (LIQUIFILM TEARS) 1.4 % ophthalmic solution Place 1 drop into both eyes as needed for dry eyes.     PRADAXA 150 MG CAPS capsule TAKE 1 CAPSULE BY MOUTH 2 TIMES DAILY 180 capsule 2   REPATHA SURECLICK [id number] MG/ML SOAJ Inject 140 mg into the skin every 14 (fourteen) days.     Turmeric 400 MG CAPS See admin instructions.     UNABLE TO FIND Take 1 capsule by mouth in the morning and at bedtime. CURALIN     metFORMIN  (GLUCOPHAGE-XR) 500 MG 24 hr tablet Take by mouth.     No facility-administered medications prior to visit.    Allergies  Allergen Reactions   Atorvastatin Other (See Comments)    Dizziness.   Sulfa Antibiotics    Ace Inhibitors Rash   Amoxicillin Rash   Meperidine Rash   Meperidine Hcl Rash   Naproxen Sodium Rash   Sulfamethoxazole Rash   Tetracycline Rash     Objective:   Today's Vitals   12/08/20 0923  BP: 132/70  Pulse: 66  Temp: (!) 97.1 F (36.2 C)  TempSrc: Temporal  SpO2: 95%  Weight: 269 lb (122 kg)  Body mass index is 38.6 kg/m.   General: Well developed, well nourished. No acute distress. Lungs: Clear to auscultation bilaterally. No wheezing, rales or rhonchi. Extremities: Pressure stockings in place. Psych: Alert and oriented. Normal mood and affect.  Health Maintenance Due  Topic Date Due   Zoster Vaccines- Shingrix (1 of 2) Never done   TETANUS/TDAP  12/08/2018   INFLUENZA VACCINE  11/06/2020   COVID-19 Vaccine (5 - Booster for Pfizer series) 12/01/2020   Lab Results Lab Results  Component Value Date   HGBA1C 7.7 (H) 11/09/2020   Lab Results  Component Value Date   CHOL 82 11/09/2020   HDL 34.30 (L) 11/09/2020   LDLCALC 16 11/09/2020   TRIG 157.0 (H) 11/09/2020   CHOLHDL 2 11/09/2020   BMP Latest Ref Rng & Units 11/09/2020 12/17/2019 12/16/2019  Glucose 70 - 99 mg/dL 148(H) 140(H) 141(H)  BUN 6 - 23 mg/dL 28(H) 23 33(H)  Creatinine 0.40 - 1.50 mg/dL 1.30 1.26(H) 1.48(H)  BUN/Creat Ratio 10 - 24 - - -  Sodium 135 - 145 mEq/L 136 138 140  Potassium 3.5 - 5.1 mEq/L 4.1 3.7 3.9  Chloride 96 - 112 mEq/L 99 96(L) 98  CO2 19 - 32 mEq/L '26 30 28  '$ Calcium 8.4 - 10.5 mg/dL 8.7 8.4(L) 8.6(L)     Assessment & Plan:   1. Type 2 diabetes mellitus with stage 3a chronic kidney disease, without long-term current use of insulin ([place]) To counter the stopping of his empagliflozin, I will have him increase his afternoon dose of metformin XR to 1000 mg. He  will continue his morning Janumet 100/1000. I will reassess his A1c in 3 months.  - metFORMIN (GLUMETZA) 1000 MG (MOD) 24 hr tablet; Take 1 tablet (1,000 mg total) by mouth daily with breakfast.  Dispense: 90 tablet; Refill: 3  2. Chronic diastolic heart failure [facility]) Although Mr. Venner' weight is increased, there is no sign that he is retaining more fluids. We discussed him being as active as he can be, safely. We will continue his current Lasix and we will monitor this.  Haydee Salter, MD

## 2020-12-29 ENCOUNTER — Telehealth: Payer: Self-pay | Admitting: Family Medicine

## 2020-12-29 DIAGNOSIS — E782 Mixed hyperlipidemia: Secondary | ICD-10-CM

## 2020-12-29 MED ORDER — REPATHA SURECLICK 140 MG/ML ~~LOC~~ SOAJ: 140.0000 mg | SUBCUTANEOUS | 6 refills | Status: DC

## 2020-12-31 NOTE — Progress Notes (Signed)
Cardiology Office Note:    Date:  01/01/2021   ID:  Shawn Blanchard, DOB 1935-10-05, MRN [id number]  PCP:  Haydee Salter, MD  Cardiologist:  Shirlee More, MD    Referring MD: Dr. Arlester Marker    ASSESSMENT:    1. Orthostatic hypotension   2. Coronary artery disease involving native coronary artery of native heart with angina pectoris ([place])   3. Hypertensive heart disease with heart failure ([place])   4. Chronic atrial fibrillation (HCC)   5. Chronic anticoagulation   6. Dyslipidemia   7. Statin intolerance   8. Mixed hyperlipidemia    PLAN:    In order of problems listed above:  Improved he has approximately 30 mm drop standing related to his medications and diabetic neuropathy asymptomatic and I asked him and his wife to check blood pressures at home and record sitting and standing and avoid SGLT2 inhibitor Stable CAD no anginal discomfort continue medical treatment including his beta-blocker and lipid-lowering with PCSK9 inhibitor I renewed his Repatha today Stable standing blood pressure target continue current treatment including his low-dose loop diuretic beta-blocker. Stable rate controlled atrial fibrillation continue beta-blocker and anticoagulant Pradaxa Lipids at target continue nonstatin Zetia plus Repatha he is statin intolerant.   Next appointment: 1 year   Medication Adjustments/Labs and Tests Ordered: Current medicines are reviewed at length with the patient today.  Concerns regarding medicines are outlined above.  No orders of the defined types were placed in this encounter.  Meds ordered this encounter  Medications   REPATHA SURECLICK 539 MG/ML SOAJ    Sig: Inject 140 mg into the skin every 14 (fourteen) days.    Dispense:  2 mL    Refill:  6     Follow-up for orthostatic hypotension   History of Present Illness:    Shawn Blanchard is a 85 y.o. male with a hx of CAD with PCI of the LAD in 2002 hypertensive heart disease with heart failure  and mildly reduced ejection fraction 47% chronic atrial fibrillation anticoagulated with Pradaxa PAD CKD symptomatic orthostatic hypotension with an SGLT2 inhibitor and hyperlipidemia with statin intolerance treated with PCSK9 inhibitor last seen 08/16/2020.Echocardiogram 10/15/2019 showed ejection fraction 45 to 50% mild concentric LVH mild right ventricular dysfunction moderate mitral annular calcification with trivial regurgitation mild aortic regurgitation without findings of aortic stenosis   Compliance with diet, lifestyle and medications: Yes His wife is present participates in evaluation decision making. He has had no further orthostasis or falls and goes to the gym and exercises 5 days a week.  He is using both a cane and walker for safety. He is not checking his standing blood pressure at home. He has had no angina edema palpitations syncope or bleeding from his anticoagulant  Following his last visit he utilized a 7-day event monitor which showed atrial fibrillation continuously the rate was well controlled occasional ventricular ectopy was present and there were no pauses of 3 seconds or greater or significant bradycardia. 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   Arthritis 03/12/2016   Bilateral hand pain 03/25/2019   Blepharitis of both upper and lower eyelid 10/02/2018   Carpal tunnel syndrome of right wrist 03/25/2019   Chronic anticoagulation 10/11/2016   Chronic atrial fibrillation ([place]) 06/12/2015   Overview:  Managed CARDS   Chronic diastolic heart failure ([place]) 09/01/2015   Coronary artery disease involving native coronary  artery of native heart with angina pectoris ([place]) 02/15/2015   Overview:  Hx of MI with stent to LAD 2002 MPS 2016 with EF 47% and no ischemia   Dermatochalasis of both upper eyelids 11/21/2016   Dermatochalasis of right upper eyelid 10/02/2018    Diabetic polyneuropathy associated with type 2 diabetes mellitus ([place]) 12/16/2018   2020   Dyslipidemia 11/07/2017   Essential hypertension 11/07/2017   Ganglion cyst 11/27/2015   Overview:  2017: dorsum right hand   Gastroesophageal reflux disease without esophagitis 06/12/2015   Hyperlipidemia 04/11/2016   Hypertensive heart disease with heart failure ([place]) 02/15/2015   Idiopathic chronic gout 05/17/2015   Insomnia 11/27/2015   Leukocytosis 04/11/2016   Lightheadedness 10/11/2016   Long term current use of oral hypoglycemic drug 10/02/2018   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   Male erectile disorder 06/17/2016   Meibomian gland dysfunction (MGD) of both eyes 10/02/2018   MI (myocardial infarction) ([place]) 02/15/2015   Overview:  2002   Obesity 04/11/2016   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 exacerbation ([place]) 06/12/2015   Obstructive sleep apnea 08/30/2015   Overview:  CPAP   Old MI (myocardial infarction)    Orthostatic dizziness 09/15/2017   2018: med related   Permanent atrial fibrillation ([place]) 06/12/2015   Overview:  Managed CARDS   Pseudophakia of both eyes 03/12/2016   Pulmonary hypertension ([place]) 08/30/2015   Overview:  Managed CARDS   Purulent inflammation of skin 12/29/2018   Restless leg syndrome 08/30/2015   Restrictive lung disease 12/26/2016   Overview:  2018: mod on spirometry   SBO (small bowel obstruction) ([place]) 12/14/2019   Senile nuclear sclerosis 05/02/2016   Small bowel obstruction (HCC) 04/11/2016   SOB (shortness of breath) 08/14/2015   Type 2 diabetes mellitus with neurologic complication, without long-term current use of insulin ([place]) 12/02/2014   Unilateral inguinal hernia without obstruction or gangrene 06/12/2015    Past Surgical History:  Procedure Laterality Date   ABDOMINAL ADHESION SURGERY     x 2   BLEPHAROPLASTY Bilateral    CARPAL TUNNEL RELEASE Right 2021   CATARACT EXTRACTION Bilateral    CERVICAL SPINE SURGERY     CHOLECYSTECTOMY     CORONARY ANGIOPLASTY WITH  STENT PLACEMENT  2000   NASAL SINUS SURGERY     ORIF TIBIAL SHAFT FRACTURE W/ PLATES AND SCREWS Left    PARTIAL KNEE ARTHROPLASTY Bilateral    SMALL INTESTINE SURGERY     Adenocarcinoma   TONSILLECTOMY      Current Medications: Current Meds  Medication Sig   acetaminophen (TYLENOL) 325 MG tablet Take 325 mg by mouth every 6 (six) hours as needed for mild pain or headache.   Alpha-Lipoic Acid 100 MG CAPS Take 1 capsule by mouth in the morning and at bedtime.   carvedilol (COREG) 3.125 MG tablet TAKE 1 TABLET BY MOUTH 2 TIMES A DAY WITH A MEAL   Cyanocobalamin (VITAMIN B 12 PO) Take by mouth daily.   ezetimibe (ZETIA) 10 MG tablet Take 10 mg by mouth daily.   fluorouracil (EFUDEX) 5 % cream 1 application   furosemide (LASIX) 20 MG tablet TAKE 1 TABLET (20 MG) BY MOUTH TWICE DAILY   gabapentin (NEURONTIN) 300 MG capsule Take 300 mg by mouth 2 (two) times daily.   glucose blood test strip OneTouch Ultra Blue Test Strip   hydrocortisone 2.5 % ointment Apply topically.   JANUMET XR [phone] MG TB24 Take 1 tablet by mouth every morning.   Lancets [facility]  PLUS LANCET33G) MISC OneTouch Delica Plus Lancet 33 gauge   meclizine (ANTIVERT) 25 MG tablet Take 1 tablet (25 mg total) by mouth 3 (three) times daily as needed for dizziness.   melatonin 3 MG TABS tablet Take 3 mg by mouth at bedtime as needed (sleep).   metFORMIN (GLUMETZA) 1000 MG (MOD) 24 hr tablet Take 1 tablet (1,000 mg total) by mouth daily with breakfast.   Multiple Vitamin (MULTI-VITAMINS PO) Take 1 tablet by mouth daily.   nitroGLYCERIN (NITROSTAT) 0.4 MG SL tablet PLACE 1 TABLET UNDER TONGUE EVERY 5 MINUTES AS NEEDED FOR CHEST PAIN (UP TO 3 DOSES)   polyvinyl alcohol (LIQUIFILM TEARS) 1.4 % ophthalmic solution Place 1 drop into both eyes as needed for dry eyes.   PRADAXA 150 MG CAPS capsule TAKE 1 CAPSULE BY MOUTH 2 TIMES DAILY   Turmeric 400 MG CAPS See admin instructions.   UNABLE TO FIND Take 1 capsule by mouth in  the morning and at bedtime. CURALIN   [DISCONTINUED] REPATHA SURECLICK 782 MG/ML SOAJ Inject 140 mg into the skin every 14 (fourteen) days.     Allergies:   Atorvastatin, Jardiance [empagliflozin], Sulfa antibiotics, Ace inhibitors, Amoxicillin, Meperidine, Meperidine hcl, Naproxen sodium, Sulfamethoxazole, and Tetracycline   Social History   Socioeconomic History   Marital status: Married    Spouse name: Not on file   Number of children: 2   Years of education: Not on file   Highest education level: Not on file  Occupational History   Not on file  Tobacco Use   Smoking status: Never   Smokeless tobacco: Never  Vaping Use   Vaping Use: Never used  Substance and Sexual Activity   Alcohol use: Not Currently   Drug use: Never   Sexual activity: Not on file  Other Topics Concern   Not on file  Social History Narrative   Former high school principal.   Social Determinants of Health   Financial Resource Strain: Not on file  Food Insecurity: Not on file  Transportation Needs: Not on file  Physical Activity: Not on file  Stress: Not on file  Social Connections: Not on file     Family History: The patient's family history includes Cancer in his mother and paternal grandfather; Diabetes in his brother, brother, father, mother, and sister; Heart disease in his father and mother; Stroke in his father. ROS:   Please see the history of present illness.    All other systems reviewed and are negative.  EKGs/Labs/Other Studies Reviewed:    The following studies were reviewed today:   Recent Labs: 11/09/2020: BUN 28; Creatinine, Ser 1.30; Potassium 4.1; Sodium 136  Recent Lipid Panel    Component Value Date/Time   CHOL 82 11/09/2020 0757   TRIG 157.0 (H) 11/09/2020 0757   HDL 34.30 (L) 11/09/2020 0757   CHOLHDL 2 11/09/2020 0757   VLDL 31.4 11/09/2020 0757   LDLCALC 16 11/09/2020 0757    Physical Exam:    VS:  BP 126/60 (BP Location: Right Arm, Patient Position:  Standing)   Pulse 70   Ht 5\' 10"  (1.778 m)   Wt 267 lb (121.1 kg)   SpO2 97%   BMI 38.31 kg/m     Wt Readings from Last 3 Encounters:  01/01/21 267 lb (121.1 kg)  12/08/20 269 lb (122 kg)  11/08/20 261 lb 9.6 oz (118.7 kg)     GEN:  Well nourished, well developed in no acute distress HEENT: Normal NECK: No JVD; No carotid  bruits LYMPHATICS: No lymphadenopathy CARDIAC: Irregular rate and rhythm RRR, no murmurs, rubs, gallops RESPIRATORY:  Clear to auscultation without rales, wheezing or rhonchi  ABDOMEN: Soft, non-tender, non-distended MUSCULOSKELETAL:  No edema; No deformity  SKIN: Warm and dry NEUROLOGIC:  Alert and oriented x 3 PSYCHIATRIC:  Normal affect    Signed, Shirlee More, MD  01/01/2021 10:16 AM    [place]

## 2021-01-01 ENCOUNTER — Other Ambulatory Visit: Payer: Self-pay

## 2021-01-01 ENCOUNTER — Ambulatory Visit: Payer: Medicare PPO | Admitting: Cardiology

## 2021-01-01 ENCOUNTER — Telehealth: Payer: Self-pay | Admitting: Family Medicine

## 2021-01-01 VITALS — BP 126/60 | HR 70 | Ht 70.0 in | Wt 267.0 lb

## 2021-01-01 DIAGNOSIS — I951 Orthostatic hypotension: Secondary | ICD-10-CM

## 2021-01-01 DIAGNOSIS — Z7901 Long term (current) use of anticoagulants: Secondary | ICD-10-CM

## 2021-01-01 DIAGNOSIS — I110 Hypertensive heart disease with heart failure: Secondary | ICD-10-CM | POA: Diagnosis not present

## 2021-01-01 DIAGNOSIS — E782 Mixed hyperlipidemia: Secondary | ICD-10-CM

## 2021-01-01 DIAGNOSIS — I25119 Atherosclerotic heart disease of native coronary artery with unspecified angina pectoris: Secondary | ICD-10-CM | POA: Diagnosis not present

## 2021-01-01 DIAGNOSIS — I482 Chronic atrial fibrillation, unspecified: Secondary | ICD-10-CM | POA: Diagnosis not present

## 2021-01-01 DIAGNOSIS — E785 Hyperlipidemia, unspecified: Secondary | ICD-10-CM

## 2021-01-01 DIAGNOSIS — Z23 Encounter for immunization: Secondary | ICD-10-CM

## 2021-01-01 DIAGNOSIS — Z789 Other specified health status: Secondary | ICD-10-CM

## 2021-01-01 MED ORDER — REPATHA SURECLICK 140 MG/ML ~~LOC~~ SOAJ: 140.0000 mg | SUBCUTANEOUS | 6 refills | Status: DC

## 2021-01-01 MED ORDER — ZOSTER VAC RECOMB ADJUVANTED 50 MCG/0.5ML IM SUSR: 0.5000 mL | Freq: Once | INTRAMUSCULAR | 0 refills | Status: AC

## 2021-01-01 NOTE — Telephone Encounter (Signed)
Patient notified VIA phone and no questions. Dm/cma

## 2021-01-01 NOTE — Patient Instructions (Signed)

## 2021-01-01 NOTE — Telephone Encounter (Signed)
Spoke to patient and he was told that he needs a RX for Shingles vaccine sent to [facility] in order to get.  Can you send? Please advise . Thanks .  Dm/cma

## 2021-01-02 NOTE — Telephone Encounter (Signed)
Patient notified VIA phone. Dm/cma  

## 2021-01-11 ENCOUNTER — Encounter: Payer: Self-pay | Admitting: Family Medicine

## 2021-01-11 DIAGNOSIS — E1142 Type 2 diabetes mellitus with diabetic polyneuropathy: Secondary | ICD-10-CM

## 2021-01-12 MED ORDER — GABAPENTIN 100 MG PO CAPS: ORAL_CAPSULE | ORAL | 0 refills | Status: DC

## 2021-01-17 NOTE — Addendum Note (Signed)
Addended by: Haydee Salter on: 01/17/2021 04:48 PM   Modules accepted: Orders

## 2021-02-12 ENCOUNTER — Other Ambulatory Visit: Payer: Self-pay | Admitting: Cardiology

## 2021-02-16 ENCOUNTER — Ambulatory Visit: Payer: Medicare PPO | Admitting: Family Medicine

## 2021-02-22 ENCOUNTER — Other Ambulatory Visit: Payer: Self-pay | Admitting: Family Medicine

## 2021-03-05 ENCOUNTER — Other Ambulatory Visit: Payer: Self-pay | Admitting: Cardiology

## 2021-03-05 NOTE — Telephone Encounter (Unsigned)
Rx refill sent to pharmacy. 

## 2021-03-07 ENCOUNTER — Other Ambulatory Visit: Payer: Self-pay

## 2021-03-07 ENCOUNTER — Ambulatory Visit (INDEPENDENT_AMBULATORY_CARE_PROVIDER_SITE_OTHER): Payer: Medicare PPO

## 2021-03-07 VITALS — BP 142/78 | HR 60 | Temp 96.9°F | Ht 71.0 in | Wt 260.0 lb

## 2021-03-07 DIAGNOSIS — Z0000 Encounter for general adult medical examination without abnormal findings: Secondary | ICD-10-CM | POA: Diagnosis not present

## 2021-03-07 NOTE — Progress Notes (Signed)
Subjective:   Shawn Blanchard is a 85 y.o. male who presents for an Subsequent Medicare Annual Wellness Visit.  Review of Systems     Cardiac Risk Factors include: advanced age (>93men, >74 women);diabetes mellitus;dyslipidemia;male gender;hypertension     Objective:    Today's Vitals   03/07/21 1424  BP: (!) 142/78  Pulse: 60  Temp: (!) 96.9 F (36.1 C)  SpO2: 95%  Weight: 260 lb (117.9 kg)  Height: 5\' 11"  (1.803 m)   Body mass index is 36.26 kg/m.  Advanced Directives 03/07/2021 12/15/2019 12/14/2019  Does Patient Have a Medical Advance Directive? Yes Yes Yes  Type of Paramedic of [place];Living will [place];Living will [place];Living will  Does patient want to make changes to medical advance directive? - - No - Patient declined  Copy of [place] in Chart? No - copy requested No - copy requested No - copy requested    Current Medications (verified) Outpatient Encounter Medications as of 03/07/2021  Medication Sig   acetaminophen (TYLENOL) 325 MG tablet Take 325 mg by mouth every 6 (six) hours as needed for mild pain or headache.   Alpha-Lipoic Acid 100 MG CAPS Take 1 capsule by mouth in the morning and at bedtime.   carvedilol (COREG) 3.125 MG tablet TAKE 1 TABLET BY MOUTH 2 TIMES A DAY WITH A MEAL   Cyanocobalamin (VITAMIN B 12 PO) Take by mouth daily.   dabigatran (PRADAXA) 150 MG CAPS capsule TAKE 1 CAPSULE BY MOUTH 2 TIMES DAILY   ezetimibe (ZETIA) 10 MG tablet Take 10 mg by mouth daily.   fluorouracil (EFUDEX) 5 % cream 1 application   furosemide (LASIX) 20 MG tablet TAKE 1 TABLET BY MOUTH 2 TIMES A DAY   glucose blood test strip OneTouch Ultra Blue Test Strip   hydrocortisone 2.5 % ointment Apply topically.   JANUMET XR [phone] MG TB24 Take 1 tablet by mouth every morning.   Lancets (ONETOUCH DELICA PLUS [id number]) MISC OneTouch Delica Plus Lancet 33 gauge   meclizine  (ANTIVERT) 25 MG tablet Take 1 tablet (25 mg total) by mouth 3 (three) times daily as needed for dizziness.   melatonin 3 MG TABS tablet Take 3 mg by mouth at bedtime as needed (sleep).   metFORMIN (GLUCOPHAGE-XR) 500 MG 24 hr tablet TAKE 2 TABLETS BY MOUTH EVERY DAY WITH DINNER (DIABETES)   metFORMIN (GLUMETZA) 1000 MG (MOD) 24 hr tablet Take 1 tablet (1,000 mg total) by mouth daily with breakfast.   Multiple Vitamin (MULTI-VITAMINS PO) Take 1 tablet by mouth daily.   nitroGLYCERIN (NITROSTAT) 0.4 MG SL tablet PLACE 1 TABLET UNDER TONGUE EVERY 5 MINUTES AS NEEDED FOR CHEST PAIN (UP TO 3 DOSES)   polyvinyl alcohol (LIQUIFILM TEARS) 1.4 % ophthalmic solution Place 1 drop into both eyes as needed for dry eyes.   REPATHA SURECLICK 500 MG/ML SOAJ Inject 140 mg into the skin every 14 (fourteen) days.   Turmeric 400 MG CAPS See admin instructions.   gabapentin (NEURONTIN) 100 MG capsule Take 2 capsules (200 mg total) by mouth 2 (two) times daily for 2 days, THEN 1 capsule (100 mg total) 2 (two) times daily for 2 days, THEN 1 capsule (100 mg total) at bedtime for 2 days. Then stop.Marland Kitchen   UNABLE TO FIND Take 1 capsule by mouth in the morning and at bedtime. CURALIN (Patient not taking: Reported on 03/07/2021)   No facility-administered encounter medications on file as of 03/07/2021.  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   Arthritis 03/12/2016   Bilateral hand pain 03/25/2019   Blepharitis of both upper and lower eyelid 10/02/2018   Carpal tunnel syndrome of right wrist 03/25/2019   Chronic anticoagulation 10/11/2016   Chronic atrial fibrillation ([place]) 06/12/2015   Overview:  Managed CARDS    Chronic diastolic heart failure ([place]) 09/01/2015   Coronary artery disease involving native coronary artery of native heart with angina pectoris ([place]) 02/15/2015   Overview:  Hx of MI with stent to LAD 2002 MPS 2016 with EF 47% and no ischemia   Dermatochalasis of both upper eyelids 11/21/2016   Dermatochalasis of right upper eyelid 10/02/2018   Diabetic polyneuropathy associated with type 2 diabetes mellitus ([place]) 12/16/2018   2020   Dyslipidemia 11/07/2017   Essential hypertension 11/07/2017   Ganglion cyst 11/27/2015   Overview:  2017: dorsum right hand   Gastroesophageal reflux disease without esophagitis 06/12/2015   Hyperlipidemia 04/11/2016   Hypertensive heart disease with heart failure ([place]) 02/15/2015   Idiopathic chronic gout 05/17/2015   Insomnia 11/27/2015   Leukocytosis 04/11/2016   Lightheadedness 10/11/2016   Long term current use of oral hypoglycemic drug 10/02/2018   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   Male erectile disorder 06/17/2016   Meibomian gland dysfunction (MGD) of both eyes 10/02/2018   MI (myocardial infarction) ([place]) 02/15/2015   Overview:  2002   Obesity 04/11/2016   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 exacerbation ([place]) 06/12/2015   Obstructive sleep apnea 08/30/2015   Overview:  CPAP   Old MI (myocardial infarction)    Orthostatic dizziness 09/15/2017   2018: med related   Permanent atrial fibrillation ([place]) 06/12/2015   Overview:  Managed CARDS   Pseudophakia of both eyes 03/12/2016   Pulmonary hypertension ([place]) 08/30/2015   Overview:  Managed CARDS   Purulent inflammation of skin 12/29/2018   Restless leg syndrome 08/30/2015   Restrictive lung disease 12/26/2016   Overview:  2018: mod on spirometry   SBO (small bowel obstruction) ([place]) 12/14/2019   Senile nuclear sclerosis 05/02/2016   Small bowel obstruction ([place]) 04/11/2016   SOB (shortness of breath) 08/14/2015   Type 2 diabetes mellitus with neurologic complication, without long-term current use of insulin ([place])  12/02/2014   Unilateral inguinal hernia without obstruction or gangrene 06/12/2015   Past Surgical History:  Procedure Laterality Date   ABDOMINAL ADHESION SURGERY     x 2   BLEPHAROPLASTY Bilateral    CARPAL TUNNEL RELEASE Right 2021   CATARACT EXTRACTION Bilateral    CERVICAL SPINE SURGERY     CHOLECYSTECTOMY     CORONARY ANGIOPLASTY WITH STENT PLACEMENT  2000   NASAL SINUS SURGERY     ORIF TIBIAL SHAFT FRACTURE W/ PLATES AND SCREWS Left    PARTIAL KNEE ARTHROPLASTY Bilateral    SMALL INTESTINE SURGERY     Adenocarcinoma   TONSILLECTOMY     Family History  Problem Relation Age of Onset   Cancer Mother        Ovarian   Diabetes Mother    Heart disease Mother    Heart disease Father    Stroke Father    Diabetes Father    Diabetes Sister    Diabetes Brother    Diabetes Brother  Cancer Paternal Grandfather        Prostate   Social History   Socioeconomic History   Marital status: Married    Spouse name: Not on file   Number of children: 2   Years of education: Not on file   Highest education level: Not on file  Occupational History   Not on file  Tobacco Use   Smoking status: Never   Smokeless tobacco: Never  Vaping Use   Vaping Use: Never used  Substance and Sexual Activity   Alcohol use: Not Currently   Drug use: Never   Sexual activity: Not on file  Other Topics Concern   Not on file  Social History Narrative   Former high school principal.   Social Determinants of Health   Financial Resource Strain: Low Risk    Difficulty of Paying Living Expenses: Not hard at all  Food Insecurity: No Food Insecurity   Worried About Charity fundraiser in the Last Year: Never true   Arboriculturist in the Last Year: Never true  Transportation Needs: No Transportation Needs   Lack of Transportation (Medical): No   Lack of Transportation (Non-Medical): No  Physical Activity: Insufficiently Active   Days of Exercise per Week: 3 days   Minutes of Exercise per  Session: 40 min  Stress: No Stress Concern Present   Feeling of Stress : Not at all  Social Connections: Socially Integrated   Frequency of Communication with Friends and Family: Twice a week   Frequency of Social Gatherings with Friends and Family: Twice a week   Attends Religious Services: More than 4 times per year   Active Member of [employer] or Organizations: Yes   Attends Music therapist: More than 4 times per year   Marital Status: Married    Tobacco Counseling Counseling given: Not Answered   Clinical Intake:  Pre-visit preparation completed: Yes  Pain : No/denies pain     Nutritional Risks: None Diabetes: Yes CBG done?: No Did pt. bring in CBG monitor from home?: No  How often do you need to have someone help you when you read instructions, pamphlets, or other written materials from your doctor or pharmacy?: 1 - Never What is the last grade level you completed in school?: phd  Diabetic?yes Nutrition Risk Assessment:  Has the patient had any N/V/D within the last 2 months?  No  Does the patient have any non-healing wounds?  No  Has the patient had any unintentional weight loss or weight gain?  No   Diabetes:  Is the patient diabetic?  Yes  If diabetic, was a CBG obtained today?  No  Did the patient bring in their glucometer from home?  No  How often do you monitor your CBG's? Daily .   Financial Strains and Diabetes Management:  Are you having any financial strains with the device, your supplies or your medication? No .  Does the patient want to be seen by Chronic Care Management for management of their diabetes?  No  Would the patient like to be referred to a Nutritionist or for Diabetic Management?  No   Diabetic Exams:  Diabetic Eye Exam: Completed 06/2020 Diabetic Foot Exam: Overdue, Pt has been advised about the importance in completing this exam. Pt is scheduled for diabetic foot exam on next office .   Interpreter Needed?:  No  Information entered by :: L.Sontee Desena,LPN   Activities of Daily Living In your present state of health, do you  have any difficulty performing the following activities: 03/07/2021  Hearing? Y  Comment has hearing aids  Vision? N  Difficulty concentrating or making decisions? N  Walking or climbing stairs? N  Dressing or bathing? N  Doing errands, shopping? N  Preparing Food and eating ? N  Using the Toilet? N  In the past six months, have you accidently leaked urine? N  Do you have problems with loss of bowel control? N  Managing your Medications? N  Managing your Finances? N  Housekeeping or managing your Housekeeping? N  Some recent data might be hidden   Patient Care Team: Haydee Salter, MD as PCP - General (Family Medicine) Richardo Priest, MD as Consulting Physician (Cardiology) Nwobu, Lyman Bishop, MD as Consulting Physician (Nephrology)  Indicate any recent Medical Services you may have received from other than [facility] providers in the past year (date may be approximate).     Assessment:   This is a routine wellness examination for Shawn Blanchard.  Hearing/Vision screen No results found.  Dietary issues and exercise activities discussed: Current Exercise Habits: Home exercise routine, Type of exercise: walking, Time (Minutes): 30, Frequency (Times/Week): 3, Weekly Exercise (Minutes/Week): 90, Intensity: Mild, Exercise limited by: orthopedic condition(s);neurologic condition(s)   Goals Addressed   None    Depression Screen PHQ 2/9 Scores 03/07/2021 03/07/2021 11/08/2020  PHQ - 2 Score 0 0 0    Fall Risk Fall Risk  03/07/2021 11/08/2020  Falls in the past year? 0 1  Number falls in past yr: 0 1  Injury with Fall? 0 1  Follow up Falls evaluation completed -    FALL RISK PREVENTION PERTAINING TO THE HOME:  Any stairs in or around the home? Yes  If so, are there any without handrails? No  Home free of loose throw rugs in walkways, pet beds, electrical cords, etc? Yes   Adequate lighting in your home to reduce risk of falls? Yes   ASSISTIVE DEVICES UTILIZED TO PREVENT FALLS:  Life alert? Yes  Use of a cane, walker or w/c? Yes  Grab bars in the bathroom? Yes  Shower chair or bench in shower? Yes  Elevated toilet seat or a handicapped toilet? Yes   TIMED UP AND GO:  Was the test performed? Yes .  Length of time to ambulate 10 feet: 13 sec.   Gait steady and fast with assistive device  Cognitive Function:  Normal cognitive status assessed by direct observation by this Nurse Health Advisor. No abnormalities found.        Immunizations Immunization History  Administered Date(s) Administered   Fluad Quad(high Dose 65+) 12/08/2020   Influenza, High Dose Seasonal PF 01/25/2016, 01/13/2017, 01/01/2018, 12/29/2018, 01/08/2020   Influenza-Unspecified 01/25/2016, 01/13/2017   PFIZER Comirnaty(Gray Top)Covid-19 Tri-Sucrose Vaccine 08/01/2020   PFIZER(Purple Top)SARS-COV-2 Vaccination 04/24/2019, 05/15/2019, 01/04/2020   Pneumococcal Conjugate-13 10/13/2013, 01/15/2017   Pneumococcal Polysaccharide-23 05/20/2005   Td 12/07/2008   Zoster, Live 01/02/2012    TDAP status: Up to date  Flu Vaccine status: Up to date  Pneumococcal vaccine status: Up to date  Covid-19 vaccine status: Completed vaccines  Qualifies for Shingles Vaccine? Yes   Zostavax completed No   Shingrix Completed?: No.    Education has been provided regarding the importance of this vaccine. Patient has been advised to call insurance company to determine out of pocket expense if they have not yet received this vaccine. Advised may also receive vaccine at local pharmacy or Health Dept. Verbalized acceptance and understanding.  Screening Tests Health  Maintenance  Topic Date Due   Zoster Vaccines- Shingrix (1 of 2) Never done   TETANUS/TDAP  12/08/2018   COVID-19 Vaccine (5 - Booster for Pfizer series) 09/26/2020   HEMOGLOBIN A1C  05/12/2021   OPHTHALMOLOGY EXAM  08/06/2021    FOOT EXAM  11/08/2021   URINE MICROALBUMIN  11/09/2021   Pneumonia Vaccine 64+ Years old  Completed   INFLUENZA VACCINE  Completed   HPV VACCINES  Aged Out    Health Maintenance  Health Maintenance Due  Topic Date Due   Zoster Vaccines- Shingrix (1 of 2) Never done   TETANUS/TDAP  12/08/2018   COVID-19 Vaccine (5 - Booster for Pfizer series) 09/26/2020    Colorectal cancer screening: No longer required.   Lung Cancer Screening: (Low Dose CT Chest recommended if Age 43-80 years, 30 pack-year currently smoking OR have quit w/in 15years.) does not qualify.   Lung Cancer Screening Referral: n/a  Additional Screening:  Hepatitis C Screening: does not qualify;   Vision Screening: Recommended annual ophthalmology exams for early detection of glaucoma and other disorders of the eye. Is the patient up to date with their annual eye exam?  Yes  Who is the provider or what is the name of the office in which the patient attends annual eye exams? [facility]  If pt is not established with a provider, would they like to be referred to a provider to establish care? No .   Dental Screening: Recommended annual dental exams for proper oral hygiene  Community Resource Referral / Chronic Care Management: CRR required this visit?  No   CCM required this visit?  No      Plan:     I have personally reviewed and noted the following in the patient's chart:   Medical and social history Use of alcohol, tobacco or illicit drugs  Current medications and supplements including opioid prescriptions. Patient is not currently taking opioid prescriptions. Functional ability and status Nutritional status Physical activity Advanced directives List of other physicians Hospitalizations, surgeries, and ER visits in previous 12 months Vitals Screenings to include cognitive, depression, and falls Referrals and appointments  In addition, I have reviewed and discussed with patient certain  preventive protocols, quality metrics, and best practice recommendations. A written personalized care plan for preventive services as well as general preventive health recommendations were provided to patient.     Randel Pigg, LPN   38/01/1750   Nurse Notes: none

## 2021-03-07 NOTE — Patient Instructions (Signed)
Shawn Blanchard , Thank you for taking time to come for your Medicare Wellness Visit. I appreciate your ongoing commitment to your health goals. Please review the following plan we discussed and let me know if I can assist you in the future.   Screening recommendations/referrals: Colonoscopy: no longer required  Recommended yearly ophthalmology/optometry visit for glaucoma screening and checkup Recommended yearly dental visit for hygiene and checkup  Vaccinations: Influenza vaccine: completed  Pneumococcal vaccine: completed  Tdap vaccine: due  Shingles vaccine: due 2nd dose   Advanced directives: none   Conditions/risks identified: none   Next appointment: none   Preventive Care 83 Years and Older, Male Preventive care refers to lifestyle choices and visits with your health care provider that can promote health and wellness. What does preventive care include? A yearly physical exam. This is also called an annual well check. Dental exams once or twice a year. Routine eye exams. Ask your health care provider how often you should have your eyes checked. Personal lifestyle choices, including: Daily care of your teeth and gums. Regular physical activity. Eating a healthy diet. Avoiding tobacco and drug use. Limiting alcohol use. Practicing safe sex. Taking low doses of aspirin every day. Taking vitamin and mineral supplements as recommended by your health care provider. What happens during an annual well check? The services and screenings done by your health care provider during your annual well check will depend on your age, overall health, lifestyle risk factors, and family history of disease. Counseling  Your health care provider may ask you questions about your: Alcohol use. Tobacco use. Drug use. Emotional well-being. Home and relationship well-being. Sexual activity. Eating habits. History of falls. Memory and ability to understand (cognition). Work and work  Statistician. Screening  You may have the following tests or measurements: Height, weight, and BMI. Blood pressure. Lipid and cholesterol levels. These may be checked every 5 years, or more frequently if you are over 20 years old. Skin check. Lung cancer screening. You may have this screening every year starting at age 83 if you have a 30-pack-year history of smoking and currently smoke or have quit within the past 15 years. Fecal occult blood test (FOBT) of the stool. You may have this test every year starting at age 83. Flexible sigmoidoscopy or colonoscopy. You may have a sigmoidoscopy every 5 years or a colonoscopy every 10 years starting at age 33. Prostate cancer screening. Recommendations will vary depending on your family history and other risks. Hepatitis C blood test. Hepatitis B blood test. Sexually transmitted disease (STD) testing. Diabetes screening. This is done by checking your blood sugar (glucose) after you have not eaten for a while (fasting). You may have this done every 1-3 years. Abdominal aortic aneurysm (AAA) screening. You may need this if you are a current or former smoker. Osteoporosis. You may be screened starting at age 34 if you are at high risk. Talk with your health care provider about your test results, treatment options, and if necessary, the need for more tests. Vaccines  Your health care provider may recommend certain vaccines, such as: Influenza vaccine. This is recommended every year. Tetanus, diphtheria, and acellular pertussis (Tdap, Td) vaccine. You may need a Td booster every 10 years. Zoster vaccine. You may need this after age 63. Pneumococcal 13-valent conjugate (PCV13) vaccine. One dose is recommended after age 32. Pneumococcal polysaccharide (PPSV23) vaccine. One dose is recommended after age 32. Talk to your health care provider about which screenings and vaccines you need and  how often you need them. This information is not intended to replace  advice given to you by your health care provider. Make sure you discuss any questions you have with your health care provider. Document Released: 04/21/2015 Document Revised: 12/13/2015 Document Reviewed: 01/24/2015 Elsevier Interactive Patient Education  2017 [place] Prevention in the Home Falls can cause injuries. They can happen to people of all ages. There are many things you can do to make your home safe and to help prevent falls. What can I do on the outside of my home? Regularly fix the edges of walkways and driveways and fix any cracks. Remove anything that might make you trip as you walk through a door, such as a raised step or threshold. Trim any bushes or trees on the path to your home. Use bright outdoor lighting. Clear any walking paths of anything that might make someone trip, such as rocks or tools. Regularly check to see if handrails are loose or broken. Make sure that both sides of any steps have handrails. Any raised decks and porches should have guardrails on the edges. Have any leaves, snow, or ice cleared regularly. Use sand or salt on walking paths during winter. Clean up any spills in your garage right away. This includes oil or grease spills. What can I do in the bathroom? Use night lights. Install grab bars by the toilet and in the tub and shower. Do not use towel bars as grab bars. Use non-skid mats or decals in the tub or shower. If you need to sit down in the shower, use a plastic, non-slip stool. Keep the floor dry. Clean up any water that spills on the floor as soon as it happens. Remove soap buildup in the tub or shower regularly. Attach bath mats securely with double-sided non-slip rug tape. Do not have throw rugs and other things on the floor that can make you trip. What can I do in the bedroom? Use night lights. Make sure that you have a light by your bed that is easy to reach. Do not use any sheets or blankets that are too big for your bed.  They should not hang down onto the floor. Have a firm chair that has side arms. You can use this for support while you get dressed. Do not have throw rugs and other things on the floor that can make you trip. What can I do in the kitchen? Clean up any spills right away. Avoid walking on wet floors. Keep items that you use a lot in easy-to-reach places. If you need to reach something above you, use a strong step stool that has a grab bar. Keep electrical cords out of the way. Do not use floor polish or wax that makes floors slippery. If you must use wax, use non-skid floor wax. Do not have throw rugs and other things on the floor that can make you trip. What can I do with my stairs? Do not leave any items on the stairs. Make sure that there are handrails on both sides of the stairs and use them. Fix handrails that are broken or loose. Make sure that handrails are as long as the stairways. Check any carpeting to make sure that it is firmly attached to the stairs. Fix any carpet that is loose or worn. Avoid having throw rugs at the top or bottom of the stairs. If you do have throw rugs, attach them to the floor with carpet tape. Make sure that you have  a light switch at the top of the stairs and the bottom of the stairs. If you do not have them, ask someone to add them for you. What else can I do to help prevent falls? Wear shoes that: Do not have high heels. Have rubber bottoms. Are comfortable and fit you well. Are closed at the toe. Do not wear sandals. If you use a stepladder: Make sure that it is fully opened. Do not climb a closed stepladder. Make sure that both sides of the stepladder are locked into place. Ask someone to hold it for you, if possible. Clearly mark and make sure that you can see: Any grab bars or handrails. First and last steps. Where the edge of each step is. Use tools that help you move around (mobility aids) if they are needed. These  include: Canes. Walkers. Scooters. Crutches. Turn on the lights when you go into a dark area. Replace any light bulbs as soon as they burn out. Set up your furniture so you have a clear path. Avoid moving your furniture around. If any of your floors are uneven, fix them. If there are any pets around you, be aware of where they are. Review your medicines with your doctor. Some medicines can make you feel dizzy. This can increase your chance of falling. Ask your doctor what other things that you can do to help prevent falls. This information is not intended to replace advice given to you by your health care provider. Make sure you discuss any questions you have with your health care provider. Document Released: 01/19/2009 Document Revised: 08/31/2015 Document Reviewed: 04/29/2014 Elsevier Interactive Patient Education  2017 [employer].

## 2021-03-08 ENCOUNTER — Other Ambulatory Visit: Payer: Self-pay

## 2021-03-09 ENCOUNTER — Ambulatory Visit: Payer: Medicare PPO | Admitting: Family Medicine

## 2021-03-09 VITALS — BP 142/68 | HR 66 | Temp 97.6°F | Ht 71.0 in | Wt 256.6 lb

## 2021-03-09 DIAGNOSIS — I10 Essential (primary) hypertension: Secondary | ICD-10-CM

## 2021-03-09 DIAGNOSIS — I5032 Chronic diastolic (congestive) heart failure: Secondary | ICD-10-CM

## 2021-03-09 DIAGNOSIS — E1140 Type 2 diabetes mellitus with diabetic neuropathy, unspecified: Secondary | ICD-10-CM

## 2021-03-09 DIAGNOSIS — Z789 Other specified health status: Secondary | ICD-10-CM | POA: Insufficient documentation

## 2021-03-09 DIAGNOSIS — R42 Dizziness and giddiness: Secondary | ICD-10-CM | POA: Diagnosis not present

## 2021-03-09 DIAGNOSIS — N1831 Chronic kidney disease, stage 3a: Secondary | ICD-10-CM

## 2021-03-09 DIAGNOSIS — E782 Mixed hyperlipidemia: Secondary | ICD-10-CM

## 2021-03-09 HISTORY — DX: Other specified health status: Z78.9

## 2021-03-09 LAB — BASIC METABOLIC PANEL
BUN: 29 mg/dL — ABNORMAL HIGH (ref 6–23)
CO2: 24 mEq/L (ref 19–32)
Calcium: 9.3 mg/dL (ref 8.4–10.5)
Chloride: 102 mEq/L (ref 96–112)
Creatinine, Ser: 1.21 mg/dL (ref 0.40–1.50)
GFR: 55.51 mL/min — ABNORMAL LOW (ref >60.00)
Glucose, Bld: 135 mg/dL — ABNORMAL HIGH (ref 70–99)
Potassium: 4.2 mEq/L (ref 3.5–5.1)
Sodium: 136 mEq/L (ref 135–145)

## 2021-03-09 LAB — HEMOGLOBIN A1C: Hgb A1c MFr Bld: 7.5 % — ABNORMAL HIGH (ref 4.6–6.5)

## 2021-03-09 NOTE — Progress Notes (Signed)
[facility] [address] [place] [address] Dept: [phone] Dept Fax: [phone]  Chronic Care Office Visit  Subjective:    Patient ID: Shawn Blanchard, male    DOB: [birth year], 85 y.o..   MRN: [id number]  Chief Complaint  Patient presents with   Follow-up    3 month f/u.      History of Present Illness:  Patient is in today for reassessment of chronic medical issues.  Shawn Blanchard has an extensive history of CV disease. He had a prior heart attack and is s/p PTCA with stent placement. 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 He is currently managed on carvedilol and Lasix. He is also on Pradaxa for anticoagulation. He has hyperlipidemia, but is intolerant of statins, so is managed on ezetimibe and Repatha.   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 He is managed on Jaumet and metformin. His blood sugars at home average 140-160. He had been on Jardiance, but this was stopped due to orthostatic hypotension issues. He feels his orthostasis has improved, but not completely resolved. He finds that it doesn't hit as quick, so he has time to adjust and make sure he is safe. He notes he has not had any falls in the past 2 months, which is also an improvement. He is working with PT and this has helped.   Past Medical History: Patient Active Problem List   Diagnosis Date Noted   Peritoneal adhesion 11/08/2020   Peripheral venous insufficiency 11/08/2020   Type 2 diabetes mellitus with stage 3a chronic kidney disease, without long-term current use of insulin ([place]) 02/22/2020   Old MI (myocardial infarction)    Floppy eyelid syndrome of both eyes 10/05/2019   Sensorineural hearing loss (SNHL), bilateral 10/04/2019   Stage 3a chronic kidney disease ([place]) 06/13/2019   Carpal tunnel syndrome of right wrist 03/25/2019    Diabetic polyneuropathy associated with type 2 diabetes mellitus ([place]) 12/16/2018   Blepharitis of both upper and lower eyelid 10/02/2018   Dermatochalasis of right upper eyelid 10/02/2018   Meibomian gland dysfunction (MGD) of both eyes 10/02/2018   Essential hypertension 11/07/2017   Acute on chronic systolic congestive heart failure ([place]) 11/07/2017   Orthostatic dizziness 09/15/2017   Dermatochalasis of both upper eyelids 11/21/2016   Chronic anticoagulation 10/11/2016   Male erectile disorder 06/17/2016   Hyperlipidemia 04/11/2016   Small bowel obstruction ([place]) 04/11/2016   BMI 35.0-35.9,adult 04/11/2016   Arthritis 03/12/2016   Pseudophakia of both eyes 03/12/2016   Insomnia 11/27/2015   Ganglion cyst 11/27/2015   Chronic diastolic heart failure ([place]) 09/01/2015   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   Obstructive sleep apnea 08/30/2015   Pulmonary hypertension ([place]) 08/30/2015   Restless leg syndrome 08/30/2015   Chronic atrial fibrillation ([place]) 06/12/2015   Gastroesophageal reflux disease without esophagitis 06/12/2015   Idiopathic chronic gout 05/17/2015   Coronary artery disease involving native coronary artery of native heart with angina pectoris ([place]) 02/15/2015   Hypertensive heart disease with heart failure ([place]) 02/15/2015   Type 2 diabetes mellitus with neurologic complication, without long-term current use of insulin ([place]) 12/02/2014   Past Surgical History:  Procedure Laterality Date   ABDOMINAL ADHESION SURGERY     x 2   BLEPHAROPLASTY Bilateral    CARPAL TUNNEL RELEASE Right 2021   CATARACT EXTRACTION Bilateral  CERVICAL SPINE SURGERY     CHOLECYSTECTOMY     CORONARY ANGIOPLASTY WITH STENT PLACEMENT  2000   NASAL SINUS SURGERY     ORIF TIBIAL SHAFT FRACTURE W/ PLATES AND SCREWS Left    PARTIAL KNEE ARTHROPLASTY Bilateral    SMALL INTESTINE SURGERY     Adenocarcinoma   TONSILLECTOMY      Family History  Problem Relation Age of Onset   Cancer Mother        Ovarian   Diabetes Mother    Heart disease Mother    Heart disease Father    Stroke Father    Diabetes Father    Diabetes Sister    Diabetes Brother    Diabetes Brother    Cancer Paternal Grandfather        Prostate   Outpatient Medications Prior to Visit  Medication Sig Dispense Refill   Alpha-Lipoic Acid 100 MG CAPS Take 1 capsule by mouth in the morning and at bedtime.     carvedilol (COREG) 3.125 MG tablet TAKE 1 TABLET BY MOUTH 2 TIMES A DAY WITH A MEAL 180 tablet 3   Cyanocobalamin (VITAMIN B 12 PO) Take by mouth daily.     dabigatran (PRADAXA) 150 MG CAPS capsule TAKE 1 CAPSULE BY MOUTH 2 TIMES DAILY 180 capsule 2   ezetimibe (ZETIA) 10 MG tablet Take 10 mg by mouth daily.     furosemide (LASIX) 20 MG tablet TAKE 1 TABLET BY MOUTH 2 TIMES A DAY 180 tablet 3   glucose blood test strip OneTouch Ultra Blue Test Strip     hydrocortisone 2.5 % ointment Apply topically.     JANUMET XR [phone] MG TB24 Take 1 tablet by mouth every morning.     Lancets (ONETOUCH DELICA PLUS ZESPQZ30Q) MISC OneTouch Delica Plus Lancet 33 gauge     melatonin 3 MG TABS tablet Take 3 mg by mouth at bedtime as needed (sleep).     metFORMIN (GLUCOPHAGE-XR) 500 MG 24 hr tablet TAKE 2 TABLETS BY MOUTH EVERY DAY WITH DINNER (DIABETES) 180 tablet 3   Multiple Vitamin (MULTI-VITAMINS PO) Take 1 tablet by mouth daily.     nitroGLYCERIN (NITROSTAT) 0.4 MG SL tablet PLACE 1 TABLET UNDER TONGUE EVERY 5 MINUTES AS NEEDED FOR CHEST PAIN (UP TO 3 DOSES) 25 tablet 11   polyvinyl alcohol (LIQUIFILM TEARS) 1.4 % ophthalmic solution Place 1 drop into both eyes as needed for dry eyes.     REPATHA SURECLICK 762 MG/ML SOAJ Inject 140 mg into the skin every 14 (fourteen) days. 2 mL 6   Turmeric 400 MG CAPS See admin instructions.     acetaminophen (TYLENOL) 325 MG tablet Take 325 mg by mouth every 6 (six) hours as needed for mild pain or headache.  (Patient not taking: Reported on 03/09/2021)     fluorouracil (EFUDEX) 5 % cream 1 application (Patient not taking: Reported on 03/09/2021)     gabapentin (NEURONTIN) 100 MG capsule Take 2 capsules (200 mg total) by mouth 2 (two) times daily for 2 days, THEN 1 capsule (100 mg total) 2 (two) times daily for 2 days, THEN 1 capsule (100 mg total) at bedtime for 2 days. Then stop.. 14 capsule 0   meclizine (ANTIVERT) 25 MG tablet Take 1 tablet (25 mg total) by mouth 3 (three) times daily as needed for dizziness. (Patient not taking: Reported on 03/09/2021) 30 tablet 0   metFORMIN (GLUMETZA) 1000 MG (MOD) 24 hr tablet Take 1 tablet (1,000 mg total) by mouth  daily with breakfast. (Patient not taking: Reported on 03/09/2021) 90 tablet 3   UNABLE TO FIND Take 1 capsule by mouth in the morning and at bedtime. CURALIN (Patient not taking: Reported on 03/07/2021)     No facility-administered medications prior to visit.   Allergies  Allergen Reactions   Atorvastatin Other (See Comments)    Dizziness.   Jardiance [Empagliflozin] Other (See Comments)    Orthostasis   Sulfa Antibiotics    Ace Inhibitors Rash   Amoxicillin Rash   Meperidine Rash   Meperidine Hcl Rash   Naproxen Sodium Rash   Sulfamethoxazole Rash   Tetracycline Rash     Objective:   Today's Vitals   03/09/21 0820  BP: (!) 142/68  Pulse: 66  Temp: 97.6 F (36.4 C)  SpO2: 99%  Weight: 256 lb 9.6 oz (116.4 kg)  Height: 5\' 11"  (1.803 m)   Body mass index is 35.79 kg/m.   General: Well developed, well nourished. No acute distress. Lungs: Clear to auscultation bilaterally. No wheezing, rales or rhonchi. CV: Irregular rhythm with normal rate. Pulses 2+ bilaterally. Extremities: 2+ lower leg pitting edema. Psych: Alert and oriented. Normal mood and affect.  Health Maintenance Due  Topic Date Due   Zoster Vaccines- Shingrix (1 of 2) Never done   TETANUS/TDAP  12/08/2018   COVID-19 Vaccine (5 - Booster for Pfizer series)  09/26/2020   Lab Results  Last metabolic panel Lab Results  Component Value Date   GLUCOSE 148 (H) 11/09/2020   NA 136 11/09/2020   K 4.1 11/09/2020   CL 99 11/09/2020   CO2 26 11/09/2020   BUN 28 (H) 11/09/2020   CREATININE 1.30 11/09/2020   GFRNONAA 53 (L) 12/17/2019   CALCIUM 8.7 11/09/2020   PROT 8.0 12/14/2019   ALBUMIN 4.5 12/14/2019   BILITOT 1.6 (H) 12/14/2019   ALKPHOS 57 12/14/2019   AST 24 12/14/2019   ALT 21 12/14/2019   ANIONGAP 12 12/17/2019   Last lipids Lab Results  Component Value Date   CHOL 82 11/09/2020   HDL 34.30 (L) 11/09/2020   LDLCALC 16 11/09/2020   TRIG 157.0 (H) 11/09/2020   CHOLHDL 2 11/09/2020   Last hemoglobin A1c Lab Results  Component Value Date   HGBA1C 7.7 (H) 11/09/2020       Assessment & Plan:   1. Type 2 diabetes mellitus with diabetic neuropathy, without long-term current use of insulin (HCC) We are due to reassess the A1c today, esp. in light of stopping his Jardiance. Targeting an A1c between 7 and 8 seems prudent based on age and risk for hypoglycemia with lower A1cs.  - Hemoglobin A1c  2. Essential hypertension The systolic blood pressure is mildly high, but tolerable. He had not tolerated tighter blood pressure control due to orthostasis.  3. Orthostatic dizziness Improved off of SGLT2 inhibitor.  4. Chronic diastolic heart failure [facility]) Reviewed cardiology consult notes. Stable. Prior allergy to an ACE-I. With the history of orthostasis, he would likely be intolerant of adding an ARB. Continue carvedilol and furosemide.  5. Stage 3a chronic kidney disease ([place]) We will reassess renal function.  - Basic metabolic panel  6. Mixed hyperlipidemia LDL is quite low. On Repatha and Ezetimibe. Cardiology is following.  Haydee Salter, MD

## 2021-03-27 ENCOUNTER — Other Ambulatory Visit: Payer: Self-pay

## 2021-03-29 ENCOUNTER — Encounter: Payer: Self-pay | Admitting: Family Medicine

## 2021-03-29 ENCOUNTER — Other Ambulatory Visit: Payer: Self-pay

## 2021-03-29 ENCOUNTER — Ambulatory Visit: Payer: Medicare PPO | Admitting: Family Medicine

## 2021-03-29 VITALS — BP 138/66 | HR 57 | Temp 97.6°F | Ht 71.0 in | Wt 258.2 lb

## 2021-03-29 DIAGNOSIS — D509 Iron deficiency anemia, unspecified: Secondary | ICD-10-CM

## 2021-03-29 DIAGNOSIS — E1140 Type 2 diabetes mellitus with diabetic neuropathy, unspecified: Secondary | ICD-10-CM | POA: Diagnosis not present

## 2021-03-29 DIAGNOSIS — K529 Noninfective gastroenteritis and colitis, unspecified: Secondary | ICD-10-CM | POA: Diagnosis not present

## 2021-03-29 LAB — COMPREHENSIVE METABOLIC PANEL
ALT: 11 U/L (ref 0–53)
AST: 16 U/L (ref 0–37)
Albumin: 4.1 g/dL (ref 3.5–5.2)
Alkaline Phosphatase: 60 U/L (ref 39–117)
BUN: 22 mg/dL (ref 6–23)
CO2: 29 mEq/L (ref 19–32)
Calcium: 9.1 mg/dL (ref 8.4–10.5)
Chloride: 102 mEq/L (ref 96–112)
Creatinine, Ser: 1.25 mg/dL (ref 0.40–1.50)
GFR: 53.37 mL/min — ABNORMAL LOW (ref >60.00)
Glucose, Bld: 130 mg/dL — ABNORMAL HIGH (ref 70–99)
Potassium: 4.3 mEq/L (ref 3.5–5.1)
Sodium: 138 mEq/L (ref 135–145)
Total Bilirubin: 0.7 mg/dL (ref 0.2–1.2)
Total Protein: 7.1 g/dL (ref 6.0–8.3)

## 2021-03-29 LAB — TSH: TSH: 1.51 u[IU]/mL (ref 0.35–5.50)

## 2021-03-29 LAB — CBC
HCT: 38.9 % — ABNORMAL LOW (ref 39.0–52.0)
Hemoglobin: 12.5 g/dL — ABNORMAL LOW (ref 13.0–17.0)
MCHC: 32.2 g/dL (ref 30.0–36.0)
MCV: 82.8 fl (ref 78.0–100.0)
Platelets: 210 10*3/uL (ref 150.0–400.0)
RBC: 4.7 Mil/uL (ref 4.22–5.81)
RDW: 15.7 % — ABNORMAL HIGH (ref 11.5–15.5)
WBC: 7.4 10*3/uL (ref 4.0–10.5)

## 2021-03-29 LAB — SEDIMENTATION RATE: Sed Rate: 40 mm/hr — ABNORMAL HIGH (ref 0–20)

## 2021-03-29 LAB — FOLATE: Folate: 22.2 ng/mL (ref >5.9)

## 2021-03-29 LAB — T4, FREE: Free T4: 0.94 ng/dL (ref 0.60–1.60)

## 2021-03-29 LAB — MAGNESIUM: Magnesium: 1.8 mg/dL (ref 1.5–2.5)

## 2021-03-29 LAB — VITAMIN B12: Vitamin B-12: 616 pg/mL (ref 211–911)

## 2021-03-29 LAB — C-REACTIVE PROTEIN: CRP: 4 mg/dL (ref 0.5–20.0)

## 2021-03-29 LAB — VITAMIN D 25 HYDROXY (VIT D DEFICIENCY, FRACTURES): VITD: 37.03 ng/mL (ref 30.00–100.00)

## 2021-03-29 MED ORDER — METFORMIN HCL ER 500 MG PO TB24: 500.0000 mg | ORAL_TABLET | Freq: Every day | ORAL | 3 refills | Status: DC

## 2021-03-29 NOTE — Progress Notes (Signed)
[facility] [address] [name] [address] Dept: [phone] Dept Fax: [phone]  Office Visit  Subjective:    Patient ID: Shawn Blanchard, male    DOB: 08-24-1935, 85 y.o..   MRN: [id number]  Chief Complaint  Patient presents with   Diarrhea    Diarrhea x few months come and go.     History of Present Illness:  Patient is in today for evaluation of a 2-3 month history of frequent soft stools/diarrhea. He denies any associated fever. He has not seen blood in his stool. He takes loperamide and finds this clears the issue up for 2-3 days, but then it recurs. [name] has a history of Type 2 diabetes. He is managed on Janumet [phone] mg q am and metformin XR 500 mg, 2 tabs every afternoon [Note: his med list also included a metformin 1000 mg q pm, but he is not taking both]. 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 He denies any issues related to this prior surgery. The current diarrhea seemed to start after his metformin was increased.  Past Medical History: Patient Active Problem List   Diagnosis Date Noted   Statin intolerance 03/09/2021   Peritoneal adhesion 11/08/2020   Peripheral venous insufficiency 11/08/2020   Type 2 diabetes mellitus with stage 3a chronic kidney disease, without long-term current use of insulin ([place]) 02/22/2020   Old MI (myocardial infarction)    Floppy eyelid syndrome of both eyes 10/05/2019   Sensorineural hearing loss (SNHL), bilateral 10/04/2019   Stage 3a chronic kidney disease ([place]) 06/13/2019   Carpal tunnel syndrome of right wrist 03/25/2019   Diabetic polyneuropathy associated with type 2 diabetes mellitus ([place]) 12/16/2018   Blepharitis of both upper and lower eyelid 10/02/2018   Meibomian gland dysfunction (MGD) of both eyes 10/02/2018   Essential hypertension 11/07/2017   Acute on chronic systolic congestive  heart failure ([place]) 11/07/2017   Orthostatic dizziness 09/15/2017   Dermatochalasis of both upper eyelids 11/21/2016   Chronic anticoagulation 10/11/2016   Male erectile disorder 06/17/2016   Hyperlipidemia 04/11/2016   Small bowel obstruction ([place]) 04/11/2016   BMI 35.0-35.9,adult 04/11/2016   Arthritis 03/12/2016   Pseudophakia of both eyes 03/12/2016   Insomnia 11/27/2015   Ganglion cyst 11/27/2015   Chronic diastolic heart failure ([place]) 09/01/2015   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[place]) 08/30/2015   Obstructive sleep apnea 08/30/2015   Pulmonary hypertension ([place]) 08/30/2015   Restless leg syndrome 08/30/2015   Chronic atrial fibrillation ([place]) 06/12/2015   Gastroesophageal reflux disease without esophagitis 06/12/2015   Idiopathic chronic gout 05/17/2015   Coronary artery disease involving native coronary artery of native heart with angina pectoris ([place]) 02/15/2015   Hypertensive heart disease with heart failure ([place]) 02/15/2015   Type 2 diabetes mellitus with neurologic complication, without long-term current use of insulin ([place]) 12/02/2014   Past Surgical History:  Procedure Laterality Date   ABDOMINAL ADHESION SURGERY     x 2   BLEPHAROPLASTY Bilateral    CARPAL TUNNEL RELEASE Right 2021   CATARACT EXTRACTION Bilateral    CERVICAL SPINE SURGERY     CHOLECYSTECTOMY     CORONARY ANGIOPLASTY WITH STENT PLACEMENT  2000   NASAL SINUS SURGERY     ORIF TIBIAL SHAFT FRACTURE W/ PLATES AND SCREWS Left    PARTIAL KNEE ARTHROPLASTY Bilateral    SMALL INTESTINE  SURGERY     Adenocarcinoma   TONSILLECTOMY     Family History  Problem Relation Age of Onset   Cancer Mother        Ovarian   Diabetes Mother    Heart disease Mother    Heart disease Father    Stroke Father    Diabetes Father    Diabetes Sister    Diabetes Brother    Diabetes Brother    Cancer Paternal Grandfather        Prostate   Outpatient Medications Prior to  Visit  Medication Sig Dispense Refill   Alpha-Lipoic Acid 100 MG CAPS Take 1 capsule by mouth in the morning and at bedtime.     carvedilol (COREG) 3.125 MG tablet TAKE 1 TABLET BY MOUTH 2 TIMES A DAY WITH A MEAL 180 tablet 3   Cyanocobalamin (VITAMIN B 12 PO) Take by mouth daily.     dabigatran (PRADAXA) 150 MG CAPS capsule TAKE 1 CAPSULE BY MOUTH 2 TIMES DAILY 180 capsule 2   ezetimibe (ZETIA) 10 MG tablet Take 10 mg by mouth daily.     furosemide (LASIX) 20 MG tablet TAKE 1 TABLET BY MOUTH 2 TIMES A DAY 180 tablet 3   glucose blood test strip OneTouch Ultra Blue Test Strip     hydrocortisone 2.5 % ointment Apply topically.     JANUMET XR [phone] MG TB24 Take 1 tablet by mouth every morning.     Lancets (ONETOUCH DELICA PLUS [id number]) MISC OneTouch Delica Plus Lancet 33 gauge     melatonin 3 MG TABS tablet Take 3 mg by mouth at bedtime as needed (sleep).     Multiple Vitamin (MULTI-VITAMINS PO) Take 1 tablet by mouth daily.     nitroGLYCERIN (NITROSTAT) 0.4 MG SL tablet PLACE 1 TABLET UNDER TONGUE EVERY 5 MINUTES AS NEEDED FOR CHEST PAIN (UP TO 3 DOSES) 25 tablet 11   polyvinyl alcohol (LIQUIFILM TEARS) 1.4 % ophthalmic solution Place 1 drop into both eyes as needed for dry eyes.     REPATHA SURECLICK 852 MG/ML SOAJ Inject 140 mg into the skin every 14 (fourteen) days. 2 mL 6   Turmeric 400 MG CAPS See admin instructions.     metFORMIN (GLUCOPHAGE-XR) 500 MG 24 hr tablet TAKE 2 TABLETS BY MOUTH EVERY DAY WITH DINNER (DIABETES) 180 tablet 3   acetaminophen (TYLENOL) 325 MG tablet Take 325 mg by mouth every 6 (six) hours as needed for mild pain or headache. (Patient not taking: Reported on 03/09/2021)     fluorouracil (EFUDEX) 5 % cream 1 application (Patient not taking: Reported on 03/09/2021)     gabapentin (NEURONTIN) 100 MG capsule Take 2 capsules (200 mg total) by mouth 2 (two) times daily for 2 days, THEN 1 capsule (100 mg total) 2 (two) times daily for 2 days, THEN 1 capsule (100 mg  total) at bedtime for 2 days. Then stop.. 14 capsule 0   meclizine (ANTIVERT) 25 MG tablet Take 1 tablet (25 mg total) by mouth 3 (three) times daily as needed for dizziness. (Patient not taking: Reported on 03/09/2021) 30 tablet 0   metFORMIN (GLUMETZA) 1000 MG (MOD) 24 hr tablet Take 1 tablet (1,000 mg total) by mouth daily with breakfast. (Patient not taking: Reported on 03/29/2021) 90 tablet 3   UNABLE TO FIND Take 1 capsule by mouth in the morning and at bedtime. CURALIN (Patient not taking: Reported on 03/07/2021)     No facility-administered medications prior to visit.   Allergies  Allergen Reactions   Atorvastatin Other (See Comments)    Dizziness.   Jardiance [Empagliflozin] Other (See Comments)    Orthostasis   Sulfa Antibiotics    Ace Inhibitors Rash   Amoxicillin Rash   Meperidine Rash   Meperidine Hcl Rash   Naproxen Sodium Rash   Sulfamethoxazole Rash   Tetracycline Rash    Objective:   Today's Vitals   03/29/21 1009  BP: 138/66  Pulse: (!) 57  Temp: 97.6 F (36.4 C)  TempSrc: Temporal  SpO2: 96%  Weight: 258 lb 3.2 oz (117.1 kg)  Height: 5\' 11"  (1.803 m)   Body mass index is 36.01 kg/m.   General: Well developed, well nourished. No acute distress. Abdomen: Soft, non-tender. Bowel sounds positive, normal pitch and frequency. No   hepatosplenomegaly. No rebound or guarding. Psych: Alert and oriented. Normal mood and affect.  Health Maintenance Due  Topic Date Due   TETANUS/TDAP  12/08/2018     Assessment & Plan:   1. Chronic diarrhea of unknown origin I will conduct some blood and stool studies to look for potential etiologies of the chronic diarrhea. I do feel he can continue the Lomotil as needed for reducing his symptoms.  - CBC - Iron, TIBC and Ferritin Panel - Folate - Magnesium - Comprehensive metabolic panel - Vitamin Z02 - VITAMIN D 25 Hydroxy (Vit-D Deficiency, Fractures) - Ova and parasite examination - Clostridium difficile Toxin B,  Qualitative, Real-Time PCR([facility]) - TSH - T4, free - C-reactive protein - Sedimentation rate - Calprotectin-([facility])  2. Type 2 diabetes mellitus with diabetic neuropathy, without long-term current use of insulin (HCC) It is possible that the diarrhea is a side effect of his metformin. I will reduce his afternoon dose and see if this improves. I recommend he monitor his blood sugars closely. I will reassess him in 1 month.  - metFORMIN (GLUCOPHAGE-XR) 500 MG 24 hr tablet; Take 1 tablet (500 mg total) by mouth daily in the afternoon.  Dispense: 90 tablet; Refill: 3  Haydee Salter, MD

## 2021-03-30 ENCOUNTER — Other Ambulatory Visit: Payer: Medicare PPO

## 2021-03-30 ENCOUNTER — Encounter: Payer: Self-pay | Admitting: Family Medicine

## 2021-03-30 ENCOUNTER — Other Ambulatory Visit: Payer: Self-pay

## 2021-03-30 LAB — IRON,TIBC AND FERRITIN PANEL
%SAT: 12 % (calc) — ABNORMAL LOW (ref 20–48)
Ferritin: 62 ng/mL (ref 24–380)
Iron: 36 ug/dL — ABNORMAL LOW (ref 50–180)
TIBC: 299 mcg/dL (calc) (ref 250–425)

## 2021-03-30 MED ORDER — IRON (FERROUS SULFATE) 325 (65 FE) MG PO TABS
325.0000 mg | ORAL_TABLET | Freq: Two times a day (BID) | ORAL | 2 refills | Status: DC
Start: 2021-03-30 — End: 2021-07-02

## 2021-03-30 NOTE — Progress Notes (Signed)
Pt returned stool specimen collection to clinic.

## 2021-03-30 NOTE — Addendum Note (Signed)
Addended by: Haydee Salter on: 03/30/2021 08:09 AM   Modules accepted: Orders

## 2021-04-06 LAB — OVA AND PARASITE EXAMINATION
CONCENTRATE RESULT:: NONE SEEN
MICRO NUMBER:: 12795488
SPECIMEN QUALITY:: ADEQUATE
TRICHROME RESULT:: NONE SEEN

## 2021-04-06 LAB — CLOSTRIDIUM DIFFICILE TOXIN B, QUALITATIVE, REAL-TIME PCR: Toxigenic C. Difficile by PCR: NOT DETECTED

## 2021-04-06 LAB — CALPROTECTIN: Calprotectin: 88 mcg/g

## 2021-04-12 LAB — HM DIABETES EYE EXAM

## 2021-04-18 ENCOUNTER — Encounter: Payer: Self-pay | Admitting: Family Medicine

## 2021-04-18 DIAGNOSIS — Z86007 Personal history of in-situ neoplasm of skin: Secondary | ICD-10-CM

## 2021-04-18 HISTORY — DX: Personal history of in-situ neoplasm of skin: Z86.007

## 2021-04-27 ENCOUNTER — Encounter: Payer: Self-pay | Admitting: Family Medicine

## 2021-04-27 ENCOUNTER — Ambulatory Visit: Payer: Medicare PPO | Admitting: Family Medicine

## 2021-04-27 ENCOUNTER — Other Ambulatory Visit: Payer: Self-pay

## 2021-04-27 ENCOUNTER — Telehealth: Payer: Self-pay | Admitting: Family Medicine

## 2021-04-27 VITALS — BP 126/74 | HR 65 | Temp 97.9°F | Ht 71.0 in | Wt 256.6 lb

## 2021-04-27 DIAGNOSIS — E1140 Type 2 diabetes mellitus with diabetic neuropathy, unspecified: Secondary | ICD-10-CM | POA: Diagnosis not present

## 2021-04-27 DIAGNOSIS — K529 Noninfective gastroenteritis and colitis, unspecified: Secondary | ICD-10-CM | POA: Diagnosis not present

## 2021-04-27 DIAGNOSIS — D509 Iron deficiency anemia, unspecified: Secondary | ICD-10-CM

## 2021-04-27 DIAGNOSIS — S90822A Blister (nonthermal), left foot, initial encounter: Secondary | ICD-10-CM

## 2021-04-27 HISTORY — DX: Iron deficiency anemia, unspecified: D50.9

## 2021-04-27 NOTE — Telephone Encounter (Signed)
Spoke to patient and they were able to find the information/address to [facility]. No further questions. Dm/cma

## 2021-04-27 NOTE — Progress Notes (Signed)
[facility] [address] [place] [address] Dept: [phone] Dept Fax: [phone]  Office Visit  Subjective:    Patient ID: Shawn Blanchard, male    DOB: 02/06/1936, 86 y.o..   MRN: [id number]  Chief Complaint  Patient presents with   Follow-up    1 month f/u chronic diarrhea.  He reports its better but BS's are higher.   Average BS 160's    History of Present Illness:  Patient is in today for reassessment of his chronic diarrhea. At his last visit, we identified a 2-3 month history of frequent soft stools/diarrhea. I conducted a lab assessment and stool studies, but also had him reduce his metformin dose in the afternoons to 500 mg. He has noted a decrease in his diarhrea at this point. His lab evaluation did turn up mild iron-deficiency anemia.  Mr. Shawn Blanchard has an extensive history of CV disease. He had a prior heart attack and is s/p PTCA with stent placement. 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 He is currently managed on carvedilol and Lasix. He is also on Pradaxa for anticoagulation. He has hyperlipidemia, but is intolerant of statins, so is managed on ezetimibe and Repatha.   Mr. 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 He is managed on Janumet and metformin. He had been on Jardiance, but this was stopped due to orthostatic hypotension issues.   Mr. 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 While there, she lanced a "blood blister" on the sole of his left foot. He has had a new area develop right next to the previous blister.  Past Medical History: Patient Active Problem List   Diagnosis Date Noted   Iron deficiency anemia 04/27/2021   History of squamous cell carcinoma in situ 04/18/2021   Statin intolerance 03/09/2021   Peritoneal adhesion 11/08/2020    Peripheral venous insufficiency 11/08/2020   Type 2 diabetes mellitus with stage 3a chronic kidney disease, without long-term current use of insulin ([place]) 02/22/2020   Old MI (myocardial infarction)    Floppy eyelid syndrome of both eyes 10/05/2019   Sensorineural hearing loss (SNHL), bilateral 10/04/2019   Stage 3a chronic kidney disease ([place]) 06/13/2019   Carpal tunnel syndrome of right wrist 03/25/2019   Diabetic polyneuropathy associated with type 2 diabetes mellitus ([place]) 12/16/2018   Blepharitis of both upper and lower eyelid 10/02/2018   Meibomian gland dysfunction (MGD) of both eyes 10/02/2018   Essential hypertension 11/07/2017   Orthostatic dizziness 09/15/2017   Dermatochalasis of both upper eyelids 11/21/2016   Chronic anticoagulation 10/11/2016   Male erectile disorder 06/17/2016   Hyperlipidemia 04/11/2016   Small bowel obstruction ([place]) 04/11/2016   BMI 35.0-35.9,adult 04/11/2016   Arthritis 03/12/2016   Pseudophakia of both eyes 03/12/2016   Insomnia 11/27/2015   Ganglion cyst 11/27/2015   Chronic diastolic heart failure ([place]) 09/01/2015   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[place]) 08/30/2015   Obstructive sleep apnea 08/30/2015   Pulmonary hypertension ([place]) 08/30/2015   Restless leg syndrome 08/30/2015   Chronic atrial fibrillation ([place]) 06/12/2015   Gastroesophageal reflux disease without esophagitis 06/12/2015   Idiopathic chronic gout 05/17/2015   Coronary artery disease involving native coronary artery of native heart with angina pectoris ([place]) 02/15/2015   Hypertensive heart disease with heart failure ([place]) 02/15/2015   Type  2 diabetes mellitus with neurologic complication, without long-term current use of insulin ([place]) 12/02/2014   Past Surgical History:  Procedure Laterality Date   ABDOMINAL ADHESION SURGERY     x 2   BLEPHAROPLASTY Bilateral    CARPAL TUNNEL RELEASE Right 2021   CATARACT EXTRACTION Bilateral     CERVICAL SPINE SURGERY     CHOLECYSTECTOMY     CORONARY ANGIOPLASTY WITH STENT PLACEMENT  2000   NASAL SINUS SURGERY     ORIF TIBIAL SHAFT FRACTURE W/ PLATES AND SCREWS Left    PARTIAL KNEE ARTHROPLASTY Bilateral    SMALL INTESTINE SURGERY     Adenocarcinoma   TONSILLECTOMY     Family History  Problem Relation Age of Onset   Cancer Mother        Ovarian   Diabetes Mother    Heart disease Mother    Heart disease Father    Stroke Father    Diabetes Father    Diabetes Sister    Diabetes Brother    Diabetes Brother    Cancer Paternal Grandfather        Prostate   Outpatient Medications Prior to Visit  Medication Sig Dispense Refill   Alpha-Lipoic Acid 100 MG CAPS Take 1 capsule by mouth in the morning and at bedtime.     carvedilol (COREG) 3.125 MG tablet TAKE 1 TABLET BY MOUTH 2 TIMES A DAY WITH A MEAL 180 tablet 3   Cyanocobalamin (VITAMIN B 12 PO) Take by mouth daily.     dabigatran (PRADAXA) 150 MG CAPS capsule TAKE 1 CAPSULE BY MOUTH 2 TIMES DAILY 180 capsule 2   ezetimibe (ZETIA) 10 MG tablet Take 10 mg by mouth daily.     furosemide (LASIX) 20 MG tablet TAKE 1 TABLET BY MOUTH 2 TIMES A DAY 180 tablet 3   glucose blood test strip OneTouch Ultra Blue Test Strip     hydrocortisone 2.5 % ointment Apply topically.     Iron, Ferrous Sulfate, 325 (65 Fe) MG TABS Take 325 mg by mouth 2 (two) times daily. 60 tablet 2   JANUMET XR [phone] MG TB24 Take 1 tablet by mouth every morning.     Lancets (ONETOUCH DELICA PLUS [id number]) MISC OneTouch Delica Plus Lancet 33 gauge     melatonin 3 MG TABS tablet Take 3 mg by mouth at bedtime as needed (sleep).     metFORMIN (GLUCOPHAGE-XR) 500 MG 24 hr tablet Take 1 tablet (500 mg total) by mouth daily in the afternoon. 90 tablet 3   Multiple Vitamin (MULTI-VITAMINS PO) Take 1 tablet by mouth daily.     nitroGLYCERIN (NITROSTAT) 0.4 MG SL tablet PLACE 1 TABLET UNDER TONGUE EVERY 5 MINUTES AS NEEDED FOR CHEST PAIN (UP TO 3 DOSES) 25 tablet  11   polyvinyl alcohol (LIQUIFILM TEARS) 1.4 % ophthalmic solution Place 1 drop into both eyes as needed for dry eyes.     REPATHA SURECLICK 409 MG/ML SOAJ Inject 140 mg into the skin every 14 (fourteen) days. 2 mL 6   Turmeric 400 MG CAPS See admin instructions.     No facility-administered medications prior to visit.   Allergies  Allergen Reactions   Atorvastatin Other (See Comments)    Dizziness.   Jardiance [Empagliflozin] Other (See Comments)    Orthostasis   Sulfa Antibiotics    Ace Inhibitors Rash   Amoxicillin Rash   Meperidine Rash   Meperidine Hcl Rash   Naproxen Sodium Rash   Sulfamethoxazole Rash   Tetracycline Rash  Objective:   Today's Vitals   04/27/21 1127  BP: 126/74  Pulse: 65  Temp: 97.9 F (36.6 C)  TempSrc: Temporal  SpO2: 98%  Weight: 256 lb 9.6 oz (116.4 kg)  Height: 5\' 11"  (1.803 m)   Body mass index is 35.79 kg/m.   General: Well developed, well nourished. No acute distress. Foot: There is a 2 cm raised blister on the sole of the left foot. I was able to express a small amount of clear   fluid from this. Psych: Alert and oriented. Normal mood and affect.  Health Maintenance Due  Topic Date Due   TETANUS/TDAP  12/08/2018   Lab Results Last CBC Lab Results  Component Value Date   WBC 7.4 03/29/2021   HGB 12.5 (L) 03/29/2021   HCT 38.9 (L) 03/29/2021   MCV 82.8 03/29/2021   MCH 28.5 12/17/2019   RDW 15.7 (H) 03/29/2021   PLT 210.0 98/92/1194   Last metabolic panel Lab Results  Component Value Date   GLUCOSE 130 (H) 03/29/2021   NA 138 03/29/2021   K 4.3 03/29/2021   CL 102 03/29/2021   CO2 29 03/29/2021   BUN 22 03/29/2021   CREATININE 1.25 03/29/2021   GFRNONAA 53 (L) 12/17/2019   CALCIUM 9.1 03/29/2021   PROT 7.1 03/29/2021   ALBUMIN 4.1 03/29/2021   BILITOT 0.7 03/29/2021   ALKPHOS 60 03/29/2021   AST 16 03/29/2021   ALT 11 03/29/2021   ANIONGAP 12 12/17/2019   Last hemoglobin A1c Lab Results  Component Value  Date   HGBA1C 7.5 (H) 03/09/2021   Last thyroid functions Lab Results  Component Value Date   TSH 1.51 03/29/2021     Iron/TIBC/Ferritin/ %Sat    Component Value Date/Time   IRON 36 (L) 03/29/2021 1035   TIBC 299 03/29/2021 1035   FERRITIN 62 03/29/2021 1035   IRONPCTSAT 12 (L) 03/29/2021 1035   Assessment & Plan:   1. Chronic diarrhea of unknown origin I reviewed the multiple lab results we had obtained. His diarrhea has improved with the reduction in metformin, but his blood sugars are running a bit high. I will see him back as scheduled in about 6 weeks to reassess his A1c.  2. Type 2 diabetes mellitus with diabetic neuropathy, without long-term current use of insulin ([place]) As noted above.  - Ambulatory referral to Podiatry  3. Blister of right foot without infection, subsequent encounter The lesion ont he foot appears to be a simple blister. However, why he should develop this is not clear. I recommended he keep this clean and covered. I will refer him to podiatry for an assessment and to aid in management until this resolves.  - Ambulatory referral to Podiatry  4. Iron deficiency anemia, unspecified iron deficiency anemia type Mr. Magallon has an unexpected iron-deficiency anemia. I had prescribed iron supplementation.  He has a history of a small bowel lymphoma and resection of a portion of his bowel.  I will refer him to  GI to determine if there could be occult blood loss from a GI source.  - Ambulatory referral to Gastroenterology   Haydee Salter, MD

## 2021-04-30 ENCOUNTER — Encounter: Payer: Self-pay | Admitting: Podiatry

## 2021-04-30 ENCOUNTER — Ambulatory Visit (INDEPENDENT_AMBULATORY_CARE_PROVIDER_SITE_OTHER): Payer: Medicare PPO

## 2021-04-30 ENCOUNTER — Other Ambulatory Visit: Payer: Self-pay | Admitting: Podiatry

## 2021-04-30 ENCOUNTER — Other Ambulatory Visit: Payer: Self-pay

## 2021-04-30 ENCOUNTER — Ambulatory Visit: Payer: Medicare PPO | Admitting: Podiatry

## 2021-04-30 DIAGNOSIS — L97522 Non-pressure chronic ulcer of other part of left foot with fat layer exposed: Secondary | ICD-10-CM

## 2021-04-30 DIAGNOSIS — E11621 Type 2 diabetes mellitus with foot ulcer: Secondary | ICD-10-CM

## 2021-04-30 DIAGNOSIS — L089 Local infection of the skin and subcutaneous tissue, unspecified: Secondary | ICD-10-CM

## 2021-04-30 DIAGNOSIS — S90822A Blister (nonthermal), left foot, initial encounter: Secondary | ICD-10-CM

## 2021-04-30 MED ORDER — CLINDAMYCIN HCL 300 MG PO CAPS: 300.0000 mg | ORAL_CAPSULE | Freq: Three times a day (TID) | ORAL | 0 refills | Status: AC

## 2021-04-30 NOTE — Progress Notes (Signed)
Subjective:  Patient ID: Shawn Blanchard, male    DOB: 10/29/1935,   MRN: [id number]  No chief complaint on file.   86 y.o. male presents for concern of a blister on his left foot great toe. Relates it started about 2 weeks ago when the dermatologist noticed it and popped the blisters. Now relates it is looking purple. States he has been more active on it. Relates they have been keeping neosporin and Band-Aid on area.  He is diabetic and his last A1c was 7.5. Denies any other pedal complaints. Denies n/v/f/c.   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   Arthritis 03/12/2016   Bilateral hand pain 03/25/2019   Blepharitis of both upper and lower eyelid 10/02/2018   Carpal tunnel syndrome of right wrist 03/25/2019   Chronic anticoagulation 10/11/2016   Chronic atrial fibrillation ([place]) 06/12/2015   Overview:  Managed CARDS   Chronic diastolic heart failure ([place]) 09/01/2015   Coronary artery disease involving native coronary artery of native heart with angina pectoris ([place]) 02/15/2015   Overview:  Hx of MI with stent to LAD 2002 MPS 2016 with EF 47% and no ischemia   Dermatochalasis of both upper eyelids 11/21/2016   Dermatochalasis of right upper eyelid 10/02/2018   Diabetic polyneuropathy associated with type 2 diabetes mellitus ([place]) 12/16/2018   2020   Dyslipidemia 11/07/2017   Essential hypertension 11/07/2017   Ganglion cyst 11/27/2015   Overview:  2017: dorsum right hand   Gastroesophageal reflux disease without esophagitis 06/12/2015   Hyperlipidemia 04/11/2016   Hypertensive heart disease with heart failure ([place]) 02/15/2015   Idiopathic chronic gout 05/17/2015   Insomnia 11/27/2015   Leukocytosis 04/11/2016   Lightheadedness 10/11/2016   Long term current use of oral hypoglycemic drug 10/02/2018   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[place]) 08/30/2015   Male erectile  disorder 06/17/2016   Meibomian gland dysfunction (MGD) of both eyes 10/02/2018   MI (myocardial infarction) ([place]) 02/15/2015   Overview:  2002   Obesity 04/11/2016   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 exacerbation ([place]) 06/12/2015   Obstructive sleep apnea 08/30/2015   Overview:  CPAP   Old MI (myocardial infarction)    Orthostatic dizziness 09/15/2017   2018: med related   Permanent atrial fibrillation ([place]) 06/12/2015   Overview:  Managed CARDS   Pseudophakia of both eyes 03/12/2016   Pulmonary hypertension ([place]) 08/30/2015   Overview:  Managed CARDS   Purulent inflammation of skin 12/29/2018   Restless leg syndrome 08/30/2015   Restrictive lung disease 12/26/2016   Overview:  2018: mod on spirometry   SBO (small bowel obstruction) ([place]) 12/14/2019   Senile nuclear sclerosis 05/02/2016   Small bowel obstruction ([place]) 04/11/2016   SOB (shortness of breath) 08/14/2015   Type 2 diabetes mellitus with neurologic complication, without long-term current use of insulin ([place]) 12/02/2014   Unilateral inguinal hernia without obstruction or gangrene 06/12/2015    Objective:  Physical Exam: Vascular: DP/PT pulses 2/4 bilateral. CFT <3 seconds. Normal hair growth on digits. No edema.  Skin. No lacerations or abrasions bilateral feet.  Blister noted sub left first metatarsal. Upon debridement red purulent material was noted. Wound measuring 0.5 cm x1.5 cm x 0.2 cm was noted underlying the area with granular base and hyperkeratotic rim. No surrounding erythema or edema noted.  Musculoskeletal: MMT 5/5 bilateral lower extremities in DF, PF, Inversion and  Eversion. Deceased ROM in DF of ankle joint.  Neurological: Sensation intact to light touch.   Assessment:   1. Infected blister of left foot, initial encounter      Plan:  Patient was evaluated and treated and all questions answered. Ulcer right foot ulcer with fat layer exposed  -X-rays reviewed. Possible areas of erosion noted in first MPJ could be early  osteomyeliits vs cystic changes and degeneration.  -Debridement as below. -Dressed with betadine , DSD. -Off-loading with surgical shoe. -Doxycycline sent to pharmacy.  -MRI ordered  -ABI ordered.  -Discussed glucose control and proper protein-rich diet.  -Discussed if any worsening redness, pain, fever or chills to call or may need to report to the emergency room. Patient expressed understanding.   Procedure: Excisional Debridement of Wound Rationale: Removal of non-viable soft tissue from the wound to promote healing.  Anesthesia: none Pre-Debridement Wound Measurements: Overylying blister  Post-Debridement Wound Measurements: 0.5 cm x 1.5 cm x 0.2 cm  Type of Debridement: Sharp Excisional Tissue Removed: Non-viable soft tissue Depth of Debridement: subcutaneous tissue. Technique: Sharp excisional debridement to bleeding, viable wound base.  Dressing: Dry, sterile, compression dressing. Disposition: Patient tolerated procedure well. Patient to return in 2 week for follow-up.  Return in about 2 weeks (around 05/14/2021) for wound check.   Lorenda Peck, DPM

## 2021-05-01 ENCOUNTER — Ambulatory Visit
Admission: RE | Admit: 2021-05-01 | Discharge: 2021-05-01 | Disposition: A | Discharge status: Home / Self Care | Payer: Medicare PPO | Source: Ambulatory Visit | Attending: Podiatry | Admitting: Podiatry

## 2021-05-01 ENCOUNTER — Encounter: Payer: Self-pay | Admitting: Gastroenterology

## 2021-05-01 DIAGNOSIS — L97522 Non-pressure chronic ulcer of other part of left foot with fat layer exposed: Secondary | ICD-10-CM

## 2021-05-02 ENCOUNTER — Other Ambulatory Visit: Payer: Self-pay

## 2021-05-02 ENCOUNTER — Ambulatory Visit (HOSPITAL_COMMUNITY)
Admission: RE | Admit: 2021-05-02 | Discharge: 2021-05-02 | Disposition: A | Discharge status: Home / Self Care | Payer: Medicare PPO | Source: Ambulatory Visit | Attending: Podiatry | Admitting: Podiatry

## 2021-05-02 DIAGNOSIS — L97522 Non-pressure chronic ulcer of other part of left foot with fat layer exposed: Secondary | ICD-10-CM

## 2021-05-02 DIAGNOSIS — E11621 Type 2 diabetes mellitus with foot ulcer: Secondary | ICD-10-CM | POA: Diagnosis present

## 2021-05-07 ENCOUNTER — Other Ambulatory Visit: Payer: Self-pay | Admitting: Family Medicine

## 2021-05-14 ENCOUNTER — Other Ambulatory Visit: Payer: Self-pay

## 2021-05-14 ENCOUNTER — Encounter: Payer: Self-pay | Admitting: Podiatry

## 2021-05-14 ENCOUNTER — Ambulatory Visit: Payer: Medicare PPO | Admitting: Podiatry

## 2021-05-14 DIAGNOSIS — L84 Corns and callosities: Secondary | ICD-10-CM | POA: Diagnosis not present

## 2021-05-14 DIAGNOSIS — E1142 Type 2 diabetes mellitus with diabetic polyneuropathy: Secondary | ICD-10-CM

## 2021-05-14 NOTE — Progress Notes (Signed)
Subjective:  Patient ID: Shawn Blanchard, male    DOB: 05/20/1935,   MRN: [id number]  Chief Complaint  Patient presents with   Diabetic Ulcer    2 week follow up, left foot    86 y.o. male presents for concern of a blister on his left foot great toe. Relates it started about 2 weeks ago when the dermatologist noticed it and popped the blisters. Now relates it is looking purple. States he has been more active on it. Relates they have been keeping neosporin and Band-Aid on area.  He is diabetic and his last A1c was 7.5. Denies any other pedal complaints. Denies n/v/f/c.   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   Arthritis 03/12/2016   Bilateral hand pain 03/25/2019   Blepharitis of both upper and lower eyelid 10/02/2018   Carpal tunnel syndrome of right wrist 03/25/2019   Chronic anticoagulation 10/11/2016   Chronic atrial fibrillation ([place]) 06/12/2015   Overview:  Managed CARDS   Chronic diastolic heart failure ([place]) 09/01/2015   Coronary artery disease involving native coronary artery of native heart with angina pectoris ([place]) 02/15/2015   Overview:  Hx of MI with stent to LAD 2002 MPS 2016 with EF 47% and no ischemia   Dermatochalasis of both upper eyelids 11/21/2016   Dermatochalasis of right upper eyelid 10/02/2018   Diabetic polyneuropathy associated with type 2 diabetes mellitus ([place]) 12/16/2018   2020   Dyslipidemia 11/07/2017   Essential hypertension 11/07/2017   Ganglion cyst 11/27/2015   Overview:  2017: dorsum right hand   Gastroesophageal reflux disease without esophagitis 06/12/2015   Hyperlipidemia 04/11/2016   Hypertensive heart disease with heart failure ([place]) 02/15/2015   Idiopathic chronic gout 05/17/2015   Insomnia 11/27/2015   Leukocytosis 04/11/2016   Lightheadedness 10/11/2016   Long term current use of oral hypoglycemic drug 10/02/2018    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[place]) 08/30/2015   Male erectile disorder 06/17/2016   Meibomian gland dysfunction (MGD) of both eyes 10/02/2018   MI (myocardial infarction) ([place]) 02/15/2015   Overview:  2002   Obesity 04/11/2016   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 exacerbation ([place]) 06/12/2015   Obstructive sleep apnea 08/30/2015   Overview:  CPAP   Old MI (myocardial infarction)    Orthostatic dizziness 09/15/2017   2018: med related   Permanent atrial fibrillation ([place]) 06/12/2015   Overview:  Managed CARDS   Pseudophakia of both eyes 03/12/2016   Pulmonary hypertension ([place]) 08/30/2015   Overview:  Managed CARDS   Purulent inflammation of skin 12/29/2018   Restless leg syndrome 08/30/2015   Restrictive lung disease 12/26/2016   Overview:  2018: mod on spirometry   SBO (small bowel obstruction) ([place]) 12/14/2019   Senile nuclear sclerosis 05/02/2016   Small bowel obstruction ([place]) 04/11/2016   SOB (shortness of breath) 08/14/2015   Type 2 diabetes mellitus with neurologic complication, without long-term current use of insulin ([place]) 12/02/2014   Unilateral inguinal hernia without obstruction or gangrene 06/12/2015    Objective:  Physical Exam: Vascular: DP/PT pulses 2/4 bilateral. CFT <3 seconds. Normal hair growth on digits. No edema.  Skin. No lacerations or abrasions bilateral feet.  Blister noted sub left first metatarsal. Upon debridement red purulent material was noted. Wound measuring 0.5 cm x1.5 cm x 0.2 cm was noted underlying the area with granular base and hyperkeratotic rim. No surrounding erythema  or edema noted.  Musculoskeletal: MMT 5/5 bilateral lower extremities in DF, PF, Inversion and Eversion. Deceased ROM in DF of ankle joint.  Neurological: Sensation intact to light touch.   Assessment:   1. Diabetic polyneuropathy associated with type 2 diabetes mellitus ([place])   2. Pre-ulcerative calluses       Plan:  Patient was evaluated and treated and all questions answered. Ulcer  right foot ulcer with fat layer exposed - healed  -X-rays reviewed. MRI reviewed. No osteo noted. Some cellulitis which appears to be resolved today.  -Hyperkeratotic tissue overlying ulcer debrided without incident. Underlying ulcer healed.  -No abx necessary.  -Will send to get fitted for DM shoes and offloading of first metatarsal.  -Discussed glucose control and proper protein-rich diet.  -Discussed if any worsening redness, pain, fever or chills to call or may need to report to the emergency room. Patient expressed understanding.  Patient to follow-up in 3 months for routine foot care.    Return in about 3 months (around 08/11/2021) for rfc.   Lorenda Peck, DPM

## 2021-05-15 ENCOUNTER — Encounter: Payer: Self-pay | Admitting: Gastroenterology

## 2021-05-15 ENCOUNTER — Ambulatory Visit: Payer: Medicare PPO | Admitting: Gastroenterology

## 2021-05-15 VITALS — BP 128/68 | HR 56 | Ht 71.0 in | Wt 260.0 lb

## 2021-05-15 DIAGNOSIS — Z7901 Long term (current) use of anticoagulants: Secondary | ICD-10-CM

## 2021-05-15 DIAGNOSIS — D509 Iron deficiency anemia, unspecified: Secondary | ICD-10-CM | POA: Diagnosis not present

## 2021-05-15 DIAGNOSIS — K56609 Unspecified intestinal obstruction, unspecified as to partial versus complete obstruction: Secondary | ICD-10-CM

## 2021-05-15 DIAGNOSIS — I509 Heart failure, unspecified: Secondary | ICD-10-CM | POA: Diagnosis not present

## 2021-05-15 DIAGNOSIS — C8599 Non-Hodgkin lymphoma, unspecified, extranodal and solid organ sites: Secondary | ICD-10-CM

## 2021-05-15 DIAGNOSIS — I272 Pulmonary hypertension, unspecified: Secondary | ICD-10-CM | POA: Diagnosis not present

## 2021-05-15 DIAGNOSIS — N1831 Chronic kidney disease, stage 3a: Secondary | ICD-10-CM

## 2021-05-15 DIAGNOSIS — Z6836 Body mass index (BMI) 36.0-36.9, adult: Secondary | ICD-10-CM

## 2021-05-15 DIAGNOSIS — K529 Noninfective gastroenteritis and colitis, unspecified: Secondary | ICD-10-CM | POA: Diagnosis not present

## 2021-05-15 DIAGNOSIS — Z8719 Personal history of other diseases of the digestive system: Secondary | ICD-10-CM | POA: Diagnosis not present

## 2021-05-15 DIAGNOSIS — E1122 Type 2 diabetes mellitus with diabetic chronic kidney disease: Secondary | ICD-10-CM | POA: Diagnosis not present

## 2021-05-15 NOTE — Progress Notes (Signed)
Chief Complaint: Iron deficiency anemia, diarrhea  Referring Provider:     Haydee Salter, MD   HPI:     Shawn Blanchard is a 86 y.o. male with a history of CHF (TTE 12/2019 with EF 45-50%), atrial fibrillation (on Pradaxa), CAD s/p PCI 2002, diabetes with polyneuropathy, HTN, dyslipidemia, OSA (on CPAP), pulmonary hypertension, obesity (BMI 36), cholecystectomy, small bowel lymphoma s/p resection in 1990 (approximately 13 inches removed and then treated with chemotherapy), laparoscopy with LOA x2, referred to the [facility]astroenterology Clinic for evaluation of iron deficiency anemia and diarrhea.  Reports having loose, watery, nonbloody stools for the last 2 years or so.  Generally responsive to Imodium.  Was seen by his PCM, Dr. [name], for evaluation of chronic diarrhea on 03/29/2021 with evaluation as below: - H/H 12.5/39 with MCV/RDW 83/15.7.  Previously 15/46 with MCV/RDW 88/14 - Normal CMP - Ferritin 62, iron 36, TIBC 299, sat 12% - Normal vitamin D, B12, and folate - Normal TSH, T4 - Normal CRP - ESR 40 - Fecal calprotectin 88 (borderline) - Negative C. difficile, O&P  Was started on oral iron.   Today, he states stools have decreased in frequency and improved consistency since starting iron.  Otherwise no hematochezia or melena.  No nausea/vomiting.  Good p.o. intake.  No abdominal pain.  Was admitted in 12/2019 with SBO.  Was managed nonoperatively with NGT and bowel rest. -12/2019: CT AP: Hepatic steatosis, ccy.  Normal spleen and pancreas.  Previous small bowel resection with proximal small bowel distention similar to 04/2016.  Transition point in right mid abdomen c/w early vs pSBO.  Diverticulosis with abundant colonic stool.   Was last seen by Dr. [name] in the Cardiology Clinic on 07/03/2020.  Last EGD and colonoscopy were in the 1990's per patient.    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   Arthritis 03/12/2016   Bilateral hand pain 03/25/2019   Blepharitis of both upper and lower eyelid 10/02/2018   Carpal tunnel syndrome of right wrist 03/25/2019   Chronic anticoagulation 10/11/2016   Chronic atrial fibrillation ([place]) 06/12/2015   Overview:  Managed CARDS   Chronic diastolic heart failure ([place]) 09/01/2015   Coronary artery disease involving native coronary artery of native heart with angina pectoris ([place]) 02/15/2015   Overview:  Hx of MI with stent to LAD 2002 MPS 2016 with EF 47% and no ischemia   Dermatochalasis of both upper eyelids 11/21/2016   Dermatochalasis of right upper eyelid 10/02/2018   Diabetic polyneuropathy associated with type 2 diabetes mellitus ([place]) 12/16/2018   2020   Dyslipidemia 11/07/2017   Essential hypertension 11/07/2017   Ganglion cyst 11/27/2015   Overview:  2017: dorsum right hand   Gastroesophageal reflux disease without esophagitis 06/12/2015   Hyperlipidemia 04/11/2016   Hypertensive heart disease with heart failure ([place]) 02/15/2015   Idiopathic chronic gout 05/17/2015   Insomnia 11/27/2015   Leukocytosis 04/11/2016   Lightheadedness 10/11/2016   Long term current use of oral hypoglycemic drug 10/02/2018   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/30/2015   Male erectile disorder 06/17/2016   Meibomian gland dysfunction (MGD) of both eyes 10/02/2018   MI (myocardial infarction) ([place]) 02/15/2015   Overview:  2002   Obesity 04/11/2016   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 exacerbation ([place]) 06/12/2015   Obstructive sleep apnea 08/30/2015  Overview:  CPAP   Old MI (myocardial infarction)    Orthostatic dizziness 09/15/2017   2018: med related   Permanent atrial fibrillation ([place]) 06/12/2015   Overview:  Managed CARDS   Pseudophakia of both eyes 03/12/2016   Pulmonary hypertension ([place]) 08/30/2015   Overview:  Managed CARDS   Purulent inflammation of skin 12/29/2018   Restless leg syndrome 08/30/2015   Restrictive lung  disease 12/26/2016   Overview:  2018: mod on spirometry   SBO (small bowel obstruction) ([place]) 12/14/2019   Senile nuclear sclerosis 05/02/2016   Small bowel obstruction ([place]) 04/11/2016   SOB (shortness of breath) 08/14/2015   Type 2 diabetes mellitus with neurologic complication, without long-term current use of insulin ([place]) 12/02/2014   Unilateral inguinal hernia without obstruction or gangrene 06/12/2015     Past Surgical History:  Procedure Laterality Date   ABDOMINAL ADHESION SURGERY     x 2   BLEPHAROPLASTY Bilateral    CARPAL TUNNEL RELEASE Right 2021   CATARACT EXTRACTION Bilateral    CERVICAL SPINE SURGERY     CHOLECYSTECTOMY     CORONARY ANGIOPLASTY WITH STENT PLACEMENT  2000   NASAL SINUS SURGERY     ORIF TIBIAL SHAFT FRACTURE W/ PLATES AND SCREWS Left    PARTIAL KNEE ARTHROPLASTY Bilateral    SMALL INTESTINE SURGERY     Adenocarcinoma   TONSILLECTOMY     Family History  Problem Relation Age of Onset   Cancer Mother        Ovarian   Diabetes Mother    Heart disease Mother    Heart disease Father    Stroke Father    Diabetes Father    Diabetes Sister    Diabetes Brother    Diabetes Brother    Cancer Paternal Grandfather        Prostate   Colon cancer Neg Hx    Rectal cancer Neg Hx    Pancreatic cancer Neg Hx    Liver cancer Neg Hx    Stomach cancer Neg Hx    Social History   Tobacco Use   Smoking status: Never   Smokeless tobacco: Never  Vaping Use   Vaping Use: Never used  Substance Use Topics   Alcohol use: Not Currently   Drug use: Never   Current Outpatient Medications  Medication Sig Dispense Refill   Alpha-Lipoic Acid 100 MG CAPS Take 1 capsule by mouth in the morning and at bedtime.     carvedilol (COREG) 3.125 MG tablet TAKE 1 TABLET BY MOUTH 2 TIMES A DAY WITH A MEAL 180 tablet 3   Cyanocobalamin (VITAMIN B 12 PO) Take by mouth daily.     dabigatran (PRADAXA) 150 MG CAPS capsule TAKE 1 CAPSULE BY MOUTH 2 TIMES DAILY 180 capsule 2   ezetimibe  (ZETIA) 10 MG tablet Take 10 mg by mouth daily.     furosemide (LASIX) 20 MG tablet TAKE 1 TABLET BY MOUTH 2 TIMES A DAY 180 tablet 3   glucose blood test strip OneTouch Ultra Blue Test Strip     hydrocortisone 2.5 % ointment Apply topically.     Iron, Ferrous Sulfate, 325 (65 Fe) MG TABS Take 325 mg by mouth 2 (two) times daily. 60 tablet 2   JANUMET XR [phone] MG TB24 TAKE 1 TABLET BY MOUTH EVERY DAY WITH BREAKFAST (FOR DIABETES) 90 tablet 0   Lancets (ONETOUCH DELICA PLUS [id number]) MISC OneTouch Delica Plus Lancet 33 gauge     melatonin 3 MG TABS tablet Take  3 mg by mouth at bedtime as needed (sleep).     metFORMIN (GLUCOPHAGE-XR) 500 MG 24 hr tablet Take 1 tablet (500 mg total) by mouth daily in the afternoon. 90 tablet 3   Multiple Vitamin (MULTI-VITAMINS PO) Take 1 tablet by mouth daily.     nitroGLYCERIN (NITROSTAT) 0.4 MG SL tablet PLACE 1 TABLET UNDER TONGUE EVERY 5 MINUTES AS NEEDED FOR CHEST PAIN (UP TO 3 DOSES) 25 tablet 11   Polyethyl Glycol-Propyl Glycol 0.4-0.3 % SOLN Place 1 drop into both eyes as needed.     polyvinyl alcohol (LIQUIFILM TEARS) 1.4 % ophthalmic solution Place 1 drop into both eyes as needed for dry eyes.     REPATHA SURECLICK 169 MG/ML SOAJ Inject 140 mg into the skin every 14 (fourteen) days. 2 mL 6   Turmeric 400 MG CAPS See admin instructions.     No current facility-administered medications for this visit.   Allergies  Allergen Reactions   Atorvastatin Other (See Comments)    Dizziness.   Jardiance [Empagliflozin] Other (See Comments)    Orthostasis   Sulfa Antibiotics    Ace Inhibitors Rash   Amoxicillin Rash   Meperidine Rash   Meperidine Hcl Rash   Naproxen Sodium Rash   Sulfamethoxazole Rash   Tetracycline Rash     Review of Systems: All systems reviewed and negative except where noted in HPI.     Physical Exam:    Wt Readings from Last 3 Encounters:  05/15/21 260 lb (117.9 kg)  04/27/21 256 lb 9.6 oz (116.4 kg)  03/29/21 258 lb  3.2 oz (117.1 kg)    BP 128/68    Pulse (!) 56    Ht 5' 11"  (1.803 m)    Wt 260 lb (117.9 kg)    SpO2 98%    BMI 36.26 kg/m  Constitutional:  Pleasant, in no acute distress. Psychiatric: Normal mood and affect. Behavior is normal. Cardiovascular: Normal rate, regular rhythm. No edema Pulmonary/chest: Effort normal and breath sounds normal. No wheezing, rales or rhonchi. Abdominal: Soft, nondistended, nontender. Bowel sounds active throughout. There are no masses palpable. No hepatomegaly. Neurological: Alert and oriented to person place and time. Skin: Skin is warm and dry. No rashes noted.   ASSESSMENT AND PLAN;   1) Iron deficiency anemia Discussed potential GI etiologies of IDA at length with the patient and his wife today.  He is without overt GI blood loss, but clearly with IDA and declining MCV on recent lab work.  We discussed diagnostic and therapeutic options at length today.  Discussed elevated periprocedural risks due to underlying comorbidities and prior abdominal surgical history, but he strongly wishes to proceed with procedures.  - EGD and colonoscopy for diagnostic and potentially therapeutic intent - Will schedule procedures to be done at [facility] due to elevated periprocedural risks from underlying comorbidities, but also for anticipated potential need for APC therapy - Continue oral iron for now.  Hold 7 days prior to procedures - CT enterography to evaluate for small bowel pathology given history of small bowel lymphoma  2) History of small bowel lymphoma s/p small bowel resection 3) History of small bowel obstruction 4) Prior lysis of adhesions - Discussed elevated risks for retained needle capsule given prior small bowel history - Small bowel interrogation with CT enterography as above - Prior small bowel surgical history would make device assisted enteroscopy both challenging and increased risk  5) Chronic diarrhea - Check GI PCR panel, fecal pancreatic  elastase - Okay  to use Imodium prn - Maintain adequate hydration  6) History of CHF 7) Atrial fibrillation 8) Chronic anticoagulation 9) Hypertension 10) CAD with prior PCI 8) OSA 9) Pulmonary hypertension 10) Diabetes with neuropathy -Obtain Cardiology clearance to proceed with EGD/colonoscopy along with clearance to hold Pradaxa x2 days - As above, procedures will be scheduled at [facility] due to elevated periprocedural risks 2/2 underlying comorbidities, age, and body habitus - Discussed increased periprocedural risks related to his underlying comorbidities at length today and he wishes to proceed  The indications, risks, and benefits of EGD and colonoscopy were explained to the patient in detail. Risks include but are not limited to bleeding, perforation, adverse reaction to medications, and cardiopulmonary compromise. Sequelae include but are not limited to the possibility of surgery, hospitalization, and mortality. The patient verbalized understanding and wished to proceed. All questions answered, referred to scheduler and bowel prep ordered. Further recommendations pending results of the exam.     Lavena Bullion, DO, FACG  05/15/2021, 2:37 PM   [name], [name], MD

## 2021-05-15 NOTE — Patient Instructions (Signed)
If you are age 84 or older, your body mass index should be between 23-30. Your Body mass index is 36.26 kg/m. If this is out of the aforementioned range listed, please consider follow up with your Primary Care Provider.  If you are age 8 or younger, your body mass index should be between 19-25. Your Body mass index is 36.26 kg/m. If this is out of the aformentioned range listed, please consider follow up with your Primary Care Provider.   __________________________________________________________  The [facility] providers would like to encourage you to use [facility] to communicate with providers for non-urgent requests or questions.  Due to long hold times on the telephone, sending your provider a message by [facility] may be a faster and more efficient way to get a response.  Please allow 48 business hours for a response.  Please remember that this is for non-urgent requests.   Please go to the lab on the 2nd floor [address] before you leave the office today.   We will contact you to schedule the CT enterography and your Endoscopy and colonoscopy at [facility].  You will be contaced by our office prior to your procedure for directions on holding your Pradaxa.  If you do not hear from our office 1 week prior to your scheduled procedure, please call [phone] to discuss.  You will also need to hold your iron for 7 days prior to you EGD and colonoscopy.  Thank you for choosing me and [facility].  Vito Cirigliano, D.O.

## 2021-05-16 ENCOUNTER — Encounter (HOSPITAL_BASED_OUTPATIENT_CLINIC_OR_DEPARTMENT_OTHER): Payer: Self-pay

## 2021-05-16 ENCOUNTER — Other Ambulatory Visit: Payer: Self-pay

## 2021-05-16 ENCOUNTER — Encounter: Payer: Self-pay | Admitting: Gastroenterology

## 2021-05-16 ENCOUNTER — Inpatient Hospital Stay (HOSPITAL_BASED_OUTPATIENT_CLINIC_OR_DEPARTMENT_OTHER)
Admission: EM | Admit: 2021-05-16 | Discharge: 2021-05-22 | DRG: 389 | Disposition: A | Discharge status: Home / Self Care | Payer: Medicare PPO | Attending: Internal Medicine | Admitting: Internal Medicine

## 2021-05-16 ENCOUNTER — Emergency Department (HOSPITAL_BASED_OUTPATIENT_CLINIC_OR_DEPARTMENT_OTHER): Payer: Medicare PPO

## 2021-05-16 DIAGNOSIS — E1142 Type 2 diabetes mellitus with diabetic polyneuropathy: Secondary | ICD-10-CM | POA: Diagnosis present

## 2021-05-16 DIAGNOSIS — G2581 Restless legs syndrome: Secondary | ICD-10-CM | POA: Diagnosis present

## 2021-05-16 DIAGNOSIS — Z9221 Personal history of antineoplastic chemotherapy: Secondary | ICD-10-CM

## 2021-05-16 DIAGNOSIS — I25119 Atherosclerotic heart disease of native coronary artery with unspecified angina pectoris: Secondary | ICD-10-CM | POA: Diagnosis present

## 2021-05-16 DIAGNOSIS — E876 Hypokalemia: Secondary | ICD-10-CM | POA: Diagnosis present

## 2021-05-16 DIAGNOSIS — K219 Gastro-esophageal reflux disease without esophagitis: Secondary | ICD-10-CM | POA: Diagnosis present

## 2021-05-16 DIAGNOSIS — I160 Hypertensive urgency: Secondary | ICD-10-CM | POA: Diagnosis present

## 2021-05-16 DIAGNOSIS — I252 Old myocardial infarction: Secondary | ICD-10-CM | POA: Diagnosis not present

## 2021-05-16 DIAGNOSIS — Z79899 Other long term (current) drug therapy: Secondary | ICD-10-CM | POA: Diagnosis not present

## 2021-05-16 DIAGNOSIS — E1149 Type 2 diabetes mellitus with other diabetic neurological complication: Secondary | ICD-10-CM | POA: Diagnosis present

## 2021-05-16 DIAGNOSIS — D72829 Elevated white blood cell count, unspecified: Secondary | ICD-10-CM | POA: Diagnosis not present

## 2021-05-16 DIAGNOSIS — G4733 Obstructive sleep apnea (adult) (pediatric): Secondary | ICD-10-CM | POA: Diagnosis present

## 2021-05-16 DIAGNOSIS — D6869 Other thrombophilia: Secondary | ICD-10-CM | POA: Diagnosis not present

## 2021-05-16 DIAGNOSIS — Z7984 Long term (current) use of oral hypoglycemic drugs: Secondary | ICD-10-CM | POA: Diagnosis not present

## 2021-05-16 DIAGNOSIS — E1165 Type 2 diabetes mellitus with hyperglycemia: Secondary | ICD-10-CM | POA: Diagnosis not present

## 2021-05-16 DIAGNOSIS — Z823 Family history of stroke: Secondary | ICD-10-CM

## 2021-05-16 DIAGNOSIS — I110 Hypertensive heart disease with heart failure: Secondary | ICD-10-CM | POA: Diagnosis present

## 2021-05-16 DIAGNOSIS — D649 Anemia, unspecified: Secondary | ICD-10-CM | POA: Diagnosis not present

## 2021-05-16 DIAGNOSIS — Z7901 Long term (current) use of anticoagulants: Secondary | ICD-10-CM | POA: Diagnosis not present

## 2021-05-16 DIAGNOSIS — I482 Chronic atrial fibrillation, unspecified: Secondary | ICD-10-CM | POA: Diagnosis present

## 2021-05-16 DIAGNOSIS — E669 Obesity, unspecified: Secondary | ICD-10-CM | POA: Diagnosis present

## 2021-05-16 DIAGNOSIS — E1140 Type 2 diabetes mellitus with diabetic neuropathy, unspecified: Secondary | ICD-10-CM | POA: Diagnosis not present

## 2021-05-16 DIAGNOSIS — I2510 Atherosclerotic heart disease of native coronary artery without angina pectoris: Secondary | ICD-10-CM | POA: Diagnosis present

## 2021-05-16 DIAGNOSIS — I5022 Chronic systolic (congestive) heart failure: Secondary | ICD-10-CM | POA: Diagnosis present

## 2021-05-16 DIAGNOSIS — N390 Urinary tract infection, site not specified: Secondary | ICD-10-CM | POA: Diagnosis present

## 2021-05-16 DIAGNOSIS — R079 Chest pain, unspecified: Secondary | ICD-10-CM | POA: Diagnosis not present

## 2021-05-16 DIAGNOSIS — K5669 Other partial intestinal obstruction: Secondary | ICD-10-CM | POA: Diagnosis not present

## 2021-05-16 DIAGNOSIS — Z85068 Personal history of other malignant neoplasm of small intestine: Secondary | ICD-10-CM | POA: Diagnosis not present

## 2021-05-16 DIAGNOSIS — I4891 Unspecified atrial fibrillation: Secondary | ICD-10-CM | POA: Diagnosis not present

## 2021-05-16 DIAGNOSIS — I272 Pulmonary hypertension, unspecified: Secondary | ICD-10-CM | POA: Diagnosis present

## 2021-05-16 DIAGNOSIS — Z8572 Personal history of non-Hodgkin lymphomas: Secondary | ICD-10-CM | POA: Diagnosis not present

## 2021-05-16 DIAGNOSIS — I4821 Permanent atrial fibrillation: Secondary | ICD-10-CM | POA: Diagnosis present

## 2021-05-16 DIAGNOSIS — I10 Essential (primary) hypertension: Secondary | ICD-10-CM | POA: Diagnosis not present

## 2021-05-16 DIAGNOSIS — Z961 Presence of intraocular lens: Secondary | ICD-10-CM | POA: Diagnosis present

## 2021-05-16 DIAGNOSIS — K6389 Other specified diseases of intestine: Secondary | ICD-10-CM | POA: Diagnosis not present

## 2021-05-16 DIAGNOSIS — E785 Hyperlipidemia, unspecified: Secondary | ICD-10-CM | POA: Diagnosis present

## 2021-05-16 DIAGNOSIS — G4700 Insomnia, unspecified: Secondary | ICD-10-CM | POA: Diagnosis present

## 2021-05-16 DIAGNOSIS — Z8579 Personal history of other malignant neoplasms of lymphoid, hematopoietic and related tissues: Secondary | ICD-10-CM

## 2021-05-16 DIAGNOSIS — Z833 Family history of diabetes mellitus: Secondary | ICD-10-CM

## 2021-05-16 DIAGNOSIS — Z8249 Family history of ischemic heart disease and other diseases of the circulatory system: Secondary | ICD-10-CM

## 2021-05-16 DIAGNOSIS — Z809 Family history of malignant neoplasm, unspecified: Secondary | ICD-10-CM

## 2021-05-16 DIAGNOSIS — N3000 Acute cystitis without hematuria: Secondary | ICD-10-CM | POA: Diagnosis not present

## 2021-05-16 DIAGNOSIS — Z955 Presence of coronary angioplasty implant and graft: Secondary | ICD-10-CM | POA: Diagnosis not present

## 2021-05-16 DIAGNOSIS — K5939 Other megacolon: Secondary | ICD-10-CM | POA: Diagnosis not present

## 2021-05-16 DIAGNOSIS — R109 Unspecified abdominal pain: Secondary | ICD-10-CM | POA: Diagnosis not present

## 2021-05-16 DIAGNOSIS — Z6835 Body mass index (BMI) 35.0-35.9, adult: Secondary | ICD-10-CM | POA: Diagnosis not present

## 2021-05-16 DIAGNOSIS — Z882 Allergy status to sulfonamides status: Secondary | ICD-10-CM

## 2021-05-16 DIAGNOSIS — K56609 Unspecified intestinal obstruction, unspecified as to partial versus complete obstruction: Secondary | ICD-10-CM | POA: Diagnosis present

## 2021-05-16 DIAGNOSIS — I700 Atherosclerosis of aorta: Secondary | ICD-10-CM | POA: Diagnosis not present

## 2021-05-16 DIAGNOSIS — Z4682 Encounter for fitting and adjustment of non-vascular catheter: Secondary | ICD-10-CM | POA: Diagnosis not present

## 2021-05-16 DIAGNOSIS — I517 Cardiomegaly: Secondary | ICD-10-CM | POA: Diagnosis not present

## 2021-05-16 DIAGNOSIS — K5651 Intestinal adhesions [bands], with partial obstruction: Principal | ICD-10-CM | POA: Diagnosis present

## 2021-05-16 DIAGNOSIS — Z96653 Presence of artificial knee joint, bilateral: Secondary | ICD-10-CM | POA: Diagnosis present

## 2021-05-16 DIAGNOSIS — Z888 Allergy status to other drugs, medicaments and biological substances status: Secondary | ICD-10-CM

## 2021-05-16 LAB — URINALYSIS, ROUTINE W REFLEX MICROSCOPIC
Bilirubin Urine: NEGATIVE
Glucose, UA: NEGATIVE mg/dL
Hgb urine dipstick: NEGATIVE
Ketones, ur: NEGATIVE mg/dL
Nitrite: NEGATIVE
Protein, ur: 30 mg/dL — AB
Specific Gravity, Urine: 1.021 (ref 1.005–1.030)
pH: 5 (ref 5.0–8.0)

## 2021-05-16 LAB — COMPREHENSIVE METABOLIC PANEL
ALT: 11 U/L (ref 0–44)
AST: 16 U/L (ref 15–41)
Albumin: 4.5 g/dL (ref 3.5–5.0)
Alkaline Phosphatase: 48 U/L (ref 38–126)
Anion gap: 12 (ref 5–15)
BUN: 20 mg/dL (ref 8–23)
CO2: 24 mmol/L (ref 22–32)
Calcium: 9.9 mg/dL (ref 8.9–10.3)
Chloride: 102 mmol/L (ref 98–111)
Creatinine, Ser: 1.12 mg/dL (ref 0.61–1.24)
GFR, Estimated: >60 mL/min (ref >60)
Glucose, Bld: 170 mg/dL — ABNORMAL HIGH (ref 70–99)
Potassium: 4.5 mmol/L (ref 3.5–5.1)
Sodium: 138 mmol/L (ref 135–145)
Total Bilirubin: 0.9 mg/dL (ref 0.3–1.2)
Total Protein: 7.6 g/dL (ref 6.5–8.1)

## 2021-05-16 LAB — CBC
HCT: 40.3 % (ref 39.0–52.0)
Hemoglobin: 12.7 g/dL — ABNORMAL LOW (ref 13.0–17.0)
MCH: 26.3 pg (ref 26.0–34.0)
MCHC: 31.5 g/dL (ref 30.0–36.0)
MCV: 83.6 fL (ref 80.0–100.0)
Platelets: 232 K/uL (ref 150–400)
RBC: 4.82 MIL/uL (ref 4.22–5.81)
RDW: 15.4 % (ref 11.5–15.5)
WBC: 12.6 K/uL — ABNORMAL HIGH (ref 4.0–10.5)
nRBC: 0 % (ref 0.0–0.2)

## 2021-05-16 LAB — LIPASE, BLOOD: Lipase: 21 U/L (ref 11–51)

## 2021-05-16 MED ORDER — ONDANSETRON HCL 4 MG/2ML IJ SOLN
4.0000 mg | Freq: Once | INTRAMUSCULAR | Status: AC
  Administered 2021-05-17: 4 mg via INTRAVENOUS
  Filled 2021-05-16: qty 2

## 2021-05-16 MED ORDER — IOHEXOL 300 MG/ML  SOLN
100.0000 mL | Freq: Once | INTRAMUSCULAR | Status: AC | PRN
  Administered 2021-05-16: 100 mL via INTRAVENOUS

## 2021-05-16 MED ORDER — FENTANYL CITRATE PF 50 MCG/ML IJ SOSY
25.0000 ug | PREFILLED_SYRINGE | Freq: Once | INTRAMUSCULAR | Status: AC
  Administered 2021-05-17: 25 ug via INTRAVENOUS
  Filled 2021-05-16: qty 1

## 2021-05-16 NOTE — ED Provider Notes (Signed)
[facility] Provider Note   CSN: [id number] Arrival date & time: 05/16/21  2130     History  Chief Complaint  Patient presents with   Abdominal Pain    Shawn Blanchard is a 86 y.o. male.  The history is provided by the patient and the spouse.  Abdominal Pain Pain location:  LLQ Pain quality: pressure   Pain quality: not bloating   Pain radiates to:  Does not radiate Pain severity:  Severe Onset quality:  Gradual Duration:  1 day Timing:  Constant Progression:  Unchanged Chronicity:  Recurrent Context: previous surgery   Relieved by:  Nothing Worsened by:  Nothing Ineffective treatments:  None tried Associated symptoms: nausea   Associated symptoms: no diarrhea, no fever and no vomiting   Risk factors: multiple surgeries   Patient with cardiac disease and h/o SBO presents with abdominal pain and nausea today.  No f/c/r.  No diarrhea.      Home Medications Prior to Admission medications   Medication Sig Start Date End Date Taking? Authorizing Provider  Alpha-Lipoic Acid 100 MG CAPS Take 1 capsule by mouth in the morning and at bedtime.    [provider]  carvedilol (COREG) 3.125 MG tablet TAKE 1 TABLET BY MOUTH 2 TIMES A DAY WITH A MEAL 09/13/20   Tobb, Kardie, DO  Cyanocobalamin (VITAMIN B 12 PO) Take by mouth daily.    [provider]  dabigatran (PRADAXA) 150 MG CAPS capsule TAKE 1 CAPSULE BY MOUTH 2 TIMES DAILY 02/12/21   Richardo Priest, MD  ezetimibe (ZETIA) 10 MG tablet Take 10 mg by mouth daily. 04/06/20   [provider]  furosemide (LASIX) 20 MG tablet TAKE 1 TABLET BY MOUTH 2 TIMES A DAY 03/05/21   [name], Hilton Cork, MD  glucose blood test strip OneTouch Ultra Blue Test Strip    [provider]  hydrocortisone 2.5 % ointment Apply topically. 11/29/20   [provider]  Iron, Ferrous Sulfate, 325 (65 Fe) MG TABS Take 325 mg by mouth 2 (two) times daily. 03/30/21   Haydee Salter, MD   JANUMET XR [phone] MG TB24 TAKE 1 TABLET BY MOUTH EVERY DAY WITH BREAKFAST (FOR DIABETES) 05/07/21   Haydee Salter, MD  Lancets [facility] DELICA PLUS [id number]) Rew Plus Lancet 33 gauge    [provider]  melatonin 3 MG TABS tablet Take 3 mg by mouth at bedtime as needed (sleep).    [provider]  metFORMIN (GLUCOPHAGE-XR) 500 MG 24 hr tablet Take 1 tablet (500 mg total) by mouth daily in the afternoon. 03/29/21   Haydee Salter, MD  Multiple Vitamin (MULTI-VITAMINS PO) Take 1 tablet by mouth daily.    [provider]  nitroGLYCERIN (NITROSTAT) 0.4 MG SL tablet PLACE 1 TABLET UNDER TONGUE EVERY 5 MINUTES AS NEEDED FOR CHEST PAIN (UP TO 3 DOSES) 02/02/20   Richardo Priest, MD  Polyethyl Glycol-Propyl Glycol 0.4-0.3 % SOLN Place 1 drop into both eyes as needed.    [provider]  polyvinyl alcohol (LIQUIFILM TEARS) 1.4 % ophthalmic solution Place 1 drop into both eyes as needed for dry eyes.    [provider]  REPATHA SURECLICK 010 MG/ML SOAJ Inject 140 mg into the skin every 14 (fourteen) days. 01/01/21   Richardo Priest, MD  Turmeric 400 MG CAPS See admin instructions.    [provider]      Allergies    Atorvastatin, Jardiance [empagliflozin], Sulfa  antibiotics, Ace inhibitors, Amoxicillin, Meperidine, Meperidine hcl, Naproxen sodium, Sulfamethoxazole, and Tetracycline    Review of Systems   Review of Systems  Constitutional:  Negative for fever.  HENT:  Negative for facial swelling.   Eyes:  Negative for redness.  Respiratory:  Negative for wheezing and stridor.   Cardiovascular:  Negative for leg swelling.  Gastrointestinal:  Positive for abdominal distention, abdominal pain and nausea. Negative for diarrhea and vomiting.  Musculoskeletal:  Negative for neck stiffness.  Neurological:  Negative for facial asymmetry.  Psychiatric/Behavioral:  Negative for agitation.   All other systems reviewed and are  negative.  Physical Exam Updated Vital Signs BP (!) 178/95    Pulse 70    Temp (!) 96.7 F (35.9 C) (Temporal)    Resp 18    Ht 5\' 11"  (1.803 m)    Wt 117.9 kg    SpO2 95%    BMI 36.25 kg/m  Physical Exam Vitals and nursing note reviewed. Exam conducted with a chaperone present.  Constitutional:      Appearance: Normal appearance. He is not diaphoretic.  HENT:     Head: Normocephalic and atraumatic.     Nose: Nose normal.  Eyes:     Conjunctiva/sclera: Conjunctivae normal.     Pupils: Pupils are equal, round, and reactive to light.  Cardiovascular:     Rate and Rhythm: Normal rate and regular rhythm.     Pulses: Normal pulses.     Heart sounds: Normal heart sounds.  Pulmonary:     Effort: Pulmonary effort is normal.     Breath sounds: Normal breath sounds.  Abdominal:     General: Bowel sounds are decreased. There is distension.     Tenderness: There is abdominal tenderness.  Musculoskeletal:        General: Normal range of motion.     Cervical back: Normal range of motion and neck supple.  Skin:    General: Skin is warm and dry.     Capillary Refill: Capillary refill takes less than 2 seconds.  Neurological:     General: No focal deficit present.     Mental Status: He is alert and oriented to person, place, and time.  Psychiatric:        Mood and Affect: Mood normal.        Thought Content: Thought content normal.    ED Results / Procedures / Treatments   Labs (all labs ordered are listed, but only abnormal results are displayed) Labs Reviewed  COMPREHENSIVE METABOLIC PANEL - Abnormal; Notable for the following components:      Result Value   Glucose, Bld 170 (*)    All other components within normal limits  CBC - Abnormal; Notable for the following components:   WBC 12.6 (*)    Hemoglobin 12.7 (*)    All other components within normal limits  URINALYSIS, ROUTINE W REFLEX MICROSCOPIC - Abnormal; Notable for the following components:   Protein, ur 30 (*)     Leukocytes,Ua SMALL (*)    Bacteria, UA RARE (*)    All other components within normal limits  RESP PANEL BY RT-PCR (FLU A&B, COVID) ARPGX2  LIPASE, BLOOD    EKG None  Radiology CT ABDOMEN PELVIS W CONTRAST  Result Date: 05/16/2021 CLINICAL DATA:  Acute nonlocalized abdominal pain. Lower abdominal pain. EXAM: CT ABDOMEN AND PELVIS WITH CONTRAST TECHNIQUE: Multidetector CT imaging of the abdomen and pelvis was performed using the standard protocol following bolus administration of intravenous contrast. RADIATION DOSE  REDUCTION: This exam was performed according to the departmental dose-optimization program which includes automated exposure control, adjustment of the mA and/or kV according to patient size and/or use of iterative reconstruction technique. CONTRAST:  124mL OMNIPAQUE IOHEXOL 300 MG/ML  SOLN COMPARISON:  12/14/2019 FINDINGS: Lower chest: Lung bases are clear. Mild cardiac enlargement. Coronary artery calcifications. Hepatobiliary: No focal liver abnormality is seen. Status post cholecystectomy. No biliary dilatation. Pancreas: Unremarkable. No pancreatic ductal dilatation or surrounding inflammatory changes. Spleen: Normal in size without focal abnormality. Adrenals/Urinary Tract: Adrenal glands are unremarkable. Kidneys are normal, without renal calculi, focal lesion, or hydronephrosis. Bladder is unremarkable. Stomach/Bowel: Stomach is unremarkable. Mid abdominal small bowel anastomosis. Proximal small bowel are dilated and fluid-filled with air-fluid levels. Distal small bowel are decompressed. Transition zone appears to be in the right anterior lower quadrant. Some fecalization of small bowel contents may indicate longstanding stasis. Colon is not abnormally distended. Colonic diverticula without evidence of diverticulitis. Appendix is not seen. Small amount of free fluid in the mesentery. Vascular/Lymphatic: Aortic atherosclerosis. No enlarged abdominal or pelvic lymph nodes.  Reproductive: Prostate is unremarkable. Other: Postoperative changes along the midline. No free air or free fluid in the abdomen. Musculoskeletal: Degenerative changes in the spine. IMPRESSION: Dilated proximal small bowel with decompressed distal small bowel consistent with small bowel obstruction. Changes likely to represent adhesions in the setting of previous abdominal surgery. Electronically Signed   By: Lucienne Capers M.D.   On: 05/16/2021 23:25    Procedures Procedures    Medications Ordered in ED Medications  fentaNYL (SUBLIMAZE) injection 25 mcg (has no administration in time range)  iohexol (OMNIPAQUE) 300 MG/ML solution 100 mL (100 mLs Intravenous Contrast Given 05/16/21 2310)    ED Course/ Medical Decision Making/ A&P                           Medical Decision Making Patient with h/o SBo presents with pain, nausea and distention x 1 days   Amount and/or Complexity of Data Reviewed Independent Historian: spouse    Details: see above Labs: ordered.    Details: elevated white count at 12.6,  Normal electrolytes Radiology: ordered.    Details: SBO by me on CT, agree with radiology Discussion of management or test interpretation with external provider(s): Case d/w Dr. Zenia Resides of surgery.  Please admit to medicine surgery will consult  Case d/w hospitalist who will admit the patient   Risk Prescription drug management. Parenteral controlled substances. Decision regarding hospitalization. Risk Details: Patient had NGT placed.  Pain medication administered.  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     Final Clinical Impression(s) / ED Diagnoses Final diagnoses:  SBO (small bowel obstruction) ([place])   The patient appears reasonably stabilized for admission considering the current resources, flow, and capabilities available in the ED at this time, and I doubt any other [facility] requiring further screening and/or treatment in the ED prior to  admission.      Taliya Mcclard, MD 05/17/21 0001

## 2021-05-16 NOTE — ED Triage Notes (Signed)
Patient here POV from Home with ABD Pain.  Pain is Lower Quadrants and is Pressure-Like. Present since this Afternoon and worsening since.  Moderate Nausea. No Emesis. No Diarrhea. No Constipation. No Fevers. No Urinary Symptoms.  Reports History of Bowel Obstruction in 2021.   NAD Noted during Triage. A&Ox4. GCS 15. Ambulatory with Gilford Rile.

## 2021-05-17 ENCOUNTER — Inpatient Hospital Stay (HOSPITAL_COMMUNITY): Payer: Medicare PPO

## 2021-05-17 ENCOUNTER — Encounter (HOSPITAL_BASED_OUTPATIENT_CLINIC_OR_DEPARTMENT_OTHER): Payer: Self-pay | Admitting: Emergency Medicine

## 2021-05-17 ENCOUNTER — Emergency Department (HOSPITAL_BASED_OUTPATIENT_CLINIC_OR_DEPARTMENT_OTHER): Payer: Medicare PPO

## 2021-05-17 DIAGNOSIS — Z4682 Encounter for fitting and adjustment of non-vascular catheter: Secondary | ICD-10-CM | POA: Diagnosis not present

## 2021-05-17 DIAGNOSIS — K56609 Unspecified intestinal obstruction, unspecified as to partial versus complete obstruction: Secondary | ICD-10-CM | POA: Diagnosis not present

## 2021-05-17 DIAGNOSIS — K5651 Intestinal adhesions [bands], with partial obstruction: Secondary | ICD-10-CM | POA: Diagnosis present

## 2021-05-17 DIAGNOSIS — E1140 Type 2 diabetes mellitus with diabetic neuropathy, unspecified: Secondary | ICD-10-CM

## 2021-05-17 DIAGNOSIS — E785 Hyperlipidemia, unspecified: Secondary | ICD-10-CM | POA: Diagnosis present

## 2021-05-17 DIAGNOSIS — Z85068 Personal history of other malignant neoplasm of small intestine: Secondary | ICD-10-CM | POA: Diagnosis not present

## 2021-05-17 DIAGNOSIS — N3000 Acute cystitis without hematuria: Secondary | ICD-10-CM

## 2021-05-17 DIAGNOSIS — N390 Urinary tract infection, site not specified: Secondary | ICD-10-CM

## 2021-05-17 DIAGNOSIS — Z7984 Long term (current) use of oral hypoglycemic drugs: Secondary | ICD-10-CM | POA: Diagnosis not present

## 2021-05-17 DIAGNOSIS — E1149 Type 2 diabetes mellitus with other diabetic neurological complication: Secondary | ICD-10-CM | POA: Diagnosis present

## 2021-05-17 DIAGNOSIS — Z7901 Long term (current) use of anticoagulants: Secondary | ICD-10-CM | POA: Diagnosis not present

## 2021-05-17 DIAGNOSIS — G4733 Obstructive sleep apnea (adult) (pediatric): Secondary | ICD-10-CM | POA: Diagnosis present

## 2021-05-17 DIAGNOSIS — I160 Hypertensive urgency: Secondary | ICD-10-CM

## 2021-05-17 DIAGNOSIS — I252 Old myocardial infarction: Secondary | ICD-10-CM | POA: Diagnosis not present

## 2021-05-17 DIAGNOSIS — Z6835 Body mass index (BMI) 35.0-35.9, adult: Secondary | ICD-10-CM | POA: Diagnosis not present

## 2021-05-17 DIAGNOSIS — G2581 Restless legs syndrome: Secondary | ICD-10-CM | POA: Diagnosis present

## 2021-05-17 DIAGNOSIS — E669 Obesity, unspecified: Secondary | ICD-10-CM | POA: Diagnosis present

## 2021-05-17 DIAGNOSIS — Z955 Presence of coronary angioplasty implant and graft: Secondary | ICD-10-CM | POA: Diagnosis not present

## 2021-05-17 DIAGNOSIS — I482 Chronic atrial fibrillation, unspecified: Secondary | ICD-10-CM

## 2021-05-17 DIAGNOSIS — I272 Pulmonary hypertension, unspecified: Secondary | ICD-10-CM | POA: Diagnosis present

## 2021-05-17 DIAGNOSIS — Z8579 Personal history of other malignant neoplasms of lymphoid, hematopoietic and related tissues: Secondary | ICD-10-CM

## 2021-05-17 DIAGNOSIS — D72829 Elevated white blood cell count, unspecified: Secondary | ICD-10-CM | POA: Diagnosis not present

## 2021-05-17 DIAGNOSIS — E876 Hypokalemia: Secondary | ICD-10-CM | POA: Diagnosis present

## 2021-05-17 DIAGNOSIS — D649 Anemia, unspecified: Secondary | ICD-10-CM

## 2021-05-17 DIAGNOSIS — I5022 Chronic systolic (congestive) heart failure: Secondary | ICD-10-CM | POA: Diagnosis present

## 2021-05-17 DIAGNOSIS — E1142 Type 2 diabetes mellitus with diabetic polyneuropathy: Secondary | ICD-10-CM | POA: Diagnosis present

## 2021-05-17 DIAGNOSIS — I25119 Atherosclerotic heart disease of native coronary artery with unspecified angina pectoris: Secondary | ICD-10-CM | POA: Diagnosis present

## 2021-05-17 DIAGNOSIS — Z96653 Presence of artificial knee joint, bilateral: Secondary | ICD-10-CM | POA: Diagnosis present

## 2021-05-17 DIAGNOSIS — I110 Hypertensive heart disease with heart failure: Secondary | ICD-10-CM | POA: Diagnosis present

## 2021-05-17 DIAGNOSIS — K219 Gastro-esophageal reflux disease without esophagitis: Secondary | ICD-10-CM | POA: Diagnosis present

## 2021-05-17 DIAGNOSIS — I4821 Permanent atrial fibrillation: Secondary | ICD-10-CM | POA: Diagnosis present

## 2021-05-17 DIAGNOSIS — Z79899 Other long term (current) drug therapy: Secondary | ICD-10-CM | POA: Diagnosis not present

## 2021-05-17 DIAGNOSIS — Z8572 Personal history of non-Hodgkin lymphomas: Secondary | ICD-10-CM

## 2021-05-17 HISTORY — DX: Hypertensive urgency: I16.0

## 2021-05-17 HISTORY — DX: Urinary tract infection, site not specified: N39.0

## 2021-05-17 HISTORY — DX: Personal history of non-Hodgkin lymphomas: Z85.72

## 2021-05-17 HISTORY — DX: Anemia, unspecified: D64.9

## 2021-05-17 LAB — HEPARIN LEVEL (UNFRACTIONATED): Heparin Unfractionated: <0.1 IU/mL — ABNORMAL LOW (ref 0.30–0.70)

## 2021-05-17 LAB — GLUCOSE, CAPILLARY
Glucose-Capillary: 189 mg/dL — ABNORMAL HIGH (ref 70–99)
Glucose-Capillary: 170 mg/dL — ABNORMAL HIGH (ref 70–99)
Glucose-Capillary: 215 mg/dL — ABNORMAL HIGH (ref 70–99)

## 2021-05-17 MED ORDER — MORPHINE SULFATE (PF) 2 MG/ML IV SOLN
2.0000 mg | INTRAVENOUS | Status: DC | PRN
  Administered 2021-05-17: 2 mg via INTRAVENOUS
  Filled 2021-05-17 (×2): qty 1

## 2021-05-17 MED ORDER — HYDRALAZINE HCL 20 MG/ML IJ SOLN
10.0000 mg | INTRAMUSCULAR | Status: DC | PRN
  Filled 2021-05-17: qty 1

## 2021-05-17 MED ORDER — ACETAMINOPHEN 325 MG PO TABS: 650.0000 mg | ORAL_TABLET | Freq: Four times a day (QID) | ORAL | Status: DC | PRN

## 2021-05-17 MED ORDER — METOPROLOL TARTRATE 5 MG/5ML IV SOLN: 2.5000 mg | Freq: Four times a day (QID) | INTRAVENOUS | Status: DC | PRN

## 2021-05-17 MED ORDER — DEXTROSE 50 % IV SOLN: 1.0000 | INTRAVENOUS | Status: DC | PRN

## 2021-05-17 MED ORDER — DIPHENHYDRAMINE HCL 12.5 MG/5ML PO ELIX: 12.5000 mg | ORAL_SOLUTION | Freq: Four times a day (QID) | ORAL | Status: DC | PRN

## 2021-05-17 MED ORDER — ONDANSETRON HCL 4 MG/2ML IJ SOLN: 4.0000 mg | Freq: Four times a day (QID) | INTRAMUSCULAR | Status: DC | PRN

## 2021-05-17 MED ORDER — SODIUM CHLORIDE 0.9 % IV SOLN
2.0000 g | INTRAVENOUS | Status: DC
  Administered 2021-05-17 – 2021-05-19 (×3): 2 g via INTRAVENOUS
  Filled 2021-05-17 (×3): qty 20

## 2021-05-17 MED ORDER — HEPARIN (PORCINE) 25000 UT/250ML-% IV SOLN
1900.0000 [IU]/h | INTRAVENOUS | Status: DC
  Administered 2021-05-17: 1600 [IU]/h via INTRAVENOUS
  Administered 2021-05-18 – 2021-05-22 (×6): 1900 [IU]/h via INTRAVENOUS
  Filled 2021-05-17 (×9): qty 250

## 2021-05-17 MED ORDER — INSULIN ASPART 100 UNIT/ML IJ SOLN
0.0000 [IU] | Freq: Four times a day (QID) | INTRAMUSCULAR | Status: DC
  Administered 2021-05-17: 1 [IU] via SUBCUTANEOUS
  Administered 2021-05-17: 2 [IU] via SUBCUTANEOUS
  Administered 2021-05-18 (×3): 1 [IU] via SUBCUTANEOUS

## 2021-05-17 MED ORDER — ORAL CARE MOUTH RINSE
15.0000 mL | Freq: Two times a day (BID) | OROMUCOSAL | Status: DC
  Administered 2021-05-17 – 2021-05-22 (×10): 15 mL via OROMUCOSAL

## 2021-05-17 MED ORDER — PANTOPRAZOLE SODIUM 40 MG IV SOLR
40.0000 mg | Freq: Two times a day (BID) | INTRAVENOUS | Status: DC
  Administered 2021-05-17 – 2021-05-21 (×10): 40 mg via INTRAVENOUS
  Filled 2021-05-17 (×10): qty 10

## 2021-05-17 MED ORDER — SODIUM CHLORIDE 0.9 % IV SOLN: INTRAVENOUS | Status: DC

## 2021-05-17 MED ORDER — DIPHENHYDRAMINE HCL 50 MG/ML IJ SOLN: 12.5000 mg | Freq: Four times a day (QID) | INTRAMUSCULAR | Status: DC | PRN

## 2021-05-17 MED ORDER — ACETAMINOPHEN 650 MG RE SUPP: 650.0000 mg | Freq: Four times a day (QID) | RECTAL | Status: DC | PRN

## 2021-05-17 MED ORDER — PHENOL 1.4 % MT LIQD
1.0000 | OROMUCOSAL | Status: DC | PRN
  Administered 2021-05-17: 1 via OROMUCOSAL
  Filled 2021-05-17: qty 177

## 2021-05-17 MED ORDER — ALBUTEROL SULFATE (2.5 MG/3ML) 0.083% IN NEBU: 2.5000 mg | INHALATION_SOLUTION | Freq: Four times a day (QID) | RESPIRATORY_TRACT | Status: DC | PRN

## 2021-05-17 MED ORDER — NALOXONE HCL 0.4 MG/ML IJ SOLN: 0.4000 mg | INTRAMUSCULAR | Status: DC | PRN

## 2021-05-17 MED ORDER — SODIUM CHLORIDE 0.9% FLUSH
3.0000 mL | Freq: Two times a day (BID) | INTRAVENOUS | Status: DC
  Administered 2021-05-17 – 2021-05-21 (×9): 3 mL via INTRAVENOUS

## 2021-05-17 MED ORDER — DIATRIZOATE MEGLUMINE & SODIUM 66-10 % PO SOLN
90.0000 mL | Freq: Once | ORAL | Status: AC
  Administered 2021-05-17: 90 mL via NASOGASTRIC
  Filled 2021-05-17: qty 90

## 2021-05-17 MED ORDER — MORPHINE SULFATE 1 MG/ML IV SOLN PCA
INTRAVENOUS | Status: DC
  Administered 2021-05-17: 2 mg via INTRAVENOUS
  Administered 2021-05-18: 3 mg via INTRAVENOUS
  Administered 2021-05-18: 4 mg via INTRAVENOUS
  Administered 2021-05-18: 1 mg via INTRAVENOUS
  Filled 2021-05-17: qty 30

## 2021-05-17 MED ORDER — SODIUM CHLORIDE 0.9% FLUSH: 9.0000 mL | INTRAVENOUS | Status: DC | PRN

## 2021-05-17 NOTE — ED Notes (Signed)
Called [facility] to transport patient to [place] 6N room 4

## 2021-05-17 NOTE — Progress Notes (Addendum)
/  Patient PCA set up and continuous pulse ox implemented. Patient on 2L to keep saturation above 92. NG tube advanced 8cm as per PA order. Heparin started earlier today, running at 16. Patient started on cardiac monitoring.   Gastrographin administered 1217 on 05/17/21. Radiology made aware

## 2021-05-17 NOTE — Assessment & Plan Note (Signed)
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

## 2021-05-17 NOTE — Assessment & Plan Note (Addendum)
Patient appears to be relatively rate controlled.  Home medications include Pradaxa and Coreg 3.125 mg twice daily. -Hold Pradaxa -Heparin per pharmacy -Metoprolol IV as needed for rate control

## 2021-05-17 NOTE — Consult Note (Addendum)
Shawn Blanchard June 02-03-1936  [id number].    Requesting MD: Tamala Julian, MD Chief Complaint/Reason for Consult: SBO  HPI:  Shawn Blanchard is an 86 year old male with a past medical history of CHF, CAD, A-fib on Pradaxa, hypertension hyperlipidemia pulmonary hypertension OSA, obesity, and prior history of SBO who presented to the emergency department with a chief complaint acute onset abdominal pain that started at 5 PM yesterday.  Pain described as cramping, intermittent,  central, lower abd pain that waxes and wanes.  Associated symptoms include nausea.  Denies fever, chills, changes in diet, or blood in his stool.  Reports loose stools at baseline about every other day. Last flatus overnight.  Last bowel movement yesterday morning.   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 He underwent 4 rounds of chemo following this surgery. He was admitted for SBO in 2021 that resolved with nonoperative management.  He reports a history of about 7 SBOs in the past 30 years that all resolved with non-op measures. He was recently seen in the GI office for work-up of anemia and diarrhea.  Last dose of his anticoagulant was yesterday AM (2/8) .  Patient denies tobacco, alcohol, or drug use.  He is currently lives in [place].  His daughter is at the bedside.   ROS: Review of Systems  All other systems reviewed and are negative.  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   Arthritis 03/12/2016   Bilateral hand pain 03/25/2019   Blepharitis of both upper and lower eyelid 10/02/2018   Carpal tunnel syndrome of right wrist 03/25/2019   Chronic anticoagulation 10/11/2016   Chronic atrial fibrillation ([place]) 06/12/2015   Overview:  Managed CARDS   Chronic diastolic heart failure ([place]) 09/01/2015   Coronary artery disease involving native coronary artery of native heart with angina pectoris ([place]) 02/15/2015   Overview:  Hx of MI with stent to LAD 2002 MPS 2016 with EF 47% and no ischemia   Dermatochalasis of both upper eyelids 11/21/2016   Dermatochalasis of right upper eyelid 10/02/2018   Diabetic polyneuropathy associated with type 2 diabetes mellitus ([place]) 12/16/2018   2020   Dyslipidemia 11/07/2017   Essential hypertension 11/07/2017   Ganglion cyst 11/27/2015   Overview:  2017: dorsum right hand   Gastroesophageal reflux disease without esophagitis 06/12/2015   Hyperlipidemia 04/11/2016   Hypertensive heart disease with heart failure ([place]) 02/15/2015   Idiopathic chronic gout 05/17/2015   Insomnia 11/27/2015   Leukocytosis 04/11/2016   Lightheadedness 10/11/2016   Long term current use of oral hypoglycemic drug 10/02/2018   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[place]) 08/30/2015   Male erectile disorder 06/17/2016   Meibomian gland  dysfunction (MGD) of both eyes 10/02/2018   MI (myocardial infarction) ([place]) 02/15/2015   Overview:  2002   Obesity 04/11/2016   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 exacerbation ([place]) 06/12/2015   Obstructive sleep apnea 08/30/2015   Overview:  CPAP   Old MI (myocardial infarction)    Orthostatic dizziness 09/15/2017   2018: med related   Permanent atrial fibrillation ([place]) 06/12/2015   Overview:  Managed CARDS   Pseudophakia of both eyes 03/12/2016   Pulmonary hypertension ([place]) 08/30/2015   Overview:  Managed CARDS   Purulent inflammation of skin 12/29/2018   Restless leg  syndrome 08/30/2015   Restrictive lung disease 12/26/2016   Overview:  2018: mod on spirometry   SBO (small bowel obstruction) ([place]) 12/14/2019   Senile nuclear sclerosis 05/02/2016   Small bowel obstruction ([place]) 04/11/2016   SOB (shortness of breath) 08/14/2015   Type 2 diabetes mellitus with neurologic complication, without long-term current use of insulin ([place]) 12/02/2014   Unilateral inguinal hernia without obstruction or gangrene 06/12/2015    Past Surgical History:  Procedure Laterality Date   ABDOMINAL ADHESION SURGERY     x 2   BLEPHAROPLASTY Bilateral    CARPAL TUNNEL RELEASE Right 2021   CATARACT EXTRACTION Bilateral    CERVICAL SPINE SURGERY     CHOLECYSTECTOMY     CORONARY ANGIOPLASTY WITH STENT PLACEMENT  2000   NASAL SINUS SURGERY     ORIF TIBIAL SHAFT FRACTURE W/ PLATES AND SCREWS Left    PARTIAL KNEE ARTHROPLASTY Bilateral    SMALL INTESTINE SURGERY     Adenocarcinoma   TONSILLECTOMY      Social History:  reports that he has never smoked. He has never used smokeless tobacco. He reports that he does not currently use alcohol. He reports that he does not use drugs.  Allergies:  Allergies  Allergen Reactions   Atorvastatin Other (See Comments)    Dizziness.   Jardiance [Empagliflozin] Other (See Comments)    Orthostasis   Sulfa Antibiotics    Ace Inhibitors Rash   Amoxicillin Rash   Meperidine Rash   Meperidine Hcl Rash   Naproxen Sodium Rash   Sulfamethoxazole Rash   Tetracycline Rash    Medications Prior to Admission  Medication Sig Dispense Refill   Alpha-Lipoic Acid 100 MG CAPS Take 1 capsule by mouth in the morning and at bedtime.     carvedilol (COREG) 3.125 MG tablet TAKE 1 TABLET BY MOUTH 2 TIMES A DAY WITH A MEAL 180 tablet 3   Cyanocobalamin (VITAMIN B 12 PO) Take by mouth daily.     dabigatran (PRADAXA) 150 MG CAPS capsule TAKE 1 CAPSULE BY MOUTH 2 TIMES DAILY 180 capsule 2   ezetimibe (ZETIA) 10 MG tablet Take 10 mg by mouth daily.     furosemide  (LASIX) 20 MG tablet TAKE 1 TABLET BY MOUTH 2 TIMES A DAY 180 tablet 3   glucose blood test strip OneTouch Ultra Blue Test Strip     hydrocortisone 2.5 % ointment Apply topically.     Iron, Ferrous Sulfate, 325 (65 Fe) MG TABS Take 325 mg by mouth 2 (two) times daily. 60 tablet 2   JANUMET XR [phone] MG TB24 TAKE 1 TABLET BY MOUTH EVERY DAY WITH BREAKFAST (FOR DIABETES) 90 tablet 0   Lancets (ONETOUCH DELICA PLUS [id number]) MISC OneTouch Delica Plus Lancet 33 gauge     melatonin 3 MG TABS tablet Take 3 mg by mouth at bedtime as needed (sleep).     metFORMIN (GLUCOPHAGE-XR)  500 MG 24 hr tablet Take 1 tablet (500 mg total) by mouth daily in the afternoon. 90 tablet 3   Multiple Vitamin (MULTI-VITAMINS PO) Take 1 tablet by mouth daily.     nitroGLYCERIN (NITROSTAT) 0.4 MG SL tablet PLACE 1 TABLET UNDER TONGUE EVERY 5 MINUTES AS NEEDED FOR CHEST PAIN (UP TO 3 DOSES) 25 tablet 11   Polyethyl Glycol-Propyl Glycol 0.4-0.3 % SOLN Place 1 drop into both eyes as needed.     polyvinyl alcohol (LIQUIFILM TEARS) 1.4 % ophthalmic solution Place 1 drop into both eyes as needed for dry eyes.     REPATHA SURECLICK 071 MG/ML SOAJ Inject 140 mg into the skin every 14 (fourteen) days. 2 mL 6   Turmeric 400 MG CAPS See admin instructions.       Physical Exam: Blood pressure (!) 171/74, pulse 79, temperature 98 F (36.7 C), resp. rate 17, height 5\' 11"  (1.803 m), weight 116.2 kg, SpO2 97 %. General: Pleasant male  laying on hospital bed, appears stated age, NAD. HEENT: head -normocephalic, atraumatic; Eyes: PERRLA, no conjunctival injection; Ears- no external lesions or tenderness, TM visible with no redness or bulging; Nose: nonerythematous, no polyps/masses; Throat: pink mucosa, uvula midline, no exudates.  Neck- Trachea is midline, no thyromegaly or JVD appreciated.  CV- RRR, normal S1/S2, no M/R/G, radial and dorsalis pedis pulses 2+ BL, cap refill < 2 seconds. Pulm- breathing is non-labored. CTABL, no  wheezes, rhales, rhonchi. Abd- soft, obese, distended, hypoactive BS, nontender, previous laparotomy scar present, small umbilical hernia that is soft. NG in place GU- deferred  MSK- UE/LE symmetrical, no cyanosis, clubbing, or edema. Neuro- CN II-XII grossly in tact, no paresthesias. Psych- Alert and Oriented x3 with appropriate affect Skin: warm and dry, no rashes or lesions   Results for orders placed or performed during the hospital encounter of 05/16/21 (from the past 48 hour(s))  Lipase, blood     Status: None   Collection Time: 05/16/21  9:59 PM  Result Value Ref Range   Lipase 21 11 - 51 U/L    Comment: Performed at [facility], [address] [place], [place] [address]  Comprehensive metabolic panel     Status: Abnormal   Collection Time: 05/16/21  9:59 PM  Result Value Ref Range   Sodium 138 135 - 145 mmol/L   Potassium 4.5 3.5 - 5.1 mmol/L   Chloride 102 98 - 111 mmol/L   CO2 24 22 - 32 mmol/L   Glucose, Bld 170 (H) 70 - 99 mg/dL    Comment: Glucose reference range applies only to samples taken after fasting for at least 8 hours.   BUN 20 8 - 23 mg/dL   Creatinine, Ser 1.12 0.61 - 1.24 mg/dL   Calcium 9.9 8.9 - 10.3 mg/dL   Total Protein 7.6 6.5 - 8.1 g/dL   Albumin 4.5 3.5 - 5.0 g/dL   AST 16 15 - 41 U/L   ALT 11 0 - 44 U/L   Alkaline Phosphatase 48 38 - 126 U/L   Total Bilirubin 0.9 0.3 - 1.2 mg/dL   GFR, Estimated >60 >60 mL/min    Comment: (NOTE) Calculated using the CKD-EPI Creatinine Equation (2021)    Anion gap 12 5 - 15    Comment: Performed at [facility], [address], [place], [place] [address]  CBC     Status: Abnormal   Collection Time: 05/16/21  9:59 PM  Result Value Ref Range   WBC 12.6 (H) 4.0 -  10.5 K/uL   RBC 4.82 4.22 - 5.81 MIL/uL   Hemoglobin 12.7 (L) 13.0 - 17.0 g/dL   HCT 40.3 39.0 - 52.0 %   MCV 83.6 80.0 - 100.0 fL   MCH 26.3 26.0 - 34.0 pg   MCHC 31.5 30.0 - 36.0 g/dL   RDW 15.4 11.5 -  15.5 %   Platelets 232 150 - 400 K/uL   nRBC 0.0 0.0 - 0.2 %    Comment: Performed at [facility], [address], [place], [place] [address]  Urinalysis, Routine w reflex microscopic Urine, Clean Catch     Status: Abnormal   Collection Time: 05/16/21  9:59 PM  Result Value Ref Range   Color, Urine YELLOW YELLOW   APPearance CLEAR CLEAR   Specific Gravity, Urine 1.021 1.005 - 1.030   pH 5.0 5.0 - 8.0   Glucose, UA NEGATIVE NEGATIVE mg/dL   Hgb urine dipstick NEGATIVE NEGATIVE   Bilirubin Urine NEGATIVE NEGATIVE   Ketones, ur NEGATIVE NEGATIVE mg/dL   Protein, ur 30 (A) NEGATIVE mg/dL   Nitrite NEGATIVE NEGATIVE   Leukocytes,Ua SMALL (A) NEGATIVE   RBC / HPF 0-5 0 - 5 RBC/hpf   WBC, UA 6-10 0 - 5 WBC/hpf   Bacteria, UA RARE (A) NONE SEEN   Squamous Epithelial / LPF 0-5 0 - 5   Mucus PRESENT    Hyaline Casts, UA PRESENT     Comment: Performed at [facility], [address], [place], [place] [address]   CT ABDOMEN PELVIS W CONTRAST  Result Date: 05/16/2021 CLINICAL DATA:  Acute nonlocalized abdominal pain. Lower abdominal pain. EXAM: CT ABDOMEN AND PELVIS WITH CONTRAST TECHNIQUE: Multidetector CT imaging of the abdomen and pelvis was performed using the standard protocol following bolus administration of intravenous contrast. RADIATION DOSE REDUCTION: This exam was performed according to the departmental dose-optimization program which includes automated exposure control, adjustment of the mA and/or kV according to patient size and/or use of iterative reconstruction technique. CONTRAST:  154mL OMNIPAQUE IOHEXOL 300 MG/ML  SOLN COMPARISON:  12/14/2019 FINDINGS: Lower chest: Lung bases are clear. Mild cardiac enlargement. Coronary artery calcifications. Hepatobiliary: No focal liver abnormality is seen. Status post cholecystectomy. No biliary dilatation. Pancreas: Unremarkable. No pancreatic ductal dilatation or surrounding inflammatory changes. Spleen:  Normal in size without focal abnormality. Adrenals/Urinary Tract: Adrenal glands are unremarkable. Kidneys are normal, without renal calculi, focal lesion, or hydronephrosis. Bladder is unremarkable. Stomach/Bowel: Stomach is unremarkable. Mid abdominal small bowel anastomosis. Proximal small bowel are dilated and fluid-filled with air-fluid levels. Distal small bowel are decompressed. Transition zone appears to be in the right anterior lower quadrant. Some fecalization of small bowel contents may indicate longstanding stasis. Colon is not abnormally distended. Colonic diverticula without evidence of diverticulitis. Appendix is not seen. Small amount of free fluid in the mesentery. Vascular/Lymphatic: Aortic atherosclerosis. No enlarged abdominal or pelvic lymph nodes. Reproductive: Prostate is unremarkable. Other: Postoperative changes along the midline. No free air or free fluid in the abdomen. Musculoskeletal: Degenerative changes in the spine. IMPRESSION: Dilated proximal small bowel with decompressed distal small bowel consistent with small bowel obstruction. Changes likely to represent adhesions in the setting of previous abdominal surgery. Electronically Signed   By: Lucienne Capers M.D.   On: 05/16/2021 23:25   DG Chest Portable 1 View  Result Date: 05/17/2021 CLINICAL DATA:  Nasogastric tube placement EXAM: PORTABLE CHEST 1 VIEW COMPARISON:  None. FINDINGS: Nasogastric tube is in place with its tip extending into the proximal body of  the stomach. Lungs are clear. No pneumothorax or pleural effusion. Cardiac size within normal limits. No acute bone abnormality. IMPRESSION: Nasogastric tube tip within the proximal body of the stomach. Electronically Signed   By: Fidela Salisbury M.D.   On: 05/17/2021 00:46      Assessment/Plan SBO, likely due to intra-abdominal adhesions in the setting of mult abdominal surgeries  - afebrile, vitals stable, WBC 12.6 yesterday - CT abd pelvis read as SBO with distal  transition zone. There are no radiographic or clinical signs of a closed loop obstruction. - NGT w/ side port at GE jxn, RN to advance 8 cm then re-secure. Then SBO protocol with gastrografin - no emergent surgery needs, CCS will follow. Hopefully obstruction will resolve with non-operative measures.  CHF CAD s/p PCI 2002 A.fib on pradaxa - hold pradaxa  DM HTN Obesity    FEN - NPO, IVF, NG to LIWS VTE - SCD's, okay for chemical DVT prophylaxis or hep gtt, hold Pradaxa ID - none recommended from a [place], [facility] 05/17/2021, 7:12 AM Please see Amion for pager number during day hours 7:00am-4:30pm or 7:00am -11:30am on weekends

## 2021-05-17 NOTE — Assessment & Plan Note (Addendum)
On admission patient was noted to have blood pressure elevated up to 194/66.

## 2021-05-17 NOTE — H&P (Signed)
History and Physical    Patient: Shawn Blanchard WNI:[id number] DOB: 15-Feb-1936 DOA: 05/16/2021 DOS: the patient was seen and examined on 05/17/2021 PCP: Haydee Salter, MD  Patient coming from: Home  Chief Complaint:  Chief Complaint  Patient presents with   Abdominal Pain    HPI: Shawn Blanchard is a 86 y.o. 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  He had had a late lunch that included pecans prior to onset of symptoms.  Pain was located just below his bellybutton and described as sharp.  Pain comes in waves.  Noted associated symptoms of nausea without vomiting and mild burning with urination which is new.  His last bowel movement was 2 days ago and states that it was diarrhea.  At this time he still able to pass a small amount of flatus intermittently.  He reports that he frequently had small bowel obstructions back in the 1990s after his surgery requiring subsequent surgeries.  His last obstruction occurred back in 12/2019, and was able to be managed medically.  Upon admission to the emergency department patient was seen to be afebrile with blood pressures elevated up to 194/66 and all other vital signs maintained.  Labs significant for WBC 12.6, hemoglobin 12.7, and glucose 170. CT scan noted dilated proximal small bowel with decompressed distal small bowel consistent with SBO.  Urinalysis noted small leukocytes, patient had been given Zofran and fentanyl IV.  Review of Systems: As mentioned in the history of present illness. All other systems reviewed and are negative. 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   Arthritis 03/12/2016   Bilateral hand pain 03/25/2019   Blepharitis of both upper and lower eyelid 10/02/2018   Carpal tunnel syndrome of right wrist 03/25/2019   Chronic anticoagulation 10/11/2016   Chronic atrial fibrillation ([place]) 06/12/2015   Overview:  Managed CARDS   Chronic diastolic heart failure ([place]) 09/01/2015   Coronary artery disease involving native coronary artery of native heart with angina pectoris ([place]) 02/15/2015   Overview:  Hx of MI with stent to LAD 2002 MPS 2016 with EF 47% and no ischemia   Dermatochalasis of both upper eyelids 11/21/2016   Dermatochalasis of right upper eyelid 10/02/2018   Diabetic polyneuropathy associated with type 2 diabetes mellitus ([place]) 12/16/2018   2020   Dyslipidemia 11/07/2017   Essential hypertension 11/07/2017   Ganglion cyst 11/27/2015   Overview:  2017: dorsum right hand   Gastroesophageal reflux disease without esophagitis 06/12/2015   Hyperlipidemia 04/11/2016   Hypertensive heart disease with heart failure ([place]) 02/15/2015   Idiopathic chronic gout 05/17/2015   Insomnia 11/27/2015   Leukocytosis 04/11/2016   Lightheadedness 10/11/2016   Long term current use of oral hypoglycemic drug 10/02/2018   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[place]) 08/30/2015   Male erectile disorder 06/17/2016   Meibomian gland dysfunction (MGD) of both eyes 10/02/2018   MI (myocardial infarction) ([place]) 02/15/2015   Overview:  2002   Obesity 04/11/2016   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 exacerbation ([place]) 06/12/2015   Obstructive sleep apnea 08/30/2015   Overview:  CPAP  Old MI (myocardial infarction)    Orthostatic dizziness 09/15/2017   2018: med related   Permanent atrial fibrillation ([place]) 06/12/2015   Overview:  Managed CARDS   Pseudophakia of both eyes 03/12/2016   Pulmonary hypertension ([place]) 08/30/2015   Overview:  Managed CARDS   Purulent inflammation of  skin 12/29/2018   Restless leg syndrome 08/30/2015   Restrictive lung disease 12/26/2016   Overview:  2018: mod on spirometry   SBO (small bowel obstruction) ([place]) 12/14/2019   Senile nuclear sclerosis 05/02/2016   Small bowel obstruction ([place]) 04/11/2016   SOB (shortness of breath) 08/14/2015   Type 2 diabetes mellitus with neurologic complication, without long-term current use of insulin ([place]) 12/02/2014   Unilateral inguinal hernia without obstruction or gangrene 06/12/2015   Past Surgical History:  Procedure Laterality Date   ABDOMINAL ADHESION SURGERY     x 2   BLEPHAROPLASTY Bilateral    CARPAL TUNNEL RELEASE Right 2021   CATARACT EXTRACTION Bilateral    CERVICAL SPINE SURGERY     CHOLECYSTECTOMY     CORONARY ANGIOPLASTY WITH STENT PLACEMENT  2000   NASAL SINUS SURGERY     ORIF TIBIAL SHAFT FRACTURE W/ PLATES AND SCREWS Left    PARTIAL KNEE ARTHROPLASTY Bilateral    SMALL INTESTINE SURGERY     Adenocarcinoma   TONSILLECTOMY     Social History:  reports that he has never smoked. He has never used smokeless tobacco. He reports that he does not currently use alcohol. He reports that he does not use drugs.  Allergies  Allergen Reactions   Atorvastatin Other (See Comments)    Dizziness.   Jardiance [Empagliflozin] Other (See Comments)    Orthostasis   Sulfa Antibiotics    Ace Inhibitors Rash   Amoxicillin Rash   Meperidine Rash   Meperidine Hcl Rash   Naproxen Sodium Rash   Sulfamethoxazole Rash   Tetracycline Rash    Family History  Problem Relation Age of Onset   Cancer Mother        Ovarian   Diabetes Mother    Heart disease Mother    Heart disease Father    Stroke Father    Diabetes Father    Diabetes Sister    Diabetes Brother    Diabetes Brother    Cancer Paternal Grandfather        Prostate   Colon cancer Neg Hx    Rectal cancer Neg Hx    Pancreatic cancer Neg Hx    Liver cancer Neg Hx    Stomach cancer Neg Hx     Prior to Admission medications    Medication Sig Start Date End Date Taking? Authorizing Provider  Alpha-Lipoic Acid 100 MG CAPS Take 1 capsule by mouth in the morning and at bedtime.    [provider]  carvedilol (COREG) 3.125 MG tablet TAKE 1 TABLET BY MOUTH 2 TIMES A DAY WITH A MEAL 09/13/20   Tobb, Kardie, DO  Cyanocobalamin (VITAMIN B 12 PO) Take by mouth daily.    [provider]  dabigatran (PRADAXA) 150 MG CAPS capsule TAKE 1 CAPSULE BY MOUTH 2 TIMES DAILY 02/12/21   Richardo Priest, MD  ezetimibe (ZETIA) 10 MG tablet Take 10 mg by mouth daily. 04/06/20   [provider]  furosemide (LASIX) 20 MG tablet TAKE 1 TABLET BY MOUTH 2 TIMES A DAY 03/05/21   [name], Hilton Cork, MD  glucose blood test strip OneTouch Ultra Blue Test Strip    [provider]  hydrocortisone 2.5 % ointment Apply topically. 11/29/20   [provider]  Iron, Ferrous Sulfate, 325 (65 Fe) MG TABS Take 325 mg by mouth 2 (two) times daily. 03/30/21   Haydee Salter, MD  JANUMET XR [phone] MG TB24 TAKE 1 TABLET BY MOUTH EVERY DAY WITH BREAKFAST (FOR DIABETES) 05/07/21   Haydee Salter, MD  Lancets [facility] DELICA PLUS [id number]) [place] Plus Lancet 33 gauge    [provider]  melatonin 3 MG TABS tablet Take 3 mg by mouth at bedtime as needed (sleep).    [provider]  metFORMIN (GLUCOPHAGE-XR) 500 MG 24 hr tablet Take 1 tablet (500 mg total) by mouth daily in the afternoon. 03/29/21   Haydee Salter, MD  Multiple Vitamin (MULTI-VITAMINS PO) Take 1 tablet by mouth daily.    [provider]  nitroGLYCERIN (NITROSTAT) 0.4 MG SL tablet PLACE 1 TABLET UNDER TONGUE EVERY 5 MINUTES AS NEEDED FOR CHEST PAIN (UP TO 3 DOSES) 02/02/20   Richardo Priest, MD  Polyethyl Glycol-Propyl Glycol 0.4-0.3 % SOLN Place 1 drop into both eyes as needed.    [provider]  polyvinyl alcohol (LIQUIFILM TEARS) 1.4 % ophthalmic solution Place 1 drop into both eyes as needed for dry eyes.     [provider]  REPATHA SURECLICK 794 MG/ML SOAJ Inject 140 mg into the skin every 14 (fourteen) days. 01/01/21   Richardo Priest, MD  Turmeric 400 MG CAPS See admin instructions.    [provider]    Physical Exam: Vitals:   05/17/21 0045 05/17/21 0304 05/17/21 0746 05/17/21 0800  BP: (!) 178/68 (!) 171/74 (!) 175/61 (!) 161/72  Pulse:  79 70 76  Resp: 17 17 18 19   Temp:  98 F (36.7 C) 97.8 F (36.6 C) 99 F (37.2 C)  TempSrc:      SpO2:  97% 93% 93%  Weight:  116.2 kg    Height:       Exam  Constitutional: Obese elderly male in no acute distress Eyes: PERRL, lids and conjunctivae normal ENMT: Mucous membranes are dry.  NGT in place to suction Neck: normal, supple, no masses, no thyromegaly Respiratory: clear to auscultation bilaterally, no wheezing, no crackles. Normal respiratory effort.  Cardiovascular: Regular rate and rhythm, no murmurs / rubs / gallops. No extremity edema.   Abdomen: Distended abdomen with decreased bowel sounds.  Significant tenderness palpation appreciated.  Umbilical hernia present that appears to be reducible. Musculoskeletal: no clubbing / cyanosis. No joint deformity upper and lower extremities. Good ROM, no contractures. Normal muscle tone.  Skin: Healed scars from prior. Neurologic: CN 2-12 grossly intact. Sensation intact, DTR normal. Strength 5/5 in all 4.  Psychiatric: Normal judgment and insight. Alert and oriented x 3. Normal mood.    Data Reviewed:   Labs reviewed noting leukocytosis 12.6.  Urinalysis did note concern for possible infection.  Assessment and Plan: * SBO (small bowel obstruction) ([place])- (present on admission) Patient present with complaints of abdominal pain.  CT scan noted dilated proximal small bowel with decompressed distal small bowel consistent with SBO.  Notes prior history of several occurrences of the same stemming from small bowel lymphoma back in 1990s requiring surgery and chemotherapy.  Last  small bowel obstruction occurred back in 12/2019 and resolved with conservative management.  He reports morphine helps with pain symptoms but does not last long. -Admit to a medical telemetry bed -N.p.o. -Continue NGT to suction -Normal saline IV fluids at  75 mL/h -Protonix IV twice daily -Antiemetics as needed -Morphine PCA pump per protocol -Appreciate general surgery consultative services, with follow-up for any further recommendation  UTI (urinary tract infection)- (present on admission) Patient reports that he has been having some mild burning with urination which is new.  Urinalysis was positive for small leukocytes, rare bacteria, and and 6-10 WBCs. -Check urine culture -Rocephin IV  Hypertensive urgency- (present on admission) On admission patient was noted to have blood pressure elevated up to 194/66.  Leukocytosis- (present on admission) On admission WBC elevated at 12.6. Suspect secondary to small bowel obstruction and/or suspected UTI. -Follow-up the urine culture -Antibiotics as noted above -Recheck CBC tomorrow morning  Chronic atrial fibrillation ([place])- (present on admission) Patient appears to be relatively rate controlled.  Home medications include Pradaxa and Coreg 3.125 mg twice daily. -Hold Pradaxa -Heparin per pharmacy -Metoprolol IV as needed for rate control  BMI 35.0-35.9,adult BMI 35.73 kg/m  Type 2 diabetes mellitus with neurologic complication, without long-term current use of insulin ([place])- (present on admission) Home blood pressure regimen includes Janumet 100- 1000 mg daily. -Hypoglycemic protocols D50 ordered as needed -Hold Janumet -CBGs every 6 hours with very sensitive SSI   Normocytic anemia- (present on admission) Hemoglobin 12.7 g/dL on admission which appears to be stable.  No reports of bleeding.  Currently on heparin drip. -Recheck CBC tomorrow morning       Advance Care Planning:   Code Status: Full Code   Consults: General  surgery  Family Communication: None  Severity of Illness: The appropriate patient status for this patient is INPATIENT. Inpatient status is judged to be reasonable and necessary in order to provide the required intensity of service to ensure the patient's safety. The patient's presenting symptoms, physical exam findings, and initial radiographic and laboratory data in the context of their chronic comorbidities is felt to place them at high risk for further clinical deterioration. Furthermore, it is not anticipated that the patient will be medically stable for discharge from the hospital within 2 midnights of admission.   * I certify that at the point of admission it is my clinical judgment that the patient will require inpatient hospital care spanning beyond 2 midnights from the point of admission due to high intensity of service, high risk for further deterioration and high frequency of surveillance required.*  Author: Norval Morton, MD 05/17/2021 10:15 AM  For on call review [email or web address].

## 2021-05-17 NOTE — Assessment & Plan Note (Signed)
BMI 35.73 kg/m

## 2021-05-17 NOTE — Assessment & Plan Note (Signed)
Hemoglobin 12.7 g/dL on admission which appears to be stable.  No reports of bleeding.  Currently on heparin drip. -Recheck CBC tomorrow morning

## 2021-05-17 NOTE — Assessment & Plan Note (Addendum)
Patient present with complaints of abdominal pain.  CT scan noted dilated proximal small bowel with decompressed distal small bowel consistent with SBO.  Notes prior history of several occurrences of the same stemming from small bowel lymphoma back in 1990s requiring surgery and chemotherapy.  Last small bowel obstruction occurred back in 12/2019 and resolved with conservative management.  He reports morphine helps with pain symptoms but does not last long. -Admit to a medical telemetry bed -N.p.o. -Continue NGT to suction -Normal saline IV fluids at 75 mL/h -Protonix IV twice daily -Antiemetics as needed -Morphine PCA pump per protocol -Appreciate general surgery consultative services, with follow-up for any further recommendation

## 2021-05-17 NOTE — Progress Notes (Signed)
ANTICOAGULATION CONSULT NOTE  Pharmacy Consult: IV Heparin Indication: atrial fibrillation  Allergies  Allergen Reactions   Atorvastatin Other (See Comments)    Dizziness.   Jardiance [Empagliflozin] Other (See Comments)    Orthostasis   Sulfa Antibiotics    Ace Inhibitors Rash   Amoxicillin Rash   Meperidine Rash   Meperidine Hcl Rash   Naproxen Sodium Rash   Sulfamethoxazole Rash   Tetracycline Rash    Patient Measurements: Height: 5\' 11"  (180.3 cm) Weight: 116.2 kg (256 lb 2.8 oz) IBW/kg (Calculated) : 75.3 Heparin Dosing Weight: 100 kg  Vital Signs: Temp: 97.9 F (36.6 C) (02/09 1956) Temp Source: Oral (02/09 1956) BP: 173/73 (02/09 1956) Pulse Rate: 79 (02/09 1956)  Labs: Recent Labs    05/16/21 2159 05/17/21 2054  HGB 12.7*  --   HCT 40.3  --   PLT 232  --   HEPARINUNFRC  --  <0.10*  CREATININE 1.12  --     Estimated Creatinine Clearance: 64.8 mL/min (by C-G formula based on SCr of 1.12 mg/dL).  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   Arthritis 03/12/2016   Bilateral hand pain 03/25/2019   Blepharitis of both upper and lower eyelid 10/02/2018   Carpal tunnel syndrome of right wrist 03/25/2019   Chronic anticoagulation 10/11/2016   Chronic atrial fibrillation ([place]) 06/12/2015   Overview:  Managed CARDS   Chronic diastolic heart failure ([place]) 09/01/2015   Coronary artery disease involving native coronary artery of native heart with angina pectoris ([place]) 02/15/2015   Overview:  Hx of MI with stent to LAD 2002 MPS 2016 with EF 47% and no ischemia   Dermatochalasis of both upper eyelids 11/21/2016   Dermatochalasis of right upper eyelid 10/02/2018   Diabetic polyneuropathy associated with type 2 diabetes mellitus ([place]) 12/16/2018   2020   Dyslipidemia 11/07/2017   Essential hypertension 11/07/2017   Ganglion cyst 11/27/2015    Overview:  2017: dorsum right hand   Gastroesophageal reflux disease without esophagitis 06/12/2015   Hyperlipidemia 04/11/2016   Hypertensive heart disease with heart failure ([place]) 02/15/2015   Idiopathic chronic gout 05/17/2015   Insomnia 11/27/2015   Leukocytosis 04/11/2016   Lightheadedness 10/11/2016   Long term current use of oral hypoglycemic drug 10/02/2018   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[place]) 08/30/2015   Male erectile disorder 06/17/2016   Meibomian gland dysfunction (MGD) of both eyes 10/02/2018   MI (myocardial infarction) ([place]) 02/15/2015   Overview:  2002   Obesity 04/11/2016   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 exacerbation ([place]) 06/12/2015   Obstructive sleep apnea 08/30/2015   Overview:  CPAP   Old MI (myocardial infarction)    Orthostatic dizziness 09/15/2017   2018: med related   Permanent atrial fibrillation ([place]) 06/12/2015   Overview:  Managed CARDS   Pseudophakia of both eyes 03/12/2016   Pulmonary hypertension ([place]) 08/30/2015   Overview:  Managed CARDS   Purulent inflammation of skin 12/29/2018   Restless leg syndrome 08/30/2015   Restrictive lung disease 12/26/2016   Overview:  2018: mod on spirometry   SBO (small bowel obstruction) ([place]) 12/14/2019   Senile nuclear sclerosis 05/02/2016   Small bowel obstruction ([employer]) 04/11/2016   SOB (shortness of breath) 08/14/2015   Type 2 diabetes mellitus with neurologic complication, without long-term current use of insulin ([place]) 12/02/2014   Unilateral inguinal hernia without obstruction or gangrene 06/12/2015  Assessment: 86 yr old man presented with abdominal pain and was found to have SBO, likely due to intra-abdominal adhesions in setting of multiple abdominal surgeries.  Pharmacy was consulted to transition patient from Pradaxa (last dose 2/8 AM) to IV heparin for history of Afib.  Baseline labs reviewed.  Initial heparin level ~9 hrs after initiation of heparin infusion at 1600 units/hr was <0.10 units/ml, which is below the desired goal  range. H/H 12.7/40.3, plt 232. Per RN, no issues with IV or bleeding observed.  Goal of Therapy:  Heparin level 0.3-0.7 units/ml Monitor platelets by anticoagulation protocol: Yes   Plan:  Increase heparin infusion to 1900 units/hr  Check 8-hr heparin level Monitor daily heparin level and CBC Monitor for bleeding  Gillermina Hu, PharmD, BCPS, [facility] Clinical Pharmacist 05/17/2021, 9:57 PM

## 2021-05-17 NOTE — Assessment & Plan Note (Signed)
Home blood pressure regimen includes Janumet 100- 1000 mg daily. -Hypoglycemic protocols D50 ordered as needed -Hold Janumet -CBGs every 6 hours with very sensitive SSI

## 2021-05-17 NOTE — Progress Notes (Signed)
ANTICOAGULATION CONSULT NOTE - Initial Consult  Pharmacy Consult:  Heparin Indication: atrial fibrillation  Allergies  Allergen Reactions   Atorvastatin Other (See Comments)    Dizziness.   Jardiance [Empagliflozin] Other (See Comments)    Orthostasis   Sulfa Antibiotics    Ace Inhibitors Rash   Amoxicillin Rash   Meperidine Rash   Meperidine Hcl Rash   Naproxen Sodium Rash   Sulfamethoxazole Rash   Tetracycline Rash    Patient Measurements: Height: 5\' 11"  (180.3 cm) Weight: 116.2 kg (256 lb 2.8 oz) IBW/kg (Calculated) : 75.3 Heparin Dosing Weight: 100 kg  Vital Signs: Temp: 99 F (37.2 C) (02/09 0800) BP: 161/72 (02/09 0800) Pulse Rate: 76 (02/09 0800)  Labs: Recent Labs    05/16/21 2159  HGB 12.7*  HCT 40.3  PLT 232  CREATININE 1.12    Estimated Creatinine Clearance: 64.8 mL/min (by C-G formula based on SCr of 1.12 mg/dL).   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   Arthritis 03/12/2016   Bilateral hand pain 03/25/2019   Blepharitis of both upper and lower eyelid 10/02/2018   Carpal tunnel syndrome of right wrist 03/25/2019   Chronic anticoagulation 10/11/2016   Chronic atrial fibrillation ([place]) 06/12/2015   Overview:  Managed CARDS   Chronic diastolic heart failure ([place]) 09/01/2015   Coronary artery disease involving native coronary artery of native heart with angina pectoris ([place]) 02/15/2015   Overview:  Hx of MI with stent to LAD 2002 MPS 2016 with EF 47% and no ischemia   Dermatochalasis of both upper eyelids 11/21/2016   Dermatochalasis of right upper eyelid 10/02/2018   Diabetic polyneuropathy associated with type 2 diabetes mellitus ([name]) 12/16/2018   2020   Dyslipidemia 11/07/2017   Essential hypertension 11/07/2017   Ganglion cyst 11/27/2015   Overview:  2017: dorsum right hand   Gastroesophageal reflux disease without  esophagitis 06/12/2015   Hyperlipidemia 04/11/2016   Hypertensive heart disease with heart failure ([place]) 02/15/2015   Idiopathic chronic gout 05/17/2015   Insomnia 11/27/2015   Leukocytosis 04/11/2016   Lightheadedness 10/11/2016   Long term current use of oral hypoglycemic drug 10/02/2018   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[place]) 08/30/2015   Male erectile disorder 06/17/2016   Meibomian gland dysfunction (MGD) of both eyes 10/02/2018   MI (myocardial infarction) ([place]) 02/15/2015   Overview:  2002   Obesity 04/11/2016   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 exacerbation ([place]) 06/12/2015   Obstructive sleep apnea 08/30/2015   Overview:  CPAP   Old MI (myocardial infarction)    Orthostatic dizziness 09/15/2017   2018: med related   Permanent atrial fibrillation ([place]) 06/12/2015   Overview:  Managed CARDS   Pseudophakia of both eyes 03/12/2016   Pulmonary hypertension ([place]) 08/30/2015   Overview:  Managed CARDS   Purulent inflammation of skin 12/29/2018   Restless leg syndrome 08/30/2015   Restrictive lung disease 12/26/2016   Overview:  2018: mod on spirometry   SBO (small bowel obstruction) ([place]) 12/14/2019   Senile nuclear sclerosis 05/02/2016   Small bowel obstruction ([place]) 04/11/2016   SOB (shortness of breath) 08/14/2015   Type 2 diabetes mellitus with neurologic complication, without long-term current use of insulin ([place]) 12/02/2014   Unilateral inguinal hernia without obstruction or gangrene 06/12/2015    Assessment: 85 YOM presented with abdominal pain, found to have SBO.  Pharmacy consulted to transition patient from Pradaxa (last dose  2/8 AM) to IV heparin for history of Afib.  Baseline labs reviewed.  Goal of Therapy:  Heparin level 0.3-0.7 units/ml Monitor platelets by anticoagulation protocol: Yes   Plan:  Start IV heparin at 1600 units/hr based on previous trend Check 8 hr heparin level Daily heparin level and CBC  Ertha Nabor D. Mina Marble, PharmD, BCPS, [place] 05/17/2021, 10:54 AM

## 2021-05-17 NOTE — Assessment & Plan Note (Addendum)
On admission WBC elevated at 12.6. Suspect secondary to small bowel obstruction and/or suspected UTI. -Follow-up the urine culture -Antibiotics as noted above -Recheck CBC tomorrow morning

## 2021-05-17 NOTE — Assessment & Plan Note (Addendum)
Patient reports that he has been having some mild burning with urination which is new.  Urinalysis was positive for small leukocytes, rare bacteria, and and 6-10 WBCs. -Check urine culture -Rocephin IV

## 2021-05-18 ENCOUNTER — Inpatient Hospital Stay (HOSPITAL_COMMUNITY): Payer: Medicare PPO

## 2021-05-18 LAB — CBC
HCT: 40.5 % (ref 39.0–52.0)
Hemoglobin: 12.7 g/dL — ABNORMAL LOW (ref 13.0–17.0)
MCH: 26.6 pg (ref 26.0–34.0)
MCHC: 31.4 g/dL (ref 30.0–36.0)
MCV: 84.9 fL (ref 80.0–100.0)
Platelets: 228 10*3/uL (ref 150–400)
RBC: 4.77 MIL/uL (ref 4.22–5.81)
RDW: 15.4 % (ref 11.5–15.5)
WBC: 10.7 10*3/uL — ABNORMAL HIGH (ref 4.0–10.5)
nRBC: 0 % (ref 0.0–0.2)

## 2021-05-18 LAB — HEPARIN LEVEL (UNFRACTIONATED)
Heparin Unfractionated: 0.29 IU/mL — ABNORMAL LOW (ref 0.30–0.70)
Heparin Unfractionated: 0.57 IU/mL (ref 0.30–0.70)

## 2021-05-18 LAB — LACTIC ACID, PLASMA: Lactic Acid, Venous: 1 mmol/L (ref 0.5–1.9)

## 2021-05-18 LAB — BASIC METABOLIC PANEL
Anion gap: 13 (ref 5–15)
BUN: 19 mg/dL (ref 8–23)
CO2: 26 mmol/L (ref 22–32)
Calcium: 9 mg/dL (ref 8.9–10.3)
Chloride: 101 mmol/L (ref 98–111)
Creatinine, Ser: 1.13 mg/dL (ref 0.61–1.24)
GFR, Estimated: >60 mL/min (ref >60)
Glucose, Bld: 189 mg/dL — ABNORMAL HIGH (ref 70–99)
Potassium: 4.3 mmol/L (ref 3.5–5.1)
Sodium: 140 mmol/L (ref 135–145)

## 2021-05-18 LAB — URINE CULTURE

## 2021-05-18 LAB — MAGNESIUM: Magnesium: 2.2 mg/dL (ref 1.7–2.4)

## 2021-05-18 LAB — GLUCOSE, CAPILLARY
Glucose-Capillary: 154 mg/dL — ABNORMAL HIGH (ref 70–99)
Glucose-Capillary: 155 mg/dL — ABNORMAL HIGH (ref 70–99)
Glucose-Capillary: 184 mg/dL — ABNORMAL HIGH (ref 70–99)
Glucose-Capillary: 188 mg/dL — ABNORMAL HIGH (ref 70–99)

## 2021-05-18 LAB — PHOSPHORUS: Phosphorus: 3.6 mg/dL (ref 2.5–4.6)

## 2021-05-18 MED ORDER — INSULIN ASPART 100 UNIT/ML IJ SOLN
0.0000 [IU] | Freq: Four times a day (QID) | INTRAMUSCULAR | Status: DC
  Administered 2021-05-19 – 2021-05-20 (×4): 1 [IU] via SUBCUTANEOUS

## 2021-05-18 MED ORDER — METOPROLOL TARTRATE 5 MG/5ML IV SOLN: 2.5000 mg | Freq: Four times a day (QID) | INTRAVENOUS | Status: DC | PRN

## 2021-05-18 MED ORDER — HYDROMORPHONE HCL 1 MG/ML IJ SOLN
1.0000 mg | INTRAMUSCULAR | Status: DC | PRN
  Administered 2021-05-19 – 2021-05-20 (×3): 1 mg via INTRAVENOUS
  Filled 2021-05-18 (×3): qty 1

## 2021-05-18 MED ORDER — ACETAMINOPHEN 10 MG/ML IV SOLN
1000.0000 mg | Freq: Four times a day (QID) | INTRAVENOUS | Status: AC
  Administered 2021-05-18 – 2021-05-19 (×5): 1000 mg via INTRAVENOUS
  Filled 2021-05-18 (×4): qty 100

## 2021-05-18 NOTE — Progress Notes (Addendum)
PROGRESS NOTE  Shawn Blanchard:[id number] DOB: October 20, 1935 DOA: 05/16/2021 PCP: Haydee Salter, MD  HPI/Recap of past 24 hours: Shawn Blanchard is a 86 y.o. 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  Associated with nausea without vomiting.  Last bowel movement was 2 days prior to presentation and was watery.  His last obstruction occurred back in 12/2019, and was able to be managed medically.  Work-up revealed SBO seen on CT scan.  NG tube to suction was placed in the ED.  SBO protocol in place.  Ongoing serial abdominal x-ray.    Abdominal x-ray done this morning personally reviewed showed persistent dilatation of small bowel loops consistent with small bowel obstruction without oral contrast in the descending colon.  05/18/21: Patient was seen and examined at the bedside.  He denies having any nausea.  Admits to diffuse abdominal pain.  Admits to flatulence but minimally.  Assessment/Plan: Principal Problem:   SBO (small bowel obstruction) (HCC) Active Problems:   Chronic atrial fibrillation (HCC)   Type 2 diabetes mellitus with neurologic complication, without long-term current use of insulin (HCC)   BMI 35.0-35.9,adult   Leukocytosis   Hypertensive urgency   UTI (urinary tract infection)   Normocytic anemia   History of lymphoma  Persistent SBO (small bowel obstruction) ([place])- (present on admission) Patient present with complaints of abdominal pain x1 day.   CT scan noted dilated proximal small bowel with decompressed distal small bowel consistent with SBO.   Notes prior history of several occurrences of the same stemming from  small bowel lymphoma back in 1990s requiring surgery and chemotherapy.   Last small bowel obstruction occurred back in 12/2019 and resolved with conservative management.   NG tube to suction. Gentle IV fluid hydration Goal potassium greater than 4.0, goal magnesium greater than 2.0. Check electrolytes daily. Monitor volume status daily. Mobilize as tolerated. Pain management in place.  DC PCA morphine, added IV Dilaudid 1 mg every 4 hours as needed for severe pain.  Limit use of opiates. Appreciate general surgery's assistance.  Presumptive UTI (urinary tract infection)- (present on admission) Patient reports that he has been having some mild burning with urination which is new.  Urinalysis was positive for small leukocytes, rare bacteria, and and 6-10 WBCs. Urine culture multiple species, suggest recollection. -Rocephin IV, since 05/17/2021.   Hypertensive urgency- (present on admission) On admission patient was noted to have blood pressure elevated up to 194/66. N.p.o. Add IV antihypertensive, IV Lopressor, with parameters. Prior to admission was on Coreg 3.125 mg twice daily, p.o. Lasix 20 mg twice daily. Continue to closely monitor vital signs.   Improving leukocytosis- (present on admission) Possibly reactive in the setting of small bowel obstruction versus presumptive UTI. On admission WBC elevated at 12.6.,  WBC downtrending 10.7. Continue to monitor WBC and fever curve.   Chronic atrial fibrillation ([place])- (present on admission) Patient appears to be relatively rate controlled.   Home medications include Pradaxa and Coreg 3.125 mg twice daily, held due to n.p.o. On heparin drip for CVA prevention while oral anticoagulation is being held. IV Lopressor as needed with parameters. Continue to monitor with remote telemetry.  Chronic systolic CHF Euvolemic on exam.  Last 2D echo done on 12/16/2019 showed LVEF 45 to 50% Closely monitor volume status while on IV fluid  hydration. Patient on p.o. Lasix 20 mg twice daily prior to admission, hold off diuretics in the setting of SBO. Oral cardiac medications on hold due to NPO. Continue strict I's and O's and daily weight   BMI 35.0-35.9,adult BMI 35.73 kg/m Recommend weight loss outpatient with regular physical activity and healthy dieting.   Type 2 diabetes mellitus with neurologic complication, without long-term current use of insulin ([place])- (present on admission) Hemoglobin A1c 7.5 on 03/09/2021. Hold off home oral hypoglycemics. Continue insulin sliding scale every 4 hours while NPO. Avoid hypoglycemia.   Critical care time: 65 minutes.     Advance Care Planning:   Code Status: Full Code    Consults: General surgery   Family Communication: Updated his daughter at bedside.    Code Status: Full code.    Disposition Plan: Likely will discharge to home once general surgery signs of.   Consultants: General surgery.  Procedures: NG tube placement.  Antimicrobials: Rocephin started on 05/17/2021 for presumptive UTI.  DVT prophylaxis: Heparin drip  Status is: Inpatient  Patient requires at least 2 midnights for further evaluation and treatment of present condition.      Objective: Vitals:   05/17/21 1956 05/18/21 0407 05/18/21 0934 05/18/21 1203  BP: (!) 173/73 (!) 165/89 (!) 168/68   Pulse: 79 73 81   Resp: 17 17 20 15   Temp: 97.9 F (36.6 C) 97.7 F (36.5 C) 97.7 F (36.5 C)   TempSrc: Oral Oral    SpO2: 97% 99% 98% 99%  Weight:  119.2 kg    Height:        Intake/Output Summary (Last 24 hours) at 05/18/2021 1339 Last data filed at 05/18/2021 1300 Gross per 24 hour  Intake 715.37 ml  Output 1850 ml  Net -1134.63 ml   Filed Weights   05/16/21 2142 05/17/21 0304 05/18/21 0407  Weight: 117.9 kg 116.2 kg 119.2 kg    Exam:  General: 86 y.o. year-old male well developed well nourished in no acute distress.  Alert and oriented x3. Cardiovascular: Irregular rate and  rhythm with no rubs or gallops.  No thyromegaly or JVD noted.   Respiratory: Clear to auscultation with no wheezes or rales. Good inspiratory effort. Abdomen: Distended with hypoactive bowel sounds.  Diffusely tender with palpation.   Musculoskeletal: Trace lower extremity edema bilaterally. Skin: No ulcerative lesions noted or rashes, Psychiatry: Mood is appropriate for condition and setting Neuro: Moves all 4 extremities.   Data Reviewed: CBC: Recent Labs  Lab 05/16/21 2159 05/18/21 0641  WBC 12.6* 10.7*  HGB 12.7* 12.7*  HCT 40.3 40.5  MCV 83.6 84.9  PLT 232 937   Basic Metabolic Panel: Recent Labs  Lab 05/16/21 2159 05/18/21 0641  NA 138 140  K 4.5 4.3  CL 102 101  CO2 24 26  GLUCOSE 170* 189*  BUN 20 19  CREATININE 1.12 1.13  CALCIUM 9.9 9.0  MG  --  2.2  PHOS  --  3.6   GFR: Estimated Creatinine Clearance: 65.1 mL/min (by C-G formula based on SCr of 1.13 mg/dL). Liver Function Tests: Recent Labs  Lab 05/16/21 2159  AST 16  ALT 11  ALKPHOS 48  BILITOT 0.9  PROT 7.6  ALBUMIN 4.5   Recent Labs  Lab 05/16/21 2159  LIPASE 21   No results for input(s): AMMONIA in the last 168 hours. Coagulation Profile: No results for  input(s): INR, PROTIME in the last 168 hours. Cardiac Enzymes: No results for input(s): CKTOTAL, CKMB, CKMBINDEX, TROPONINI in the last 168 hours. BNP (last 3 results) No results for input(s): PROBNP in the last 8760 hours. HbA1C: No results for input(s): HGBA1C in the last 72 hours. CBG: Recent Labs  Lab 05/17/21 0331 05/17/21 1627 05/17/21 2221 05/18/21 0019 05/18/21 0928  GLUCAP 189* 170* 215* 188* 184*   Lipid Profile: No results for input(s): CHOL, HDL, LDLCALC, TRIG, CHOLHDL, LDLDIRECT in the last 72 hours. Thyroid Function Tests: No results for input(s): TSH, T4TOTAL, FREET4, T3FREE, THYROIDAB in the last 72 hours. Anemia Panel: No results for input(s): VITAMINB12, FOLATE, FERRITIN, TIBC, IRON, RETICCTPCT in the last  72 hours. Urine analysis:    Component Value Date/Time   COLORURINE YELLOW 05/16/2021 2159   APPEARANCEUR CLEAR 05/16/2021 2159   LABSPEC 1.021 05/16/2021 2159   PHURINE 5.0 05/16/2021 2159   GLUCOSEU NEGATIVE 05/16/2021 2159   GLUCOSEU >=1000 (A) 11/09/2020 0757   HGBUR NEGATIVE 05/16/2021 2159   [place] NEGATIVE 05/16/2021 2159   [place] NEGATIVE 05/16/2021 2159   PROTEINUR 30 (A) 05/16/2021 2159   UROBILINOGEN 0.2 11/09/2020 0757   NITRITE NEGATIVE 05/16/2021 2159   LEUKOCYTESUR SMALL (A) 05/16/2021 2159   Sepsis Labs: @LABRCNTIP (procalcitonin:4,lacticidven:4)  ) Recent Results (from the past 240 hour(s))  Urine Culture     Status: Abnormal   Collection Time: 05/17/21  1:46 PM   Specimen: Urine, Clean Catch  Result Value Ref Range Status   Specimen Description URINE, CLEAN CATCH  Final   Special Requests   Final    NONE Performed at [facility], [place] [address], [place], [place] [address]    Culture MULTIPLE SPECIES PRESENT, SUGGEST RECOLLECTION (A)  Final   Report Status 05/18/2021 FINAL  Final      Studies: DG Abd Portable 1V  Result Date: 05/18/2021 CLINICAL DATA:  Abdominal pain since last Friday EXAM: PORTABLE ABDOMEN - 1 VIEW COMPARISON:  05/17/2021 FINDINGS: Persistent dilatation of small bowel loops consistent with small bowel obstruction. Some gas and stool within colon. Excreted contrast within urinary bladder. No definite bowel wall thickening. Osseous structures unremarkable. Surgical clips RIGHT upper quadrant consistent with history of cholecystectomy. IMPRESSION: Persistent small bowel obstruction. Electronically Signed   By: Lavonia Dana M.D.   On: 05/18/2021 08:55   DG Abd Portable 1V-Small Bowel Obstruction Protocol-initial, 8 hr delay  Result Date: 05/17/2021 CLINICAL DATA:  Small-bowel obstruction EXAM: PORTABLE ABDOMEN - 1 VIEW COMPARISON:  05/17/2021 FINDINGS: Nasogastric tube tip overlies the proximal body of the stomach. Multiple  gas-filled dilated loops of small bowel are seen within the mid abdomen in keeping with a mid to distal small bowel obstruction. No free intraperitoneal gas. IMPRESSION: Nasogastric tube tip within the proximal body of the stomach. Mid to distal small-bowel obstruction. Electronically Signed   By: Fidela Salisbury M.D.   On: 05/17/2021 22:35    Scheduled Meds:  insulin aspart  0-6 Units Subcutaneous Q6H   mouth rinse  15 mL Mouth Rinse BID   morphine   Intravenous Q4H   pantoprazole (PROTONIX) IV  40 mg Intravenous Q12H   sodium chloride flush  3 mL Intravenous Q12H    Continuous Infusions:  sodium chloride Stopped (05/17/21 1426)   acetaminophen 1,000 mg (05/18/21 1211)   cefTRIAXone (ROCEPHIN)  IV Stopped (05/17/21 1514)   heparin 1,900 Units/hr (05/18/21 0748)     LOS: 1 day     Kayleen Memos, MD [facility]  Pager [phone]  If 7PM-7AM, please contact night-coverage [email or web address] Password [email or web address] 05/18/2021, 1:39 PM

## 2021-05-18 NOTE — Progress Notes (Addendum)
ANTICOAGULATION CONSULT NOTE  Pharmacy Consult: IV Heparin Indication: atrial fibrillation  Allergies  Allergen Reactions   Atorvastatin Other (See Comments)    Dizziness.   Jardiance [Empagliflozin] Other (See Comments)    Orthostasis   Sulfa Antibiotics    Ace Inhibitors Rash   Amoxicillin Rash   Meperidine Rash   Meperidine Hcl Rash   Naproxen Sodium Rash   Sulfamethoxazole Rash   Tetracycline Rash    Patient Measurements: Height: 5\' 11"  (180.3 cm) Weight: 119.2 kg (262 lb 12.6 oz) IBW/kg (Calculated) : 75.3 Heparin Dosing Weight: 100 kg  Vital Signs: Temp: 97.7 F (36.5 C) (02/10 0407) Temp Source: Oral (02/10 0407) BP: 165/89 (02/10 0407) Pulse Rate: 73 (02/10 0407)  Labs: Recent Labs    05/16/21 2159 05/17/21 2054 05/18/21 0641  HGB 12.7*  --   --   HCT 40.3  --   --   PLT 232  --   --   HEPARINUNFRC  --  <0.10* 0.29*  CREATININE 1.12  --  1.13    Estimated Creatinine Clearance: 65.1 mL/min (by C-G formula based on SCr of 1.13 mg/dL).  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   Arthritis 03/12/2016   Bilateral hand pain 03/25/2019   Blepharitis of both upper and lower eyelid 10/02/2018   Carpal tunnel syndrome of right wrist 03/25/2019   Chronic anticoagulation 10/11/2016   Chronic atrial fibrillation ([place]) 06/12/2015   Overview:  Managed CARDS   Chronic diastolic heart failure ([place]) 09/01/2015   Coronary artery disease involving native coronary artery of native heart with angina pectoris ([place]) 02/15/2015   Overview:  Hx of MI with stent to LAD 2002 MPS 2016 with EF 47% and no ischemia   Dermatochalasis of both upper eyelids 11/21/2016   Dermatochalasis of right upper eyelid 10/02/2018   Diabetic polyneuropathy associated with type 2 diabetes mellitus ([place]) 12/16/2018   2020   Dyslipidemia 11/07/2017   Essential hypertension  11/07/2017   Ganglion cyst 11/27/2015   Overview:  2017: dorsum right hand   Gastroesophageal reflux disease without esophagitis 06/12/2015   Hyperlipidemia 04/11/2016   Hypertensive heart disease with heart failure ([place]) 02/15/2015   Idiopathic chronic gout 05/17/2015   Insomnia 11/27/2015   Leukocytosis 04/11/2016   Lightheadedness 10/11/2016   Long term current use of oral hypoglycemic drug 10/02/2018   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[place]) 08/30/2015   Male erectile disorder 06/17/2016   Meibomian gland dysfunction (MGD) of both eyes 10/02/2018   MI (myocardial infarction) ([place]) 02/15/2015   Overview:  2002   Obesity 04/11/2016   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 exacerbation ([place]) 06/12/2015   Obstructive sleep apnea 08/30/2015   Overview:  CPAP   Old MI (myocardial infarction)    Orthostatic dizziness 09/15/2017   2018: med related   Permanent atrial fibrillation ([place]) 06/12/2015   Overview:  Managed CARDS   Pseudophakia of both eyes 03/12/2016   Pulmonary hypertension ([place]) 08/30/2015   Overview:  Managed CARDS   Purulent inflammation of skin 12/29/2018   Restless leg syndrome 08/30/2015   Restrictive lung disease 12/26/2016   Overview:  2018: mod on spirometry   SBO (small bowel obstruction) ([place]) 12/14/2019   Senile nuclear sclerosis 05/02/2016   Small bowel obstruction ([place]) 04/11/2016   SOB (shortness of breath) 08/14/2015   Type 2 diabetes mellitus with neurologic complication, without long-term current use of insulin ([place]) 12/02/2014  Unilateral inguinal hernia without obstruction or gangrene 06/12/2015    Assessment: 86 yr old man presented with abdominal pain and was found to have SBO, likely due to intra-abdominal adhesions in setting of multiple abdominal surgeries.  Pharmacy was consulted to transition patient from Pradaxa (last dose 2/8 AM) to IV heparin for history of Afib.  Baseline labs reviewed.  Heparin level slightly subtherapeutic at 0.29 this morning; however, per discussion with RN,  rate was not increased to 1900 units/hr until 0748 this morning (after AM heparin level). H/H 12.7/40.3, plt 232 (last 2/8). Per RN, no issues with IV or bleeding observed.  Goal of Therapy:  Heparin level 0.3-0.7 units/ml Monitor platelets by anticoagulation protocol: Yes   Plan:  Continue heparin infusion at 1900 units/hr  Check 8-hr heparin level from when rate change occurred Monitor daily heparin level and CBC (still pending for today), s/sx bleeding   Arturo Morton, PharmD, BCPS Please check AMION for all [place] contact numbers Clinical Pharmacist 05/18/2021 8:15 AM

## 2021-05-18 NOTE — Progress Notes (Signed)
ANTICOAGULATION CONSULT NOTE  Pharmacy Consult: IV Heparin Indication: atrial fibrillation  Allergies  Allergen Reactions   Atorvastatin Other (See Comments)    Dizziness.   Jardiance [Empagliflozin] Other (See Comments)    Orthostasis   Sulfa Antibiotics    Ace Inhibitors Rash   Amoxicillin Rash   Meperidine Rash   Meperidine Hcl Rash   Naproxen Sodium Rash   Sulfamethoxazole Rash   Tetracycline Rash    Patient Measurements: Height: 5\' 11"  (180.3 cm) Weight: 119.2 kg (262 lb 12.6 oz) IBW/kg (Calculated) : 75.3 Heparin Dosing Weight: 100 kg  Vital Signs: Temp: 98.2 F (36.8 C) (02/10 1534) Temp Source: Oral (02/10 1534) BP: 155/63 (02/10 1534) Pulse Rate: 84 (02/10 1534)  Labs: Recent Labs    05/16/21 2159 05/17/21 2054 05/18/21 0641 05/18/21 1557  HGB 12.7*  --  12.7*  --   HCT 40.3  --  40.5  --   PLT 232  --  228  --   HEPARINUNFRC  --  <0.10* 0.29* 0.57  CREATININE 1.12  --  1.13  --     Estimated Creatinine Clearance: 65.1 mL/min (by C-G formula based on SCr of 1.13 mg/dL).  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   Arthritis 03/12/2016   Bilateral hand pain 03/25/2019   Blepharitis of both upper and lower eyelid 10/02/2018   Carpal tunnel syndrome of right wrist 03/25/2019   Chronic anticoagulation 10/11/2016   Chronic atrial fibrillation ([place]) 06/12/2015   Overview:  Managed CARDS   Chronic diastolic heart failure ([place]) 09/01/2015   Coronary artery disease involving native coronary artery of native heart with angina pectoris ([place]) 02/15/2015   Overview:  Hx of MI with stent to LAD 2002 MPS 2016 with EF 47% and no ischemia   Dermatochalasis of both upper eyelids 11/21/2016   Dermatochalasis of right upper eyelid 10/02/2018   Diabetic polyneuropathy associated with type 2 diabetes mellitus ([place]) 12/16/2018   2020    Dyslipidemia 11/07/2017   Essential hypertension 11/07/2017   Ganglion cyst 11/27/2015   Overview:  2017: dorsum right hand   Gastroesophageal reflux disease without esophagitis 06/12/2015   Hyperlipidemia 04/11/2016   Hypertensive heart disease with heart failure ([place]) 02/15/2015   Idiopathic chronic gout 05/17/2015   Insomnia 11/27/2015   Leukocytosis 04/11/2016   Lightheadedness 10/11/2016   Long term current use of oral hypoglycemic drug 10/02/2018   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[place]) 08/30/2015   Male erectile disorder 06/17/2016   Meibomian gland dysfunction (MGD) of both eyes 10/02/2018   MI (myocardial infarction) ([place]) 02/15/2015   Overview:  2002   Obesity 04/11/2016   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 exacerbation ([place]) 06/12/2015   Obstructive sleep apnea 08/30/2015   Overview:  CPAP   Old MI (myocardial infarction)    Orthostatic dizziness 09/15/2017   2018: med related   Permanent atrial fibrillation ([place]) 06/12/2015   Overview:  Managed CARDS   Pseudophakia of both eyes 03/12/2016   Pulmonary hypertension ([place]) 08/30/2015   Overview:  Managed CARDS   Purulent inflammation of skin 12/29/2018   Restless leg syndrome 08/30/2015   Restrictive lung disease 12/26/2016   Overview:  2018: mod on spirometry   SBO (small bowel obstruction) ([place]) 12/14/2019   Senile nuclear sclerosis 05/02/2016   Small bowel obstruction ([place]) 04/11/2016   SOB (shortness of breath) 08/14/2015   Type 2 diabetes mellitus with neurologic  complication, without long-term current use of insulin ([place]) 12/02/2014   Unilateral inguinal hernia without obstruction or gangrene 06/12/2015    Assessment: 86 yr old man presented with abdominal pain and was found to have SBO, likely due to intra-abdominal adhesions in setting of multiple abdominal surgeries.  Pharmacy was consulted to transition patient from Pradaxa (last dose 2/8 AM) to IV heparin for history of Afib.  Baseline labs reviewed.  Heparin level this afternoon came back  therapeutic at 0.57, on 19000 units/hr. CBC stable. No s/sx of bleeding or infusion issues per nursing.   Goal of Therapy:  Heparin level 0.3-0.7 units/ml Monitor platelets by anticoagulation protocol: Yes   Plan:  Continue heparin infusion at 1900 units/hr  Monitor daily heparin level and CBC, s/sx bleeding  Antonietta Jewel, PharmD, [place] Pharmacist  Phone: [phone] 05/18/2021 4:56 PM  Please check AMION for all [place] phone numbers After 10:00 PM, call [place] [phone]

## 2021-05-18 NOTE — Progress Notes (Signed)
[facility] Progress Note     Subjective: CC:  Increased pain this AM. Denies flatus or BM, though there is a BM documented on epic. NG was partially pulled out when I walked in - I re-advanced. NG tube has been clogged/not working normally for many hours, per patients daughter. He is now on a morphine PCA.   Objective: Vital signs in last 24 hours: Temp:  [97.7 F (36.5 C)-97.9 F (36.6 C)] 97.7 F (36.5 C) (02/10 0407) Pulse Rate:  [68-79] 73 (02/10 0407) Resp:  [16-20] 17 (02/10 0407) BP: (165-173)/(66-100) 165/89 (02/10 0407) SpO2:  [95 %-99 %] 99 % (02/10 0407) Weight:  [619.5 kg] 119.2 kg (02/10 0407) Last BM Date: 05/16/21  Intake/Output from previous day: 02/09 0701 - 02/10 0700 In: 321.1 [I.V.:221.1; IV Piggyback:100] Out: 400 [Emesis/NG output:400] Intake/Output this shift: No intake/output data recorded.  PE: Gen:  Alert, NAD, pleasant Card:  Regular rate and rhythm, pedal pulses 2+ BL Pulm:  Normal effort, clear to auscultation bilaterally Abd: Soft, moderate distention, global abdominal tenderness, worse over umbilicus, without peritonitis, hypoactive bowel sounds; ng tube flushed my me with immediate retrieval of 600 cc bilious effluent. Skin: warm and dry, no rashes  Psych: A&Ox3   Lab Results:  Recent Labs    05/16/21 2159  WBC 12.6*  HGB 12.7*  HCT 40.3  PLT 232   BMET Recent Labs    05/16/21 2159 05/18/21 0641  NA 138 140  K 4.5 4.3  CL 102 101  CO2 24 26  GLUCOSE 170* 189*  BUN 20 19  CREATININE 1.12 1.13  CALCIUM 9.9 9.0   PT/INR No results for input(s): LABPROT, INR in the last 72 hours. CMP     Component Value Date/Time   NA 140 05/18/2021 0641   NA 141 06/03/2019 1543   K 4.3 05/18/2021 0641   CL 101 05/18/2021 0641   CO2 26 05/18/2021 0641   GLUCOSE 189 (H) 05/18/2021 0641   BUN 19 05/18/2021 0641   BUN 25 06/03/2019 1543   CREATININE 1.13 05/18/2021 0641   CALCIUM 9.0 05/18/2021 0641   PROT 7.6 05/16/2021  2159   ALBUMIN 4.5 05/16/2021 2159   AST 16 05/16/2021 2159   ALT 11 05/16/2021 2159   ALKPHOS 48 05/16/2021 2159   BILITOT 0.9 05/16/2021 2159   GFRNONAA >60 05/18/2021 0641   GFRAA >60 12/17/2019 0456   Lipase     Component Value Date/Time   LIPASE 21 05/16/2021 2159       Studies/Results: CT ABDOMEN PELVIS W CONTRAST  Result Date: 05/16/2021 CLINICAL DATA:  Acute nonlocalized abdominal pain. Lower abdominal pain. EXAM: CT ABDOMEN AND PELVIS WITH CONTRAST TECHNIQUE: Multidetector CT imaging of the abdomen and pelvis was performed using the standard protocol following bolus administration of intravenous contrast. RADIATION DOSE REDUCTION: This exam was performed according to the departmental dose-optimization program which includes automated exposure control, adjustment of the mA and/or kV according to patient size and/or use of iterative reconstruction technique. CONTRAST:  150mL OMNIPAQUE IOHEXOL 300 MG/ML  SOLN COMPARISON:  12/14/2019 FINDINGS: Lower chest: Lung bases are clear. Mild cardiac enlargement. Coronary artery calcifications. Hepatobiliary: No focal liver abnormality is seen. Status post cholecystectomy. No biliary dilatation. Pancreas: Unremarkable. No pancreatic ductal dilatation or surrounding inflammatory changes. Spleen: Normal in size without focal abnormality. Adrenals/Urinary Tract: Adrenal glands are unremarkable. Kidneys are normal, without renal calculi, focal lesion, or hydronephrosis. Bladder is unremarkable. Stomach/Bowel: Stomach is unremarkable. Mid abdominal small bowel anastomosis. Proximal small  bowel are dilated and fluid-filled with air-fluid levels. Distal small bowel are decompressed. Transition zone appears to be in the right anterior lower quadrant. Some fecalization of small bowel contents may indicate longstanding stasis. Colon is not abnormally distended. Colonic diverticula without evidence of diverticulitis. Appendix is not seen. Small amount of free  fluid in the mesentery. Vascular/Lymphatic: Aortic atherosclerosis. No enlarged abdominal or pelvic lymph nodes. Reproductive: Prostate is unremarkable. Other: Postoperative changes along the midline. No free air or free fluid in the abdomen. Musculoskeletal: Degenerative changes in the spine. IMPRESSION: Dilated proximal small bowel with decompressed distal small bowel consistent with small bowel obstruction. Changes likely to represent adhesions in the setting of previous abdominal surgery. Electronically Signed   By: Lucienne Capers M.D.   On: 05/16/2021 23:25   DG Chest Portable 1 View  Result Date: 05/17/2021 CLINICAL DATA:  Nasogastric tube placement EXAM: PORTABLE CHEST 1 VIEW COMPARISON:  None. FINDINGS: Nasogastric tube is in place with its tip extending into the proximal body of the stomach. Lungs are clear. No pneumothorax or pleural effusion. Cardiac size within normal limits. No acute bone abnormality. IMPRESSION: Nasogastric tube tip within the proximal body of the stomach. Electronically Signed   By: Fidela Salisbury M.D.   On: 05/17/2021 00:46   DG Abd Portable 1V-Small Bowel Obstruction Protocol-initial, 8 hr delay  Result Date: 05/17/2021 CLINICAL DATA:  Small-bowel obstruction EXAM: PORTABLE ABDOMEN - 1 VIEW COMPARISON:  05/17/2021 FINDINGS: Nasogastric tube tip overlies the proximal body of the stomach. Multiple gas-filled dilated loops of small bowel are seen within the mid abdomen in keeping with a mid to distal small bowel obstruction. No free intraperitoneal gas. IMPRESSION: Nasogastric tube tip within the proximal body of the stomach. Mid to distal small-bowel obstruction. Electronically Signed   By: Fidela Salisbury M.D.   On: 05/17/2021 22:35   DG Abd Portable 1V  Result Date: 05/17/2021 CLINICAL DATA:  SBO EXAM: PORTABLE ABDOMEN - 1 VIEW COMPARISON:  Radiograph dated December 16, 2019 FINDINGS: NG tube tip is in the proximal stomach with side port in the distal esophagus, recommend  advancement. Dilated gas-filled loops of small bowel, compatible with history of small-bowel obstruction. IMPRESSION: NG tube tip is in the proximal stomach with side port in the distal esophagus, recommend advancement. Electronically Signed   By: Yetta Glassman M.D.   On: 05/17/2021 09:04    Anti-infectives: Anti-infectives (From admission, onward)    Start     Dose/Rate Route Frequency Ordered Stop   05/17/21 1400  cefTRIAXone (ROCEPHIN) 2 g in sodium chloride 0.9 % 100 mL IVPB        2 g 200 mL/hr over 30 Minutes Intravenous Every 24 hours 05/17/21 1314          Assessment/Plan  SBO, likely due to intra-abdominal adhesions in the setting of mult abdominal surgeries  - afebrile, vitals stable - CT abd pelvis 2/8 read as SBO with distal transition zone. There are no radiographic or clinical signs of a closed loop obstruction. - SBO protocol 2/9 >> KUB today with ongoing obstructive pattern - NG has not been working, clinically the patient remains obstructed, NG seems to be working now. Continue NG to LIWS and monitor. No emergent surgical needs today. Failure to improve with non-operative managemet may warrant laparotomy in the next  few days.    CHF CAD s/p PCI 2002 A.fib on pradaxa - hold pradaxa  DM HTN Obesity      LOS: 1 day   I  reviewed nursing notes, hospitalist notes, last 24 h vitals and pain scores, last 48 h intake and output, last 24 h labs and trends, and last 24 h imaging results.  This care required high  level of medical decision making.   Obie Dredge, PA-C [facility] Please see Amion for pager number during day hours 7:00am-4:30pm

## 2021-05-18 NOTE — Progress Notes (Signed)
Patients Morphine PCA discontinued this shift at 1430 wasted the morphine (15 mL) with Berton Mount in steri-cycle at 1440

## 2021-05-18 NOTE — Evaluation (Signed)
Occupational Therapy Evaluation Patient Details Name: Shawn Blanchard MRN: [id number] DOB: [birth year] Today's Date: 05/18/2021   History of Present Illness Pt is an 86 year old man admitted on 05/16/21 with SBO and UTI. 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   Clinical Impression   Pt walks with either a cane or a rollator and is independent in self care at home. His wife and daughter manage meal prep and housekeeping. Pt presents with impaired standing balance and some impulsivity. He has a h/o dizziness and falls and cues himself to sing [employer] [place] prior to ambulating. Pt needs up to min assist for ADL and mobility. He is likely to progress well and not require post acute OT.      Recommendations for follow up therapy are one component of a multi-disciplinary discharge planning process, led by the attending physician.  Recommendations may be updated based on patient status, additional functional criteria and insurance authorization.   Follow Up Recommendations  No OT follow up    Assistance Recommended at Discharge Intermittent Supervision/Assistance  Patient can return home with the following A little help with walking and/or transfers;A little help with bathing/dressing/bathroom;Direct supervision/assist for medications management;Direct supervision/assist for financial management;Assist for transportation;Help with stairs or ramp for entrance    Functional Status Assessment  Patient has had a recent decline in their functional status and demonstrates the ability to make significant improvements in function in a reasonable and predictable amount of time.  Equipment Recommendations  None recommended by OT    Recommendations for Other Services       Precautions / Restrictions Precautions Precautions: Fall Precaution Comments: h/o dizziness and falls,  NGT      Mobility Bed Mobility Overal bed mobility: Needs Assistance Bed Mobility: Supine to Sit     Supine to sit: Min assist     General bed mobility comments: assist to raise trunk    Transfers Overall transfer level: Needs assistance Equipment used: Rolling walker (2 wheels) Transfers: Sit to/from Stand Sit to Stand: Min guard           General transfer comment: pulls up on walker      Balance Overall balance assessment: Needs assistance   Sitting balance-Leahy Scale: Good       Standing balance-Leahy Scale: Poor Standing balance comment: reliant on RW                           ADL either performed or assessed with clinical judgement   ADL Overall ADL's : Needs assistance/impaired Eating/Feeding: Set up;Sitting (currently NPO)   Grooming: Min guard;Standing;Wash/dry hands   Upper Body Bathing: Minimal assistance;Sitting   Lower Body Bathing: Minimal assistance;Sit to/from stand   Upper Body Dressing : Set up;Sitting   Lower Body Dressing: Minimal assistance;Sit to/from stand   Toilet Transfer: Minimal assistance;Ambulation;Comfort height toilet;Grab bars           Functional mobility during ADLs: Minimal assistance;Rolling walker (2 wheels)       Vision Baseline Vision/History: 1 Wears glasses Ability to See in Adequate Light: 0 Adequate Patient Visual Report: No change from baseline       Perception     Praxis      Pertinent Vitals/Pain Pain Assessment Pain Assessment: No/denies pain     Hand Dominance Right   Extremity/Trunk Assessment Upper Extremity Assessment Upper Extremity Assessment: Overall  WFL for tasks assessed   Lower Extremity Assessment Lower Extremity Assessment: Defer to PT evaluation   Cervical / Trunk Assessment Cervical / Trunk Assessment: Other exceptions (obesity)   Communication Communication Communication: No difficulties   Cognition Arousal/Alertness: Awake/alert Behavior During  Therapy: Impulsive Overall Cognitive Status: Impaired/Different from baseline Area of Impairment: Safety/judgement                         Safety/Judgement: Decreased awareness of safety, Decreased awareness of deficits     General Comments: sings the [employer] prior to walking to make sure he is not dizzy     General Comments       Exercises     Shoulder Instructions      Home Living Family/patient expects to be discharged to:: Private residence Living Arrangements: Spouse/significant other Available Help at Discharge: Family;Available 24 hours/day Type of Home: House Home Access: Stairs to enter [employer] of Steps: 2   Home Layout: One level     Bathroom Shower/Tub: Occupational psychologist: Handicapped height     Home Equipment: Rollator (4 wheels);Cane - single point          Prior Functioning/Environment Prior Level of Function : Independent/Modified Independent             Mobility Comments: walks with either a cane or a rollator ADLs Comments: independent in self care, assisted for IADLs        OT Problem List: Impaired balance (sitting and/or standing);Decreased knowledge of use of DME or AE;Obesity      OT Treatment/Interventions: Self-care/ADL training;DME and/or AE instruction;Therapeutic activities;Patient/family education;Balance training    OT Goals(Current goals can be found in the care plan section) Acute Rehab OT Goals OT Goal Formulation: With patient Time For Goal Achievement: 06/01/21 Potential to Achieve Goals: Good ADL Goals Pt Will Perform Grooming: standing;with supervision Pt Will Perform Lower Body Bathing: with supervision;sit to/from stand Pt Will Perform Lower Body Dressing: with supervision;sit to/from stand Pt Will Transfer to Toilet: with supervision;ambulating Pt Will Perform Toileting - Clothing Manipulation and hygiene: with supervision;sit to/from stand Additional ADL Goal  #1: Pt will perform bed mobility modified independently.  OT Frequency: Min 2X/week    Co-evaluation              AM-PAC OT "6 Clicks" Daily Activity     Outcome Measure Help from another person eating meals?: None Help from another person taking care of personal grooming?: A Little Help from another person toileting, which includes using toliet, bedpan, or urinal?: A Little Help from another person bathing (including washing, rinsing, drying)?: A Little Help from another person to put on and taking off regular upper body clothing?: A Little Help from another person to put on and taking off regular lower body clothing?: A Little 6 Click Score: 19   End of Session Equipment Utilized During Treatment: Rolling walker (2 wheels);Gait belt Nurse Communication: Mobility status;Other (comment) (RN managed NGT)  Activity Tolerance: Patient tolerated treatment well Patient left: Other (comment);with family/visitor present (on toilet with call button)  OT Visit Diagnosis: Unsteadiness on feet (R26.81);Other abnormalities of gait and mobility (R26.89)                Time: 1353-1420 OT Time Calculation (min): 27 min Charges:  OT General Charges $OT Visit: 1 Visit OT Evaluation $OT Eval Moderate Complexity: 1 Mod OT Treatments $Self Care/Home Management : 8-22 mins  Nestor Lewandowsky, OTR/L Acute  Rehabilitation Services Pager: [phone] Office: [phone]  Malka So 05/18/2021, 2:39 PM

## 2021-05-18 NOTE — Progress Notes (Signed)
Patients morphine PCA was discontinued earlier this shift around 1430. The morphine syringe had 15 ml remaining and was wasted in the steri-cycle at 1440 with Assistant Director -  Arlice Colt, RN as witness.

## 2021-05-18 NOTE — Progress Notes (Signed)
PT Cancellation Note  Patient Details Name: Shawn Blanchard MRN: [id number] DOB: 07-25-1935   Cancelled Treatment:    Reason Eval/Treat Not Completed: Other (comment).  Declined to let PT see him today, retry at another time.   Ramond Dial 05/18/2021, 11:37 AM  Mee Hives, PT PhD Acute Rehab Dept. Number: [place] [facility] [place]

## 2021-05-19 ENCOUNTER — Inpatient Hospital Stay (HOSPITAL_COMMUNITY): Payer: Medicare PPO

## 2021-05-19 LAB — CBC
HCT: 38.5 % — ABNORMAL LOW (ref 39.0–52.0)
Hemoglobin: 12.2 g/dL — ABNORMAL LOW (ref 13.0–17.0)
MCH: 27.1 pg (ref 26.0–34.0)
MCHC: 31.7 g/dL (ref 30.0–36.0)
MCV: 85.6 fL (ref 80.0–100.0)
Platelets: 192 10*3/uL (ref 150–400)
RBC: 4.5 MIL/uL (ref 4.22–5.81)
RDW: 15.6 % — ABNORMAL HIGH (ref 11.5–15.5)
WBC: 8.7 10*3/uL (ref 4.0–10.5)
nRBC: 0 % (ref 0.0–0.2)

## 2021-05-19 LAB — BASIC METABOLIC PANEL
Anion gap: 12 (ref 5–15)
BUN: 20 mg/dL (ref 8–23)
CO2: 25 mmol/L (ref 22–32)
Calcium: 8.6 mg/dL — ABNORMAL LOW (ref 8.9–10.3)
Chloride: 103 mmol/L (ref 98–111)
Creatinine, Ser: 1.22 mg/dL (ref 0.61–1.24)
GFR, Estimated: 59 mL/min — ABNORMAL LOW (ref >60)
Glucose, Bld: 153 mg/dL — ABNORMAL HIGH (ref 70–99)
Potassium: 4.2 mmol/L (ref 3.5–5.1)
Sodium: 140 mmol/L (ref 135–145)

## 2021-05-19 LAB — GLUCOSE, CAPILLARY
Glucose-Capillary: 134 mg/dL — ABNORMAL HIGH (ref 70–99)
Glucose-Capillary: 136 mg/dL — ABNORMAL HIGH (ref 70–99)
Glucose-Capillary: 148 mg/dL — ABNORMAL HIGH (ref 70–99)
Glucose-Capillary: 162 mg/dL — ABNORMAL HIGH (ref 70–99)
Glucose-Capillary: 171 mg/dL — ABNORMAL HIGH (ref 70–99)
Glucose-Capillary: 173 mg/dL — ABNORMAL HIGH (ref 70–99)

## 2021-05-19 LAB — HEPARIN LEVEL (UNFRACTIONATED): Heparin Unfractionated: 0.63 IU/mL (ref 0.30–0.70)

## 2021-05-19 MED ORDER — BISACODYL 10 MG RE SUPP
10.0000 mg | Freq: Once | RECTAL | Status: AC
  Administered 2021-05-19: 10 mg via RECTAL
  Filled 2021-05-19: qty 1

## 2021-05-19 NOTE — Progress Notes (Signed)
ANTICOAGULATION CONSULT NOTE  Pharmacy Consult: IV Heparin Indication: atrial fibrillation  Allergies  Allergen Reactions   Atorvastatin Other (See Comments)    Dizziness.   Jardiance [Empagliflozin] Other (See Comments)    Orthostasis   Sulfa Antibiotics    Ace Inhibitors Rash   Amoxicillin Rash   Meperidine Rash   Meperidine Hcl Rash   Naproxen Sodium Rash   Sulfamethoxazole Rash   Tetracycline Rash    Patient Measurements: Height: 5\' 11"  (180.3 cm) Weight: 120 kg (264 lb 8.8 oz) IBW/kg (Calculated) : 75.3 Heparin Dosing Weight: 100 kg  Vital Signs: Temp: 97.8 F (36.6 C) (02/11 0400) Temp Source: Oral (02/11 0400) BP: 150/62 (02/11 0400) Pulse Rate: 79 (02/11 0400)  Labs: Recent Labs    05/16/21 2159 05/17/21 2054 05/18/21 0641 05/18/21 1557 05/19/21 0208  HGB 12.7*  --  12.7*  --  12.2*  HCT 40.3  --  40.5  --  38.5*  PLT 232  --  228  --  192  HEPARINUNFRC  --    < > 0.29* 0.57 0.63  CREATININE 1.12  --  1.13  --   --    < > = values in this interval not displayed.    Estimated Creatinine Clearance: 65.3 mL/min (by C-G formula based on SCr of 1.13 mg/dL).  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   Arthritis 03/12/2016   Bilateral hand pain 03/25/2019   Blepharitis of both upper and lower eyelid 10/02/2018   Carpal tunnel syndrome of right wrist 03/25/2019   Chronic anticoagulation 10/11/2016   Chronic atrial fibrillation ([place]) 06/12/2015   Overview:  Managed CARDS   Chronic diastolic heart failure ([place]) 09/01/2015   Coronary artery disease involving native coronary artery of native heart with angina pectoris ([place]) 02/15/2015   Overview:  Hx of MI with stent to LAD 2002 MPS 2016 with EF 47% and no ischemia   Dermatochalasis of both upper eyelids 11/21/2016   Dermatochalasis of right upper eyelid 10/02/2018   Diabetic  polyneuropathy associated with type 2 diabetes mellitus ([place]) 12/16/2018   2020   Dyslipidemia 11/07/2017   Essential hypertension 11/07/2017   Ganglion cyst 11/27/2015   Overview:  2017: dorsum right hand   Gastroesophageal reflux disease without esophagitis 06/12/2015   Hyperlipidemia 04/11/2016   Hypertensive heart disease with heart failure ([place]) 02/15/2015   Idiopathic chronic gout 05/17/2015   Insomnia 11/27/2015   Leukocytosis 04/11/2016   Lightheadedness 10/11/2016   Long term current use of oral hypoglycemic drug 10/02/2018   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[place]) 08/30/2015   Male erectile disorder 06/17/2016   Meibomian gland dysfunction (MGD) of both eyes 10/02/2018   MI (myocardial infarction) ([place]) 02/15/2015   Overview:  2002   Obesity 04/11/2016   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 exacerbation ([place]) 06/12/2015   Obstructive sleep apnea 08/30/2015   Overview:  CPAP   Old MI (myocardial infarction)    Orthostatic dizziness 09/15/2017   2018: med related   Permanent atrial fibrillation ([place]) 06/12/2015   Overview:  Managed CARDS   Pseudophakia of both eyes 03/12/2016   Pulmonary hypertension ([place]) 08/30/2015   Overview:  Managed CARDS   Purulent inflammation of skin 12/29/2018   Restless leg syndrome 08/30/2015   Restrictive lung disease 12/26/2016   Overview:  2018: mod on spirometry   SBO (small bowel obstruction) ([place]) 12/14/2019   Senile nuclear sclerosis  05/02/2016   Small bowel obstruction ([place]) 04/11/2016   SOB (shortness of breath) 08/14/2015   Type 2 diabetes mellitus with neurologic complication, without long-term current use of insulin ([place]) 12/02/2014   Unilateral inguinal hernia without obstruction or gangrene 06/12/2015    Assessment: 86 yr old man presented with abdominal pain and was found to have SBO, likely due to intra-abdominal adhesions in setting of multiple abdominal surgeries.  Pharmacy was consulted to transition patient from Pradaxa (last dose 2/8 AM) to IV heparin for history of  Afib.  Baseline labs reviewed.  Heparin level therapeutic at 0.63, on 1900 units/hr. CBC stable. No s/sx of bleeding or infusion issues per nursing.   Goal of Therapy:  Heparin level 0.3-0.7 units/ml Monitor platelets by anticoagulation protocol: Yes   Plan:  Continue heparin infusion at 1900 units/hr  Monitor daily heparin level and CBC, s/sx bleeding  Lestine Box, PharmD PGY2 Infectious Diseases Pharmacy Resident   Please check [email or web address] for unit-specific pharmacy phone numbers

## 2021-05-19 NOTE — Progress Notes (Addendum)
[facility] Progress Note     Subjective: Feeling much better from yesterday - abdominal pain improved and decreased bloating. Has passing small amount of flatus. No BM  Objective: Vital signs in last 24 hours: Temp:  [97.5 F (36.4 C)-98.2 F (36.8 C)] 97.8 F (36.6 C) (02/11 0400) Pulse Rate:  [78-84] 79 (02/11 0400) Resp:  [15-20] 18 (02/11 0400) BP: (150-168)/(62-68) 150/62 (02/11 0400) SpO2:  [96 %-99 %] 98 % (02/11 0400) FiO2 (%):  [100 %] 100 % (02/10 0933) Weight:  [120 kg] 120 kg (02/11 0500) Last BM Date: 05/16/21  Intake/Output from previous day: 02/10 0701 - 02/11 0700 In: 1636.7 [I.V.:1206.7; NG/GT:130; IV Piggyback:300] Out: 3250 [Urine:450; Emesis/NG output:2800] Intake/Output this shift: No intake/output data recorded.  PE: Gen:  Alert, NAD, pleasant Card:  Regular rate and rhythm, pedal pulses 2+ BL Pulm:  Normal effort on room air Abd: Soft, moderate distention, NT, +bowel sounds; NGT with bilious output Skin: warm and dry, no rashes  Psych: A&Ox3   Lab Results:  Recent Labs    05/18/21 0641 05/19/21 0208  WBC 10.7* 8.7  HGB 12.7* 12.2*  HCT 40.5 38.5*  PLT 228 192    BMET Recent Labs    05/18/21 0641 05/19/21 0208  NA 140 140  K 4.3 4.2  CL 101 103  CO2 26 25  GLUCOSE 189* 153*  BUN 19 20  CREATININE 1.13 1.22  CALCIUM 9.0 8.6*    PT/INR No results for input(s): LABPROT, INR in the last 72 hours. CMP     Component Value Date/Time   NA 140 05/19/2021 0208   NA 141 06/03/2019 1543   K 4.2 05/19/2021 0208   CL 103 05/19/2021 0208   CO2 25 05/19/2021 0208   GLUCOSE 153 (H) 05/19/2021 0208   BUN 20 05/19/2021 0208   BUN 25 06/03/2019 1543   CREATININE 1.22 05/19/2021 0208   CALCIUM 8.6 (L) 05/19/2021 0208   PROT 7.6 05/16/2021 2159   ALBUMIN 4.5 05/16/2021 2159   AST 16 05/16/2021 2159   ALT 11 05/16/2021 2159   ALKPHOS 48 05/16/2021 2159   BILITOT 0.9 05/16/2021 2159   GFRNONAA 59 (L) 05/19/2021 0208    GFRAA >60 12/17/2019 0456   Lipase     Component Value Date/Time   LIPASE 21 05/16/2021 2159       Studies/Results: DG Abd Portable 1V  Result Date: 05/18/2021 CLINICAL DATA:  Abdominal pain since last Friday EXAM: PORTABLE ABDOMEN - 1 VIEW COMPARISON:  05/17/2021 FINDINGS: Persistent dilatation of small bowel loops consistent with small bowel obstruction. Some gas and stool within colon. Excreted contrast within urinary bladder. No definite bowel wall thickening. Osseous structures unremarkable. Surgical clips RIGHT upper quadrant consistent with history of cholecystectomy. IMPRESSION: Persistent small bowel obstruction. Electronically Signed   By: Lavonia Dana M.D.   On: 05/18/2021 08:55   DG Abd Portable 1V-Small Bowel Obstruction Protocol-initial, 8 hr delay  Result Date: 05/17/2021 CLINICAL DATA:  Small-bowel obstruction EXAM: PORTABLE ABDOMEN - 1 VIEW COMPARISON:  05/17/2021 FINDINGS: Nasogastric tube tip overlies the proximal body of the stomach. Multiple gas-filled dilated loops of small bowel are seen within the mid abdomen in keeping with a mid to distal small bowel obstruction. No free intraperitoneal gas. IMPRESSION: Nasogastric tube tip within the proximal body of the stomach. Mid to distal small-bowel obstruction. Electronically Signed   By: Fidela Salisbury M.D.   On: 05/17/2021 22:35   DG Abd Portable 1V  Result Date: 05/17/2021 CLINICAL  DATA:  SBO EXAM: PORTABLE ABDOMEN - 1 VIEW COMPARISON:  Radiograph dated December 16, 2019 FINDINGS: NG tube tip is in the proximal stomach with side port in the distal esophagus, recommend advancement. Dilated gas-filled loops of small bowel, compatible with history of small-bowel obstruction. IMPRESSION: NG tube tip is in the proximal stomach with side port in the distal esophagus, recommend advancement. Electronically Signed   By: Yetta Glassman M.D.   On: 05/17/2021 09:04    Anti-infectives: Anti-infectives (From admission, onward)     Start     Dose/Rate Route Frequency Ordered Stop   05/17/21 1400  cefTRIAXone (ROCEPHIN) 2 g in sodium chloride 0.9 % 100 mL IVPB        2 g 200 mL/hr over 30 Minutes Intravenous Every 24 hours 05/17/21 1314          Assessment/Plan  SBO, likely due to intra-abdominal adhesions in the setting of mult abdominal surgeries  - afebrile, vitals stable - CT abd pelvis 2/8 read as SBO with distal transition zone. There are no radiographic or clinical signs of a closed loop obstruction. - SBO protocol 2/9 >> KUB 2/10 with ongoing obstructive pattern, repeat this am pending - NGT with 2.8 L out over last 24H. - passing flatus but high NGT output. Keep to suction today.  - Ambulate - No emergent surgical needs today. Failure to improve with non-operative managemet may warrant laparotomy in the next few days.    CHF CAD s/p PCI 2002 A.fib on pradaxa - hold pradaxa  DM HTN Obesity   FEN: NPO NGT LIWS ID: ceftriaxone (UTI) VTE: heparin gtt (afib)     LOS: 2 days   I reviewed nursing notes, hospitalist notes, last 24 h vitals and pain scores, last 48 h intake and output, last 24 h labs and trends, and last 24 h imaging results.  This care required moderate level of medical decision making.   Winferd Humphrey, [facility] 05/19/2021, 7:54 AM Please see Amion for pager number during day hours 7:00am-4:30pm

## 2021-05-19 NOTE — Progress Notes (Signed)
Called and updated the patient's wife via phone.  Answered all questions to the best of my ability.

## 2021-05-19 NOTE — Progress Notes (Signed)
Headache  PROGRESS NOTE  Shawn Blanchard GGY:[id number] DOB: 1935-05-19 DOA: 05/16/2021 PCP: Haydee Salter, MD  HPI/Recap of past 24 hours: Shawn Blanchard is a 86 y.o. 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  Associated with nausea without vomiting.  Last bowel movement was 2 days prior to presentation and was watery.  His last obstruction occurred back in 12/2019, and was able to be managed medically.  Work-up revealed SBO seen on CT scan.  NG tube to suction was placed in the ED.  SBO protocol in place.  Ongoing serial abdominal x-ray.    Abdominal x-ray done 2/10 personally reviewed showed persistent dilatation of small bowel loops consistent with small bowel obstruction without oral contrast in the descending colon.  Repeat abdominal x-ray on 05/19/2021 pending.  05/19/21: Patient was seen and examined at his bedside.  He reports flatulence.  Denies any abdominal pain at the time of this exam.  No nausea.  States he feels like he will have a bowel movement.  Dulcolax suppository 10 mg x 1 added.  Patient encouraged to mobilize as tolerated.  Assessment/Plan: Principal Problem:   SBO (small bowel obstruction) (HCC) Active Problems:   Chronic atrial fibrillation (HCC)   Type 2 diabetes mellitus with neurologic complication, without long-term current use of insulin (HCC)   BMI 35.0-35.9,adult   Leukocytosis   Hypertensive urgency   UTI (urinary tract infection)   Normocytic anemia   History of lymphoma  SBO (small bowel obstruction) ([place])- (present on admission) Patient present with complaints of abdominal pain x1 day.   CT scan noted  dilated proximal small bowel with decompressed distal small bowel consistent with SBO.   Notes prior history of several occurrences of the same stemming from small bowel lymphoma back in 1990s requiring surgery and chemotherapy.   Last small bowel obstruction occurred back in 12/2019 and resolved with conservative management.   Continue for now NG tube to suction, abdomen is soft this morning. Continue gentle IV fluid hydration and closely monitor volume status with history of HFrEF 45 to 50%. Goal potassium greater than 4.0, goal magnesium greater than 2.0. Check electrolytes daily. Monitor volume status daily. Continue to Mobilize as tolerated. Pain management in place.  DC PCA morphine, added IV Dilaudid 1 mg every 4 hours as needed for severe pain.  Limit use of opiates. Appreciate general surgery's assistance. Positive flatulence on 05/19/2021, added Dulcolax suppository 10 mg x 1. Follow abdominal x-ray done on 05/19/2021.  Ruled out Presumptive UTI (urinary tract infection)- (present on admission) Patient reports that he has been having some mild burning with urination which is new.  Urinalysis was positive for small leukocytes, rare bacteria, and and 6-10 WBCs. Urine culture multiple species, suggest recollection. -Rocephin IV, since 05/17/2021, ended 05/19/21.   Improving Hypertensive urgency- (present on admission) Continue IV antihypertensive, IV Lopressor, with parameters while n.p.o. Prior to admission was on Coreg 3.125 mg twice daily, p.o. Lasix 20 mg twice daily. Continue to closely monitor vital signs.   Improving leukocytosis- (present on admission) Possibly reactive in the setting of small bowel obstruction versus presumptive UTI. On admission WBC elevated at 12.6.,  WBC down trending 8.7 from 10.7. Continue to  monitor WBC and fever curve.   Chronic atrial fibrillation ([place])- (present on admission) Patient appears to be relatively rate controlled.   Home medications  include Pradaxa and Coreg 3.125 mg twice daily, held due to n.p.o. On heparin drip for CVA prevention while oral anticoagulation is being held due to NPO. IV Lopressor as needed with parameters. Continue to monitor with remote telemetry.  Chronic systolic CHF Euvolemic on exam. Last 2D echo done on 12/16/2019 showed LVEF 45 to 50% Closely monitor volume status while on IV fluid hydration. Patient on p.o. Lasix 20 mg twice daily prior to admission, hold off diuretics in the setting of SBO. Oral cardiac medications on hold due to NPO. Continue strict I's and O's and daily weight Net I&O -1.9 L   BMI 35.0-35.9,adult BMI 35.73 kg/m Recommend weight loss outpatient with regular physical activity and healthy dieting.   Type 2 diabetes mellitus with neurologic complication, without long-term current use of insulin ([place])- (present on admission) Hemoglobin A1c 7.5 on 03/09/2021. Hold off home oral hypoglycemics. Continue insulin sliding scale every 6 hours while NPO. Avoid hypoglycemia.   Critical care time: 65 minutes.     Advance Care Planning:   Code Status: Full Code    Consults: General surgery   Family Communication: Updated his daughter at bedside.    Code Status: Full code.    Disposition Plan: Likely will discharge to home once general surgery signs of.   Consultants: General surgery.  Procedures: NG tube placement.  Antimicrobials: Rocephin started on 05/17/2021 for presumptive UTI.  DVT prophylaxis: Heparin drip  Status is: Inpatient  Patient requires at least 2 midnights for further evaluation and treatment of present condition.      Objective: Vitals:   05/18/21 2003 05/19/21 0400 05/19/21 0500 05/19/21 0803  BP: (!) 159/63 (!) 150/62  (!) 154/72  Pulse: 78 79  95  Resp: 18 18  18   Temp: (!) 97.5 F (36.4 C) 97.8 F (36.6 C)  97.6 F (36.4 C)  TempSrc: Oral Oral  Oral  SpO2: 96% 98%  98%  Weight:   120 kg   Height:        Intake/Output  Summary (Last 24 hours) at 05/19/2021 1335 Last data filed at 05/19/2021 0500 Gross per 24 hour  Intake 1217.47 ml  Output 1800 ml  Net -582.53 ml   Filed Weights   05/17/21 0304 05/18/21 0407 05/19/21 0500  Weight: 116.2 kg 119.2 kg 120 kg    Exam:  General: 86 y.o. year-old male well-developed well-nourished no acute distress.  He is alert and interactive.   Cardiovascular: Irregular rate and rhythm no rubs or gallops. Respiratory: Clear to auscultation no wheezes or rales.   Abdomen: Soft obese nontender to palpation.  Bowel sounds present.  Musculoskeletal: Trace lower extremity edema bilaterally. Skin: Mild hyperpigmentation involving lower extremities bilaterally. Psychiatry: Mood is appropriate for condition and setting. Neuro: Moves all 4 extremities.  Nonfocal exam.   Data Reviewed: CBC: Recent Labs  Lab 05/16/21 2159 05/18/21 0641 05/19/21 0208  WBC 12.6* 10.7* 8.7  HGB 12.7* 12.7* 12.2*  HCT 40.3 40.5 38.5*  MCV 83.6 84.9 85.6  PLT 232 228 546   Basic Metabolic Panel: Recent Labs  Lab 05/16/21 2159 05/18/21 0641 05/19/21 0208  NA 138 140 140  K 4.5 4.3 4.2  CL 102 101 103  CO2 24 26 25   GLUCOSE 170* 189* 153*  BUN 20 19 20   CREATININE 1.12 1.13 1.22  CALCIUM 9.9 9.0 8.6*  MG  --  2.2  --   PHOS  --  3.6  --    GFR: Estimated Creatinine Clearance: 60.5 mL/min (by C-G formula based on SCr of 1.22 mg/dL). Liver Function Tests: Recent Labs  Lab 05/16/21 2159  AST 16  ALT 11  ALKPHOS 48  BILITOT 0.9  PROT 7.6  ALBUMIN 4.5   Recent Labs  Lab 05/16/21 2159  LIPASE 21   No results for input(s): AMMONIA in the last 168 hours. Coagulation Profile: No results for input(s): INR, PROTIME in the last 168 hours. Cardiac Enzymes: No results for input(s): CKTOTAL, CKMB, CKMBINDEX, TROPONINI in the last 168 hours. BNP (last 3 results) No results for input(s): PROBNP in the last 8760 hours. HbA1C: No results for input(s): HGBA1C in the last 72  hours. CBG: Recent Labs  Lab 05/18/21 2013 05/19/21 0220 05/19/21 0654 05/19/21 0801 05/19/21 1327  GLUCAP 155* 162* 171* 134* 148*   Lipid Profile: No results for input(s): CHOL, HDL, LDLCALC, TRIG, CHOLHDL, LDLDIRECT in the last 72 hours. Thyroid Function Tests: No results for input(s): TSH, T4TOTAL, FREET4, T3FREE, THYROIDAB in the last 72 hours. Anemia Panel: No results for input(s): VITAMINB12, FOLATE, FERRITIN, TIBC, IRON, RETICCTPCT in the last 72 hours. Urine analysis:    Component Value Date/Time   COLORURINE YELLOW 05/16/2021 2159   APPEARANCEUR CLEAR 05/16/2021 2159   LABSPEC 1.021 05/16/2021 2159   PHURINE 5.0 05/16/2021 2159   GLUCOSEU NEGATIVE 05/16/2021 2159   GLUCOSEU >=1000 (A) 11/09/2020 0757   HGBUR NEGATIVE 05/16/2021 2159   [place] NEGATIVE 05/16/2021 2159   [place] NEGATIVE 05/16/2021 2159   PROTEINUR 30 (A) 05/16/2021 2159   UROBILINOGEN 0.2 11/09/2020 0757   NITRITE NEGATIVE 05/16/2021 2159   LEUKOCYTESUR SMALL (A) 05/16/2021 2159   Sepsis Labs: @LABRCNTIP (procalcitonin:4,lacticidven:4)  ) Recent Results (from the past 240 hour(s))  Urine Culture     Status: Abnormal   Collection Time: 05/17/21  1:46 PM   Specimen: Urine, Clean Catch  Result Value Ref Range Status   Specimen Description URINE, CLEAN CATCH  Final   Special Requests   Final    NONE Performed at [facility], [place] [address] [place], [place] [address]    Culture MULTIPLE SPECIES PRESENT, SUGGEST RECOLLECTION (A)  Final   Report Status 05/18/2021 FINAL  Final      Studies: No results found.  Scheduled Meds:  insulin aspart  0-6 Units Subcutaneous Q6H   mouth rinse  15 mL Mouth Rinse BID   pantoprazole (PROTONIX) IV  40 mg Intravenous Q12H   sodium chloride flush  3 mL Intravenous Q12H    Continuous Infusions:  sodium chloride 75 mL/hr at 05/18/21 1852   cefTRIAXone (ROCEPHIN)  IV 2 g (05/19/21 1331)   heparin 1,900 Units/hr (05/19/21 0759)     LOS: 2  days     Kayleen Memos, MD [facility] Pager [phone]  If 7PM-7AM, please contact night-coverage [email or web address] Password [facility] 05/19/2021, 1:35 PM

## 2021-05-19 NOTE — Progress Notes (Signed)
Pt ambulated half a lap a unit this evening. Flushed NGT and blue port as ordered with minimal output from tube

## 2021-05-19 NOTE — Evaluation (Signed)
Physical Therapy Evaluation Patient Details Name: Shawn Blanchard MRN: [id number] DOB: 1935/10/24 Today's Date: 05/19/2021  History of Present Illness  Pt. is 86 yr old M admitted on 05/16/21 with c/o abdominal pain.  Imaging (+) for SBO. PMH: CHF, small bowel CA with hx of multiple SBO, arthritis, CTS, A-fib, DM, neuropathy, gout, MI, OSA.  Clinical Impression  Pt was previously modified independent with functional mobility and ADLs.  Pt currently has SBO and is requiring min A for transfers and bed mobility with use of RW.  Pt demos poor safety awareness, dec activity tolerance, dec LE strength, transfer difficulty and would benefit from skilled PT to address deficits indicated in initial eval.  Pt preference is to return home with OP therapy and pt should be safe to do so with assist from spouse, however, eval limited to transfers only today per NSG and further assessment of gait would be beneficial in determining safety with return home.       Recommendations for follow up therapy are one component of a multi-disciplinary discharge planning process, led by the attending physician.  Recommendations may be updated based on patient status, additional functional criteria and insurance authorization.  Follow Up Recommendations Outpatient PT (Pt's preference is to return to OP center when he recently had PT, declines HH therapy.)    Assistance Recommended at Discharge Intermittent Supervision/Assistance  Patient can return home with the following  A little help with walking and/or transfers;A little help with bathing/dressing/bathroom;Assistance with cooking/housework;Assist for transportation    Equipment Recommendations  (Pt has RW at home)  Recommendations for Other Services       Functional Status Assessment Patient has had a recent decline in their functional status and demonstrates the ability to make significant improvements in function in a reasonable and predictable amount of time.      Precautions / Restrictions Precautions Precautions: None      Mobility  Bed Mobility Overal bed mobility: Needs Assistance Bed Mobility: Supine to Sit     Supine to sit: Min assist     General bed mobility comments: Requires min A for HH support to sit upright in bed.  Able to scoot to EOB with independence. Patient Response: Cooperative  Transfers Overall transfer level: Needs assistance Equipment used: Rolling walker (2 wheels) Transfers: Sit to/from Stand, Bed to chair/wheelchair/BSC Sit to Stand: Min assist   Step pivot transfers: Min guard       General transfer comment: Pt performs sit > stand with min A.  Takes 2 attempts to complete with 1 hand pushing from bed, 1 hand on RW.  Able to take small steps over to chair with PT managing lines.  Pushes RW aside before transfer and with pt standing on R side of RW holds R handle with his left hand and reaches for chair handle with R hand during transfer.  Needs CGA for safety.  PT educates pt to use RW properly for improved safety.    Ambulation/Gait                  Stairs            Wheelchair Mobility    Modified Rankin (Stroke Patients Only)       Balance Overall balance assessment: Mild deficits observed, not formally tested  Pertinent Vitals/Pain Pain Assessment Pain Assessment: 0-10 Pain Score: 2  Pain Location: Abdomen Pain Descriptors / Indicators: Aching, Discomfort Pain Intervention(s): Limited activity within patient's tolerance, Repositioned    Home Living Family/patient expects to be discharged to:: Private residence Living Arrangements: Spouse/significant other Available Help at Discharge: Family;Available 24 hours/day Type of Home: House Home Access: Level entry     Alternate Level Stairs-Number of Steps: Does not go to alternate level Home Layout: Two level Home Equipment: Rollator (4 wheels);Cane - single  point (Uses equipment only as needed)      Prior Function Prior Level of Function : Independent/Modified Independent             Mobility Comments: States he just graduated from OP therapy last week.       Hand Dominance        Extremity/Trunk Assessment   Upper Extremity Assessment Upper Extremity Assessment: Defer to OT evaluation    Lower Extremity Assessment Lower Extremity Assessment: Overall WFL for tasks assessed       Communication   Communication: No difficulties  Cognition Arousal/Alertness: Awake/alert Behavior During Therapy: WFL for tasks assessed/performed Overall Cognitive Status: Within Functional Limits for tasks assessed                                 General Comments: Pt. is supine in bed when PT arrives.  Agreeable to work with therapy.  NSG requests session is limited only to chair transfer at this time as pt is on continuous suction and had recent run of vtach.  Per pt, he was able to amb to bathroom earlier with use of RW.        General Comments      Exercises     Assessment/Plan    PT Assessment Patient needs continued PT services  PT Problem List Decreased strength;Decreased mobility;Decreased safety awareness;Decreased activity tolerance;Decreased knowledge of use of DME;Decreased balance;Pain       PT Treatment Interventions DME instruction;Therapeutic exercise;Gait training;Balance training;Functional mobility training;Therapeutic activities;Patient/family education    PT Goals (Current goals can be found in the Care Plan section)  Acute Rehab PT Goals Patient Stated Goal: Pt's goal is to return home. PT Goal Formulation: With patient Time For Goal Achievement: 06/02/21 Potential to Achieve Goals: Good    Frequency Min 3X/week     Co-evaluation               AM-PAC PT "6 Clicks" Mobility  Outcome Measure Help needed turning from your back to your side while in a flat bed without using bedrails?:  None Help needed moving from lying on your back to sitting on the side of a flat bed without using bedrails?: A Little Help needed moving to and from a bed to a chair (including a wheelchair)?: A Little Help needed standing up from a chair using your arms (e.g., wheelchair or bedside chair)?: A Little Help needed to walk in hospital room?: A Little Help needed climbing 3-5 steps with a railing? : A Little 6 Click Score: 19    End of Session Equipment Utilized During Treatment: Gait belt Activity Tolerance: Patient tolerated treatment well Patient left: in chair;with call bell/phone within reach;with chair alarm set   PT Visit Diagnosis: Other abnormalities of gait and mobility (R26.89)    Time: 7035-0093 PT Time Calculation (min) (ACUTE ONLY): 16 min   Charges:   PT Evaluation $PT Eval Low Complexity: 1 Low  Nihaal Friesen A. Traci Plemons, PT, DPT Acute Rehabilitation Services Office: [place] 05/19/2021, 10:40 AM

## 2021-05-20 ENCOUNTER — Inpatient Hospital Stay (HOSPITAL_COMMUNITY): Payer: Medicare PPO

## 2021-05-20 LAB — MAGNESIUM: Magnesium: 2 mg/dL (ref 1.7–2.4)

## 2021-05-20 LAB — GLUCOSE, CAPILLARY
Glucose-Capillary: 136 mg/dL — ABNORMAL HIGH (ref 70–99)
Glucose-Capillary: 137 mg/dL — ABNORMAL HIGH (ref 70–99)
Glucose-Capillary: 194 mg/dL — ABNORMAL HIGH (ref 70–99)

## 2021-05-20 LAB — BASIC METABOLIC PANEL
Anion gap: 12 (ref 5–15)
BUN: 21 mg/dL (ref 8–23)
CO2: 24 mmol/L (ref 22–32)
Calcium: 8.3 mg/dL — ABNORMAL LOW (ref 8.9–10.3)
Chloride: 104 mmol/L (ref 98–111)
Creatinine, Ser: 1.2 mg/dL (ref 0.61–1.24)
GFR, Estimated: >60 mL/min (ref >60)
Glucose, Bld: 137 mg/dL — ABNORMAL HIGH (ref 70–99)
Potassium: 3.5 mmol/L (ref 3.5–5.1)
Sodium: 140 mmol/L (ref 135–145)

## 2021-05-20 LAB — CBC
HCT: 37.7 % — ABNORMAL LOW (ref 39.0–52.0)
Hemoglobin: 11.5 g/dL — ABNORMAL LOW (ref 13.0–17.0)
MCH: 26.6 pg (ref 26.0–34.0)
MCHC: 30.5 g/dL (ref 30.0–36.0)
MCV: 87.3 fL (ref 80.0–100.0)
Platelets: 179 10*3/uL (ref 150–400)
RBC: 4.32 MIL/uL (ref 4.22–5.81)
RDW: 15.5 % (ref 11.5–15.5)
WBC: 7.1 10*3/uL (ref 4.0–10.5)
nRBC: 0 % (ref 0.0–0.2)

## 2021-05-20 LAB — HEPARIN LEVEL (UNFRACTIONATED): Heparin Unfractionated: 0.7 IU/mL (ref 0.30–0.70)

## 2021-05-20 MED ORDER — POTASSIUM CHLORIDE 10 MEQ/100ML IV SOLN
10.0000 meq | INTRAVENOUS | Status: AC
  Administered 2021-05-20 (×2): 10 meq via INTRAVENOUS
  Filled 2021-05-20 (×2): qty 100

## 2021-05-20 MED ORDER — CARVEDILOL 3.125 MG PO TABS
3.1250 mg | ORAL_TABLET | Freq: Two times a day (BID) | ORAL | Status: DC
  Administered 2021-05-20 – 2021-05-22 (×4): 3.125 mg via ORAL
  Filled 2021-05-20 (×4): qty 1

## 2021-05-20 MED ORDER — ACETAMINOPHEN 500 MG PO TABS
1000.0000 mg | ORAL_TABLET | Freq: Four times a day (QID) | ORAL | Status: DC
  Administered 2021-05-20 – 2021-05-22 (×8): 1000 mg via ORAL
  Filled 2021-05-20 (×10): qty 2

## 2021-05-20 MED ORDER — DOCUSATE SODIUM 100 MG PO CAPS
100.0000 mg | ORAL_CAPSULE | Freq: Two times a day (BID) | ORAL | Status: DC
  Administered 2021-05-20 – 2021-05-22 (×4): 100 mg via ORAL
  Filled 2021-05-20 (×5): qty 1

## 2021-05-20 MED ORDER — ACETAMINOPHEN 10 MG/ML IV SOLN
1000.0000 mg | Freq: Once | INTRAVENOUS | Status: DC
Start: 2021-05-20 — End: 2021-05-21

## 2021-05-20 MED ORDER — BISACODYL 10 MG RE SUPP
10.0000 mg | Freq: Once | RECTAL | Status: AC
  Administered 2021-05-20: 10 mg via RECTAL
  Filled 2021-05-20: qty 1

## 2021-05-20 MED ORDER — TRAMADOL HCL 50 MG PO TABS: 50.0000 mg | ORAL_TABLET | Freq: Four times a day (QID) | ORAL | Status: DC | PRN

## 2021-05-20 MED ORDER — CARVEDILOL 3.125 MG PO TABS
3.1250 mg | ORAL_TABLET | ORAL | Status: AC
Start: 2021-05-20 — End: 2021-05-20
  Administered 2021-05-20: 3.125 mg via ORAL
  Filled 2021-05-20: qty 1

## 2021-05-20 NOTE — Progress Notes (Signed)
Mobility Specialist: Progress Note   05/20/21 1711  Mobility  Activity Refused mobility   Pt refused mobility stating he walked not too long ago and wishes to rest at this time. Will f/u tomorrow.   [facility] Kingjames Coury Mobility Specialist Mobility Specialist 5 North: [phone] Mobility Specialist 6 North: [phone]

## 2021-05-20 NOTE — Progress Notes (Signed)
ANTICOAGULATION CONSULT NOTE  Pharmacy Consult: IV Heparin Indication: atrial fibrillation  Allergies  Allergen Reactions   Atorvastatin Other (See Comments)    Dizziness.   Jardiance [Empagliflozin] Other (See Comments)    Orthostasis   Sulfa Antibiotics    Ace Inhibitors Rash   Amoxicillin Rash   Meperidine Rash   Meperidine Hcl Rash   Naproxen Sodium Rash   Sulfamethoxazole Rash   Tetracycline Rash    Patient Measurements: Height: 5\' 11"  (180.3 cm) Weight: 120 kg (264 lb 8.8 oz) IBW/kg (Calculated) : 75.3 Heparin Dosing Weight: 100 kg  Vital Signs: Temp: 98.1 F (36.7 C) (02/12 0410) Temp Source: Oral (02/12 0410) BP: 139/80 (02/12 0410) Pulse Rate: 54 (02/12 0410)  Labs: Recent Labs    05/18/21 0641 05/18/21 1557 05/19/21 0208 05/20/21 0057  HGB 12.7*  --  12.2* 11.5*  HCT 40.5  --  38.5* 37.7*  PLT 228  --  192 179  HEPARINUNFRC 0.29* 0.57 0.63 0.70  CREATININE 1.13  --  1.22 1.20    Estimated Creatinine Clearance: 61.5 mL/min (by C-G formula based on SCr of 1.2 mg/dL).  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   Arthritis 03/12/2016   Bilateral hand pain 03/25/2019   Blepharitis of both upper and lower eyelid 10/02/2018   Carpal tunnel syndrome of right wrist 03/25/2019   Chronic anticoagulation 10/11/2016   Chronic atrial fibrillation ([place]) 06/12/2015   Overview:  Managed CARDS   Chronic diastolic heart failure ([place]) 09/01/2015   Coronary artery disease involving native coronary artery of native heart with angina pectoris ([place]) 02/15/2015   Overview:  Hx of MI with stent to LAD 2002 MPS 2016 with EF 47% and no ischemia   Dermatochalasis of both upper eyelids 11/21/2016   Dermatochalasis of right upper eyelid 10/02/2018   Diabetic polyneuropathy associated with type 2 diabetes mellitus ([place]) 12/16/2018   2020   Dyslipidemia  11/07/2017   Essential hypertension 11/07/2017   Ganglion cyst 11/27/2015   Overview:  2017: dorsum right hand   Gastroesophageal reflux disease without esophagitis 06/12/2015   Hyperlipidemia 04/11/2016   Hypertensive heart disease with heart failure ([place]) 02/15/2015   Idiopathic chronic gout 05/17/2015   Insomnia 11/27/2015   Leukocytosis 04/11/2016   Lightheadedness 10/11/2016   Long term current use of oral hypoglycemic drug 10/02/2018   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[place]) 08/30/2015   Male erectile disorder 06/17/2016   Meibomian gland dysfunction (MGD) of both eyes 10/02/2018   MI (myocardial infarction) ([place]) 02/15/2015   Overview:  2002   Obesity 04/11/2016   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 exacerbation ([place]) 06/12/2015   Obstructive sleep apnea 08/30/2015   Overview:  CPAP   Old MI (myocardial infarction)    Orthostatic dizziness 09/15/2017   2018: med related   Permanent atrial fibrillation ([place]) 06/12/2015   Overview:  Managed CARDS   Pseudophakia of both eyes 03/12/2016   Pulmonary hypertension ([place]) 08/30/2015   Overview:  Managed CARDS   Purulent inflammation of skin 12/29/2018   Restless leg syndrome 08/30/2015   Restrictive lung disease 12/26/2016   Overview:  2018: mod on spirometry   SBO (small bowel obstruction) ([place]) 12/14/2019   Senile nuclear sclerosis 05/02/2016   Small bowel obstruction ([place]) 04/11/2016   SOB (shortness of breath) 08/14/2015   Type 2 diabetes mellitus with neurologic complication, without long-term current use of insulin ([place]) 12/02/2014  Unilateral inguinal hernia without obstruction or gangrene 06/12/2015    Assessment: 86 yr old man presented with abdominal pain and was found to have SBO, likely due to intra-abdominal adhesions in setting of multiple abdominal surgeries.  Pharmacy was consulted to transition patient from Pradaxa (last dose 2/8 AM) to IV heparin for history of Afib.  Baseline labs reviewed.  Heparin level therapeutic at 0.70, on 1900 units/hr. CBC  stable. No s/sx of bleeding or infusion issues per nursing.   Goal of Therapy:  Heparin level 0.3-0.7 units/ml Monitor platelets by anticoagulation protocol: Yes   Plan:  Continue heparin infusion at 1900 units/hr  Monitor daily heparin level and CBC, s/sx bleeding  Lestine Box, PharmD PGY2 Infectious Diseases Pharmacy Resident   Please check [email or web address] for unit-specific pharmacy phone numbers

## 2021-05-20 NOTE — Progress Notes (Signed)
Headache  PROGRESS NOTE  ORLEY LAWRY YHC:[id number] DOB: 1936/04/05 DOA: 05/16/2021 PCP: Haydee Salter, MD  HPI/Recap of past 24 hours: Shawn Blanchard is a 86 y.o. 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  Associated with nausea without vomiting.  Last bowel movement was 2 days prior to presentation and was watery.  His last obstruction occurred back in 12/2019, and was able to be managed medically.  Work-up revealed SBO seen on CT scan.  NG tube to suction was placed in the ED.  SBO protocol in place.  Ongoing serial abdominal x-ray.    Abdominal x-ray done 2/10 personally reviewed showed persistent dilatation of small bowel loops consistent with small bowel obstruction without oral contrast in the descending colon.  Repeat abdominal x-ray on 05/19/2021 showed improvement of SBO.  05/20/21: Patient was seen at his bedside.  Had a bowel movement last night.  Positive flatus this morning.  No nausea.  Started on clear liquid diet.  Return to his room this afternoon tolerated breakfast and lunch on a clear liquid diet.  Dulcolax suppository x1 ordered.  Possible removal of the NG tube per surgery if he continues to tolerate a diet.  Assessment/Plan: Principal Problem:   SBO (small bowel obstruction) (HCC) Active Problems:   Chronic atrial fibrillation (HCC)   Type 2 diabetes mellitus with neurologic complication, without long-term current use of insulin (HCC)   BMI 35.0-35.9,adult   Leukocytosis   Hypertensive urgency   UTI (urinary tract infection)   Normocytic anemia   History of lymphoma  Improving SBO (small bowel obstruction)  ([place])- (present on admission) Patient present with complaints of abdominal pain x1 day.   CT scan noted dilated proximal small bowel with decompressed distal small bowel consistent with SBO.   Notes prior history of several occurrences of the same stemming from small bowel lymphoma back in 1990s requiring surgery and chemotherapy.   Last small bowel obstruction occurred back in 12/2019 and resolved with conservative management.   SBO is improving, now partial small bowel obstruction. Started on clear liquid diet 05/20/2021 and tolerated well. Possible removal of NG tube today.  Ruled out Presumptive UTI (urinary tract infection)- (present on admission) Patient reports that he has been having some mild burning with urination which is new.  Urinalysis was positive for small leukocytes, rare bacteria, and and 6-10 WBCs. Urine culture multiple species, suggest recollection. -Rocephin IV, since 05/17/2021, ended 05/19/21.   Resolved hypertensive urgency- (present on admission) Resume home oral antihypertensives once can tolerate a diet. Restart home Coreg. Hold off p.o. Lasix for now to avoid dehydration. Continue to closely monitor vital signs.   Resolved leukocytosis- (present on admission) Possibly reactive in the setting of small bowel obstruction versus presumptive UTI. On admission WBC elevated at 12.6.,  WBC down trending 8.7 from 10.7. Continue to monitor WBC and fever curve.   Chronic atrial fibrillation ([place])- (present on admission) Patient appears to be relatively rate controlled.   Home medications include Pradaxa and Coreg 3.125 mg twice daily Restarted Coreg on 05/20/2021 as he is tolerating a clear liquid diet.   Pradaxa on hold until no surgical procedure planned.   Continue heparin drip for now.  Chronic systolic CHF Euvolemic on exam. Last 2D echo done on 12/16/2019 showed LVEF 45 to 50% Closely monitor volume status while on IV fluid hydration. Hold off home oral Lasix for  now to avoid dehydration. We will start oral Lasix tomorrow 05/21/2021 as he continues to tolerate a diet.   BMI 35.0-35.9,adult BMI 35.73 kg/m Recommend weight loss outpatient with regular physical activity and healthy dieting.   Type 2 diabetes mellitus with neurologic complication, without long-term current use of insulin ([place])- (present on admission) Hemoglobin A1c 7.5 on 03/09/2021. Continue to hold off home oral hypoglycemics. Continue insulin sliding scale every 6 hours while NPO. Continue to avoid hypoglycemia.  Critical care time: 65 minutes.     Advance Care Planning:   Code Status: Full Code    Consults: General surgery   Family Communication: Updated his 2 daughters, individually, at bedside on 05/20/2021.    Code Status: Full code.    Disposition Plan: Likely will discharge to home once general surgery signs off.   Consultants: General surgery.  Procedures: NG tube placement.  Antimicrobials: Rocephin started on 05/17/2021 for presumptive UTI.  DVT prophylaxis: Heparin drip  Status is: Inpatient  Patient requires at least 2 midnights for further evaluation and treatment of present condition.      Objective: Vitals:   05/19/21 1600 05/19/21 1933 05/20/21 0410 05/20/21 0752  BP: (!) 147/76 123/86 139/80 (!) 160/71  Pulse: 88 85 (!) 54 86  Resp: 19 18 19 18   Temp: 98.2 F (36.8 C) 98.2 F (36.8 C) 98.1 F (36.7 C) (!) 97.4 F (36.3 C)  TempSrc:   Oral Oral  SpO2: 97% 95% 94% 95%  Weight:      Height:        Intake/Output Summary (Last 24 hours) at 05/20/2021 1357 Last data filed at 05/20/2021 7628 Gross per 24 hour  Intake 3041.59 ml  Output 850 ml  Net 2191.59 ml   Filed Weights   05/17/21 0304 05/18/21 0407 05/19/21 0500  Weight: 116.2 kg 119.2 kg 120 kg    Exam:  General: 86 y.o. year-old male obese in no acute distress.  He is alert and oriented x3.   Cardiovascular: Irregular rate and rhythm no rubs gallops.   Respiratory:  Clear to auscultation no wheezes or rales. Abdomen: Obese bowel sounds present. Musculoskeletal: Trace lower extremity edema bilaterally.   Skin: Mild hyper pigmentation in lower extremities bilaterally. Psychiatry: Mood is appropriate for condition and setting.. Neuro: Moves all 4 extremities.  Nonfocal exam.   Data Reviewed: CBC: Recent Labs  Lab 05/16/21 2159 05/18/21 0641 05/19/21 0208 05/20/21 0057  WBC 12.6* 10.7* 8.7 7.1  HGB 12.7* 12.7* 12.2* 11.5*  HCT 40.3 40.5 38.5* 37.7*  MCV 83.6 84.9 85.6 87.3  PLT 232 228 192 315   Basic Metabolic Panel: Recent Labs  Lab 05/16/21 2159 05/18/21 0641 05/19/21 0208 05/20/21 0057  NA 138 140 140 140  K 4.5 4.3 4.2 3.5  CL 102 101 103 104  CO2 24 26 25 24   GLUCOSE 170* 189* 153* 137*  BUN 20 19 20 21   CREATININE 1.12 1.13 1.22 1.20  CALCIUM 9.9 9.0 8.6* 8.3*  MG  --  2.2  --  2.0  PHOS  --  3.6  --   --    GFR: Estimated Creatinine Clearance: 61.5 mL/min (by C-G formula based on SCr of 1.2 mg/dL). Liver Function Tests: Recent Labs  Lab 05/16/21 2159  AST 16  ALT 11  ALKPHOS 48  BILITOT 0.9  PROT 7.6  ALBUMIN 4.5   Recent Labs  Lab 05/16/21 2159  LIPASE 21   No results for input(s): AMMONIA in the last 168 hours. Coagulation Profile: No results for input(s): INR, PROTIME in the last 168 hours. Cardiac Enzymes: No results for input(s): CKTOTAL, CKMB, CKMBINDEX, TROPONINI in the last 168 hours. BNP (last 3 results) No results for input(s): PROBNP in the last 8760 hours. HbA1C: No results for input(s): HGBA1C in the last 72 hours. CBG: Recent Labs  Lab 05/19/21 1327 05/19/21 1711 05/19/21 2355 05/20/21 0629 05/20/21 1131  GLUCAP 148* 173* 136* 136* 137*   Lipid Profile: No results for input(s): CHOL, HDL, LDLCALC, TRIG, CHOLHDL, LDLDIRECT in the last 72 hours. Thyroid Function Tests: No results for input(s): TSH, T4TOTAL, FREET4, T3FREE, THYROIDAB in the last 72 hours. Anemia Panel: No results  for input(s): VITAMINB12, FOLATE, FERRITIN, TIBC, IRON, RETICCTPCT in the last 72 hours. Urine analysis:    Component Value Date/Time   COLORURINE YELLOW 05/16/2021 2159   APPEARANCEUR CLEAR 05/16/2021 2159   LABSPEC 1.021 05/16/2021 2159   PHURINE 5.0 05/16/2021 2159   GLUCOSEU NEGATIVE 05/16/2021 2159   GLUCOSEU >=1000 (A) 11/09/2020 0757   HGBUR NEGATIVE 05/16/2021 2159   [place] NEGATIVE 05/16/2021 2159   [place] NEGATIVE 05/16/2021 2159   PROTEINUR 30 (A) 05/16/2021 2159   UROBILINOGEN 0.2 11/09/2020 0757   NITRITE NEGATIVE 05/16/2021 2159   LEUKOCYTESUR SMALL (A) 05/16/2021 2159   Sepsis Labs: @LABRCNTIP (procalcitonin:4,lacticidven:4)  ) Recent Results (from the past 240 hour(s))  Urine Culture     Status: Abnormal   Collection Time: 05/17/21  1:46 PM   Specimen: Urine, Clean Catch  Result Value Ref Range Status   Specimen Description URINE, CLEAN CATCH  Final   Special Requests   Final    NONE Performed at [facility], [place] [address], [place], [place] [address]    Culture MULTIPLE SPECIES PRESENT, SUGGEST RECOLLECTION (A)  Final   Report Status 05/18/2021 FINAL  Final      Studies: DG Abd Portable 1V  Result Date: 05/20/2021 CLINICAL DATA:  86 year old male with history of abdominal distension. EXAM: PORTABLE ABDOMEN - 1 VIEW COMPARISON:  Abdominal radiograph 05/19/2021. FINDINGS: Some gas and stool is noted throughout the colon and rectum. Several dilated loops of small bowel are noted in the abdomen, measuring up to 4.7 cm in the right-side of the abdomen. No pneumoperitoneum noted on these supine images. Nasogastric tube is noted, with tip in the distal third of the esophagus. IMPRESSION: 1. The appearance of the abdomen again suggests partial small bowel obstruction. 2. Nasogastric tube is in a high position in the distal esophagus and should be advanced approximately 20 cm for more optimal placement. Electronically Signed   By: Vinnie Langton M.D.    On: 05/20/2021 09:04    Scheduled Meds:  acetaminophen  1,000 mg Oral Q6H   docusate sodium  100 mg Oral BID   insulin aspart  0-6 Units Subcutaneous Q6H   mouth rinse  15 mL Mouth Rinse BID   pantoprazole (PROTONIX) IV  40 mg Intravenous Q12H   sodium chloride flush  3 mL Intravenous Q12H    Continuous Infusions:  sodium chloride 75 mL/hr at 05/20/21 6948   acetaminophen     heparin 1,900 Units/hr (05/19/21 2005)     LOS: 3 days     Kayleen Memos, MD [facility] Pager [phone]  If 7PM-7AM, please contact night-coverage [email or web address] Password [facility] 05/20/2021, 1:57  7:24 PM

## 2021-05-20 NOTE — Progress Notes (Signed)
[facility] Progress Note     Subjective: BM last night after suppository. Passing flatus. No nausea or emesis. Still feeling bloated - stable from yesterday. Has been OOB  Objective: Vital signs in last 24 hours: Temp:  [97.6 F (36.4 C)-98.2 F (36.8 C)] 98.1 F (36.7 C) (02/12 0410) Pulse Rate:  [54-95] 54 (02/12 0410) Resp:  [18-19] 19 (02/12 0410) BP: (123-154)/(72-86) 139/80 (02/12 0410) SpO2:  [94 %-98 %] 94 % (02/12 0410) Last BM Date: 05/16/21  Intake/Output from previous day: 02/11 0701 - 02/12 0700 In: 3041.6 [P.O.:240; I.V.:2670.8; NG/GT:130; IV Piggyback:0.8] Out: 850 [Urine:150; Emesis/NG output:700] Intake/Output this shift: No intake/output data recorded.  PE: Gen:  Alert, NAD, pleasant Card:  Regular rate and rhythm, pedal pulses 2+ BL Pulm:  Normal effort on room air Abd: Soft, moderate distention, NT, +bowel sounds; NGT with thin bilious output Skin: warm and dry, no rashes  Psych: A&Ox3   Lab Results:  Recent Labs    05/19/21 0208 05/20/21 0057  WBC 8.7 7.1  HGB 12.2* 11.5*  HCT 38.5* 37.7*  PLT 192 179    BMET Recent Labs    05/19/21 0208 05/20/21 0057  NA 140 140  K 4.2 3.5  CL 103 104  CO2 25 24  GLUCOSE 153* 137*  BUN 20 21  CREATININE 1.22 1.20  CALCIUM 8.6* 8.3*    PT/INR No results for input(s): LABPROT, INR in the last 72 hours. CMP     Component Value Date/Time   NA 140 05/20/2021 0057   NA 141 06/03/2019 1543   K 3.5 05/20/2021 0057   CL 104 05/20/2021 0057   CO2 24 05/20/2021 0057   GLUCOSE 137 (H) 05/20/2021 0057   BUN 21 05/20/2021 0057   BUN 25 06/03/2019 1543   CREATININE 1.20 05/20/2021 0057   CALCIUM 8.3 (L) 05/20/2021 0057   PROT 7.6 05/16/2021 2159   ALBUMIN 4.5 05/16/2021 2159   AST 16 05/16/2021 2159   ALT 11 05/16/2021 2159   ALKPHOS 48 05/16/2021 2159   BILITOT 0.9 05/16/2021 2159   GFRNONAA >60 05/20/2021 0057   GFRAA >60 12/17/2019 0456   Lipase     Component Value Date/Time    LIPASE 21 05/16/2021 2159       Studies/Results: DG CHEST PORT 1 VIEW  Result Date: 05/19/2021 CLINICAL DATA:  Abdominal pain.  Small-bowel obstruction. EXAM: PORTABLE CHEST 1 VIEW COMPARISON:  05/17/2021 FINDINGS: Orogastric or nasogastric tube enters the abdomen. Cardiomegaly as seen previously. The lungs are clear. No infiltrate, collapse or effusion. IMPRESSION: No active disease. Electronically Signed   By: Nelson Chimes M.D.   On: 05/19/2021 19:23   DG Abd Portable 1V  Result Date: 05/19/2021 CLINICAL DATA:  Small-bowel obstruction.  Abdominal pain. EXAM: PORTABLE ABDOMEN - 1 VIEW COMPARISON:  05/18/2021 FINDINGS: Orogastric or nasogastric tube has its tip in the fundus of the stomach. Few mildly prominent loops of small intestine, improved since yesterday's imaging. Gas present within the colon. IMPRESSION: Partial small bowel obstruction with improved radiographic appearance. Orogastric or nasogastric tube tip in the stomach. Electronically Signed   By: Nelson Chimes M.D.   On: 05/19/2021 19:24   DG Abd Portable 1V  Result Date: 05/18/2021 CLINICAL DATA:  Abdominal pain since last Friday EXAM: PORTABLE ABDOMEN - 1 VIEW COMPARISON:  05/17/2021 FINDINGS: Persistent dilatation of small bowel loops consistent with small bowel obstruction. Some gas and stool within colon. Excreted contrast within urinary bladder. No definite bowel wall thickening. Osseous structures  unremarkable. Surgical clips RIGHT upper quadrant consistent with history of cholecystectomy. IMPRESSION: Persistent small bowel obstruction. Electronically Signed   By: Lavonia Dana M.D.   On: 05/18/2021 08:55    Anti-infectives: Anti-infectives (From admission, onward)    Start     Dose/Rate Route Frequency Ordered Stop   05/17/21 1400  cefTRIAXone (ROCEPHIN) 2 g in sodium chloride 0.9 % 100 mL IVPB  Status:  Discontinued        2 g 200 mL/hr over 30 Minutes Intravenous Every 24 hours 05/17/21 1314 05/19/21 1346         Assessment/Plan  SBO, likely due to intra-abdominal adhesions in the setting of mult abdominal surgeries  - afebrile, vitals stable - CT abd pelvis 2/8 read as SBO with distal transition zone. There are no radiographic or clinical signs of a closed loop obstruction. - SBO protocol 2/9 >> KUB 2/11 with improvement. Xray this am shows NGT is not within stomach - NGT output down to 700 mL out over last 24H (though NGT not currently appropriately positioned) - flatus and +BM (after suppository) - clamp NGT this am and trial clears - if tolerates for 6 hours then NG out this afternoon. If he does not tolerate then NGT back to LIWS after advance 20 cm - discussed with RN - Ambulate - No emergent surgical needs today. Failure to improve with non-operative managemet may warrant laparotomy in the next few days.    CHF CAD s/p PCI 2002 A.fib on pradaxa - hold pradaxa  DM HTN Obesity   FEN: clamp trial/clears ID: ceftriaxone (UTI) VTE: heparin gtt (afib)     LOS: 3 days   I reviewed nursing notes, hospitalist notes, last 24 h vitals and pain scores, last 48 h intake and output, last 24 h labs and trends, and last 24 h imaging results.  This care required moderate level of medical decision making.   Winferd Humphrey, [facility] 05/20/2021, 7:43 AM Please see Amion for pager number during day hours 7:00am-4:30pm

## 2021-05-21 LAB — GLUCOSE, CAPILLARY
Glucose-Capillary: 125 mg/dL — ABNORMAL HIGH (ref 70–99)
Glucose-Capillary: 129 mg/dL — ABNORMAL HIGH (ref 70–99)
Glucose-Capillary: 132 mg/dL — ABNORMAL HIGH (ref 70–99)
Glucose-Capillary: 135 mg/dL — ABNORMAL HIGH (ref 70–99)
Glucose-Capillary: 148 mg/dL — ABNORMAL HIGH (ref 70–99)

## 2021-05-21 LAB — MAGNESIUM: Magnesium: 1.9 mg/dL (ref 1.7–2.4)

## 2021-05-21 LAB — BASIC METABOLIC PANEL
Anion gap: 11 (ref 5–15)
BUN: 17 mg/dL (ref 8–23)
CO2: 20 mmol/L — ABNORMAL LOW (ref 22–32)
Calcium: 8 mg/dL — ABNORMAL LOW (ref 8.9–10.3)
Chloride: 108 mmol/L (ref 98–111)
Creatinine, Ser: 1.18 mg/dL (ref 0.61–1.24)
GFR, Estimated: >60 mL/min (ref >60)
Glucose, Bld: 129 mg/dL — ABNORMAL HIGH (ref 70–99)
Potassium: 3.2 mmol/L — ABNORMAL LOW (ref 3.5–5.1)
Sodium: 139 mmol/L (ref 135–145)

## 2021-05-21 LAB — CBC
HCT: 35.7 % — ABNORMAL LOW (ref 39.0–52.0)
Hemoglobin: 11.3 g/dL — ABNORMAL LOW (ref 13.0–17.0)
MCH: 26.8 pg (ref 26.0–34.0)
MCHC: 31.7 g/dL (ref 30.0–36.0)
MCV: 84.8 fL (ref 80.0–100.0)
Platelets: 156 10*3/uL (ref 150–400)
RBC: 4.21 MIL/uL — ABNORMAL LOW (ref 4.22–5.81)
RDW: 15.4 % (ref 11.5–15.5)
WBC: 6.1 10*3/uL (ref 4.0–10.5)
nRBC: 0 % (ref 0.0–0.2)

## 2021-05-21 LAB — HEPARIN LEVEL (UNFRACTIONATED): Heparin Unfractionated: 0.49 IU/mL (ref 0.30–0.70)

## 2021-05-21 LAB — BRAIN NATRIURETIC PEPTIDE: B Natriuretic Peptide: 405.6 pg/mL — ABNORMAL HIGH (ref 0.0–100.0)

## 2021-05-21 MED ORDER — BOOST / RESOURCE BREEZE PO LIQD CUSTOM
1.0000 | Freq: Two times a day (BID) | ORAL | Status: DC
  Administered 2021-05-21 – 2021-05-22 (×3): 1 via ORAL

## 2021-05-21 MED ORDER — POTASSIUM CHLORIDE CRYS ER 20 MEQ PO TBCR
40.0000 meq | EXTENDED_RELEASE_TABLET | Freq: Three times a day (TID) | ORAL | Status: AC
  Administered 2021-05-21 – 2021-05-22 (×3): 40 meq via ORAL
  Filled 2021-05-21 (×3): qty 2

## 2021-05-21 NOTE — Progress Notes (Signed)
Subjective: CC: Feeling much better. NGT out. Tolerating cld. No n/v. Less burping/belching. Abdominal pain resolved and he does not feel bloated or distended. Abdomen feels at baseline. Passing flatus. Diarrhea x 3-4 since midnight.   Objective: Vital signs in last 24 hours: Temp:  [97.4 F (36.3 C)-97.6 F (36.4 C)] 97.6 F (36.4 C) (02/13 0714) Pulse Rate:  [63-91] 63 (02/13 0714) Resp:  [16-19] 16 (02/13 0714) BP: (123-152)/(49-70) 136/57 (02/13 0714) SpO2:  [98 %-100 %] 100 % (02/13 0714) Last BM Date: 05/21/20  Intake/Output from previous day: 02/12 0701 - 02/13 0700 In: 240 [P.O.:240] Out: -  Intake/Output this shift: No intake/output data recorded.  PE: Gen:  Alert, NAD, pleasant Pulm: Normal rate and effort normal Abd: Protuberant abdomen that is soft, ND, NT, +BS. Prior midline scar well healed.  Psych: A&Ox3  Skin: no rashes noted, warm and dry  Lab Results:  Recent Labs    05/20/21 0057 05/21/21 0834  WBC 7.1 6.1  HGB 11.5* 11.3*  HCT 37.7* 35.7*  PLT 179 156   BMET Recent Labs    05/20/21 0057 05/21/21 0834  NA 140 139  K 3.5 3.2*  CL 104 108  CO2 24 20*  GLUCOSE 137* 129*  BUN 21 17  CREATININE 1.20 1.18  CALCIUM 8.3* 8.0*   PT/INR No results for input(s): LABPROT, INR in the last 72 hours. CMP     Component Value Date/Time   NA 139 05/21/2021 0834   NA 141 06/03/2019 1543   K 3.2 (L) 05/21/2021 0834   CL 108 05/21/2021 0834   CO2 20 (L) 05/21/2021 0834   GLUCOSE 129 (H) 05/21/2021 0834   BUN 17 05/21/2021 0834   BUN 25 06/03/2019 1543   CREATININE 1.18 05/21/2021 0834   CALCIUM 8.0 (L) 05/21/2021 0834   PROT 7.6 05/16/2021 2159   ALBUMIN 4.5 05/16/2021 2159   AST 16 05/16/2021 2159   ALT 11 05/16/2021 2159   ALKPHOS 48 05/16/2021 2159   BILITOT 0.9 05/16/2021 2159   GFRNONAA >60 05/21/2021 0834   GFRAA >60 12/17/2019 0456   Lipase     Component Value Date/Time   LIPASE 21 05/16/2021 2159     Studies/Results: DG Abd Portable 1V  Result Date: 05/20/2021 CLINICAL DATA:  86 year old male with history of abdominal distension. EXAM: PORTABLE ABDOMEN - 1 VIEW COMPARISON:  Abdominal radiograph 05/19/2021. FINDINGS: Some gas and stool is noted throughout the colon and rectum. Several dilated loops of small bowel are noted in the abdomen, measuring up to 4.7 cm in the right-side of the abdomen. No pneumoperitoneum noted on these supine images. Nasogastric tube is noted, with tip in the distal third of the esophagus. IMPRESSION: 1. The appearance of the abdomen again suggests partial small bowel obstruction. 2. Nasogastric tube is in a high position in the distal esophagus and should be advanced approximately 20 cm for more optimal placement. Electronically Signed   By: Vinnie Langton M.D.   On: 05/20/2021 09:04   DG Abd Portable 1V  Result Date: 05/19/2021 CLINICAL DATA:  Small-bowel obstruction.  Abdominal pain. EXAM: PORTABLE ABDOMEN - 1 VIEW COMPARISON:  05/18/2021 FINDINGS: Orogastric or nasogastric tube has its tip in the fundus of the stomach. Few mildly prominent loops of small intestine, improved since yesterday's imaging. Gas present within the colon. IMPRESSION: Partial small bowel obstruction with improved radiographic appearance. Orogastric or nasogastric tube tip in the stomach. Electronically Signed   By: Jan Fireman.D.  On: 05/19/2021 19:24    Anti-infectives: Anti-infectives (From admission, onward)    Start     Dose/Rate Route Frequency Ordered Stop   05/17/21 1400  cefTRIAXone (ROCEPHIN) 2 g in sodium chloride 0.9 % 100 mL IVPB  Status:  Discontinued        2 g 200 mL/hr over 30 Minutes Intravenous Every 24 hours 05/17/21 1314 05/19/21 1346        Assessment/Plan  SBO - Clinically improving. Tolerating cld without abdominal pain, distension or n/v. Has ROBF. Advance diet as tolerated. If tolerates soft diet, can be d/c'd from our standpoint.    FEN: FLD >>  ADAT ID: None currently.  VTE: heparin gtt (afib)  CHF CAD s/p PCI 2002 A.fib on pradaxa - hold pradaxa  DM HTN Obesity   This care required straightforward level of medical decision making.    LOS: 4 days    Jillyn Ledger , [facility] 05/21/2021, 10:42 AM Please see Amion for pager number during day hours 7:00am-4:30pm

## 2021-05-21 NOTE — TOC Initial Note (Signed)
Transition of Care [facility]) - Initial/Assessment Note    Patient Details  Name: Shawn Blanchard MRN: [id number] Date of Birth: June 29, 1935  Transition of Care [facility] CM/SW Contact:    Marilu Favre, RN Phone Number: 05/21/2021, 11:20 AM  Clinical Narrative:                  Spoke to patient and family at bedside.   Patient would like to go to [facility] , [address] [place], [place] ,  Phone [phone] , fax [phone]   For OP PT.    NCM called same they already for his information on file , do not need a paper referral , just need last MD note and PT note faxed to them at [phone]. Same done.   Patient has an appointment May 28, 2021 at 1130 am. Information placed on AVS.  Expected Discharge Plan: Home/Self Care Barriers to Discharge: Continued Medical Work up   Patient Goals and CMS Choice Patient states their goals for this hospitalization and ongoing recovery are:: to return to home CMS Medicare.gov Compare Post Acute Care list provided to:: Patient Choice offered to / list presented to : Patient  Expected Discharge Plan and Services Expected Discharge Plan: Home/Self Care   Discharge Planning Services: CM Consult Post Acute Care Choice: NA (OP PT) Living arrangements for the past 2 months: Single Family Home                 DME Arranged: N/A DME Agency: NA       HH Arranged: NA          Prior Living Arrangements/Services Living arrangements for the past 2 months: Single Family Home Lives with:: Spouse Patient language and need for interpreter reviewed:: Yes        Need for Family Participation in Patient Care: Yes (Comment) Care giver support system in place?: Yes (comment) Current home services: DME Criminal Activity/Legal Involvement Pertinent to Current Situation/Hospitalization: No - Comment as needed  Activities of Daily Living      Permission Sought/Granted   Permission  granted to share information with : Yes, Verbal Permission Granted  Share Information with NAME: spouse [name]           Emotional Assessment Appearance:: Appears stated age Attitude/Demeanor/Rapport: Engaged Affect (typically observed): Accepting Orientation: : Oriented to Self, Oriented to Place, Oriented to  Time, Oriented to Situation Alcohol / Substance Use: Not Applicable Psych Involvement: No (comment)  Admission diagnosis:  SBO (small bowel obstruction) ([place]) [K56.609] Patient Active Problem List   Diagnosis Date Noted   SBO (small bowel obstruction) ([place]) 05/17/2021   Hypertensive urgency 05/17/2021   UTI (urinary tract infection) 05/17/2021   Normocytic anemia 05/17/2021   History of lymphoma 05/17/2021   Iron deficiency anemia 04/27/2021   History of squamous cell carcinoma in situ 04/18/2021   Statin intolerance 03/09/2021   Peritoneal adhesion 11/08/2020   Peripheral venous insufficiency 11/08/2020   Type 2 diabetes mellitus with stage 3a chronic kidney disease, without long-term current use of insulin ([place]) 02/22/2020   Old MI (myocardial infarction)    Floppy eyelid syndrome of both eyes 10/05/2019   Sensorineural hearing loss (SNHL), bilateral 10/04/2019   Stage 3a chronic kidney disease ([place]) 06/13/2019   Carpal tunnel syndrome of right wrist 03/25/2019   Diabetic polyneuropathy associated with type 2 diabetes mellitus ([place]) 12/16/2018   Blepharitis of both upper and lower eyelid 10/02/2018  Meibomian gland dysfunction (MGD) of both eyes 10/02/2018   Essential hypertension 11/07/2017   Orthostatic dizziness 09/15/2017   Dermatochalasis of both upper eyelids 11/21/2016   Chronic anticoagulation 10/11/2016   Male erectile disorder 06/17/2016   Hyperlipidemia 04/11/2016   Small bowel obstruction ([place]) 04/11/2016   BMI 35.0-35.9,adult 04/11/2016   Leukocytosis 04/11/2016   Arthritis 03/12/2016   Pseudophakia of both eyes 03/12/2016   Insomnia 11/27/2015    Ganglion cyst 11/27/2015   Chronic diastolic heart failure ([place]) 09/01/2015   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[place]) 08/30/2015   Obstructive sleep apnea 08/30/2015   Pulmonary hypertension ([place]) 08/30/2015   Restless leg syndrome 08/30/2015   Chronic atrial fibrillation ([place]) 06/12/2015   Gastroesophageal reflux disease without esophagitis 06/12/2015   Idiopathic chronic gout 05/17/2015   Coronary artery disease involving native coronary artery of native heart with angina pectoris ([place]) 02/15/2015   Hypertensive heart disease with heart failure ([place]) 02/15/2015   Type 2 diabetes mellitus with neurologic complication, without long-term current use of insulin ([place]) 12/02/2014   PCP:  Haydee Salter, MD Pharmacy:   [facility] [place] - [address] [place] [address] Phone: [phone] Fax: [phone]     Social Determinants of Health (SDOH) Interventions    Readmission Risk Interventions No flowsheet data found.

## 2021-05-21 NOTE — Progress Notes (Addendum)
Headache  PROGRESS NOTE  RAGE BEEVER VPX:[id number] DOB: [birth year] DOA: 05/16/2021 PCP: Haydee Salter, MD  HPI/Recap of past 24 hours: Shawn Blanchard is a 86 y.o. 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  Associated with nausea without vomiting.  Last bowel movement was 2 days prior to presentation and was watery.  His last obstruction occurred back in 12/2019, and was able to be managed medically.  Work-up revealed SBO seen on CT scan.  NG tube to suction was placed in the ED.  SBO protocol in place.  Ongoing serial abdominal x-ray.    Abdominal x-ray done 2/10 personally reviewed showed persistent dilatation of small bowel loops consistent with small bowel obstruction without oral contrast in the descending colon.  Repeat abdominal x-ray on 05/19/2021 showed improvement of SBO.  Had bowel movement and was tolerating a clear liquid diet for at least 6 hours, NG tube removed on 05/20/2021.  05/21/21: Seen and examined at his bedside.  Endorses 4 bowel movements overnight.  Seen by general surgery, diet advanced to full liquid.  He wants to go home.  Possible discharge to home in the next 24 to 48 hours if tolerates a diet and continues to have bowel function.  Assessment/Plan: Principal Problem:   SBO (small bowel obstruction) (HCC) Active Problems:   Chronic atrial fibrillation (HCC)   Type 2 diabetes mellitus with neurologic complication, without long-term current use of insulin (HCC)   BMI 35.0-35.9,adult   Leukocytosis   Hypertensive urgency   UTI (urinary tract infection)   Normocytic anemia   History of lymphoma  Resolved SBO  (small bowel obstruction) ([place])- (present on admission) Patient present with complaints of abdominal pain x1 day.   CT scan noted dilated proximal small bowel with decompressed distal small bowel consistent with SBO.   Notes prior history of several occurrences of the same stemming from small bowel lymphoma back in 1990s requiring surgery and chemotherapy.   Last small bowel obstruction occurred back in 12/2019 and resolved with conservative management.   SBO has resolved. Started on clear liquid diet 05/20/2021 and tolerated well and NG tube removed. Diet advanced to full liquid on 05/21/2021. Continue to monitor, switch to home Eliquis and DC heparin if continues to tolerate a diet.  Hypokalemia Serum potassium 3.2 with goal of >4.0. Replace orally with KCl 40 meq x 3 doses. Magnesium 1.9 Repeat BMP in the morning.  Ruled out Presumptive UTI (urinary tract infection)- (present on admission) Patient reports that he has been having some mild burning with urination which is new.  Urinalysis was positive for small leukocytes, rare bacteria, and and 6-10 WBCs. Urine culture multiple species, suggest recollection. -Rocephin IV, since 05/17/2021, ended 05/19/21.   Resolved hypertensive urgency- (present on admission) Resume home oral antihypertensives once can tolerate a diet. Restart home Coreg. Hold off p.o. Lasix for now to avoid dehydration. Continue to closely monitor vital signs.   Resolved leukocytosis- (present on admission) Possibly reactive in the setting of small bowel obstruction versus presumptive UTI. On admission WBC elevated at 12.6.,  WBC down trending 8.7 from 10.7. Continue to monitor WBC and fever curve.   Chronic atrial fibrillation ([place])- (present on admission) Patient  appears to be relatively rate controlled.   Home medications include Pradaxa and Coreg 3.125 mg twice daily Restarted Coreg on 05/20/2021 as he is tolerating a clear liquid diet.   Pradaxa on hold until no  surgical procedure planned.   Continue heparin drip for now.    Chronic systolic CHF Euvolemic on exam. Last 2D echo done on 12/16/2019 showed LVEF 45 to 50% Closely monitor volume status while on IV fluid hydration. Hold off home oral Lasix for now to avoid dehydration. We will start oral Lasix tomorrow 05/21/2021 as he continues to tolerate a diet. BNP 405 on 05/21/2021.  DC IV fluid for now as he continues to tolerate a diet.   BMI 35.0-35.9,adult BMI 35.73 kg/m Recommend weight loss outpatient with regular physical activity and healthy dieting.   Type 2 diabetes mellitus with neurologic complication, without long-term current use of insulin ([name])- (present on admission) Hemoglobin A1c 7.5 on 03/09/2021. Continue to hold off home oral hypoglycemics. Continue insulin sliding scale every 6 hours while NPO. Continue to avoid hypoglycemia.  Physical debility PT recommended outpatient PT Appreciate TOC's assistance with arrangement of outpatient PT. Continue PT OT with assistance and fall precautions.     Advance Care Planning:   Code Status: Full Code    Consults: General surgery   Family Communication: Updated his 2 daughters, individually, at bedside on 05/20/2021.    Code Status: Full code.    Disposition Plan: Likely will discharge to home once general surgery signs off.   Consultants: General surgery.  Procedures: NG tube placement. NG tube removed on 05/28/2021.  Antimicrobials: Rocephin started on 05/17/2021 for presumptive UTI, ended 05/19/2021.  DVT prophylaxis: Heparin drip  Status is: Inpatient  Patient requires at least 2 midnights for further evaluation and treatment of present condition.      Objective: Vitals:   05/20/21 1549 05/20/21 2008 05/21/21 0306 05/21/21 0714  BP: (!) 123/49 140/70 (!) 152/69 (!) 136/57  Pulse: 85 81 91 63  Resp: 19   16  Temp: (!) 97.4 F (36.3 C) (!) 97.5 F (36.4 C) 97.6 F (36.4 C) 97.6 F (36.4 C)  TempSrc:  Oral Oral Oral Oral  SpO2: 100% 98% 100% 100%  Weight:      Height:        Intake/Output Summary (Last 24 hours) at 05/21/2021 1227 Last data filed at 05/20/2021 1508 Gross per 24 hour  Intake 240 ml  Output --  Net 240 ml   Filed Weights   05/17/21 0304 05/18/21 0407 05/19/21 0500  Weight: 116.2 kg 119.2 kg 120 kg    Exam:  General: 86 y.o. year-old male obese in no acute distress.  He is alert and oriented x3.   Cardiovascular: Irregular rate and rhythm no rubs or gallops. Respiratory: Clear to auscultation no wheezes or rales. Abdomen: Obese bowel sounds present.   Musculoskeletal: Trace lower extremity edema bilaterally. Skin: Mild lower extremity hyperpigmentation. Psychiatry: Mood is appropriate for condition. Neuro: Moves all 4 extremities.   Data Reviewed: CBC: Recent Labs  Lab 05/16/21 2159 05/18/21 0641 05/19/21 0208 05/20/21 0057 05/21/21 0834  WBC 12.6* 10.7* 8.7 7.1 6.1  HGB 12.7* 12.7* 12.2* 11.5* 11.3*  HCT 40.3 40.5 38.5* 37.7* 35.7*  MCV 83.6 84.9 85.6 87.3 84.8  PLT 232 228 192 179 357   Basic Metabolic Panel: Recent Labs  Lab 05/16/21 2159 05/18/21 0641 05/19/21 0208 05/20/21 0057 05/21/21 0834  NA 138 140 140 140 139  K 4.5 4.3 4.2 3.5 3.2*  CL 102 101 103 104 108  CO2 24 26 25 24  20*  GLUCOSE 170* 189* 153* 137* 129*  BUN 20 19 20 21 17   CREATININE 1.12 1.13 1.22 1.20 1.18  CALCIUM 9.9 9.0 8.6* 8.3* 8.0*  MG  --  2.2  --  2.0 1.9  PHOS  --  3.6  --   --   --    GFR: Estimated Creatinine Clearance: 62.5 mL/min (by C-G formula based on SCr of 1.18 mg/dL). Liver Function Tests: Recent Labs  Lab 05/16/21 2159  AST 16  ALT 11  ALKPHOS 48  BILITOT 0.9  PROT 7.6  ALBUMIN 4.5   Recent Labs  Lab 05/16/21 2159  LIPASE 21   No results for input(s): AMMONIA in the last 168 hours. Coagulation Profile: No results for input(s): INR, PROTIME in the last 168 hours. Cardiac Enzymes: No results for input(s): CKTOTAL, CKMB,  CKMBINDEX, TROPONINI in the last 168 hours. BNP (last 3 results) No results for input(s): PROBNP in the last 8760 hours. HbA1C: No results for input(s): HGBA1C in the last 72 hours. CBG: Recent Labs  Lab 05/20/21 1707 05/21/21 0058 05/21/21 0543 05/21/21 0632 05/21/21 1129  GLUCAP 194* 135* 125* 129* 148*   Lipid Profile: No results for input(s): CHOL, HDL, LDLCALC, TRIG, CHOLHDL, LDLDIRECT in the last 72 hours. Thyroid Function Tests: No results for input(s): TSH, T4TOTAL, FREET4, T3FREE, THYROIDAB in the last 72 hours. Anemia Panel: No results for input(s): VITAMINB12, FOLATE, FERRITIN, TIBC, IRON, RETICCTPCT in the last 72 hours. Urine analysis:    Component Value Date/Time   COLORURINE YELLOW 05/16/2021 2159   APPEARANCEUR CLEAR 05/16/2021 2159   LABSPEC 1.021 05/16/2021 2159   PHURINE 5.0 05/16/2021 2159   GLUCOSEU NEGATIVE 05/16/2021 2159   GLUCOSEU >=1000 (A) 11/09/2020 0757   HGBUR NEGATIVE 05/16/2021 2159   [place] NEGATIVE 05/16/2021 2159   [place] NEGATIVE 05/16/2021 2159   PROTEINUR 30 (A) 05/16/2021 2159   UROBILINOGEN 0.2 11/09/2020 0757   NITRITE NEGATIVE 05/16/2021 2159   LEUKOCYTESUR SMALL (A) 05/16/2021 2159   Sepsis Labs: @LABRCNTIP (procalcitonin:4,lacticidven:4)  ) Recent Results (from the past 240 hour(s))  Urine Culture     Status: Abnormal   Collection Time: 05/17/21  1:46 PM   Specimen: Urine, Clean Catch  Result Value Ref Range Status   Specimen Description URINE, CLEAN CATCH  Final   Special Requests   Final    NONE Performed at [facility], [place] [address], [place], [place] [address]    Culture MULTIPLE SPECIES PRESENT, SUGGEST RECOLLECTION (A)  Final   Report Status 05/18/2021 FINAL  Final      Studies: No results found.  Scheduled Meds:  acetaminophen  1,000 mg Oral Q6H   carvedilol  3.125 mg Oral BID WC   docusate sodium  100 mg Oral BID   feeding supplement  1 Container Oral BID BM   insulin aspart  0-6 Units  Subcutaneous Q6H   mouth rinse  15 mL Mouth Rinse BID   pantoprazole (PROTONIX) IV  40 mg Intravenous Q12H   sodium chloride flush  3 mL Intravenous Q12H    Continuous Infusions:  sodium chloride 50 mL/hr at 05/21/21 1225   heparin 1,900 Units/hr (05/21/21 1223)     LOS: 4 days     Kayleen Memos, MD [facility] Pager [phone]  If 7PM-7AM, please contact night-coverage [email or web address] Password [facility] 05/21/2021, 12:27 PM

## 2021-05-21 NOTE — Progress Notes (Signed)
ANTICOAGULATION CONSULT NOTE  Pharmacy Consult: IV Heparin Indication: atrial fibrillation  Allergies  Allergen Reactions   Atorvastatin Other (See Comments)    Dizziness.   Jardiance [Empagliflozin] Other (See Comments)    Orthostasis   Sulfa Antibiotics    Ace Inhibitors Rash   Amoxicillin Rash   Meperidine Rash   Meperidine Hcl Rash   Naproxen Sodium Rash   Sulfamethoxazole Rash   Tetracycline Rash    Patient Measurements: Height: 5\' 11"  (180.3 cm) Weight: 120 kg (264 lb 8.8 oz) IBW/kg (Calculated) : 75.3 Heparin Dosing Weight: 100 kg  Vital Signs: Temp: 97.6 F (36.4 C) (02/13 0714) Temp Source: Oral (02/13 0714) BP: 136/57 (02/13 0714) Pulse Rate: 63 (02/13 0714)  Labs: Recent Labs    05/19/21 0208 05/20/21 0057 05/21/21 0834  HGB 12.2* 11.5* 11.3*  HCT 38.5* 37.7* 35.7*  PLT 192 179 156  HEPARINUNFRC 0.63 0.70 0.49  CREATININE 1.22 1.20 1.18    Estimated Creatinine Clearance: 62.5 mL/min (by C-G formula based on SCr of 1.18 mg/dL).  Assessment: 86 yr old man presented with abdominal pain and was found to have SBO, likely due to intra-abdominal adhesions in setting of multiple abdominal surgeries.  Pharmacy was consulted to transition patient from Pradaxa (last dose 2/8 AM) to IV heparin for history of Afib.    Heparin level therapeutic at 0.49 units/mL on 1900 units/hr. CBC stable. No s/sx of bleeding or infusion issues.  Goal of Therapy:  Heparin level 0.3-0.7 units/ml Monitor platelets by anticoagulation protocol: Yes   Plan:  Continue heparin gtt at 1900 units/hr Daily heparin level and CBC F/u with resuming Pradaxa when possible  Dorman Calderwood D. Mina Marble, PharmD, BCPS, [place] 05/21/2021, 10:41 AM

## 2021-05-21 NOTE — Progress Notes (Signed)
Mobility Specialist Progress Note:   05/21/21 1122  Mobility  Bed Position Chair  Activity Ambulated with assistance in room  Level of Assistance Standby assist, set-up cues, supervision of patient - no hands on  Assistive Device Front wheel walker  Distance Ambulated (ft) 20 ft  Activity Response Tolerated well  $Mobility charge 1 Mobility   Pt received in bathroom, able to have BM. Required peri-care. Pt left in chair with call bell in reach and all needs met. Pt tired but willing to ambulate further after lunch. Will follow-up as time allows.    [facility] Public librarian Phone [phone]

## 2021-05-21 NOTE — Plan of Care (Signed)
  Problem: Nutrition: Goal: Adequate nutrition will be maintained Outcome: Progressing   Problem: Pain Managment: Goal: General experience of comfort will improve Outcome: Progressing   

## 2021-05-21 NOTE — Progress Notes (Signed)
Mobility Specialist Progress Note: ° ° 05/21/21 1352  °Mobility  °Activity Ambulated with assistance in hallway  °Level of Assistance Standby assist, set-up cues, supervision of patient - no hands on  °Assistive Device Front wheel walker  °Distance Ambulated (ft) 250 ft  °Activity Response Tolerated well  °$Mobility charge 1 Mobility  ° °Pt received in chair willing to participate in mobility. No complaints of pain. Ambulated to bathroom and was able to have a BM. Pt left in bed with call bell in reach, all needs met, and family present.  ° °[place] Day °Mobility Specialist °Primary Phone [phone] ° °

## 2021-05-21 NOTE — Progress Notes (Signed)
Occupational Therapy Treatment Patient Details Name: Shawn Blanchard MRN: [id number] DOB: Apr 27, 1935 Today's Date: 05/21/2021   History of present illness Pt. is 86 yr old M admitted on 05/16/21 with c/o abdominal pain.  Imaging (+) for SBO. PMH: CHF, small bowel CA with hx of multiple SBO, arthritis, CTS, A-fib, DM, neuropathy, gout, MI, OSA.   OT comments  Pt ambulated with min guard assist for grooming. Assist needed for pericare and barrier cream, reports having diarrhea. Pt in good spirits, needs cues for safety and processing.    Recommendations for follow up therapy are one component of a multi-disciplinary discharge planning process, led by the attending physician.  Recommendations may be updated based on patient status, additional functional criteria and insurance authorization.    Follow Up Recommendations  No OT follow up    Assistance Recommended at Discharge Intermittent Supervision/Assistance  Patient can return home with the following  A little help with walking and/or transfers;A little help with bathing/dressing/bathroom;Direct supervision/assist for medications management;Direct supervision/assist for financial management;Assist for transportation;Help with stairs or ramp for entrance   Equipment Recommendations  None recommended by OT    Recommendations for Other Services      Precautions / Restrictions Precautions Precautions: Fall Precaution Comments: h/o dizziness and falls       Mobility Bed Mobility               General bed mobility comments: in chair    Transfers Overall transfer level: Needs assistance Equipment used: Rolling walker (2 wheels)   Sit to Stand: Supervision                 Balance Overall balance assessment: Mild deficits observed, not formally tested   Sitting balance-Leahy Scale: Good       Standing balance-Leahy Scale: Poor Standing balance comment: reliant on RW                           ADL  either performed or assessed with clinical judgement   ADL Overall ADL's : Needs assistance/impaired     Grooming: Min guard;Standing;Oral care                       Toileting- Clothing Manipulation and Hygiene: Total assistance       Functional mobility during ADLs: Min guard;Rolling walker (2 wheels)      Extremity/Trunk Assessment              Vision       Perception     Praxis      Cognition Arousal/Alertness: Awake/alert Behavior During Therapy: WFL for tasks assessed/performed Overall Cognitive Status: Within Functional Limits for tasks assessed Area of Impairment: Memory, Problem solving                     Memory: Decreased short-term memory       Problem Solving: Slow processing, Requires verbal cues (cues for how to turn water on) General Comments: likely at his baseline        Exercises      Shoulder Instructions       General Comments      Pertinent Vitals/ Pain       Pain Assessment Pain Assessment: Faces Faces Pain Scale: Hurts little more Pain Location: rectum Pain Descriptors / Indicators: Burning Pain Intervention(s): Monitored during session, Other (comment) (applied barrier cream)  Home Living  Prior Functioning/Environment              Frequency  Min 2X/week        Progress Toward Goals  OT Goals(current goals can now be found in the care plan section)  Progress towards OT goals: Progressing toward goals  Acute Rehab OT Goals OT Goal Formulation: With patient Time For Goal Achievement: 06/01/21 Potential to Achieve Goals: Good  Plan Discharge plan remains appropriate    Co-evaluation                 AM-PAC OT "6 Clicks" Daily Activity     Outcome Measure   Help from another person eating meals?: None Help from another person taking care of personal grooming?: A Little Help from another person toileting, which includes  using toliet, bedpan, or urinal?: A Lot Help from another person bathing (including washing, rinsing, drying)?: A Little Help from another person to put on and taking off regular upper body clothing?: A Little Help from another person to put on and taking off regular lower body clothing?: A Little 6 Click Score: 18    End of Session Equipment Utilized During Treatment: Rolling walker (2 wheels)  OT Visit Diagnosis: Unsteadiness on feet (R26.81);Other abnormalities of gait and mobility (R26.89)   Activity Tolerance Patient tolerated treatment well   Patient Left in chair;with call bell/phone within reach;with family/visitor present   Nurse Communication          Time: 0932-6712 OT Time Calculation (min): 28 min  Charges: OT General Charges $OT Visit: 1 Visit OT Treatments $Self Care/Home Management : 23-37 mins  Nestor Lewandowsky, OTR/L Acute Rehabilitation Services Pager: [phone] Office: [phone]   Malka So 05/21/2021, 12:27 PM

## 2021-05-21 NOTE — Care Management Important Message (Signed)
Important Message  Patient Details  Name: Shawn Blanchard MRN: [id number] Date of Birth: 08-Feb-1936   Medicare Important Message Given:  Yes     Hannah Beat 05/21/2021, 12:18 PM

## 2021-05-22 LAB — CBC
HCT: 35 % — ABNORMAL LOW (ref 39.0–52.0)
Hemoglobin: 10.7 g/dL — ABNORMAL LOW (ref 13.0–17.0)
MCH: 26.2 pg (ref 26.0–34.0)
MCHC: 30.6 g/dL (ref 30.0–36.0)
MCV: 85.8 fL (ref 80.0–100.0)
Platelets: 154 10*3/uL (ref 150–400)
RBC: 4.08 MIL/uL — ABNORMAL LOW (ref 4.22–5.81)
RDW: 15.3 % (ref 11.5–15.5)
WBC: 5.6 10*3/uL (ref 4.0–10.5)
nRBC: 0 % (ref 0.0–0.2)

## 2021-05-22 LAB — GLUCOSE, CAPILLARY
Glucose-Capillary: 112 mg/dL — ABNORMAL HIGH (ref 70–99)
Glucose-Capillary: 134 mg/dL — ABNORMAL HIGH (ref 70–99)
Glucose-Capillary: 184 mg/dL — ABNORMAL HIGH (ref 70–99)

## 2021-05-22 LAB — BASIC METABOLIC PANEL
Anion gap: 10 (ref 5–15)
BUN: 13 mg/dL (ref 8–23)
CO2: 19 mmol/L — ABNORMAL LOW (ref 22–32)
Calcium: 8 mg/dL — ABNORMAL LOW (ref 8.9–10.3)
Chloride: 107 mmol/L (ref 98–111)
Creatinine, Ser: 1.11 mg/dL (ref 0.61–1.24)
GFR, Estimated: >60 mL/min (ref >60)
Glucose, Bld: 128 mg/dL — ABNORMAL HIGH (ref 70–99)
Potassium: 3.9 mmol/L (ref 3.5–5.1)
Sodium: 136 mmol/L (ref 135–145)

## 2021-05-22 LAB — HEPARIN LEVEL (UNFRACTIONATED): Heparin Unfractionated: 0.52 IU/mL (ref 0.30–0.70)

## 2021-05-22 MED ORDER — SENNOSIDES-DOCUSATE SODIUM 8.6-50 MG PO TABS
2.0000 | ORAL_TABLET | Freq: Two times a day (BID) | ORAL | Status: DC
  Filled 2021-05-22: qty 2

## 2021-05-22 MED ORDER — DABIGATRAN ETEXILATE MESYLATE 150 MG PO CAPS
150.0000 mg | ORAL_CAPSULE | Freq: Two times a day (BID) | ORAL | Status: DC
  Administered 2021-05-22: 150 mg via ORAL
  Filled 2021-05-22 (×2): qty 1

## 2021-05-22 MED ORDER — POLYETHYLENE GLYCOL 3350 17 G PO PACK: 17.0000 g | PACK | Freq: Every day | ORAL | 0 refills | Status: DC | PRN

## 2021-05-22 NOTE — Discharge Summary (Signed)
Discharge Summary  Shawn Blanchard UXN:[id number] DOB: 1935-12-27  PCP: Haydee Salter, MD  Admit date: 05/16/2021 Discharge date: 05/22/2021  Time spent: 35 minutes.  Recommendations for Outpatient Follow-up:  Follow-up with your primary care provider. Follow up with your cardiologist. Take your medications as prescribed. Continue PT OT with assistance and fall precautions.  Discharge Diagnoses:  Active Hospital Problems   Diagnosis Date Noted   SBO (small bowel obstruction) ([place]) 05/17/2021   Hypertensive urgency 05/17/2021   UTI (urinary tract infection) 05/17/2021   Normocytic anemia 05/17/2021   History of lymphoma 05/17/2021   Leukocytosis 04/11/2016   BMI 35.0-35.9,adult 04/11/2016   Chronic atrial fibrillation ([place]) 06/12/2015   Type 2 diabetes mellitus with neurologic complication, without long-term current use of insulin ([place]) 12/02/2014    Resolved Hospital Problems  No resolved problems to display.    Discharge Condition: Stable  Diet recommendation: Resume previous diet.  Vitals:   05/21/21 1948 05/22/21 0324  BP: (!) 146/62 128/75  Pulse: 81 82  Resp: 16 17  Temp: 97.9 F (36.6 C)   SpO2: 98% 98%    History of present illness:  Shawn Blanchard is a 86 y.o. 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  Associated with nausea without vomiting.  Last bowel movement was 2 days prior to presentation and was watery.  His last obstruction occurred back in 12/2019, and was able to be managed medically.  Work-up revealed SBO seen on CT scan.  NG tube to suction was placed in the ED.  SBO protocol was in  place and patient was seen by general surgery.    Abdominal x-ray done 05/18/21 showed persistent dilatation of small bowel loops consistent with persistent small bowel obstruction.   Repeat abdominal x-ray on 05/19/2021 showed improvement of SBO.  Had bowel movement and was tolerating a clear liquid diet for at least 6 hours, NG tube was removed on 05/20/2021.  He continued to tolerate a diet on 05/21/2021, his diet was advanced.  He had 4 bowel movements on 05/22/2021.  05/22/2021: Patient was seen at his bedside.  There were no acute events overnight.  Has no new complaints.  Okay to discharge from general surgery standpoint.  Patient wants to go home and is eager to go home.    Hospital Course:  Principal Problem:   SBO (small bowel obstruction) (HCC) Active Problems:   Chronic atrial fibrillation (HCC)   Type 2 diabetes mellitus with neurologic complication, without long-term current use of insulin (HCC)   BMI 35.0-35.9,adult   Leukocytosis   Hypertensive urgency   UTI (urinary tract infection)   Normocytic anemia   History of lymphoma  Resolved SBO (small bowel obstruction) ([place])- (present on admission) Patient present with complaints of abdominal pain x1 day.  Associated with nausea and no vomiting.  CT scan noted dilated proximal small bowel with decompressed distal small bowel consistent with SBO.   SBO has resolved. Started on clear liquid diet 05/20/2021 and tolerated well and NG tube removed. Diet advanced to full liquid on 05/21/2021, then to soft diet. He continued to tolerate a diet on 05/21/2021, his diet was advanced.  He had 4 bowel movements on  05/22/2021.   Resolved post repletion: Hypokalemia Serum potassium 3.9 from 3.2    Ruled out Presumptive UTI (urinary tract infection)- (present on admission) Patient reports that he has been having some mild burning with urination which is new.  Urinalysis was positive for small leukocytes, rare bacteria, and and 6-10 WBCs. Urine  culture multiple species, suggested recollection. -Rocephin IV, since 05/17/2021, ended 05/19/21. No further complaints.   Resolved hypertensive urgency- (present on admission) Resume home oral antihypertensives  Follow-up with your cardiologist Follow-up with your primary care provider.   Resolved leukocytosis- (present on admission) Suspected reactive in the setting of small bowel obstruction. On admission WBC elevated at 12.6.,  WBC normalized 8.7 from 10.7. Afebrile, nonseptic in appearance.   Chronic atrial fibrillation ([place])- (present on admission) Patient appears to be relatively rate controlled.   Home medications include Pradaxa and Coreg 3.125 mg twice daily Continue home regimen Follow-up with your cardiologist.   Chronic systolic CHF Euvolemic on exam. Resume home regimen. Follow-up with your cardiologist.   BMI 35.0-35.9,adult BMI 35.73 kg/m Recommend weight loss outpatient with regular physical activity and healthy dieting.   Type 2 diabetes mellitus with neurologic complication, without long-term current use of insulin ([place])- (present on admission) Hemoglobin A1c 7.5 on 03/09/2021. Resume home regimen Follow-up with your primary care provider.   Physical debility PT recommended outpatient PT Appreciate TOC's assistance with arrangement of outpatient PT. Continue PT OT with assistance and fall precautions.     Code Status: Full Code    Consults: General surgery  Procedures: NG tube placement. NG tube removed on 05/28/2021.   Antimicrobials: Rocephin started on 05/17/2021 for presumptive UTI, ended 05/19/2021.     Discharge Exam: BP 128/75 (BP Location: Right Arm)    Pulse 82    Temp 97.9 F (36.6 C) (Oral)    Resp 17    Ht 5\' 11"  (1.803 m)    Wt 117.3 kg    SpO2 98%    BMI 36.07 kg/m  General: 86 y.o. year-old male well developed well nourished in no acute distress.  Alert and oriented x3. Cardiovascular: Irregular rate and rhythm with no rubs or  gallops.  No thyromegaly or JVD noted.   Respiratory: Clear to auscultation with no wheezes or rales. Good inspiratory effort. Abdomen: Soft nontender nondistended with normal bowel sounds x4 quadrants. Musculoskeletal: No lower extremity edema bilaterally.   Psychiatry: Mood is appropriate for condition and setting  Discharge Instructions You were cared for by a hospitalist during your hospital stay. If you have any questions about your discharge medications or the care you received while you were in the hospital after you are discharged, you can call the unit and asked to speak with the hospitalist on call if the hospitalist that took care of you is not available. Once you are discharged, your primary care physician will handle any further medical issues. Please note that NO REFILLS for any discharge medications will be authorized once you are discharged, as it is imperative that you return to your primary care physician (or establish a relationship with a primary care physician if you do not have one) for your aftercare needs so that they can reassess your need for medications and monitor your lab values.   Allergies as of 05/22/2021       Reactions   Atorvastatin Other (See Comments)   Dizziness.   Jardiance [empagliflozin] Other (See Comments)   Orthostasis   Sulfa Antibiotics    Ace Inhibitors Rash   Amoxicillin  Rash   Meperidine Rash   Meperidine Hcl Rash   Naproxen Sodium Rash   Sulfamethoxazole Rash   Tetracycline Rash        Medication List     TAKE these medications    Alpha-Lipoic Acid 100 MG Caps Take 100 mg by mouth in the morning and at bedtime.   carvedilol 3.125 MG tablet Commonly known as: COREG TAKE 1 TABLET BY MOUTH 2 TIMES A DAY WITH A MEAL What changed: See the new instructions.   dabigatran 150 MG Caps capsule Commonly known as: PRADAXA TAKE 1 CAPSULE BY MOUTH 2 TIMES DAILY   ezetimibe 10 MG tablet Commonly known as: ZETIA Take 10 mg by mouth  daily.   furosemide 20 MG tablet Commonly known as: LASIX TAKE 1 TABLET BY MOUTH 2 TIMES A DAY What changed: when to take this   glucose blood test strip OneTouch Ultra Blue Test Strip   hydrocortisone 2.5 % ointment Apply 1 application topically 2 (two) times daily as needed (flare ups).   Iron (Ferrous Sulfate) 325 (65 Fe) MG Tabs Take 325 mg by mouth 2 (two) times daily.   Janumet XR [phone] MG Tb24 Generic drug: SitaGLIPtin-MetFORMIN HCl TAKE 1 TABLET BY MOUTH EVERY DAY WITH BREAKFAST (FOR DIABETES) What changed: See the new instructions.   melatonin 3 MG Tabs tablet Take 3 mg by mouth at bedtime.   metFORMIN 500 MG 24 hr tablet Commonly known as: GLUCOPHAGE-XR Take 1 tablet (500 mg total) by mouth daily in the afternoon.   MULTI-VITAMINS PO Take 1 tablet by mouth daily.   nitroGLYCERIN 0.4 MG SL tablet Commonly known as: NITROSTAT PLACE 1 TABLET UNDER TONGUE EVERY 5 MINUTES AS NEEDED FOR CHEST PAIN (UP TO 3 DOSES) What changed: See the new instructions.   OneTouch Delica Plus [id number] Misc OneTouch Delica Plus Lancet 33 gauge   Polyethyl Glycol-Propyl Glycol 0.4-0.3 % Soln Place 1 drop into both eyes 2 (two) times daily as needed (dry eyes).   polyethylene glycol 17 g packet Commonly known as: MiraLax Take 17 g by mouth daily as needed.   polyvinyl alcohol 1.4 % ophthalmic solution Commonly known as: LIQUIFILM TEARS Place 1 drop into both eyes as needed for dry eyes.   Repatha SureClick 945 MG/ML [place] Generic drug: Evolocumab Inject 140 mg into the skin every 14 (fourteen) days.   Turmeric 400 MG Caps Take 400 mg by mouth daily.   VITAMIN B 12 PO Take 1 tablet by mouth daily.       Allergies  Allergen Reactions   Atorvastatin Other (See Comments)    Dizziness.   Jardiance [Empagliflozin] Other (See Comments)    Orthostasis   Sulfa Antibiotics    Ace Inhibitors Rash   Amoxicillin Rash   Meperidine Rash   Meperidine Hcl Rash   Naproxen  Sodium Rash   Sulfamethoxazole Rash   Tetracycline Rash    Follow-up Information     [facility] Follow up.   Why: Monday May 28, 2021 at 1130 am Contact information: [address] [place], [place]   Phone [phone]        Haydee Salter, MD. Call today.   Specialty: Family Medicine Why: Please call for a posthospital follow-up appointment. Contact information: [place] [address] [phone]                  The results of significant  diagnostics from this hospitalization (including imaging, microbiology, ancillary and laboratory) are listed below for reference.    Significant Diagnostic Studies: CT ABDOMEN PELVIS W CONTRAST  Result Date: 05/16/2021 CLINICAL DATA:  Acute nonlocalized abdominal pain. Lower abdominal pain. EXAM: CT ABDOMEN AND PELVIS WITH CONTRAST TECHNIQUE: Multidetector CT imaging of the abdomen and pelvis was performed using the standard protocol following bolus administration of intravenous contrast. RADIATION DOSE REDUCTION: This exam was performed according to the departmental dose-optimization program which includes automated exposure control, adjustment of the mA and/or kV according to patient size and/or use of iterative reconstruction technique. CONTRAST:  136mL OMNIPAQUE IOHEXOL 300 MG/ML  SOLN COMPARISON:  12/14/2019 FINDINGS: Lower chest: Lung bases are clear. Mild cardiac enlargement. Coronary artery calcifications. Hepatobiliary: No focal liver abnormality is seen. Status post cholecystectomy. No biliary dilatation. Pancreas: Unremarkable. No pancreatic ductal dilatation or surrounding inflammatory changes. Spleen: Normal in size without focal abnormality. Adrenals/Urinary Tract: Adrenal glands are unremarkable. Kidneys are normal, without renal calculi, focal lesion, or hydronephrosis. Bladder is unremarkable. Stomach/Bowel: Stomach is  unremarkable. Mid abdominal small bowel anastomosis. Proximal small bowel are dilated and fluid-filled with air-fluid levels. Distal small bowel are decompressed. Transition zone appears to be in the right anterior lower quadrant. Some fecalization of small bowel contents may indicate longstanding stasis. Colon is not abnormally distended. Colonic diverticula without evidence of diverticulitis. Appendix is not seen. Small amount of free fluid in the mesentery. Vascular/Lymphatic: Aortic atherosclerosis. No enlarged abdominal or pelvic lymph nodes. Reproductive: Prostate is unremarkable. Other: Postoperative changes along the midline. No free air or free fluid in the abdomen. Musculoskeletal: Degenerative changes in the spine. IMPRESSION: Dilated proximal small bowel with decompressed distal small bowel consistent with small bowel obstruction. Changes likely to represent adhesions in the setting of previous abdominal surgery. Electronically Signed   By: Lucienne Capers M.D.   On: 05/16/2021 23:25   MR FOOT LEFT WO CONTRAST  Result Date: 05/01/2021 CLINICAL DATA:  Foot swelling, diabetic EXAM: MRI OF THE LEFT FOOT WITHOUT CONTRAST TECHNIQUE: Multiplanar, multisequence MR imaging of the left foot was performed. No intravenous contrast was administered. COMPARISON:  Left foot x-ray 04/30/2021 FINDINGS: Bones/Joint/Cartilage No acute fracture or dislocation identified. No suspicious bone marrow edema identified to suggest osteomyelitis. Degenerative changes at the medial aspect of the first metatarsophalangeal joint including cortical irregularities, tiny cysts and trace edema. Also similar-appearing mild likely degenerative edema in the medial hallux sesamoid bone. Patchy degenerative subchondral cysts and edema also noted in the midfoot most prominent at the medial naviculocuneiform joint. Ligaments No significant abnormality. Muscles and Tendons Edema throughout the intrinsic musculature of the foot, more  prominent at the plantar aspect. Tendons are intact. No evidence of tenosynovitis. Visualized plantar fascia intact. Soft tissues Fairly diffuse subcutaneous soft tissue edema which is most prominent medially and dorsally. No defined fluid collections identified. IMPRESSION: 1. No evidence of acute osteomyelitis. 2. Degenerative changes of the foot most prominent at the medial aspect of the first metatarsophalangeal joint. 3. Subcutaneous soft tissue edema and muscular edema throughout the foot. Correlate for possible cellulitis and/or myositis. Electronically Signed   By: Ofilia Neas M.D.   On: 05/01/2021 19:38   DG CHEST PORT 1 VIEW  Result Date: 05/19/2021 CLINICAL DATA:  Abdominal pain.  Small-bowel obstruction. EXAM: PORTABLE CHEST 1 VIEW COMPARISON:  05/17/2021 FINDINGS: Orogastric or nasogastric tube enters the abdomen. Cardiomegaly as seen previously. The lungs are clear. No infiltrate, collapse or effusion. IMPRESSION: No active disease. Electronically Signed   By: Elta Guadeloupe  Shogry M.D.   On: 05/19/2021 19:23   DG Chest Portable 1 View  Result Date: 05/17/2021 CLINICAL DATA:  Nasogastric tube placement EXAM: PORTABLE CHEST 1 VIEW COMPARISON:  None. FINDINGS: Nasogastric tube is in place with its tip extending into the proximal body of the stomach. Lungs are clear. No pneumothorax or pleural effusion. Cardiac size within normal limits. No acute bone abnormality. IMPRESSION: Nasogastric tube tip within the proximal body of the stomach. Electronically Signed   By: Fidela Salisbury M.D.   On: 05/17/2021 00:46   DG Abd Portable 1V  Result Date: 05/20/2021 CLINICAL DATA:  86 year old male with history of abdominal distension. EXAM: PORTABLE ABDOMEN - 1 VIEW COMPARISON:  Abdominal radiograph 05/19/2021. FINDINGS: Some gas and stool is noted throughout the colon and rectum. Several dilated loops of small bowel are noted in the abdomen, measuring up to 4.7 cm in the right-side of the abdomen. No  pneumoperitoneum noted on these supine images. Nasogastric tube is noted, with tip in the distal third of the esophagus. IMPRESSION: 1. The appearance of the abdomen again suggests partial small bowel obstruction. 2. Nasogastric tube is in a high position in the distal esophagus and should be advanced approximately 20 cm for more optimal placement. Electronically Signed   By: Vinnie Langton M.D.   On: 05/20/2021 09:04   DG Abd Portable 1V  Result Date: 05/19/2021 CLINICAL DATA:  Small-bowel obstruction.  Abdominal pain. EXAM: PORTABLE ABDOMEN - 1 VIEW COMPARISON:  05/18/2021 FINDINGS: Orogastric or nasogastric tube has its tip in the fundus of the stomach. Few mildly prominent loops of small intestine, improved since yesterday's imaging. Gas present within the colon. IMPRESSION: Partial small bowel obstruction with improved radiographic appearance. Orogastric or nasogastric tube tip in the stomach. Electronically Signed   By: Nelson Chimes M.D.   On: 05/19/2021 19:24   DG Abd Portable 1V  Result Date: 05/18/2021 CLINICAL DATA:  Abdominal pain since last Friday EXAM: PORTABLE ABDOMEN - 1 VIEW COMPARISON:  05/17/2021 FINDINGS: Persistent dilatation of small bowel loops consistent with small bowel obstruction. Some gas and stool within colon. Excreted contrast within urinary bladder. No definite bowel wall thickening. Osseous structures unremarkable. Surgical clips RIGHT upper quadrant consistent with history of cholecystectomy. IMPRESSION: Persistent small bowel obstruction. Electronically Signed   By: Lavonia Dana M.D.   On: 05/18/2021 08:55   DG Abd Portable 1V-Small Bowel Obstruction Protocol-initial, 8 hr delay  Result Date: 05/17/2021 CLINICAL DATA:  Small-bowel obstruction EXAM: PORTABLE ABDOMEN - 1 VIEW COMPARISON:  05/17/2021 FINDINGS: Nasogastric tube tip overlies the proximal body of the stomach. Multiple gas-filled dilated loops of small bowel are seen within the mid abdomen in keeping with a mid  to distal small bowel obstruction. No free intraperitoneal gas. IMPRESSION: Nasogastric tube tip within the proximal body of the stomach. Mid to distal small-bowel obstruction. Electronically Signed   By: Fidela Salisbury M.D.   On: 05/17/2021 22:35   DG Abd Portable 1V  Result Date: 05/17/2021 CLINICAL DATA:  SBO EXAM: PORTABLE ABDOMEN - 1 VIEW COMPARISON:  Radiograph dated December 16, 2019 FINDINGS: NG tube tip is in the proximal stomach with side port in the distal esophagus, recommend advancement. Dilated gas-filled loops of small bowel, compatible with history of small-bowel obstruction. IMPRESSION: NG tube tip is in the proximal stomach with side port in the distal esophagus, recommend advancement. Electronically Signed   By: Yetta Glassman M.D.   On: 05/17/2021 09:04   DG Foot Complete Left  Result Date: 04/30/2021  Please see detailed radiograph report in office note.  VAS [place] ABI WITH/WO TBI  Result Date: 05/02/2021  LOWER EXTREMITY DOPPLER STUDY Patient Name:  HAMDI KLEY  Date of Exam:   05/02/2021 Medical Rec #: [id number]           Accession #:    [id number] Date of Birth: 02-Dec-1935          Patient Gender: M Patient Age:   75 years Exam Location:  [facility] Procedure:      VAS [place] ABI WITH/WO TBI Referring Phys: [employer] --------------------------------------------------------------------------------  Indications: Ulceration. High Risk Factors: Hypertension, hyperlipidemia, Diabetes, no history of                    smoking, prior MI, coronary artery disease.  Performing Technologist: Delorise Shiner RVT  Examination Guidelines: A complete evaluation includes at minimum, Doppler waveform signals and systolic blood pressure reading at the level of bilateral brachial, anterior tibial, and posterior tibial arteries, when vessel segments are accessible. Bilateral testing is considered an integral part of a complete examination. Photoelectric Plethysmograph (PPG)  waveforms and toe systolic pressure readings are included as required and additional duplex testing as needed. Limited examinations for reoccurring indications may be performed as noted.  ABI Findings: +---------+------------------+-----+---------+--------+  Right     Rt Pressure (mmHg) Index Waveform  Comment   +---------+------------------+-----+---------+--------+  Brachial  165                                          +---------+------------------+-----+---------+--------+  PTA       150                0.91  triphasic           +---------+------------------+-----+---------+--------+  DP        203                1.23  triphasic           +---------+------------------+-----+---------+--------+  Great Toe 84                 0.51                      +---------+------------------+-----+---------+--------+ +---------+------------------+-----+---------+-------+  Left      Lt Pressure (mmHg) Index Waveform  Comment  +---------+------------------+-----+---------+-------+  Brachial  164                                         +---------+------------------+-----+---------+-------+  PTA       129                0.78  biphasic           +---------+------------------+-----+---------+-------+  DP        184                1.12  triphasic          +---------+------------------+-----+---------+-------+  Great Toe 79                 0.48                     +---------+------------------+-----+---------+-------+ +-------+-----------+-----------+------------+------------+  ABI/TBI Today's ABI Today's TBI Previous ABI Previous TBI  +-------+-----------+-----------+------------+------------+  Right  1.23        0.51                                   +-------+-----------+-----------+------------+------------+  Left    1.12        0.48                                   +-------+-----------+-----------+------------+------------+  Summary: Right: Resting right ankle-brachial index is within normal range. No evidence of significant  right lower extremity arterial disease. The right toe-brachial index is abnormal. Left: Resting left ankle-brachial index is within normal range. No evidence of significant left lower extremity arterial disease. The left toe-brachial index is abnormal.  *See table(s) above for measurements and observations.  Electronically signed by Servando Snare MD on 05/02/2021 at 3:55:30 PM.    Final     Microbiology: Recent Results (from the past 240 hour(s))  Urine Culture     Status: Abnormal   Collection Time: 05/17/21  1:46 PM   Specimen: Urine, Clean Catch  Result Value Ref Range Status   Specimen Description URINE, CLEAN CATCH  Final   Special Requests   Final    NONE Performed at [facility], [address], [place], [place] [address]    Culture MULTIPLE SPECIES PRESENT, SUGGEST RECOLLECTION (A)  Final   Report Status 05/18/2021 FINAL  Final     Labs: Basic Metabolic Panel: Recent Labs  Lab 05/18/21 0641 05/19/21 0208 05/20/21 0057 05/21/21 0834 05/22/21 0203  NA 140 140 140 139 136  K 4.3 4.2 3.5 3.2* 3.9  CL 101 103 104 108 107  CO2 26 25 24  20* 19*  GLUCOSE 189* 153* 137* 129* 128*  BUN 19 20 21 17 13   CREATININE 1.13 1.22 1.20 1.18 1.11  CALCIUM 9.0 8.6* 8.3* 8.0* 8.0*  MG 2.2  --  2.0 1.9  --   PHOS 3.6  --   --   --   --    Liver Function Tests: Recent Labs  Lab 05/16/21 2159  AST 16  ALT 11  ALKPHOS 48  BILITOT 0.9  PROT 7.6  ALBUMIN 4.5   Recent Labs  Lab 05/16/21 2159  LIPASE 21   No results for input(s): AMMONIA in the last 168 hours. CBC: Recent Labs  Lab 05/18/21 0641 05/19/21 0208 05/20/21 0057 05/21/21 0834 05/22/21 0203  WBC 10.7* 8.7 7.1 6.1 5.6  HGB 12.7* 12.2* 11.5* 11.3* 10.7*  HCT 40.5 38.5* 37.7* 35.7* 35.0*  MCV 84.9 85.6 87.3 84.8 85.8  PLT 228 192 179 156 154   Cardiac Enzymes: No results for input(s): CKTOTAL, CKMB, CKMBINDEX, TROPONINI in the last 168 hours. BNP: BNP (last 3 results) Recent Labs    05/21/21 0834  BNP  405.6*    ProBNP (last 3 results) No results for input(s): PROBNP in the last 8760 hours.  CBG: Recent Labs  Lab 05/21/21 0632 05/21/21 1129 05/21/21 2022 05/22/21 0031 05/22/21 0625  GLUCAP 129* 148* 132* 134* 112*       Signed:  Kayleen Memos, MD [facility] 05/22/2021, 9:42 AM

## 2021-05-22 NOTE — Progress Notes (Addendum)
ANTICOAGULATION CONSULT NOTE  Pharmacy Consult: IV Heparin Indication: atrial fibrillation  Allergies  Allergen Reactions   Atorvastatin Other (See Comments)    Dizziness.   Jardiance [Empagliflozin] Other (See Comments)    Orthostasis   Sulfa Antibiotics    Ace Inhibitors Rash   Amoxicillin Rash   Meperidine Rash   Meperidine Hcl Rash   Naproxen Sodium Rash   Sulfamethoxazole Rash   Tetracycline Rash    Patient Measurements: Height: 5\' 11"  (180.3 cm) Weight: 117.3 kg (258 lb 9.6 oz) IBW/kg (Calculated) : 75.3 Heparin Dosing Weight: 100 kg  Vital Signs: BP: 128/75 (02/14 0324) Pulse Rate: 82 (02/14 0324)  Labs: Recent Labs    05/20/21 0057 05/21/21 0834 05/22/21 0203  HGB 11.5* 11.3* 10.7*  HCT 37.7* 35.7* 35.0*  PLT 179 156 154  HEPARINUNFRC 0.70 0.49 0.52  CREATININE 1.20 1.18 1.11   Estimated Creatinine Clearance: 65.7 mL/min (by C-G formula based on SCr of 1.11 mg/dL).  Assessment: 86 yr old man presented with abdominal pain and was found to have SBO, likely due to intra-abdominal adhesions in setting of multiple abdominal surgeries.  Pharmacy was consulted to transition patient from Pradaxa (last dose 2/8 AM) to IV heparin for history of Afib.    Heparin level therapeutic at 0.52 units/mL on 1900 units/hr. CBC stable. No s/sx of bleeding or infusion issues.  Goal of Therapy:  Heparin level 0.3-0.7 units/ml Monitor platelets by anticoagulation protocol: Yes   Plan:  Continue heparin gtt at 1900 units/hr Daily heparin level and CBC F/u with resuming Pradaxa when possible  ADDENDUM 9:30: dabigratran re-started. Stop Heparin   Benetta Spar, PharmD, BCPS, [facility] Clinical Pharmacist  Please check AMION for all [place] phone numbers After 10:00 PM, call [place] [phone]

## 2021-05-22 NOTE — Progress Notes (Signed)
Physical Therapy Treatment Patient Details Name: Shawn Blanchard MRN: [id number] DOB: 09/03/1935 Today's Date: 05/22/2021   History of Present Illness Pt is an 86 y.o. male admitted on 05/16/21 with c/o abdominal pain. Imaging (+) for SBO. 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   PT Comments    Pt progressing with mobility. Today's session focused on gait training and balance with RW. Pt preparing for d/c home today; wife present and supportive. Educ re: fall risk reduction, DME needs, activity recommendations; pt and wife report no further questions or concerns. If to remain admitted, will continue to follow acutely.    Recommendations for follow up therapy are one component of a multi-disciplinary discharge planning process, led by the attending physician.  Recommendations may be updated based on patient status, additional functional criteria and insurance authorization.  Follow Up Recommendations  Outpatient PT     Assistance Recommended at Discharge Intermittent Supervision/Assistance  Patient can return home with the following A little help with walking and/or transfers;A little help with bathing/dressing/bathroom;Assistance with cooking/housework;Assist for transportation   Equipment Recommendations  None recommended by PT    Recommendations for Other Services       Precautions / Restrictions Precautions Precautions: Fall;Other (comment) Precaution Comments: h/o orthostatic hypotension Restrictions Weight Bearing Restrictions: No     Mobility  Bed Mobility               General bed mobility comments: Received sitting in recliner    Transfers Overall transfer level: Needs assistance Equipment used: Rolling walker (2 wheels) Transfers: Sit to/from Stand Sit to Stand: Supervision           General transfer comment: reliant on momentum to power into standing, supervision for safety with  multiple sit<>stands from recliner to RW    Ambulation/Gait Ambulation/Gait assistance: Min guard, Min assist Gait Distance (Feet): 280 Feet Assistive device: Rolling walker (2 wheels) Gait Pattern/deviations: Step-through pattern, Decreased stride length, Trunk flexed Gait velocity: Decreased     General Gait Details: Initial unsteady ambulation with RW requiring 2x minA to prevent LOB; cues for safety and sequencing, stability improving with this and distance, min guard for balance   Stairs             Wheelchair Mobility    Modified Rankin (Stroke Patients Only)       Balance Overall balance assessment: Needs assistance Sitting-balance support: No upper extremity supported, Feet supported Sitting balance-Leahy Scale: Good       Standing balance-Leahy Scale: Fair Standing balance comment: can static stand and take steps without UE support, stability much improved with RW                            Cognition Arousal/Alertness: Awake/alert Behavior During Therapy: WFL for tasks assessed/performed Overall Cognitive Status: History of cognitive impairments - at baseline Area of Impairment: Safety/judgement, Memory                     Memory: Decreased short-term memory   Safety/Judgement: Decreased awareness of safety, Decreased awareness of deficits   Problem Solving: Requires verbal cues General Comments: wife reports baseline decreased safety awareness and "one track mind" - requires max cues for safety, pt leaving walker behind to answer phone despite stumbling and rushing to get it        Exercises      General Comments General comments (skin  integrity, edema, etc.): Pt's wife present and supportive; educ on fall risk reduction, DME needs, activity recommendations      Pertinent Vitals/Pain Pain Assessment Pain Assessment: Faces Faces Pain Scale: No hurt Pain Intervention(s): Monitored during session    Home Living                           Prior Function            PT Goals (current goals can now be found in the care plan section) Progress towards PT goals: Progressing toward goals    Frequency    Min 3X/week      PT Plan Current plan remains appropriate    Co-evaluation              AM-PAC PT "6 Clicks" Mobility   Outcome Measure  Help needed turning from your back to your side while in a flat bed without using bedrails?: None Help needed moving from lying on your back to sitting on the side of a flat bed without using bedrails?: A Little Help needed moving to and from a bed to a chair (including a wheelchair)?: A Little Help needed standing up from a chair using your arms (e.g., wheelchair or bedside chair)?: A Little Help needed to walk in hospital room?: A Little Help needed climbing 3-5 steps with a railing? : A Little 6 Click Score: 19    End of Session Equipment Utilized During Treatment: Gait belt Activity Tolerance: Patient tolerated treatment well Patient left: in chair;with call bell/phone within reach Nurse Communication: Mobility status PT Visit Diagnosis: Other abnormalities of gait and mobility (R26.89)     Time: 0939-1000 PT Time Calculation (min) (ACUTE ONLY): 21 min  Charges:  $Gait Training: 8-22 mins                     Mabeline Caras, PT, DPT Acute Rehabilitation Services  Pager [phone] Office [place] 05/22/2021, 12:03 PM

## 2021-05-22 NOTE — Progress Notes (Signed)
° ° °   °  Subjective: CC: Doing well. No abdominal pain, n/v. Does not feel bloated or distended. Tolerating soft diet and finished most of breakfast. Burping/belching has stopped. Continues to pass flatus. Had a more formed bm (semi-solid) at 5pm yesterday. Wants to go home. No PRN pain or antinausea medications used yesterday.   Objective: Vital signs in last 24 hours: Temp:  [97.9 F (36.6 C)-98.4 F (36.9 C)] 97.9 F (36.6 C) (02/13 1948) Pulse Rate:  [81-84] 82 (02/14 0324) Resp:  [16-17] 17 (02/14 0324) BP: (128-152)/(54-75) 128/75 (02/14 0324) SpO2:  [98 %-100 %] 98 % (02/14 0324) Weight:  [117.3 kg] 117.3 kg (02/14 0500) Last BM Date : 05/21/20  Intake/Output from previous day: 02/13 0701 - 02/14 0700 In: 120 [P.O.:120] Out: -  Intake/Output this shift: No intake/output data recorded.  PE: Gen:  Alert, NAD, pleasant Pulm: Normal rate and effort normal Abd: Protuberant abdomen that is soft, ND, NT, +BS. Psych: A&Ox3  Skin: no rashes noted, warm and dry  Lab Results:  Recent Labs    05/21/21 0834 05/22/21 0203  WBC 6.1 5.6  HGB 11.3* 10.7*  HCT 35.7* 35.0*  PLT 156 154   BMET Recent Labs    05/21/21 0834 05/22/21 0203  NA 139 136  K 3.2* 3.9  CL 108 107  CO2 20* 19*  GLUCOSE 129* 128*  BUN 17 13  CREATININE 1.18 1.11  CALCIUM 8.0* 8.0*   PT/INR No results for input(s): LABPROT, INR in the last 72 hours. CMP     Component Value Date/Time   NA 136 05/22/2021 0203   NA 141 06/03/2019 1543   K 3.9 05/22/2021 0203   CL 107 05/22/2021 0203   CO2 19 (L) 05/22/2021 0203   GLUCOSE 128 (H) 05/22/2021 0203   BUN 13 05/22/2021 0203   BUN 25 06/03/2019 1543   CREATININE 1.11 05/22/2021 0203   CALCIUM 8.0 (L) 05/22/2021 0203   PROT 7.6 05/16/2021 2159   ALBUMIN 4.5 05/16/2021 2159   AST 16 05/16/2021 2159   ALT 11 05/16/2021 2159   ALKPHOS 48 05/16/2021 2159   BILITOT 0.9 05/16/2021 2159   GFRNONAA >60 05/22/2021 0203   GFRAA >60 12/17/2019 0456    Lipase     Component Value Date/Time   LIPASE 21 05/16/2021 2159    Studies/Results: No results found.  Anti-infectives: Anti-infectives (From admission, onward)    Start     Dose/Rate Route Frequency Ordered Stop   05/17/21 1400  cefTRIAXone (ROCEPHIN) 2 g in sodium chloride 0.9 % 100 mL IVPB  Status:  Discontinued        2 g 200 mL/hr over 30 Minutes Intravenous Every 24 hours 05/17/21 1314 05/19/21 1346        Assessment/Plan SBO - Clinically resolved. Tolerating diet without abdominal pain, distension or n/v. Has ROBF. NT on exam. Okay for d/c from our standpoint. Discussed with TRH in person.    FEN: Soft diet  ID: None currently.  VTE: heparin gtt (afib). Okay to resume home meds   CHF CAD s/p PCI 2002 A.fib on pradaxa - hold pradaxa  DM HTN Obesity   This care required straightforward level of medical decision making.    LOS: 5 days    Jillyn Ledger , [facility] 05/22/2021, 8:12 AM Please see Amion for pager number during day hours 7:00am-4:30pm

## 2021-05-22 NOTE — Plan of Care (Signed)
Goals met

## 2021-05-23 ENCOUNTER — Other Ambulatory Visit: Payer: Medicare PPO

## 2021-05-23 ENCOUNTER — Telehealth: Payer: Self-pay | Admitting: Gastroenterology

## 2021-05-23 DIAGNOSIS — D509 Iron deficiency anemia, unspecified: Secondary | ICD-10-CM | POA: Diagnosis not present

## 2021-05-23 DIAGNOSIS — K529 Noninfective gastroenteritis and colitis, unspecified: Secondary | ICD-10-CM

## 2021-05-23 DIAGNOSIS — K56609 Unspecified intestinal obstruction, unspecified as to partial versus complete obstruction: Secondary | ICD-10-CM | POA: Diagnosis not present

## 2021-05-23 DIAGNOSIS — C8599 Non-Hodgkin lymphoma, unspecified, extranodal and solid organ sites: Secondary | ICD-10-CM | POA: Diagnosis not present

## 2021-05-23 NOTE — Telephone Encounter (Signed)
Returned call and spoke with wife. Wife stated that pt wanted Dr. Bryan Lemma to know that he just got out of the hospital for a SBO. Pt did not have surgery. Wife wanted to know if pt should still turn in the stool test that was ordered at office appt. Let wife know that I did not know a reason why pt couldn't proceed with stool testing but that I would let Dr. Bryan Lemma know in case there are any new recommendations. Pt's wife verbalized understanding.

## 2021-05-23 NOTE — Telephone Encounter (Signed)
Inbound call from patient states he was recently in hospital for 6 days for a bowel blockage. Would like a call back to discuss if he should do the stool sample

## 2021-05-24 ENCOUNTER — Encounter: Payer: Self-pay | Admitting: Family Medicine

## 2021-05-25 ENCOUNTER — Other Ambulatory Visit: Payer: Self-pay

## 2021-05-27 LAB — GI PROFILE, STOOL, PCR

## 2021-05-27 LAB — PANCREATIC ELASTASE, FECAL: Pancreatic Elastase, Fecal: 224 ug Elast./g (ref >200)

## 2021-05-28 ENCOUNTER — Encounter: Payer: Self-pay | Admitting: Family Medicine

## 2021-05-28 ENCOUNTER — Ambulatory Visit: Payer: Medicare PPO | Admitting: Family Medicine

## 2021-05-28 ENCOUNTER — Other Ambulatory Visit: Payer: Self-pay

## 2021-05-28 VITALS — BP 128/70 | HR 60 | Temp 97.0°F | Ht 71.0 in | Wt 256.6 lb

## 2021-05-28 DIAGNOSIS — K529 Noninfective gastroenteritis and colitis, unspecified: Secondary | ICD-10-CM | POA: Diagnosis not present

## 2021-05-28 DIAGNOSIS — K56609 Unspecified intestinal obstruction, unspecified as to partial versus complete obstruction: Secondary | ICD-10-CM | POA: Diagnosis not present

## 2021-05-28 LAB — BASIC METABOLIC PANEL
BUN: 17 mg/dL (ref 6–23)
CO2: 32 mEq/L (ref 19–32)
Calcium: 9.6 mg/dL (ref 8.4–10.5)
Chloride: 100 mEq/L (ref 96–112)
Creatinine, Ser: 1.3 mg/dL (ref 0.40–1.50)
GFR: 50.86 mL/min — ABNORMAL LOW (ref >60.00)
Glucose, Bld: 119 mg/dL — ABNORMAL HIGH (ref 70–99)
Potassium: 4.1 mEq/L (ref 3.5–5.1)
Sodium: 138 mEq/L (ref 135–145)

## 2021-05-28 LAB — CBC
HCT: 36 % — ABNORMAL LOW (ref 39.0–52.0)
Hemoglobin: 11.9 g/dL — ABNORMAL LOW (ref 13.0–17.0)
MCHC: 32.9 g/dL (ref 30.0–36.0)
MCV: 82.8 fl (ref 78.0–100.0)
Platelets: 228 10*3/uL (ref 150.0–400.0)
RBC: 4.35 Mil/uL (ref 4.22–5.81)
RDW: 17.5 % — ABNORMAL HIGH (ref 11.5–15.5)
WBC: 8 10*3/uL (ref 4.0–10.5)

## 2021-05-28 NOTE — Progress Notes (Signed)
[facility] [address] [place] [address] Dept: [phone] Dept Fax: [phone]  Office Visit  Subjective:    Patient ID: Shawn Blanchard, male    DOB: 1936/02/17, 86 y.o..   MRN: [id number]  Chief Complaint  Patient presents with   Hospitalization [facility] f/u,  still having some diarrhea off/on.  Stopped miralax.      History of Present Illness:  Patient is in today for hospital follow-up. Mr. Stonehocker was admitted at [facility] 2/8-2/14/2023 with a small bowel obstruciton. He notes this is the 6th time he has had such. 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 He has had a lysis of adhesions since then. He continues to have occasional episodes of obstruction. Mr. Almanza notes he is doing better from this currently. However, he has had a return of diarrhea. He is currently being worked up by Dr. Bryan Lemma in this regard. Previous lab and stool studies had been overall normal. Mr. Dy had been advised to take Miralax, but in light of his diarrhea, he has only had one dose since discharge.  Past Medical History: Patient Active Problem List   Diagnosis Date Noted   SBO (small bowel obstruction) ([place]) 05/17/2021   Hypertensive urgency 05/17/2021   UTI (urinary tract infection) 05/17/2021   Normocytic anemia 05/17/2021   History of lymphoma 05/17/2021   Iron deficiency anemia 04/27/2021   History of squamous cell carcinoma in situ 04/18/2021   Statin intolerance 03/09/2021   Peritoneal adhesion 11/08/2020   Peripheral venous insufficiency 11/08/2020   Type 2 diabetes mellitus with stage 3a chronic kidney disease, without long-term current use of insulin ([place]) 02/22/2020   Old MI (myocardial infarction)    Floppy eyelid syndrome of both eyes 10/05/2019   Sensorineural hearing loss (SNHL), bilateral 10/04/2019   Stage 3a chronic kidney disease ([place]) 06/13/2019    Carpal tunnel syndrome of right wrist 03/25/2019   Diabetic polyneuropathy associated with type 2 diabetes mellitus ([place]) 12/16/2018   Blepharitis of both upper and lower eyelid 10/02/2018   Meibomian gland dysfunction (MGD) of both eyes 10/02/2018   Essential hypertension 11/07/2017   Orthostatic dizziness 09/15/2017   Dermatochalasis of both upper eyelids 11/21/2016   Chronic anticoagulation 10/11/2016   Male erectile disorder 06/17/2016   Hyperlipidemia 04/11/2016   Small bowel obstruction ([place]) 04/11/2016   BMI 35.0-35.9,adult 04/11/2016   Leukocytosis 04/11/2016   Arthritis 03/12/2016   Pseudophakia of both eyes 03/12/2016   Insomnia 11/27/2015   Ganglion cyst 11/27/2015   Chronic diastolic heart failure ([place]) 09/01/2015   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[place]) 08/30/2015   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[place]) 08/30/2015   Obstructive sleep apnea 08/30/2015   Pulmonary hypertension ([place]) 08/30/2015   Restless leg syndrome 08/30/2015   Chronic atrial fibrillation ([place]) 06/12/2015   Gastroesophageal reflux disease without esophagitis 06/12/2015   Idiopathic chronic gout 05/17/2015   Coronary artery disease involving native coronary artery of native heart with angina pectoris ([place]) 02/15/2015   Hypertensive heart disease with heart failure ([place]) 02/15/2015   Type 2 diabetes mellitus with neurologic complication, without long-term current use of insulin ([place]) 12/02/2014   Past Surgical History:  Procedure Laterality Date   ABDOMINAL ADHESION SURGERY     x 2   BLEPHAROPLASTY Bilateral    CARPAL TUNNEL RELEASE Right 2021   CATARACT EXTRACTION Bilateral    CERVICAL SPINE SURGERY     CHOLECYSTECTOMY  CORONARY ANGIOPLASTY WITH STENT PLACEMENT  2000   NASAL SINUS SURGERY     ORIF TIBIAL SHAFT FRACTURE W/ PLATES AND SCREWS Left    PARTIAL KNEE ARTHROPLASTY Bilateral    SMALL INTESTINE SURGERY     Adenocarcinoma   TONSILLECTOMY     Family History  Problem Relation Age of  Onset   Cancer Mother        Ovarian   Diabetes Mother    Heart disease Mother    Heart disease Father    Stroke Father    Diabetes Father    Diabetes Sister    Diabetes Brother    Diabetes Brother    Cancer Paternal Grandfather        Prostate   Colon cancer Neg Hx    Rectal cancer Neg Hx    Pancreatic cancer Neg Hx    Liver cancer Neg Hx    Stomach cancer Neg Hx    Outpatient Medications Prior to Visit  Medication Sig Dispense Refill   Alpha-Lipoic Acid 100 MG CAPS Take 100 mg by mouth in the morning and at bedtime.     carvedilol (COREG) 3.125 MG tablet TAKE 1 TABLET BY MOUTH 2 TIMES A DAY WITH A MEAL (Patient taking differently: Take 3.125 mg by mouth 2 (two) times daily with a meal.) 180 tablet 3   Cyanocobalamin (VITAMIN B 12 PO) Take 1 tablet by mouth daily.     dabigatran (PRADAXA) 150 MG CAPS capsule TAKE 1 CAPSULE BY MOUTH 2 TIMES DAILY (Patient taking differently: Take 150 mg by mouth 2 (two) times daily.) 180 capsule 2   ezetimibe (ZETIA) 10 MG tablet Take 10 mg by mouth daily.     furosemide (LASIX) 20 MG tablet TAKE 1 TABLET BY MOUTH 2 TIMES A DAY (Patient taking differently: Take 20 mg by mouth 2 (two) times daily.) 180 tablet 3   glucose blood test strip OneTouch Ultra Blue Test Strip     hydrocortisone 2.5 % ointment Apply 1 application topically 2 (two) times daily as needed (flare ups).     Iron, Ferrous Sulfate, 325 (65 Fe) MG TABS Take 325 mg by mouth 2 (two) times daily. 60 tablet 2   JANUMET XR [phone] MG TB24 TAKE 1 TABLET BY MOUTH EVERY DAY WITH BREAKFAST (FOR DIABETES) (Patient taking differently: Take 1 tablet by mouth daily.) 90 tablet 0   Lancets (ONETOUCH DELICA PLUS ZESPQZ30Q) MISC OneTouch Delica Plus Lancet 33 gauge     melatonin 3 MG TABS tablet Take 3 mg by mouth at bedtime.     metFORMIN (GLUCOPHAGE-XR) 500 MG 24 hr tablet Take 1 tablet (500 mg total) by mouth daily in the afternoon. 90 tablet 3   Multiple Vitamin (MULTI-VITAMINS PO) Take 1  tablet by mouth daily.     nitroGLYCERIN (NITROSTAT) 0.4 MG SL tablet PLACE 1 TABLET UNDER TONGUE EVERY 5 MINUTES AS NEEDED FOR CHEST PAIN (UP TO 3 DOSES) (Patient taking differently: Place 0.4 mg under the tongue every 5 (five) minutes as needed for chest pain.) 25 tablet 11   Polyethyl Glycol-Propyl Glycol 0.4-0.3 % SOLN Place 1 drop into both eyes 2 (two) times daily as needed (dry eyes).     polyvinyl alcohol (LIQUIFILM TEARS) 1.4 % ophthalmic solution Place 1 drop into both eyes as needed for dry eyes.     REPATHA SURECLICK 762 MG/ML SOAJ Inject 140 mg into the skin every 14 (fourteen) days. 2 mL 6   Turmeric 400 MG CAPS Take 400 mg  by mouth daily.     polyethylene glycol (MIRALAX) 17 g packet Take 17 g by mouth daily as needed. (Patient not taking: Reported on 05/28/2021) 14 each 0   No facility-administered medications prior to visit.   Allergies  Allergen Reactions   Atorvastatin Other (See Comments)    Dizziness.   Jardiance [Empagliflozin] Other (See Comments)    Orthostasis   Sulfa Antibiotics    Ace Inhibitors Rash   Amoxicillin Rash   Meperidine Rash   Meperidine Hcl Rash   Naproxen Sodium Rash   Sulfamethoxazole Rash   Tetracycline Rash   Objective:   Today's Vitals   05/28/21 1121  BP: 128/70  Pulse: 60  Temp: (!) 97 F (36.1 C)  TempSrc: Temporal  SpO2: 98%  Weight: 256 lb 9.6 oz (116.4 kg)  Height: 5\' 11"  (1.803 m)   Body mass index is 35.79 kg/m.   General: Well developed, well nourished. No acute distress. Psych: Alert and oriented x3. Normal mood and affect.  Health Maintenance Due  Topic Date Due   TETANUS/TDAP  12/08/2018     Assessment & Plan:   1. SBO (small bowel obstruction) (HCC) Improved. I will check labs to make sure issues found during his hospitalization have resolved.  - CBC - Basic metabolic panel  2. Chronic diarrhea of unknown origin Following with Dr. Bryan Lemma. I agreed with him avoiding Miralax as long as he is having  diarrhea. He could try a dose of Imodium if needed. Otherwise, he should try to increase his daily fiber, moderate meat and cheese intake, and chew his food adequately.  Haydee Salter, MD

## 2021-05-29 DIAGNOSIS — N1831 Chronic kidney disease, stage 3a: Secondary | ICD-10-CM | POA: Diagnosis not present

## 2021-05-31 ENCOUNTER — Telehealth: Payer: Self-pay

## 2021-05-31 ENCOUNTER — Other Ambulatory Visit: Payer: Self-pay

## 2021-05-31 ENCOUNTER — Telehealth: Payer: Self-pay | Admitting: Family Medicine

## 2021-05-31 ENCOUNTER — Ambulatory Visit: Payer: Medicare PPO

## 2021-05-31 DIAGNOSIS — L84 Corns and callosities: Secondary | ICD-10-CM

## 2021-05-31 DIAGNOSIS — E1142 Type 2 diabetes mellitus with diabetic polyneuropathy: Secondary | ICD-10-CM

## 2021-05-31 NOTE — Telephone Encounter (Signed)
Pt's wife(Rebecca) came in and dropped off a form for Dr. [name] to fill out. She is wanting [place] to fax it to the # given [phone]. I have placed form upfront.

## 2021-05-31 NOTE — Telephone Encounter (Signed)
Casts sent to [employer] - HOLD FOR CMN

## 2021-05-31 NOTE — Progress Notes (Signed)
SITUATION Reason for Consult: Evaluation for Prefabricated Diabetic Shoes and Custom Diabetic Inserts. Patient / Caregiver Report: Patient would like well fitting shoes  OBJECTIVE DATA: Patient History / Diagnosis:    ICD-10-CM   1. Diabetic polyneuropathy associated with type 2 diabetes mellitus (HCC)  E11.42     2. Pre-ulcerative calluses  L84       Current or Previous Devices:   None and no history  In-Person Foot Examination: Ulcers & Callousing:   Preulcerative callus under left and right 1st met head  Deformities:   - None   Shoe Size: 11.5W  ORTHOTIC RECOMMENDATION Recommended Devices: - 1x pair prefabricated PDAC approved diabetic shoes; Patient Selected - Apex A6000M Size 11.5W - 3x pair custom-to-patient PDAC approved vacuum formed diabetic insoles.  GOALS OF SHOES AND INSOLES - Reduce shear and pressure - Reduce / Prevent callus formation - Reduce / Prevent ulceration - Protect the fragile healing compromised diabetic foot.  Patient would benefit from diabetic shoes and inserts as patient has diabetes mellitus and the patient has one or more of the following conditions: - History of partial or complete amputation of the foot - History of previous foot ulceration. - History of pre-ulcerative callus - Peripheral neuropathy with evidence of callus formation - Foot deformity - Poor circulation  ACTIONS PERFORMED Patient was casted for insoles via crush box and measured for shoes via brannock device. Procedure was explained and patient tolerated procedure well. All questions were answered and concerns addressed.  PLAN Patient is to be contacted and scheduled for fitting once CMN is obtained from treaing physician and shoes and insoles have been fabricated and received.

## 2021-05-31 NOTE — Telephone Encounter (Signed)
Will place on providers desk to be filled out and signed.  Dm/cma

## 2021-06-01 ENCOUNTER — Other Ambulatory Visit: Payer: Self-pay

## 2021-06-01 ENCOUNTER — Telehealth: Payer: Self-pay

## 2021-06-01 ENCOUNTER — Telehealth: Payer: Self-pay | Admitting: Gastroenterology

## 2021-06-01 ENCOUNTER — Telehealth: Payer: Self-pay | Admitting: General Surgery

## 2021-06-01 DIAGNOSIS — K529 Noninfective gastroenteritis and colitis, unspecified: Secondary | ICD-10-CM

## 2021-06-01 DIAGNOSIS — D509 Iron deficiency anemia, unspecified: Secondary | ICD-10-CM

## 2021-06-01 DIAGNOSIS — N1831 Chronic kidney disease, stage 3a: Secondary | ICD-10-CM | POA: Diagnosis not present

## 2021-06-01 DIAGNOSIS — E1122 Type 2 diabetes mellitus with diabetic chronic kidney disease: Secondary | ICD-10-CM | POA: Diagnosis not present

## 2021-06-01 DIAGNOSIS — E1121 Type 2 diabetes mellitus with diabetic nephropathy: Secondary | ICD-10-CM | POA: Diagnosis not present

## 2021-06-01 DIAGNOSIS — N183 Chronic kidney disease, stage 3 unspecified: Secondary | ICD-10-CM | POA: Diagnosis not present

## 2021-06-01 DIAGNOSIS — E669 Obesity, unspecified: Secondary | ICD-10-CM | POA: Diagnosis not present

## 2021-06-01 DIAGNOSIS — I129 Hypertensive chronic kidney disease with stage 1 through stage 4 chronic kidney disease, or unspecified chronic kidney disease: Secondary | ICD-10-CM | POA: Diagnosis not present

## 2021-06-01 MED ORDER — PLENVU 140 G PO SOLR: 1.0000 | Freq: Once | ORAL | 0 refills | Status: AC

## 2021-06-01 NOTE — Telephone Encounter (Signed)
Done by Mickel Baas, see note from 06/01/2021

## 2021-06-01 NOTE — Telephone Encounter (Signed)
[facility] Pre-operative Risk Assessment     Request for surgical clearance:     Endoscopy Procedure  What type of surgery is being performed?     EGD/colon  When is this surgery scheduled?     06/13/21  What type of clearance is required ?   Pharmacy  Are there any medications that need to be held prior to surgery and how long? Pradaxa x2 days  Practice name and name of physician performing surgery?      [facility]  What is your office phone and fax number?      Phone- [phone]  [phone]  Anesthesia type (None, local, MAC, general) ?       MAC

## 2021-06-01 NOTE — Telephone Encounter (Signed)
Spoke with [place], pt's daughter, and let her know that we could do patient's procedure on 06/13/21 at 10:30 am but pt would need to be there at 9 am. Pt's daughter checked with pt and then called back and stated that day would work. Let daughter know that we contacted the pt's cardiologist to make sure it was ok to hold pradaxa for 2 days before procedure and that we would contact pt with exact dates for him to hold blood thinner once we get that ok from cardiologist. Plenvu prep sent to [facility]. Ambulatory referral to GI placed. Instructions sent to pt's mychart and mailed. Pt's daughter verbalized understanding and had no other concerns at end of call.

## 2021-06-01 NOTE — Telephone Encounter (Signed)
° °  Name: Shawn Blanchard  DOB: [birth year]  MRN: [id number]   Primary Cardiologist: Shirlee More, MD  Chart reviewed as part of pre-operative protocol coverage. Patient was contacted 06/01/2021 in reference to pre-operative risk assessment for pending surgery as outlined below.  Shawn Blanchard was last seen on 01/01/2021 by Dr. [name].  Since that day, Shawn Blanchard has done well from a cardiac standpoint.  He is active and goes to the gym several days a week, he reports METS >4.   Therefore, based on ACC/AHA guidelines, the patient would be at acceptable risk for the planned procedure without further cardiovascular testing.   The patient was advised that if he develops new symptoms prior to surgery to contact our office to arrange for a follow-up visit, and he verbalized understanding.  Patient with diagnosis of A fib on Pradaxa for anticoagulation.     Procedure: EGD colon Date of procedure: 06/13/21     CHA2DS2-VASc Score = 6  This indicates a 9.7% annual risk of stroke. The patient's score is based upon: CHF History: 1 HTN History: 1 Diabetes History: 1 Stroke History: 0 Vascular Disease History: 1 Age Score: 2 Gender Score: 0     CrCl 63 mL/min Platelet count 233K     Per office protocol, patient can hold Pradaxa for 1.5 days prior to procedure.   I will route this recommendation to the requesting party via Epic fax function and remove from pre-op pool. Please call with questions.  Lenna Sciara, NP 06/01/2021, 3:53 PM

## 2021-06-01 NOTE — Telephone Encounter (Signed)
-----   Message from Letta Pate, [place] sent at 05/18/2021  3:10 PM EST ----- Regarding: Hospital case [place] hold x 7 days, [facility]-EGD colon- Pradaxa hold 2 day Dr [name]

## 2021-06-01 NOTE — Telephone Encounter (Signed)
Patient with diagnosis of A fib on Pradaxa for anticoagulation.    Procedure: EGD colon Date of procedure: 06/13/21   CHA2DS2-VASc Score = 6  This indicates a 9.7% annual risk of stroke. The patient's score is based upon: CHF History: 1 HTN History: 1 Diabetes History: 1 Stroke History: 0 Vascular Disease History: 1 Age Score: 2 Gender Score: 0    CrCl 63 mL/min Platelet count 233K   Per office protocol, patient can hold Pradaxa for 1.5 days prior to procedure.

## 2021-06-01 NOTE — Telephone Encounter (Signed)
Inbound call from patient daughter states patient submitted stool sample and have not heard anything and would like a call back

## 2021-06-01 NOTE — Telephone Encounter (Signed)
Spoke with pt's daughter. Daughter wanted to know what was next after stool test. Let daughter know that order for CT enterography was placed on 2/7 and gave daughter number to schedule CT when it would fit their schedule. Daughter also wanted to know when colonoscopy and Egd would be scheduled. Let daughter know we would call back to schedule these procedures. Pt's daughter verbalized understanding.

## 2021-06-01 NOTE — Telephone Encounter (Signed)
Form filled out and faxed to [facility] at [phone] attn: Aaron Edelman Little.   Patient's wife notified VIA phone.  Dm/cma

## 2021-06-04 ENCOUNTER — Encounter: Payer: Self-pay | Admitting: Gastroenterology

## 2021-06-04 ENCOUNTER — Other Ambulatory Visit: Payer: Self-pay

## 2021-06-04 ENCOUNTER — Telehealth: Payer: Self-pay

## 2021-06-04 DIAGNOSIS — R2681 Unsteadiness on feet: Secondary | ICD-10-CM | POA: Diagnosis not present

## 2021-06-04 MED ORDER — CLENPIQ 10-3.5-12 MG-GM -GM/160ML PO SOLN: 1.0000 | Freq: Once | ORAL | 0 refills | Status: AC

## 2021-06-04 NOTE — Telephone Encounter (Addendum)
Spoke with pt's wife who said that they had difficulty picking up prep. Pharmacy stated that prep that was sent required PA. Sent a different prep to pt's pharmacy. Updated prep instructions sent to pt's mychart and mailed. Left voicemail for pt to call back.

## 2021-06-04 NOTE — Telephone Encounter (Signed)
Spoke with pt's wife and let her know that a new prep was sent to pt's pharmacy and instructions were sent to pt's mychart and mailed. Told wife to contact us if she had any questions or any difficulty getting the Clenpiq. Pt's wife verbalized understanding and had no other concerns at end of call.

## 2021-06-04 NOTE — Telephone Encounter (Signed)
Called pt and spoke with pt's wife. Let pt's wife know recommendations for pradaxa and sent instructions to pt's mychart. Pt's wife verbalized understanding.

## 2021-06-05 ENCOUNTER — Encounter (HOSPITAL_COMMUNITY): Payer: Self-pay | Admitting: Gastroenterology

## 2021-06-07 ENCOUNTER — Other Ambulatory Visit: Payer: Self-pay

## 2021-06-07 ENCOUNTER — Ambulatory Visit (INDEPENDENT_AMBULATORY_CARE_PROVIDER_SITE_OTHER): Payer: Medicare PPO | Admitting: Podiatry

## 2021-06-07 ENCOUNTER — Encounter: Payer: Self-pay | Admitting: Podiatry

## 2021-06-07 ENCOUNTER — Telehealth: Payer: Self-pay | Admitting: *Deleted

## 2021-06-07 DIAGNOSIS — L97522 Non-pressure chronic ulcer of other part of left foot with fat layer exposed: Secondary | ICD-10-CM | POA: Diagnosis not present

## 2021-06-07 DIAGNOSIS — E1142 Type 2 diabetes mellitus with diabetic polyneuropathy: Secondary | ICD-10-CM

## 2021-06-07 DIAGNOSIS — E11621 Type 2 diabetes mellitus with foot ulcer: Secondary | ICD-10-CM | POA: Diagnosis not present

## 2021-06-07 NOTE — Telephone Encounter (Signed)
Called patient and spoke with wife giving recommendations per Dr Blenda Mounts, and if it continues to bleed to go to Urgent or ER, verbalized understanding and said that he is on a blood thinner(Pradaxa). ?

## 2021-06-07 NOTE — Telephone Encounter (Signed)
They can apply another layer of bandages over top what was placed today. Keep it elevated and stay off the foot as much as possible as well.

## 2021-06-07 NOTE — Telephone Encounter (Signed)
Patient's wife is calling because husband is having excessive bleeding thru bandages after applying compression, after leaving office today. Please advise. ?

## 2021-06-07 NOTE — Progress Notes (Signed)
?Subjective:  ?Patient ID: Shawn Blanchard, male    DOB: 1935/11/25,   MRN: [id number] ? ?Chief Complaint  ?Patient presents with  ? Wound Check  ?  Left foot patient sates he has neuropathy and can not feel anything but can feel sharp shooting pain coming from wound /pressure sore ?  ? ? ?86 y.o. male presents for  follow-up of previous blister and wound. Relates he has been bleeding at the site and was concerned that it had opened back up. Relates he has been doing a lot of exercising lately waiting to start PT for his heart.  He has been fitted for new diabetic shoes. He is diabetic and his last A1c was 7.5. Denies any other pedal complaints. Denies n/v/f/c.  ? ?PCP: Arlester Marker MD  ? ?Past Medical History:  ?Diagnosis Date  ? Acute on chronic systolic congestive heart failure ([place]) 11/07/2017  ? 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 Arthritis 03/12/2016  ? Bilateral hand pain 03/25/2019  ? Blepharitis of both upper and lower eyelid 10/02/2018  ? Carpal tunnel syndrome of right wrist 03/25/2019  ? Chronic anticoagulation 10/11/2016  ? Chronic atrial fibrillation ([place]) 06/12/2015  ? Overview:  Managed CARDS  ? Chronic diastolic heart failure ([place]) 09/01/2015  ? Coronary artery disease involving native coronary artery of native heart with angina pectoris ([place]) 02/15/2015  ? Overview:  Hx of MI with stent to LAD 2002 MPS 2016 with EF 47% and no ischemia  ? Dermatochalasis of both upper eyelids 11/21/2016  ? Dermatochalasis of right upper eyelid 10/02/2018  ? Diabetic polyneuropathy associated with type 2 diabetes mellitus ([place]) 12/16/2018  ? 2020  ? Dyslipidemia 11/07/2017  ? Essential hypertension 11/07/2017  ? Ganglion cyst 11/27/2015  ? Overview:  2017: dorsum right hand  ? Gastroesophageal reflux disease without esophagitis 06/12/2015  ? Hyperlipidemia 04/11/2016  ? Hypertensive heart disease with heart failure ([place]) 02/15/2015  ? Idiopathic chronic gout 05/17/2015  ? Insomnia 11/27/2015  ? Leukocytosis 04/11/2016  ?  Lightheadedness 10/11/2016  ? Long term current use of oral hypoglycemic drug 10/02/2018  ? 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 Male erectile disorder 06/17/2016  ? Meibomian gland dysfunction (MGD) of both eyes 10/02/2018  ? MI (myocardial infarction) ([place]) 02/15/2015  ? Overview:  2002  ? Obesity 04/11/2016  ? 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 exacerbation ([place]) 06/12/2015  ? Obstructive sleep apnea 08/30/2015  ? Overview:  CPAP  ? Old MI (myocardial infarction)   ? Orthostatic dizziness 09/15/2017  ? 2018: med related  ? Permanent atrial fibrillation ([place]) 06/12/2015  ? Overview:  Managed CARDS  ? Pseudophakia of both eyes 03/12/2016  ? Pulmonary hypertension ([place]) 08/30/2015  ? Overview:  Managed CARDS  ? Purulent inflammation of skin 12/29/2018  ? Restless leg syndrome 08/30/2015  ? Restrictive lung disease 12/26/2016  ? Overview:  2018: mod on spirometry  ? SBO (small bowel obstruction) ([place]) 12/14/2019  ? Senile nuclear sclerosis 05/02/2016  ? Small bowel obstruction ([place]) 04/11/2016  ? SOB (shortness of breath) 08/14/2015  ? Type 2 diabetes mellitus with neurologic complication, without long-term current use of insulin ([place]) 12/02/2014  ? Unilateral inguinal hernia without obstruction or gangrene 06/12/2015  ? ? ?Objective:  ?Physical Exam: ?Vascular: DP/PT pulses 2/4 bilateral. CFT <3 seconds. Normal hair growth on digits. No edema.  ?Skin. No lacerations or abrasions bilateral feet.  Blister noted sub left first metatarsal. Upon debridement red purulent material was noted. Wound measuring 2 cm x1 cm  x 0.3 cm was noted underlying the area with granular base and hyperkeratotic rim. No surrounding erythema or edema noted.  ?Musculoskeletal: MMT 5/5 bilateral lower extremities in DF, PF, Inversion and Eversion. Deceased ROM in DF of ankle joint.  ?Neurological: Sensation intact to light touch.  ? ?Assessment:  ? ?1. Diabetic ulcer of toe of left foot associated with type 2 diabetes mellitus, with fat layer exposed ([place])    ?2. Diabetic polyneuropathy associated with type 2 diabetes mellitus ([place])   ? ? ? ? ? ?Plan:  ?Patient was evaluated and treated and all questions answered. ?Ulcer right foot ulcer with fat layer exposed  ?-Dressed with Betadine and DSD  ?-Offloading with surgical shoe.  ?-No abx necessary.  ?-Awaiting DM shoes.  ?-Discussed staying off this foot as much as possible and sticking to upper body exercises.  ?-Referral to vascular for abnormal TBIs.  ?-Discussed glucose control and proper protein-rich diet.  ?-Discussed if any worsening redness, pain, fever or chills to call or may need to report to the emergency room. Patient expressed understanding.  ? ?Procedure: Excisional Debridement of Wound ?Rationale: Removal of non-viable soft tissue from the wound to promote healing.  ?Anesthesia: none ?Pre-Debridement Wound Measurements:Overlying callus ?Post-Debridement Wound Measurements: 2 cm x 1 cm x 0.3 cm  ?Type of Debridement: Sharp Excisional ?Tissue Removed: Non-viable soft tissue ?Depth of Debridement: subcutaneous tissue. ?Technique: Sharp excisional debridement to bleeding, viable wound base.  ?Dressing: Dry, sterile, compression dressing. ?Disposition: Patient tolerated procedure well. Patient to return in 2 week for follow-up. ? ?Return in about 2 weeks (around 06/21/2021) for wound check. ? ? ?Return in about 2 weeks (around 06/21/2021) for wound check. ? ? ?Lorenda Peck, DPM  ? ? ?

## 2021-06-08 ENCOUNTER — Ambulatory Visit: Payer: Medicare PPO | Admitting: Family Medicine

## 2021-06-13 ENCOUNTER — Telehealth: Payer: Self-pay | Admitting: Gastroenterology

## 2021-06-13 ENCOUNTER — Ambulatory Visit (HOSPITAL_COMMUNITY): Admission: RE | Admit: 2021-06-13 | Payer: Medicare PPO | Source: Home / Self Care | Admitting: Gastroenterology

## 2021-06-13 ENCOUNTER — Ambulatory Visit: Payer: Medicare PPO | Admitting: Podiatry

## 2021-06-13 SURGERY — ESOPHAGOGASTRODUODENOSCOPY (EGD) WITH PROPOFOL
Anesthesia: Monitor Anesthesia Care

## 2021-06-13 NOTE — Telephone Encounter (Signed)
Cirigliano, Vito V, DO  You 47 minutes ago (10:38 AM)  ? ?We can plan to discuss those options at OV follow-up.  ? ?

## 2021-06-13 NOTE — Telephone Encounter (Signed)
Scheduled appointment for pt with pt's wife. Pt is scheduled for office follow up on 06/29/21 at 9:30 am. Pt's wife verbalized understanding and had no other concerns.  ?

## 2021-06-13 NOTE — Telephone Encounter (Signed)
Patients wife called and stated that patient was scheduled for a colonoscopy at [facility] today and that the procedure had been canceled due to patient falling yesterday and having to be taken to the hospital. Wife stated that whoever she spoke with stated that he would have to have another OV. Seeking advice in what steps they need to take to get this procedure rescheduled. Please advise.    ?

## 2021-06-13 NOTE — Telephone Encounter (Signed)
Spoke with wife. Wife states that pt had started clear liquids yesterday morning for his EGD/colon and fell yesterday afternoon. Wife called EMS and pt's blood sugar and all vital signs were normal. They decided not to transport. Pt continued clear liquids and then fell again at 5pm. At that time wife contacted neighbor who is in healthcare and pt decided to stop clear liquids and eat. Hospital procedure was canceled. Wife reported that pt fell both times by slumping down and did not hit head. Wife reported that pt seems to be doing much better now that he is eating. Wife wanted to know if prep could be done in the hospital next time since the pt couldn't do prep at home. Please advise.  ?

## 2021-06-15 ENCOUNTER — Ambulatory Visit (HOSPITAL_COMMUNITY)
Admission: RE | Admit: 2021-06-15 | Discharge: 2021-06-15 | Disposition: A | Discharge status: Home / Self Care | Payer: Medicare PPO | Source: Ambulatory Visit | Attending: Gastroenterology | Admitting: Gastroenterology

## 2021-06-15 ENCOUNTER — Other Ambulatory Visit: Payer: Self-pay

## 2021-06-15 DIAGNOSIS — K56609 Unspecified intestinal obstruction, unspecified as to partial versus complete obstruction: Secondary | ICD-10-CM | POA: Insufficient documentation

## 2021-06-15 DIAGNOSIS — N281 Cyst of kidney, acquired: Secondary | ICD-10-CM | POA: Diagnosis not present

## 2021-06-15 DIAGNOSIS — C8599 Non-Hodgkin lymphoma, unspecified, extranodal and solid organ sites: Secondary | ICD-10-CM | POA: Diagnosis not present

## 2021-06-15 DIAGNOSIS — K5730 Diverticulosis of large intestine without perforation or abscess without bleeding: Secondary | ICD-10-CM | POA: Diagnosis not present

## 2021-06-15 DIAGNOSIS — K529 Noninfective gastroenteritis and colitis, unspecified: Secondary | ICD-10-CM | POA: Insufficient documentation

## 2021-06-15 MED ORDER — IOHEXOL 350 MG/ML SOLN: 100.0000 mL | Freq: Once | INTRAVENOUS | Status: DC | PRN

## 2021-06-15 MED ORDER — IOHEXOL 300 MG/ML  SOLN
100.0000 mL | Freq: Once | INTRAMUSCULAR | Status: AC | PRN
  Administered 2021-06-15: 100 mL via INTRAVENOUS

## 2021-06-15 MED ORDER — SODIUM CHLORIDE (PF) 0.9 % IJ SOLN
INTRAMUSCULAR | Status: AC
  Filled 2021-06-15: qty 50

## 2021-06-15 MED ORDER — BARIUM SULFATE 0.1 % PO SUSP
ORAL | Status: AC
  Filled 2021-06-15: qty 3

## 2021-06-18 ENCOUNTER — Telehealth: Payer: Self-pay

## 2021-06-18 NOTE — Telephone Encounter (Signed)
Lvm on home number that was left on vm. Called to update pt on diabetic shoes. As of today the forms have not been sent back in to [employer] by Dr Annie Main [name]'s office.

## 2021-06-19 NOTE — Telephone Encounter (Signed)
Due to issues I have noticed with faxing of forms to the vendor, could you please send them back to me at [phone] and I will make submit it on our end. Thank you all for your help.

## 2021-06-19 NOTE — Telephone Encounter (Signed)
Pt is checking on status of this message. Please advise pt at [phone]. ?

## 2021-06-20 NOTE — Telephone Encounter (Signed)
Pt called requesting to speak with Langley Gauss.  ?

## 2021-06-20 NOTE — Telephone Encounter (Signed)
Lft VM to rtn call. Dm/cma  

## 2021-06-20 NOTE — Telephone Encounter (Signed)
Shawn Blanchard with [employer] is calling about faxing documentation over for shoes. She is having an issue with the fax going through and wants to be sure it was sent.  ? ?Please call back. ?

## 2021-06-21 ENCOUNTER — Encounter: Payer: Self-pay | Admitting: Podiatry

## 2021-06-21 ENCOUNTER — Ambulatory Visit: Payer: Medicare PPO | Admitting: Podiatry

## 2021-06-21 ENCOUNTER — Other Ambulatory Visit: Payer: Self-pay

## 2021-06-21 DIAGNOSIS — M79674 Pain in right toe(s): Secondary | ICD-10-CM | POA: Diagnosis not present

## 2021-06-21 DIAGNOSIS — B351 Tinea unguium: Secondary | ICD-10-CM | POA: Diagnosis not present

## 2021-06-21 DIAGNOSIS — E1142 Type 2 diabetes mellitus with diabetic polyneuropathy: Secondary | ICD-10-CM | POA: Diagnosis not present

## 2021-06-21 DIAGNOSIS — L84 Corns and callosities: Secondary | ICD-10-CM | POA: Diagnosis not present

## 2021-06-21 DIAGNOSIS — M79675 Pain in left toe(s): Secondary | ICD-10-CM | POA: Diagnosis not present

## 2021-06-21 NOTE — Telephone Encounter (Signed)
Spoke to patient advised that form was faxed to [facility] a couple weeks ago due to the fax number for [employer] wouldn't go through and then placed in scan folder up front.  But hasn't been scanned into the chart yet.  ?He is at [facility] today and will ask them about the form.  If they don't have it printed out the unsigned form from chart and will have provider fill out in the morning at his upcoming appointment.  Dm/cma ? ?

## 2021-06-21 NOTE — Progress Notes (Signed)
?Subjective:  ?Patient ID: Shawn Blanchard, male    DOB: Apr 12, 1935,   MRN: [id number] ? ?No chief complaint on file. ? ? ?86 y.o. male presents for  follow-up of previous blister and wound. Relates it is doing better and has not been draining.  He has been fitted for new diabetic shoes and awaiting paperwork to be sent off. Relates nails are thickened elongated and painful. Requesting nail trim today. He is diabetic and his last A1c was 7.5. Denies any other pedal complaints. Denies n/v/f/c.  ? ?PCP: Arlester Marker MD  ? ?Past Medical History:  ?Diagnosis Date  ? Acute on chronic systolic congestive heart failure ([place]) 11/07/2017  ? 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 Arthritis 03/12/2016  ? Bilateral hand pain 03/25/2019  ? Blepharitis of both upper and lower eyelid 10/02/2018  ? Carpal tunnel syndrome of right wrist 03/25/2019  ? Chronic anticoagulation 10/11/2016  ? Chronic atrial fibrillation ([place]) 06/12/2015  ? Overview:  Managed CARDS  ? Chronic diastolic heart failure ([place]) 09/01/2015  ? Coronary artery disease involving native coronary artery of native heart with angina pectoris ([place]) 02/15/2015  ? Overview:  Hx of MI with stent to LAD 2002 MPS 2016 with EF 47% and no ischemia  ? Dermatochalasis of both upper eyelids 11/21/2016  ? Dermatochalasis of right upper eyelid 10/02/2018  ? Diabetic polyneuropathy associated with type 2 diabetes mellitus ([place]) 12/16/2018  ? 2020  ? Dyslipidemia 11/07/2017  ? Essential hypertension 11/07/2017  ? Ganglion cyst 11/27/2015  ? Overview:  2017: dorsum right hand  ? Gastroesophageal reflux disease without esophagitis 06/12/2015  ? Hyperlipidemia 04/11/2016  ? Hypertensive heart disease with heart failure ([place]) 02/15/2015  ? Idiopathic chronic gout 05/17/2015  ? Insomnia 11/27/2015  ? Leukocytosis 04/11/2016  ? Lightheadedness 10/11/2016  ? Long term current use of oral hypoglycemic drug 10/02/2018  ? 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 Male erectile disorder 06/17/2016  ?  Meibomian gland dysfunction (MGD) of both eyes 10/02/2018  ? MI (myocardial infarction) ([place]) 02/15/2015  ? Overview:  2002  ? Obesity 04/11/2016  ? 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 exacerbation ([place]) 06/12/2015  ? Obstructive sleep apnea 08/30/2015  ? Overview:  CPAP  ? Old MI (myocardial infarction)   ? Orthostatic dizziness 09/15/2017  ? 2018: med related  ? Permanent atrial fibrillation ([place]) 06/12/2015  ? Overview:  Managed CARDS  ? Pseudophakia of both eyes 03/12/2016  ? Pulmonary hypertension ([place]) 08/30/2015  ? Overview:  Managed CARDS  ? Purulent inflammation of skin 12/29/2018  ? Restless leg syndrome 08/30/2015  ? Restrictive lung disease 12/26/2016  ? Overview:  2018: mod on spirometry  ? SBO (small bowel obstruction) ([place]) 12/14/2019  ? Senile nuclear sclerosis 05/02/2016  ? Small bowel obstruction ([place]) 04/11/2016  ? SOB (shortness of breath) 08/14/2015  ? Type 2 diabetes mellitus with neurologic complication, without long-term current use of insulin ([place]) 12/02/2014  ? Unilateral inguinal hernia without obstruction or gangrene 06/12/2015  ? ? ?Objective:  ?Physical Exam: ?Vascular: DP/PT pulses 2/4 bilateral. CFT <3 seconds. Normal hair growth on digits. No edema.  ?Skin. Plantar first metatarsal wound healed. Nails 1-5 b/l are thickened elongated and with subungual debris.  ?Musculoskeletal: MMT 5/5 bilateral lower extremities in DF, PF, Inversion and Eversion. Deceased ROM in DF of ankle joint.  ?Neurological: Sensation intact to light touch.  ? ?Assessment:  ? ?1. Diabetic ulcer of toe of left foot associated with type 2 diabetes mellitus, with fat layer exposed (  [place])   ?2. Diabetic polyneuropathy associated with type 2 diabetes mellitus ([place])   ? ? ? ? ? ? ?Plan:  ?Patient was evaluated and treated and all questions answered. ?Ulcer left foot -healed  ?-May go back into regular shoes. We are still waiting on diabetic shoes.  Discussed keeping with low impact activity.  ?-No abx necessary.  ?-Discussed glucose  control and proper protein-rich diet.  ?-Discussed if any worsening redness, pain, fever or chills to call or may need to report to the emergency room. Patient expressed understanding.  ?-Discussed and educated patient on diabetic foot care, especially with  ?regards to the vascular, neurological and musculoskeletal systems.  ?-Stressed the importance of good glycemic control and the detriment of not  ?controlling glucose levels in relation to the foot. ?-Discussed supportive shoes at all times and checking feet regularly.  ?-Mechanically debrided all nails 1-5 bilateral using sterile nail nipper and filed with dremel without incident  ?-Answered all patient questions ?-Patient to return  in 2 months for at risk foot care ?-Patient advised to call the office if any problems or questions arise in the meantime. ? ? ?No follow-ups on file. ? ? ?No follow-ups on file. ? ? ?Lorenda Peck, DPM  ? ? ?

## 2021-06-22 ENCOUNTER — Encounter: Payer: Self-pay | Admitting: Family Medicine

## 2021-06-22 ENCOUNTER — Telehealth: Payer: Self-pay

## 2021-06-22 ENCOUNTER — Ambulatory Visit: Payer: Medicare PPO | Admitting: Family Medicine

## 2021-06-22 VITALS — BP 138/68 | HR 64 | Temp 97.4°F | Ht 71.0 in | Wt 254.8 lb

## 2021-06-22 DIAGNOSIS — I110 Hypertensive heart disease with heart failure: Secondary | ICD-10-CM | POA: Diagnosis not present

## 2021-06-22 DIAGNOSIS — K56609 Unspecified intestinal obstruction, unspecified as to partial versus complete obstruction: Secondary | ICD-10-CM | POA: Diagnosis not present

## 2021-06-22 DIAGNOSIS — D509 Iron deficiency anemia, unspecified: Secondary | ICD-10-CM

## 2021-06-22 DIAGNOSIS — S90822D Blister (nonthermal), left foot, subsequent encounter: Secondary | ICD-10-CM | POA: Diagnosis not present

## 2021-06-22 DIAGNOSIS — E1140 Type 2 diabetes mellitus with diabetic neuropathy, unspecified: Secondary | ICD-10-CM | POA: Diagnosis not present

## 2021-06-22 DIAGNOSIS — E782 Mixed hyperlipidemia: Secondary | ICD-10-CM | POA: Diagnosis not present

## 2021-06-22 DIAGNOSIS — I700 Atherosclerosis of aorta: Secondary | ICD-10-CM | POA: Insufficient documentation

## 2021-06-22 HISTORY — DX: Atherosclerosis of aorta: I70.0

## 2021-06-22 LAB — POCT GLYCOSYLATED HEMOGLOBIN (HGB A1C): Hemoglobin A1C: 6.9 % — AB (ref 4.0–5.6)

## 2021-06-22 LAB — LIPID PANEL
Cholesterol: 63 mg/dL (ref 0–200)
HDL: 34.1 mg/dL — ABNORMAL LOW (ref >39.00)
LDL Cholesterol: 13 mg/dL (ref 0–99)
NonHDL: 29.11
Total CHOL/HDL Ratio: 2
Triglycerides: 79 mg/dL (ref 0.0–149.0)
VLDL: 15.8 mg/dL (ref 0.0–40.0)

## 2021-06-22 LAB — GLUCOSE, POCT (MANUAL RESULT ENTRY): POC Glucose: 125 mg/dl — AB (ref 70–99)

## 2021-06-22 NOTE — Progress Notes (Signed)
?[facility] ?[facility] ?Shawn ?[place] [address] ?Dept: [phone] ?Dept Fax: [phone] ? ?Chronic Care Office Visit ? ?Subjective:  ? ? Patient ID: [place], male    DOB: November 17, 1935, 86 y.o..   MRN: [id number] ? ?Chief Complaint  ?Patient presents with  ? Follow-up  ?  3 month f/u. No concerns.  Fasting today.  Average BS 150-130  ? ? ?History of Present Illness: ? ?Patient is in today for reassessment of chronic medical issues. ? ?Shawn [place] had a hospital admission in Feb. for a small bowel obstruciton. He is currently being worked up by Dr. Bryan Lemma. They had plans to do endoscopy. However, while on clear liquids prior to the procedure, Shawn [place] had two low-impact falls. On his regular diet he has not experienced anything similar. Dr. Bryan Lemma canceled the procedure, but he did have a CT scan. ? ?Shawn [place] has an extensive history of CV disease. He had a prior heart attack and is s/p PTCA with stent placement. 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 He is currently managed on carvedilol and Lasix. He is also on Pradaxa for anticoagulation. He has hyperlipidemia, but is intolerant of statins, so is managed on ezetimibe and Repatha. ?  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 He is managed on Janumet and metformin.  ?  ?Shawn [place] had a blister on the sole of his left foot at his last appointment.  I referred him to podiatry and they are helping to manage the callus he had developed. ? ?Past Medical History: ?Patient Active Problem List  ? Diagnosis Date Noted  ? SBO (small bowel obstruction) ([place]) 05/17/2021  ? Hypertensive urgency 05/17/2021  ? UTI (urinary tract infection) 05/17/2021  ? Normocytic anemia 05/17/2021  ? History of lymphoma 05/17/2021  ? Iron deficiency anemia 04/27/2021  ? History of squamous cell  carcinoma in situ 04/18/2021  ? Statin intolerance 03/09/2021  ? Peritoneal adhesion 11/08/2020  ? Peripheral venous insufficiency 11/08/2020  ? Type 2 diabetes mellitus with stage 3a chronic kidney disease, without long-term current use of insulin ([place]) 02/22/2020  ? Old MI (myocardial infarction)   ? 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 Sensorineural hearing loss (SNHL), bilateral 10/04/2019  ? Stage 3a chronic kidney disease ([place]) 06/13/2019  ? Carpal tunnel syndrome of right wrist 03/25/2019  ? Diabetic polyneuropathy associated with type 2 diabetes mellitus ([place]) 12/16/2018  ? Blepharitis of both upper and lower eyelid 10/02/2018  ? Meibomian gland dysfunction (MGD) of both eyes 10/02/2018  ? Essential hypertension 11/07/2017  ? Orthostatic dizziness 09/15/2017  ? Dermatochalasis of both upper eyelids 11/21/2016  ? Chronic anticoagulation 10/11/2016  ? Male erectile disorder 06/17/2016  ? Hyperlipidemia 04/11/2016  ? Small bowel obstruction ([place]) 04/11/2016  ? BMI 35.0-35.9,adult 04/11/2016  ? Leukocytosis 04/11/2016  ? Arthritis 03/12/2016  ? Pseudophakia of both eyes 03/12/2016  ? Insomnia 11/27/2015  ? Ganglion cyst 11/27/2015  ? Chronic diastolic heart failure ([place]) 09/01/2015  ? 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 Obstructive sleep apnea 08/30/2015  ? Pulmonary hypertension ([place]) 08/30/2015  ? Restless leg syndrome 08/30/2015  ? Chronic atrial fibrillation ([place]) 06/12/2015  ? Gastroesophageal reflux disease without esophagitis 06/12/2015  ? Idiopathic chronic gout 05/17/2015  ? Coronary artery disease involving native coronary artery of native heart with angina pectoris (  [place]) 02/15/2015  ? Hypertensive heart disease with heart failure ([place]) 02/15/2015  ? Type 2 diabetes mellitus with neurologic complication, without long-term current use of insulin ([place]) 12/02/2014  ? ?Past Surgical History:  ?Procedure Laterality Date  ? ABDOMINAL  ADHESION SURGERY    ? x 2  ? BLEPHAROPLASTY Bilateral   ? CARPAL TUNNEL RELEASE Right 2021  ? CATARACT EXTRACTION Bilateral   ? CERVICAL SPINE SURGERY    ? CHOLECYSTECTOMY    ? CORONARY ANGIOPLASTY WITH STENT PLACEMENT  2000  ? NASAL SINUS SURGERY    ? ORIF TIBIAL SHAFT FRACTURE W/ PLATES AND SCREWS Left   ? PARTIAL KNEE ARTHROPLASTY Bilateral   ? SMALL INTESTINE SURGERY    ? Adenocarcinoma  ? TONSILLECTOMY    ? ?Family History  ?Problem Relation Age of Onset  ? Cancer Mother   ?     Ovarian  ? Diabetes Mother   ? Heart disease Mother   ? Heart disease Father   ? Stroke Father   ? Diabetes Father   ? Diabetes Sister   ? Diabetes Brother   ? Diabetes Brother   ? Cancer Paternal Grandfather   ?     Prostate  ? Colon cancer Neg Hx   ? Rectal cancer Neg Hx   ? Pancreatic cancer Neg Hx   ? Liver cancer Neg Hx   ? Stomach cancer Neg Hx   ? ?Outpatient Medications Prior to Visit  ?Medication Sig Dispense Refill  ? Alpha-Lipoic Acid 600 MG TABS Take 600 mg by mouth in the morning and at bedtime.    ? b complex vitamins capsule Take 1 capsule by mouth daily.    ? carvedilol (COREG) 3.125 MG tablet TAKE 1 TABLET BY MOUTH 2 TIMES A DAY WITH A MEAL (Patient taking differently: Take 3.125 mg by mouth 2 (two) times daily with a meal.) 180 tablet 3  ? dabigatran (PRADAXA) 150 MG CAPS capsule TAKE 1 CAPSULE BY MOUTH 2 TIMES DAILY (Patient taking differently: Take 150 mg by mouth 2 (two) times daily.) 180 capsule 2  ? ezetimibe (ZETIA) 10 MG tablet Take 10 mg by mouth daily.    ? furosemide (LASIX) 20 MG tablet TAKE 1 TABLET BY MOUTH 2 TIMES A DAY (Patient taking differently: Take 20 mg by mouth 2 (two) times daily.) 180 tablet 3  ? glucose blood test strip OneTouch Ultra Blue Test Strip    ? hydrocortisone 2.5 % ointment Apply 1 application topically 2 (two) times daily as needed (Skin Irritation).    ? Iron, Ferrous Sulfate, 325 (65 Fe) MG TABS Take 325 mg by mouth 2 (two) times daily. 60 tablet 2  ? JANUMET XR [phone] MG TB24  TAKE 1 TABLET BY MOUTH EVERY DAY WITH BREAKFAST (FOR DIABETES) (Patient taking differently: Take 1 tablet by mouth daily.) 90 tablet 0  ? Lancets (ONETOUCH DELICA PLUS [id number]) MISC OneTouch Delica Plus Lancet 33 gauge    ? Melatonin 5 MG CAPS Take 5 mg by mouth at bedtime.    ? metFORMIN (GLUCOPHAGE-XR) 500 MG 24 hr tablet Take 1 tablet (500 mg total) by mouth daily in the afternoon. 90 tablet 3  ? Multiple Vitamin (MULTI-VITAMINS PO) Take 6 tablets by mouth daily. Balance of nature ?3 green ?3 red    ? nitroGLYCERIN (NITROSTAT) 0.4 MG SL tablet PLACE 1 TABLET UNDER TONGUE EVERY 5 MINUTES AS NEEDED FOR CHEST PAIN (UP TO 3 DOSES) (Patient taking differently: Place 0.4 mg under the tongue every 5 (  five) minutes as needed for chest pain.) 25 tablet 11  ? polyethylene glycol (MIRALAX) 17 g packet Take 17 g by mouth daily as needed. 14 each 0  ? polyvinyl alcohol (LIQUIFILM TEARS) 1.4 % ophthalmic solution Place 1 drop into both eyes as needed for dry eyes.    ? REPATHA SURECLICK 037 MG/ML SOAJ Inject 140 mg into the skin every 14 (fourteen) days. 2 mL 6  ? Turmeric 400 MG CAPS Take 400 mg by mouth daily.    ? ?No facility-administered medications prior to visit.  ? ?Allergies  ?Allergen Reactions  ? Atorvastatin Other (See Comments)  ?  Dizziness.  ? Jardiance [Empagliflozin] Other (See Comments)  ?  Orthostasis  ? Ace Inhibitors Rash  ? Amoxicillin Rash  ? Meperidine Rash  ? Meperidine Hcl Rash  ? Naproxen Sodium Rash  ? Sulfa Antibiotics Rash  ? Sulfamethoxazole Rash  ? Tetracycline Rash  ? ?Objective:  ? ?Today's Vitals  ? 06/22/21 0833  ?BP: 138/68  ?Pulse: 64  ?Temp: (!) 97.4 ?F (36.3 ?C)  ?TempSrc: Temporal  ?SpO2: 97%  ?Weight: 254 lb 12.8 oz (115.6 kg)  ?Height: '5\' 11"'$  (1.803 m)  ? ?Body mass index is 35.54 kg/m?.  ? ?General: Well developed, well nourished. No acute distress. ?Foot: There is a shallow area where podiatry apparently debrided a callus on the sole of the left foot.  ? No sign of underlying  infection.  ?Psych: Alert and oriented. Normal mood and affect. ? ?Health Maintenance Due  ?Topic Date Due  ? TETANUS/TDAP  12/08/2018  ? ?Imaging: ?CT Entero Abd/Pelvis w contrast (06/15/2021) ?IMPRESSION: ?No

## 2021-06-22 NOTE — Telephone Encounter (Signed)
Patient called and left message to check on CMN. Says Dr. [name] faxed paperwork back. Reached out to patient to let them know we are investigating and will reach out to them once we know what has happened to the CMN ?

## 2021-06-22 NOTE — Telephone Encounter (Signed)
Spoke to patient at [facility] today and he reports that [facility] received the paperwork that was filled out by Dr [name] and they are having issues getting it to go through to company supply the shoes.  Dm/cma ? ?

## 2021-06-26 ENCOUNTER — Ambulatory Visit: Payer: Medicare PPO | Admitting: Podiatry

## 2021-06-27 DIAGNOSIS — R2681 Unsteadiness on feet: Secondary | ICD-10-CM | POA: Diagnosis not present

## 2021-06-28 ENCOUNTER — Telehealth: Payer: Self-pay | Admitting: Family Medicine

## 2021-06-28 NOTE — Telephone Encounter (Signed)
Shawn [place] with Dr. Juliane Lack office is returning a call (not sure from who) and informed me that the CMN for diabetic shoes is in the pts chart under media on 05/31/2021. The letter is attached and can be printed off if needed.  ? ? ?

## 2021-06-28 NOTE — Telephone Encounter (Signed)
[facility] called and asked if we could refax the order for diabetic shoes to fax [phone] and not the vendor. She said she can refax the paperwork if needed ?

## 2021-06-28 NOTE — Telephone Encounter (Signed)
Spoke to [facility], advised that form is scanned into the chart on 06/04/21. She will advised them.  Dm/cma ? ?

## 2021-06-29 ENCOUNTER — Ambulatory Visit (INDEPENDENT_AMBULATORY_CARE_PROVIDER_SITE_OTHER): Payer: Medicare PPO | Admitting: Gastroenterology

## 2021-06-29 ENCOUNTER — Encounter: Payer: Self-pay | Admitting: Gastroenterology

## 2021-06-29 ENCOUNTER — Other Ambulatory Visit: Payer: Self-pay

## 2021-06-29 VITALS — BP 120/60 | HR 74 | Ht 70.0 in | Wt 255.0 lb

## 2021-06-29 DIAGNOSIS — Z7901 Long term (current) use of anticoagulants: Secondary | ICD-10-CM

## 2021-06-29 DIAGNOSIS — K56609 Unspecified intestinal obstruction, unspecified as to partial versus complete obstruction: Secondary | ICD-10-CM | POA: Diagnosis not present

## 2021-06-29 DIAGNOSIS — E1122 Type 2 diabetes mellitus with diabetic chronic kidney disease: Secondary | ICD-10-CM | POA: Diagnosis not present

## 2021-06-29 DIAGNOSIS — R197 Diarrhea, unspecified: Secondary | ICD-10-CM

## 2021-06-29 DIAGNOSIS — R2681 Unsteadiness on feet: Secondary | ICD-10-CM | POA: Diagnosis not present

## 2021-06-29 DIAGNOSIS — N1831 Chronic kidney disease, stage 3a: Secondary | ICD-10-CM

## 2021-06-29 DIAGNOSIS — I272 Pulmonary hypertension, unspecified: Secondary | ICD-10-CM | POA: Diagnosis not present

## 2021-06-29 DIAGNOSIS — Z6836 Body mass index (BMI) 36.0-36.9, adult: Secondary | ICD-10-CM

## 2021-06-29 DIAGNOSIS — I509 Heart failure, unspecified: Secondary | ICD-10-CM

## 2021-06-29 DIAGNOSIS — D509 Iron deficiency anemia, unspecified: Secondary | ICD-10-CM

## 2021-06-29 DIAGNOSIS — E66812 Obesity, class 2: Secondary | ICD-10-CM

## 2021-06-29 DIAGNOSIS — C8599 Non-Hodgkin lymphoma, unspecified, extranodal and solid organ sites: Secondary | ICD-10-CM | POA: Diagnosis not present

## 2021-06-29 NOTE — Patient Instructions (Addendum)
If you are age 84 or older, your body mass index should be between 23-30. Your Body mass index is 36.59 kg/m?Marland Kitchen If this is out of the aforementioned range listed, please consider follow up with your Primary Care Provider. ? ?If you are age 63 or younger, your body mass index should be between 19-25. Your Body mass index is 36.59 kg/m?Marland Kitchen If this is out of the aformentioned range listed, please consider follow up with your Primary Care Provider.  ? ?________________________________________________________ ? ?The [facility] providers would like to encourage you to use [facility] to communicate with providers for non-urgent requests or questions.  Due to long hold times on the telephone, sending your provider a message by [facility] may be a faster and more efficient way to get a response.  Please allow 48 business hours for a response.  Please remember that this is for non-urgent requests.  ?_______________________________________________________ ? ?Please call in 3 months to schedule a follow up appointment.  ? ?Please go to the lab on the basement level at [facility] in 3 months. Please do this after 09-29-2021. Labs have been placed ? ?Please purchase the following medications over the counter and take as directed: ?Fiber supplement ? ?Fiber Content in Foods ?Fiber is a substance that is found in plant foods, such as fruits, vegetables, whole grains, nuts, seeds, and beans. As part of your treatment and recovery plan, your health care provider may recommend that you eat foods that have specific amounts of dietary fiber. Some conditions may require a high-fiber diet while others may require a low-fiber diet. ?This sheet gives you information about the dietary fiber content of some common foods. Your health care provider will tell you how much fiber you need in your diet. If you have problems or questions, contact your health care provider or dietitian. ?What foods are high in fiber? ?Fruits ?Blackberries  or raspberries (fresh) -- ? cup (75 g) has 4 g of fiber. ?Pear (fresh) -- 1 medium (180 g) has 5.5 g of fiber. ?Prunes (dried) -- 6 to 8 pieces (57-76 g) has 5 g of fiber. ?Apple with skin -- 1 medium (182 g) has 4.8 g of fiber. ?Guava -- 1 cup (128 g) has 8.9 g of fiber. ?Vegetables ?Peas (frozen) -- ? cup (80 g) has 4.4 g of fiber. ?Potato with skin (baked) -- 1 medium (173 g) has 4.4 g of fiber. ?Pumpkin (canned) -- ? cup (122 g) has 5 g of fiber. ?Brussels sprouts (cooked) -- ? cup (78 g) has 4 g of fiber. ?Sweet potato -- ? cup mashed (124 g) has 4 g of fiber. ?Winter squash -- 1 cup cooked (205 g) has 5.7 g of fiber. ?Grains ?Bran cereal -- ? cup (31 g) has 8.6 g of fiber. ?Bulgur (cooked) -- ? cup (70 g) has 4 g of fiber. ?Quinoa (cooked) -- 1 cup (185 g) has 5.2 g of fiber. ?Popcorn -- 3 cups (375 g) popped has 5.8 g of fiber. ?Spaghetti, whole wheat -- 1 cup (140 g) has 6 g of fiber. ?Meats and other proteins ?Pinto beans (cooked) -- ? cup (90 g) has 7.7 g of fiber. ?Lentils (cooked) -- ? cup (90 g) has 7.8 g of fiber. ?Kidney beans (canned) -- ? cup (92.5 g) has 5.7 g of fiber. ?Soybeans (canned, frozen, or fresh) -- ? cup (92.5 g) has 5.2 g of fiber. ?Baked beans, plain or vegetarian (canned) -- ? cup (130 g) has 5.2 g  of fiber. ?Garbanzo beans or chickpeas (canned) -- ? cup (90 g) has 6.6 g of fiber. ?Black beans (cooked) -- ? cup (86 g) has 7.5 g of fiber. ?White beans or navy beans (cooked) -- ? cup (91 g) has 9.3 g of fiber. ?The items listed above may not be a complete list of foods with high fiber. Actual amounts of fiber may be different depending on processing. Contact a dietitian for more information. ?What foods are moderate in fiber? ?Fruits ?Banana -- 1 medium (126 g) has 3.2 g of fiber. ?Melon -- 1 cup (155 g) has 1.4 g of fiber. ?Orange -- 1 small (154 g) has 3.7 g of fiber. ?Raisins -- ? cup (40 g) has 1.8 g of fiber. ?Applesauce, sweetened -- ? cup (125 g) has 1.5 g of fiber. ?Blueberries  (fresh) -- ? cup (75 g) has 1.8 g of fiber. ?Strawberries (fresh, sliced) -- 1 cup (712 g) has 3 g of fiber. ?Cherries -- 1 cup (140 g) has 2.9 g of fiber. ?Vegetables ?Broccoli (cooked) -- ? cup (77.5 g) has 2.1 g of fiber. ?Carrots (cooked) -- ? cup (77.5 g) has 2.2 g of fiber. ?Corn (canned or frozen) -- ? cup (82.5 g) has 2.1 g of fiber. ?Potatoes, mashed -- ? cup (105 g) has 1.6 g of fiber. ?Tomato -- 1 medium (62 g) has 1.5 g of fiber. ?Green beans (canned) -- ? cup (83 g) has 2 g of fiber. ?Squash, winter -- ? cup (58 g) has 1 g of fiber. ?Sweet potato, baked -- 1 medium (150 g) has 3 g of fiber. ?Cauliflower (cooked) -- 1/2 cup (90 g) has 2.3 g of fiber. ?Grains ?Long-grain brown rice (cooked) -- 1 cup (196 g) has 3.5 g of fiber. ?Bagel, plain -- one 4-inch (10 cm) bagel has 2 g of fiber. ?Instant oatmeal -- ? cup (120 g) has about 2 g of fiber. ?Macaroni noodles, enriched (cooked) -- 1 cup (140 g) has 2.5 g of fiber. ?Multigrain cereal -- ? cup (15 g) has about 2-4 g of fiber. ?Whole-wheat bread -- 1 slice (26 g) has 2 g of fiber. ?Whole-wheat spaghetti noodles -- ? cup (70 g) has 3.2 g of fiber. ?Corn tortilla -- one 6-inch (15 cm) tortilla has 1.5 g of fiber. ?Meats and other proteins ?Almonds -- ? cup or 1 oz (28 g) has 3.5 g of fiber. ?Sunflower seeds in shell -- ? cup or ? oz (11.5 g) has 1.1 g of fiber. ?Vegetable or soy patty -- 1 patty (70 g) has 3.4 g of fiber. ?Walnuts -- ? cup or 1 oz (30 g) has 2 g of fiber. ?Flax seed -- 1 Tbsp (7 g) has 2.8 g of fiber. ?The items listed above may not be a complete list of foods that have moderate amounts of fiber. Actual amounts of fiber may be different depending on processing. Contact a dietitian for more information. ?What foods are low in fiber? ?Low-fiber foods contain less than 1 g of fiber per serving. They include: ?Fruits ?Fruit juice -- ? cup or 4 fl oz (118 mL) has 0.5 g of fiber. ?Vegetables ?Lettuce -- 1 cup (35 g) has 0.5 g of fiber. ?Cucumber  (slices) -- ? cup (60 g) has 0.3 g of fiber. ?Celery -- 1 stalk (40 g) has 0.1 g of fiber. ?Grains ?Flour tortilla -- one 6-inch (15 cm) tortilla has 0.5 g of fiber. ?White rice (cooked) -- ? cup (81.5 g) has 0.3  g of fiber. ?Meats and other proteins ?Egg -- 1 large (50 g) has 0 g of fiber. ?Meat, poultry, or fish -- 3 oz (85 g) has 0 g of fiber. ?Dairy ?Milk -- 1 cup or 8 fl oz (237 mL) has 0 g of fiber. ?Yogurt -- 1 cup (245 g) has 0 g of fiber. ?The items listed above may not be a complete list of foods that are low in fiber. Actual amounts of fiber may be different depending on processing. Contact a dietitian for more information. ?Summary ?Fiber is a substance that is found in plant foods, such as fruits, vegetables, whole grains, nuts, seeds, and beans. ?As part of your treatment and recovery plan, your health care provider may recommend that you eat foods that have specific amounts of dietary fiber. ?This information is not intended to replace advice given to you by your health care provider. Make sure you discuss any questions you have with your health care provider. ?Document Revised: 07/29/2019 Document Reviewed: 07/29/2019 ?Elsevier Patient Education ? 2022 [name]. ? ? ? ? ? ? ? ?We want to thank you for trusting [facility] with your care. All of our staff and providers value the relationships we have built with our patients, and it is an honor to care for you.  ? ?We are writing to let you know that [facility] will close on Aug 20, 2021, and we invite you to continue to see Dr. Carmell Austria and Gerrit Heck at the [facility] [place] office location. We are consolidating our serices at these [facility] practices to better provide care. Our office staff will work with you to ensure a seamless transition.  ? ?Gerrit Heck, DO -Dr. Bryan Lemma will be movig to [facility] at [address], [place], [place] [address], effective  Aug 20, 2021.  Contact [phone] to schedule an appointment with him.  ? ?Carmell Austria, MD- Dr. Lyndel Safe will be movig to [facility] at [address], [place], [place] [address], effective

## 2021-06-29 NOTE — Progress Notes (Signed)
? ?Chief Complaint:    Iron deficiency anemia, f/u on CT ? ?GI History: Shawn Blanchard is a 86 y.o. male with a history of CHF (TTE 12/2019 with EF 45-50%), atrial fibrillation (on Pradaxa), CAD s/p PCI 2002, diabetes with polyneuropathy, HTN, dyslipidemia, OSA (on CPAP), pulmonary hypertension, obesity (BMI 36), cholecystectomy, small bowel lymphoma s/p resection in 1990 (approximately 13 inches removed and then treated with chemotherapy), laparoscopy with LOA x2, initially seen by me in the GI clinic on 05/15/2021 for evaluation of iron deficiency anemia and diarrhea. ? ?Had planned for EGD/colonoscopy on 06/13/2021.  However, procedure canceled due to nausea, fatigue, and falls x2 the day prior to procedures (prior to starting any bowel preparation). ? ?Recent evaluation to date: ?- 12/2019: CT AP: Hepatic steatosis, ccy.  Normal spleen and pancreas.  Previous small bowel resection with proximal small bowel distention similar to 04/2016.  Transition point in right mid abdomen c/w early vs pSBO.  Diverticulosis with abundant colonic stool ?- H/H 12.5/39 with MCV/RDW 83/15.7.  Previously 15/46 with MCV/RDW 88/14 ?- Normal CMP ?- Ferritin 62, iron 36, TIBC 299, sat 12% ?- Normal vitamin D, B12, and folate ?- Normal TSH, T4 ?- Normal CRP ?- ESR 40 ?- Fecal calprotectin 88 (borderline) ?- Negative C. difficile, O&P, GI PCR panel, pancreatic elastase ?- 06/15/2021: CT enterography: Resolution of previous small bowel dilation with otherwise normal-appearing small bowel.  Sigmoid diverticulosis with increased sigmoid colon wall thickening/inflammation, but not features of diverticulitis ? ? ?Last EGD and colonoscopy were in the 1990's per Shawn Blanchard.  ? ?HPI:   ? ? ?Shawn Blanchard is a 86 y.o. male presenting to the [facility]astroenterology Clinic for follow-up.  ? ?He was admitted to the hospital 2/8-14 with SBO (managed nonoperatively), UTI, and hypertensive urgency. ? ?As above, had planned for EGD/colonoscopy at [facility] earlier this  month.  Procedures were canceled due to nausea, fatigue, and two falls at home the day prior.  He attributes this to clear liquid diet.  Had not yet started bowel preparation.  He did subsequently complete CT enterography which demonstrates resolution of previous small bowel dilation and otherwise normal-appearing small bowel without any e/o small bowel malignancy, lymphoma recurrence, lead point, ulceration, etc. ? ?He was seen in follow-up by Dr. [name] on 06/22/2021. ? ?He presents to the office today with wife and daughter. ? ?Has been tolerating oral iron without issue.  Has had dark stools since starting the oral iron.  No hematochezia, nausea, vomiting. ? ?Has had uptrend in H/H since starting iron, with Hgb nadir of 10.7 and most recent on 03/28/2022 of 12.0. ? ? ? ?  Latest Ref Rng & Units 05/28/2021  ? 11:54 AM 05/22/2021  ?  2:03 AM 05/21/2021  ?  8:34 AM  ?CBC  ?WBC 4.0 - 10.5 K/uL 8.0   5.6   6.1    ?Hemoglobin 13.0 - 17.0 g/dL 11.9   10.7   11.3    ?Hematocrit 39.0 - 52.0 % 36.0   35.0   35.7    ?Platelets 150.0 - 400.0 K/uL 228.0   154   156    ? ? ? ? ? ?Review of systems:     No chest pain, no SOB, no fevers, no urinary sx  ? ?Past Medical History:  ?Diagnosis Date  ? Acute on chronic systolic congestive heart failure ([place]) 11/07/2017  ? 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 Arthritis 03/12/2016  ? Bilateral hand pain 03/25/2019  ? Blepharitis of both  upper and lower eyelid 10/02/2018  ? Carpal tunnel syndrome of right wrist 03/25/2019  ? Chronic anticoagulation 10/11/2016  ? Chronic atrial fibrillation ([place]) 06/12/2015  ? Overview:  Managed CARDS  ? Chronic diastolic heart failure ([place]) 09/01/2015  ? Coronary artery disease involving native coronary artery of native heart with angina pectoris ([place]) 02/15/2015  ? Overview:  Hx of MI with stent to LAD 2002 MPS 2016 with EF 47% and no ischemia  ? Dermatochalasis of both upper eyelids 11/21/2016  ? Dermatochalasis of right upper eyelid 10/02/2018  ? Diabetic  polyneuropathy associated with type 2 diabetes mellitus ([place]) 12/16/2018  ? 2020  ? Dyslipidemia 11/07/2017  ? Essential hypertension 11/07/2017  ? Ganglion cyst 11/27/2015  ? Overview:  2017: dorsum right hand  ? Gastroesophageal reflux disease without esophagitis 06/12/2015  ? Hyperlipidemia 04/11/2016  ? Hypertensive heart disease with heart failure ([place]) 02/15/2015  ? Idiopathic chronic gout 05/17/2015  ? Insomnia 11/27/2015  ? Leukocytosis 04/11/2016  ? Lightheadedness 10/11/2016  ? Long term current use of oral hypoglycemic drug 10/02/2018  ? 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[place]) 08/30/2015  ? Male erectile disorder 06/17/2016  ? Meibomian gland dysfunction (MGD) of both eyes 10/02/2018  ? MI (myocardial infarction) ([place]) 02/15/2015  ? Overview:  2002  ? Obesity 04/11/2016  ? 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 exacerbation ([place]) 06/12/2015  ? Obstructive sleep apnea 08/30/2015  ? Overview:  CPAP  ? Old MI (myocardial infarction)   ? Orthostatic dizziness 09/15/2017  ? 2018: med related  ? Permanent atrial fibrillation ([place]) 06/12/2015  ? Overview:  Managed CARDS  ? Pseudophakia of both eyes 03/12/2016  ? Pulmonary hypertension ([place]) 08/30/2015  ? Overview:  Managed CARDS  ? Purulent inflammation of skin 12/29/2018  ? Restless leg syndrome 08/30/2015  ? Restrictive lung disease 12/26/2016  ? Overview:  2018: mod on spirometry  ? SBO (small bowel obstruction) ([place]) 12/14/2019  ? Senile nuclear sclerosis 05/02/2016  ? Small bowel obstruction ([place]) 04/11/2016  ? SOB (shortness of breath) 08/14/2015  ? Type 2 diabetes mellitus with neurologic complication, without long-term current use of insulin ([place]) 12/02/2014  ? Unilateral inguinal hernia without obstruction or gangrene 06/12/2015  ? ? ?Shawn Blanchard's surgical history, family medical history, social history, medications and allergies were all reviewed in Epic  ? ? ?Current Outpatient Medications  ?Medication Sig Dispense Refill  ? Alpha-Lipoic Acid 600 MG TABS Take 600 mg by mouth in the morning and at bedtime.     ? b complex vitamins capsule Take 1 capsule by mouth daily.    ? carvedilol (COREG) 3.125 MG tablet TAKE 1 TABLET BY MOUTH 2 TIMES A DAY WITH A MEAL (Shawn Blanchard taking differently: Take 3.125 mg by mouth 2 (two) times daily with a meal.) 180 tablet 3  ? dabigatran (PRADAXA) 150 MG CAPS capsule TAKE 1 CAPSULE BY MOUTH 2 TIMES DAILY (Shawn Blanchard taking differently: Take 150 mg by mouth 2 (two) times daily.) 180 capsule 2  ? ezetimibe (ZETIA) 10 MG tablet Take 10 mg by mouth daily.    ? furosemide (LASIX) 20 MG tablet TAKE 1 TABLET BY MOUTH 2 TIMES A DAY (Shawn Blanchard taking differently: Take 20 mg by mouth 2 (two) times daily.) 180 tablet 3  ? glucose blood test strip OneTouch Ultra Blue Test Strip    ? hydrocortisone 2.5 % ointment Apply 1 application topically 2 (two) times daily as needed (Skin Irritation).    ? Iron, Ferrous Sulfate, 325 (65 Fe) MG TABS Take 325 mg by mouth  2 (two) times daily. 60 tablet 2  ? JANUMET XR [phone] MG TB24 TAKE 1 TABLET BY MOUTH EVERY DAY WITH BREAKFAST (FOR DIABETES) (Shawn Blanchard taking differently: Take 1 tablet by mouth daily.) 90 tablet 0  ? Lancets (ONETOUCH DELICA PLUS [id number]) MISC OneTouch Delica Plus Lancet 33 gauge    ? Melatonin 5 MG CAPS Take 5 mg by mouth at bedtime.    ? metFORMIN (GLUCOPHAGE-XR) 500 MG 24 hr tablet Take 1 tablet (500 mg total) by mouth daily in the afternoon. 90 tablet 3  ? Multiple Vitamin (MULTI-VITAMINS PO) Take 6 tablets by mouth daily. Balance of nature ?3 green ?3 red    ? nitroGLYCERIN (NITROSTAT) 0.4 MG SL tablet PLACE 1 TABLET UNDER TONGUE EVERY 5 MINUTES AS NEEDED FOR CHEST PAIN (UP TO 3 DOSES) (Shawn Blanchard taking differently: Place 0.4 mg under the tongue every 5 (five) minutes as needed for chest pain.) 25 tablet 11  ? polyethylene glycol (MIRALAX) 17 g packet Take 17 g by mouth daily as needed. 14 each 0  ? polyvinyl alcohol (LIQUIFILM TEARS) 1.4 % ophthalmic solution Place 1 drop into both eyes as needed for dry eyes.    ? REPATHA SURECLICK 950 MG/ML  SOAJ Inject 140 mg into the skin every 14 (fourteen) days. 2 mL 6  ? Turmeric 400 MG CAPS Take 400 mg by mouth daily.    ? ?No current facility-administered medications for this visit.  ? ? ?Physical Ex

## 2021-07-02 ENCOUNTER — Other Ambulatory Visit: Payer: Self-pay | Admitting: Family Medicine

## 2021-07-02 DIAGNOSIS — R2681 Unsteadiness on feet: Secondary | ICD-10-CM | POA: Diagnosis not present

## 2021-07-02 DIAGNOSIS — D509 Iron deficiency anemia, unspecified: Secondary | ICD-10-CM

## 2021-07-04 ENCOUNTER — Ambulatory Visit: Payer: Medicare PPO | Admitting: Podiatry

## 2021-07-04 DIAGNOSIS — R2681 Unsteadiness on feet: Secondary | ICD-10-CM | POA: Diagnosis not present

## 2021-07-05 DIAGNOSIS — R2681 Unsteadiness on feet: Secondary | ICD-10-CM | POA: Diagnosis not present

## 2021-07-09 ENCOUNTER — Telehealth: Payer: Self-pay

## 2021-07-09 DIAGNOSIS — R2681 Unsteadiness on feet: Secondary | ICD-10-CM | POA: Diagnosis not present

## 2021-07-09 DIAGNOSIS — E1140 Type 2 diabetes mellitus with diabetic neuropathy, unspecified: Secondary | ICD-10-CM | POA: Diagnosis not present

## 2021-07-09 NOTE — Telephone Encounter (Signed)
Shoes Ordered - Apex Men - Athletic Strap - Black A6000M 11.5W ?

## 2021-07-11 DIAGNOSIS — R2681 Unsteadiness on feet: Secondary | ICD-10-CM | POA: Diagnosis not present

## 2021-07-11 DIAGNOSIS — E1140 Type 2 diabetes mellitus with diabetic neuropathy, unspecified: Secondary | ICD-10-CM | POA: Diagnosis not present

## 2021-07-12 DIAGNOSIS — E1140 Type 2 diabetes mellitus with diabetic neuropathy, unspecified: Secondary | ICD-10-CM | POA: Diagnosis not present

## 2021-07-12 DIAGNOSIS — R2681 Unsteadiness on feet: Secondary | ICD-10-CM | POA: Diagnosis not present

## 2021-07-16 DIAGNOSIS — R2681 Unsteadiness on feet: Secondary | ICD-10-CM | POA: Diagnosis not present

## 2021-07-18 ENCOUNTER — Ambulatory Visit: Payer: Medicare PPO

## 2021-07-18 DIAGNOSIS — E1140 Type 2 diabetes mellitus with diabetic neuropathy, unspecified: Secondary | ICD-10-CM | POA: Diagnosis not present

## 2021-07-18 DIAGNOSIS — E11621 Type 2 diabetes mellitus with foot ulcer: Secondary | ICD-10-CM

## 2021-07-18 DIAGNOSIS — L84 Corns and callosities: Secondary | ICD-10-CM

## 2021-07-18 DIAGNOSIS — E1142 Type 2 diabetes mellitus with diabetic polyneuropathy: Secondary | ICD-10-CM

## 2021-07-18 DIAGNOSIS — R2681 Unsteadiness on feet: Secondary | ICD-10-CM | POA: Diagnosis not present

## 2021-07-18 NOTE — Progress Notes (Signed)
SITUATION ?Reason for Visit: Fitting of Diabetic [place] ?Patient / Caregiver Report:  Patient is satisfied with fit and function of shoes and insoles. ? ?OBJECTIVE DATA: ?Patient History / Diagnosis:   ?  ICD-10-CM   ?1. Diabetic polyneuropathy associated with type 2 diabetes mellitus ([place])  E11.42   ?  ?2. Diabetic ulcer of toe of left foot associated with type 2 diabetes mellitus, with fat layer exposed ([place])  E11.621   ? P61.950   ?  ?3. Pre-ulcerative calluses  L84   ?  ? ? ?Change in Status:   None ? ?ACTIONS PERFORMED: ?In-Person Delivery, patient was fit with: ?- 1x pair A5500 PDAC approved prefabricated Diabetic Shoes: Apex A6000M Size 11.5W ?- 3x pair D3267 PDAC approved vacuum formed custom diabetic insoles; [employer]: [id number] ? ?Shoes and insoles were verified for structural integrity and safety. Patient wore shoes and insoles in office. Skin was inspected and free of areas of concern after wearing shoes and inserts. Shoes and inserts fit properly. Patient / Caregiver provided with ferbal instruction and demonstration regarding donning, doffing, wear, care, proper fit, function, purpose, cleaning, and use of shoes and insoles ' and in all related precautions and risks and benefits regarding shoes and insoles. Patient / Caregiver was instructed to wear properly fitting socks with shoes at all times. Patient was also provided with verbal instruction regarding how to report any failures or malfunctions of shoes or inserts, and necessary follow up care. Patient / Caregiver was also instructed to contact physician regarding change in status that may affect function of shoes and inserts.  ? ?Patient / Caregiver verbalized undersatnding of instruction provided. Patient / Caregiver demonstrated independence with proper donning and doffing of shoes and inserts. ? ?PLAN ?Patient to follow with treating physician as recommended. Plan of care was discussed with and agreed upon by patient and/or caregiver.  All questions were answered and concerns addressed. ? ?

## 2021-07-19 DIAGNOSIS — R2681 Unsteadiness on feet: Secondary | ICD-10-CM | POA: Diagnosis not present

## 2021-07-19 DIAGNOSIS — E1140 Type 2 diabetes mellitus with diabetic neuropathy, unspecified: Secondary | ICD-10-CM | POA: Diagnosis not present

## 2021-07-24 ENCOUNTER — Ambulatory Visit: Payer: Medicare PPO | Admitting: Family Medicine

## 2021-07-24 ENCOUNTER — Encounter: Payer: Self-pay | Admitting: Family Medicine

## 2021-07-24 ENCOUNTER — Telehealth: Payer: Self-pay | Admitting: Family Medicine

## 2021-07-24 VITALS — BP 130/60 | HR 88 | Temp 96.7°F | Ht 70.0 in | Wt 258.6 lb

## 2021-07-24 DIAGNOSIS — R2681 Unsteadiness on feet: Secondary | ICD-10-CM | POA: Diagnosis not present

## 2021-07-24 DIAGNOSIS — H60331 Swimmer's ear, right ear: Secondary | ICD-10-CM

## 2021-07-24 DIAGNOSIS — R42 Dizziness and giddiness: Secondary | ICD-10-CM | POA: Diagnosis not present

## 2021-07-24 DIAGNOSIS — E1140 Type 2 diabetes mellitus with diabetic neuropathy, unspecified: Secondary | ICD-10-CM | POA: Diagnosis not present

## 2021-07-24 MED ORDER — MECLIZINE HCL 12.5 MG PO TABS: 12.5000 mg | ORAL_TABLET | Freq: Three times a day (TID) | ORAL | 0 refills | Status: DC | PRN

## 2021-07-24 MED ORDER — CIPROFLOXACIN HCL 0.2 % OT SOLN: 0.2000 mL | Freq: Two times a day (BID) | OTIC | 0 refills | Status: AC

## 2021-07-24 MED ORDER — CIPRO HC 0.2-1 % OT SUSP: 3.0000 [drp] | Freq: Two times a day (BID) | OTIC | 0 refills | Status: DC

## 2021-07-24 NOTE — Telephone Encounter (Signed)
Shawn Blanchard  is calling needing a script comparable to ciprofloxacin-hydrocortisone (CIPRO HC) OTIC suspension [[id number]]. They are not able to get a hold of this script any longer.  ?[name], Shawn Blanchard - [address]  ?[place], [place] [address]  ?Phone:  [phone]  Fax:  [phone]  ?

## 2021-07-24 NOTE — Patient Instructions (Addendum)
Use ciprofloxacin drops in the ear as prescribed.  Take meclizine as needed for dizziness and vertigo.  Please follow-up in a few days if no improvement.  Please see the following vestibular exercises to help with balance as needed. ? ?Vestibular (Balance) Exercises ?Introduction ? ?You have a problem with your balance or equilibrium. Do not be afraid of your dizziness. Only you can build up the tolerance in your brain to overcome your dizziness. It is like an exercise for muscle building. It requires regular, capacity-extending work to build up strength or tolerance. Keep provoking your dizziness many times each day, realizing that each purposeful, controlled episode of dizziness brings you closer to your last one. Once you have gained a measure of improvement and control in your practice sessions, seek out sports or other movement activities that have been difficult and spend increasing lengths of time on them until they no longer produce any symptoms. ? ?How do vestibular exercises work? ? ?The purpose of these exercises is to improve one?s central or brain?s compensation for injuries or abnormalities within the vestibular or balance system. The brain interprets information gained from the vestibular or balance system. When there is an injury or abnormality in any portion of this system, the brain must be retrained or taught to interpret correctly the information it receives. Vestibular exercises merely stimulate the vestibular apparatus. This stimulation produces information to be processed by the brain. The goal in repeating these exercises is for the brain to learn to tolerate and accurately interpret this type of stimulation. By doing these exercises repetitively, one can even teach the brain to adapt to an abnormal stimulus. These exercises work in much the same way as the exercises skaters or dancers do to keep from becoming dizzy when they spin around rapidly. Stated simply, one must seek out and overcome  those positions or situations which cause dizziness. Avoiding them will only prolong one?s convalescence. ? ?Aims of exercises: ? ?To train movement of the eyes, independent of the head. ?To practice balancing in everyday situations with special attention to developing the use of the eyes and the muscle sense awareness. ?To practice head movements that cause dizziness. ?To become accustomed to moving about naturally in daylight and in the dark. ?Generally, to encourage the re-building of confidence in making easy, relaxed, spontaneous movements. ?When you begin: ? ?During the first few times the exercise is performed, you should have another person present in case the dizziness becomes very severe. ? ?When should you stop doing the exercise? ? ?These exercises should be done at least three times a day for a minimum of 6 to 12 weeks or until the dizziness goes away altogether. Stopping before complete resolution of dizziness often results in a relapse in symptoms. The point at which one stops the exercises is when one has no dizziness for two consecutive weeks. The exercise may be stopped and restarted again at any time if dizziness returns. ? ?The following exercises should be performed twice daily. The exercises are designed to challenge your balance system and often cause symptoms of dizziness. This is a normal response to these stimulating exercises. You should try to work through these symptoms if possible. If you feel you cannot, have your nurse contact an inpatient physical therapist for assistance. ? ?Methods: ? ?Work up to doing each movement 20 times. ?All exercises are started in exaggerated slow time and gradually increase speed to a more rapid rate. ?Be sure to continue exercises even though you become dizzy.  Pause and rest only if you become nauseated or sick to your stomach. If ill, after resting, try a different exercise. If you still become sick to your stomach, postpone further work until your next  session. ? ?Head Exercises ? ?Bending: ?In a sitting position, bend your head down to look at the floor then up to look at the ceiling. ?Lead your head with your eyes focusing on the floor and the ceiling. ?Repeat this 10 times. Stop and wait for symptoms to resolve, about 30 seconds. ?Repeat entire process 2 more times. ?Turning (side to side): ?In a sitting position, turn your head to the right and then left, leading your head with your eyes as if you are watching a tennis match. ?Turn your head at a speed brisk enough to generate symptoms but not so fast that you strain your neck. (Slowly first, then quickly.) ?Go back and forth 10 times, and then wait for 30 seconds (or until symptoms resolve). ?Repeat entire process 2 more times. ?As the dizziness improves: ? ?Perform head exercises with eyes closed. ?Progress to standing while performing head exercises. ?The closer together you put your feet, the more challenging it becomes. ? ?  ? ?Sitting ? ?Shrug shoulders - 20 times. ?Turn shoulders to right and then to left - 20 times. ?Rotate head, shoulders and trunk - 20 times each: ?Rotate upper body right to left with eyes open, then repeat with eyes closed. ?Rotate upper body left to right with eyes open, then repeat with eyes closed. ?Bend forward and touch ground then sit up. Keep eyes focused on wall - 20 times. ?Bend forward and touch ground then sit up. Move eyes to floor and back - 20 times. ?Eye movements (head is still): ?Up and down (focusing on finger). ?Side to side (focusing on finger). ?Finger to tip of nose and out (focusing on finger). ? ?Standing ?Change from sitting to standing and back again ?20 times with eyes open. ?Repeat with eyes closed. ?Standing with one foot in front of the other ?In a corner, practice standing ?heel to toe? (one foot in front of the other with the heel of one foot touching the toe of the other foot) with eyes open for 30 seconds. The goal is to stand for the entire 30  seconds without touching the wall. You may make this more challenging by crossing arms across chest. If this is too hard at first, try standing ?almost heel to toe? (with feet touching at big toes and ankles). Once you have mastered these with eyes open, practice with eyes closed. ?Standing on a cushion ?In a corner, stand on a couch cushion or several pillows. Try to stand still without touching the wall for 30 seconds. Practice with eyes open. When this is easy, practice with eyes closed. You may make this more challenging by placing feet closer together. Crossing arm across your chest also makes this more challenging. You should progress by performing this in the most challenging position possible. ?Standing and throwing ?Throw a small rubber ball from hand to hand above eye level. ?Throw ball from hand to hand under one knee. ?Stand with heels together ?Look straight ahead and hold balance (attempt only with assistance). ?Stand on one foot ?Perform first with eyes open then with eyes closed (attempt only with assistance). ?Miscellaneous ?Do activities involving stooping, stretching, bending, going up and down stairs (attempt only with assistance). ? ?Walking ?Walking in a straight line ?In a hallway or next to a wall, practice  walking in a straight line for 5 minutes with one foot in front of the other or ?heel to toe? (with the heel of one foot touching the toe of the other foot). If this is too hard at first, practice walking ?almost heel to toe? and gradually work to heel to toe touching. ?Walking combined with head turning ?In a hallway or open space, practice walking in a straight line while turning head and eyes left and right with every other step (i.e. when you step with your left you look left, when you step with your right you look right). Continue for the length of the hallway or about 20 feet. Repeat the process 3 times. Now repeat the entire process 3 more times but this time looking at the ceiling  or floor. You will need to rest between repetitions and let symptoms calm. ?Walk across the room ?Perform first with eyes open, then with eyes closed (attempt only with assistance). ?Lying Down ?Sitting o

## 2021-07-24 NOTE — Addendum Note (Signed)
Addended by: Josephine Igo B on: 07/24/2021 05:20 PM ? ? Modules accepted: Orders ? ?

## 2021-07-24 NOTE — Progress Notes (Signed)
? ?  Acute Office Visit ? ?Subjective:  ? ?  ?Patient ID: Shawn Blanchard, male    DOB: 1935/05/05, 86 y.o.   MRN: [id number] ? ?Chief Complaint  ?Patient presents with  ? Ear Fullness  ?  Pt c/o right ear feeling painful to touch. When standing and walking  feeling dizzy x 1 week. Hearing has gotten worst.  ? ? ?HPI ?Patient is in today for 1 week of ear pain, with 1 day of dizziness.  These are on the right side.  Patient says he is also has some hearing changes, describes it as "echoing".   patient was at physical therapy today where he was unsteady on his feet.  This why he decided come to the visit today for further evaluation.  He is concerned of fall.  He is accompanied by his wife.  Patient does have a history of vertigo.  42 has taken meclizine in the past which did help him.  He has not tried it for this recent event. Denies any hearing loss.  Denies any ear drainage.  Denies any fevers.  Denies headache. ? ?ROS ?As per HPI ? ?   ?Objective:  ?  ?BP 130/60 (BP Location: Left Arm, Patient Position: Sitting, Cuff Size: Normal)   Pulse 88   Temp (!) 96.7 ?F (35.9 ?C) (Temporal)   Ht '5\' 10"'$  (1.778 m)   Wt 258 lb 9.6 oz (117.3 kg)   SpO2 98%   BMI 37.11 kg/m?  ? ? ?Gen: NAD, resting comfortably,Ambulates with walker ?HEENT: Right auricle tender, bilateral ear canals and tympanic membranes appear normal, EOMI, PERRLA, no nystagmus ?CV: RRR with no murmurs appreciated ?Pulm: NWOB, CTAB with no crackles, wheezes, or rhonchi ?MSK: no edema, cyanosis, or clubbing noted ?Skin: warm, dry ?Neuro: grossly normal, moves all extremities, unsteady gait requiring assistance or walker ?Psych: Normal affect and thought content ? ? ?No results found for any visits on 07/24/21. ? ? ?   ?Assessment & Plan:  ? ?Right ear pain associated with vertigo ?Possibly a complicated otitis media, but does not appear this way on exam ?Could be labyrinthitis, but unsure of any particular cause given ?We will treat for otitis externa  on the right with ciprofloxacin and hydrocortisone ?Treat vertiginous symptoms with meclizine and vestibular exercises at home ?Return precautions discussed ? ?Problem List Items Addressed This Visit   ?None ?Visit Diagnoses   ? ? Acute swimmer's ear of right side    -  Primary  ? Relevant Medications  ? ciprofloxacin-hydrocortisone (CIPRO HC) OTIC suspension  ? Vertigo      ? Relevant Medications  ? meclizine (ANTIVERT) 12.5 MG tablet  ? ?  ? ? ?Meds ordered this encounter  ?Medications  ? meclizine (ANTIVERT) 12.5 MG tablet  ?  Sig: Take 1 tablet (12.5 mg total) by mouth 3 (three) times daily as needed for dizziness.  ?  Dispense:  30 tablet  ?  Refill:  0  ? ciprofloxacin-hydrocortisone (CIPRO HC) OTIC suspension  ?  Sig: Place 3 drops into the right ear 2 (two) times daily for 5 days.  ?  Dispense:  10 mL  ?  Refill:  0  ? ? ?Return in about 3 days (around 07/27/2021), or if symptoms worsen or fail to improve. ? ?Bonnita Hollow, MD ? ? ?

## 2021-07-26 ENCOUNTER — Telehealth: Payer: Self-pay | Admitting: Gastroenterology

## 2021-07-26 NOTE — Telephone Encounter (Signed)
Patient's daughter called to make a follow up appointment for patient.  It is scheduled for 6/21.  She said patient was supposed to have bloodwork prior to the OV and wanted to know when she should take him to get it done.  Please call patient and advise.  Thank you. ?

## 2021-07-26 NOTE — Telephone Encounter (Signed)
Spoke with pt's daughter and let her know that the CBC and iron panel lab orders are in so pt can come anytime the week before his appt on 6/21 and he does not need an appt. Let pt's daughter know that the labs will be at our [place] office and gave her address. Pt's daughter verbalized understanding and had no other concerns at end of call.  ?

## 2021-07-30 DIAGNOSIS — R2681 Unsteadiness on feet: Secondary | ICD-10-CM | POA: Diagnosis not present

## 2021-07-30 DIAGNOSIS — E1140 Type 2 diabetes mellitus with diabetic neuropathy, unspecified: Secondary | ICD-10-CM | POA: Diagnosis not present

## 2021-07-31 DIAGNOSIS — L570 Actinic keratosis: Secondary | ICD-10-CM | POA: Diagnosis not present

## 2021-07-31 DIAGNOSIS — L578 Other skin changes due to chronic exposure to nonionizing radiation: Secondary | ICD-10-CM | POA: Diagnosis not present

## 2021-07-31 DIAGNOSIS — L309 Dermatitis, unspecified: Secondary | ICD-10-CM | POA: Diagnosis not present

## 2021-07-31 DIAGNOSIS — L821 Other seborrheic keratosis: Secondary | ICD-10-CM | POA: Diagnosis not present

## 2021-07-31 DIAGNOSIS — L723 Sebaceous cyst: Secondary | ICD-10-CM | POA: Diagnosis not present

## 2021-07-31 DIAGNOSIS — D044 Carcinoma in situ of skin of scalp and neck: Secondary | ICD-10-CM | POA: Diagnosis not present

## 2021-07-31 DIAGNOSIS — D485 Neoplasm of uncertain behavior of skin: Secondary | ICD-10-CM | POA: Diagnosis not present

## 2021-07-31 DIAGNOSIS — L97529 Non-pressure chronic ulcer of other part of left foot with unspecified severity: Secondary | ICD-10-CM | POA: Diagnosis not present

## 2021-08-02 ENCOUNTER — Encounter: Payer: Self-pay | Admitting: Podiatry

## 2021-08-02 ENCOUNTER — Ambulatory Visit: Payer: Medicare PPO | Admitting: Podiatry

## 2021-08-02 DIAGNOSIS — E1142 Type 2 diabetes mellitus with diabetic polyneuropathy: Secondary | ICD-10-CM

## 2021-08-02 DIAGNOSIS — R2681 Unsteadiness on feet: Secondary | ICD-10-CM | POA: Diagnosis not present

## 2021-08-02 DIAGNOSIS — E11621 Type 2 diabetes mellitus with foot ulcer: Secondary | ICD-10-CM

## 2021-08-02 DIAGNOSIS — L97522 Non-pressure chronic ulcer of other part of left foot with fat layer exposed: Secondary | ICD-10-CM

## 2021-08-02 DIAGNOSIS — E1140 Type 2 diabetes mellitus with diabetic neuropathy, unspecified: Secondary | ICD-10-CM | POA: Diagnosis not present

## 2021-08-02 NOTE — Progress Notes (Signed)
`  ?Subjective:  ?Patient ID: Shawn Blanchard, male    DOB: [birth year],   MRN: [id number] ? ?Chief Complaint  ?Patient presents with  ? Foot Ulcer  ?   ?Left foot ulcer  ? ? ?86 y.o. male presents for return of his previous ulcer.  Relate he has been active at the gym and in PT. Relates he has been wearing his old shoes in the gym which may have contributed to the area re-opening. He was seen by Dermatology yesterday and given mupirocin ointment to apply to wound. Does have his diabetic shoes on today. He is diabetic and his last A1c was 7.5. Denies any other pedal complaints. Denies n/v/f/c.  ? ?PCP: Arlester Marker MD  ? ?Past Medical History:  ?Diagnosis Date  ? Acute on chronic systolic congestive heart failure ([place]) 11/07/2017  ? 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 Arthritis 03/12/2016  ? Bilateral hand pain 03/25/2019  ? Blepharitis of both upper and lower eyelid 10/02/2018  ? Carpal tunnel syndrome of right wrist 03/25/2019  ? Chronic anticoagulation 10/11/2016  ? Chronic atrial fibrillation ([place]) 06/12/2015  ? Overview:  Managed CARDS  ? Chronic diastolic heart failure ([place]) 09/01/2015  ? Coronary artery disease involving native coronary artery of native heart with angina pectoris ([place]) 02/15/2015  ? Overview:  Hx of MI with stent to LAD 2002 MPS 2016 with EF 47% and no ischemia  ? Dermatochalasis of both upper eyelids 11/21/2016  ? Dermatochalasis of right upper eyelid 10/02/2018  ? Diabetic polyneuropathy associated with type 2 diabetes mellitus ([place]) 12/16/2018  ? 2020  ? Dyslipidemia 11/07/2017  ? Essential hypertension 11/07/2017  ? Ganglion cyst 11/27/2015  ? Overview:  2017: dorsum right hand  ? Gastroesophageal reflux disease without esophagitis 06/12/2015  ? Hyperlipidemia 04/11/2016  ? Hypertensive heart disease with heart failure ([employer]) 02/15/2015  ? Idiopathic chronic gout 05/17/2015  ? Insomnia 11/27/2015  ? Leukocytosis 04/11/2016  ? Lightheadedness 10/11/2016  ? Long term current use of oral hypoglycemic drug  10/02/2018  ? 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 Male erectile disorder 06/17/2016  ? Meibomian gland dysfunction (MGD) of both eyes 10/02/2018  ? MI (myocardial infarction) ([place]) 02/15/2015  ? Overview:  2002  ? Obesity 04/11/2016  ? 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 exacerbation ([place]) 06/12/2015  ? Obstructive sleep apnea 08/30/2015  ? Overview:  CPAP  ? Old MI (myocardial infarction)   ? Orthostatic dizziness 09/15/2017  ? 2018: med related  ? Permanent atrial fibrillation ([place]) 06/12/2015  ? Overview:  Managed CARDS  ? Pseudophakia of both eyes 03/12/2016  ? Pulmonary hypertension ([place]) 08/30/2015  ? Overview:  Managed CARDS  ? Purulent inflammation of skin 12/29/2018  ? Restless leg syndrome 08/30/2015  ? Restrictive lung disease 12/26/2016  ? Overview:  2018: mod on spirometry  ? SBO (small bowel obstruction) ([place]) 12/14/2019  ? Senile nuclear sclerosis 05/02/2016  ? Small bowel obstruction ([place]) 04/11/2016  ? SOB (shortness of breath) 08/14/2015  ? Type 2 diabetes mellitus with neurologic complication, without long-term current use of insulin ([place]) 12/02/2014  ? Unilateral inguinal hernia without obstruction or gangrene 06/12/2015  ? ? ?Objective:  ?Physical Exam: ?Vascular: DP/PT pulses 2/4 bilateral. CFT <3 seconds. Normal hair growth on digits. No edema.  ?Skin. Plantar first metatarsal wound with hyperkeratotic rim and granular base measuring 0.7 cm x 0.5 cm x 0.2 cm No erythema edema or purulence noted. . Nails 1-5 b/l are thickened elongated and with subungual debris.  ?  Musculoskeletal: MMT 5/5 bilateral lower extremities in DF, PF, Inversion and Eversion. Deceased ROM in DF of ankle joint.  ?Neurological: Sensation intact to light touch.  ? ?Assessment:  ? ?1. Diabetic ulcer of toe of left foot associated with type 2 diabetes mellitus, with fat layer exposed ([place])   ?2. Diabetic polyneuropathy associated with type 2 diabetes mellitus ([place])   ? ? ? ? ? ? ? ?Plan:  ?Patient was evaluated and treated and all  questions answered. ?Ulcer plantar left first metatarsal with fat layer exposed  ?-Debridement as below. ?-Dressed with betadine, DSD. Continue with daily mupirocin.  ?-Off-loading DM shoes and added padding around ulcer area.  ?-No abx indicated.  ?-Discussed glucose control and proper protein-rich diet.  ?-Discussed if any worsening redness, pain, fever or chills to call or may need to report to the emergency room. Patient expressed understanding.  ? ?Procedure: Excisional Debridement of Wound ?Rationale: Removal of non-viable soft tissue from the wound to promote healing.  ?Anesthesia: none ?Pre-Debridement Wound Measurements: Overylying callus  ?Post-Debridement Wound Measurements: 0.7 cm x 0.5 cm x 0.2 cm  ?Type of Debridement: Sharp Excisional ?Tissue Removed: Non-viable soft tissue ?Depth of Debridement: subcutaneous tissue. ?Technique: Sharp excisional debridement to bleeding, viable wound base.  ?Dressing: Dry, sterile, compression dressing. ?Disposition: Patient tolerated procedure well. Patient to return in 3 week for follow-up. ? ?Return in about 3 weeks (around 08/23/2021) for wound check. ? ? ? ?Return in about 3 weeks (around 08/23/2021) for wound check. ? ? ?Return in about 3 weeks (around 08/23/2021) for wound check. ? ? ?Lorenda Peck, DPM  ? ? ?

## 2021-08-06 DIAGNOSIS — E1140 Type 2 diabetes mellitus with diabetic neuropathy, unspecified: Secondary | ICD-10-CM | POA: Diagnosis not present

## 2021-08-06 DIAGNOSIS — R2681 Unsteadiness on feet: Secondary | ICD-10-CM | POA: Diagnosis not present

## 2021-08-07 ENCOUNTER — Other Ambulatory Visit: Payer: Self-pay | Admitting: Family Medicine

## 2021-08-08 ENCOUNTER — Encounter: Payer: Self-pay | Admitting: Family Medicine

## 2021-08-08 ENCOUNTER — Emergency Department (HOSPITAL_BASED_OUTPATIENT_CLINIC_OR_DEPARTMENT_OTHER): Payer: Medicare PPO

## 2021-08-08 ENCOUNTER — Inpatient Hospital Stay (HOSPITAL_BASED_OUTPATIENT_CLINIC_OR_DEPARTMENT_OTHER)
Admission: EM | Admit: 2021-08-08 | Discharge: 2021-08-12 | DRG: 622 | Disposition: A | Discharge status: Home Health | Payer: Medicare PPO | Attending: Internal Medicine | Admitting: Internal Medicine

## 2021-08-08 ENCOUNTER — Encounter (HOSPITAL_BASED_OUTPATIENT_CLINIC_OR_DEPARTMENT_OTHER): Payer: Self-pay | Admitting: Emergency Medicine

## 2021-08-08 ENCOUNTER — Other Ambulatory Visit: Payer: Self-pay

## 2021-08-08 ENCOUNTER — Inpatient Hospital Stay (HOSPITAL_COMMUNITY): Payer: Medicare PPO

## 2021-08-08 ENCOUNTER — Emergency Department (HOSPITAL_BASED_OUTPATIENT_CLINIC_OR_DEPARTMENT_OTHER): Payer: Medicare PPO | Admitting: Radiology

## 2021-08-08 DIAGNOSIS — I25119 Atherosclerotic heart disease of native coronary artery with unspecified angina pectoris: Secondary | ICD-10-CM | POA: Diagnosis present

## 2021-08-08 DIAGNOSIS — M899 Disorder of bone, unspecified: Secondary | ICD-10-CM | POA: Diagnosis not present

## 2021-08-08 DIAGNOSIS — S91302A Unspecified open wound, left foot, initial encounter: Secondary | ICD-10-CM | POA: Diagnosis not present

## 2021-08-08 DIAGNOSIS — M109 Gout, unspecified: Secondary | ICD-10-CM

## 2021-08-08 DIAGNOSIS — E11621 Type 2 diabetes mellitus with foot ulcer: Secondary | ICD-10-CM | POA: Diagnosis not present

## 2021-08-08 DIAGNOSIS — L02612 Cutaneous abscess of left foot: Secondary | ICD-10-CM | POA: Diagnosis present

## 2021-08-08 DIAGNOSIS — J449 Chronic obstructive pulmonary disease, unspecified: Secondary | ICD-10-CM | POA: Diagnosis present

## 2021-08-08 DIAGNOSIS — N1831 Chronic kidney disease, stage 3a: Secondary | ICD-10-CM | POA: Diagnosis present

## 2021-08-08 DIAGNOSIS — E1122 Type 2 diabetes mellitus with diabetic chronic kidney disease: Secondary | ICD-10-CM | POA: Diagnosis present

## 2021-08-08 DIAGNOSIS — I5023 Acute on chronic systolic (congestive) heart failure: Secondary | ICD-10-CM | POA: Diagnosis present

## 2021-08-08 DIAGNOSIS — Z7984 Long term (current) use of oral hypoglycemic drugs: Secondary | ICD-10-CM

## 2021-08-08 DIAGNOSIS — G4730 Sleep apnea, unspecified: Secondary | ICD-10-CM | POA: Diagnosis present

## 2021-08-08 DIAGNOSIS — E669 Obesity, unspecified: Secondary | ICD-10-CM

## 2021-08-08 DIAGNOSIS — L03116 Cellulitis of left lower limb: Secondary | ICD-10-CM | POA: Diagnosis not present

## 2021-08-08 DIAGNOSIS — Z20822 Contact with and (suspected) exposure to covid-19: Secondary | ICD-10-CM | POA: Diagnosis not present

## 2021-08-08 DIAGNOSIS — Z823 Family history of stroke: Secondary | ICD-10-CM

## 2021-08-08 DIAGNOSIS — E785 Hyperlipidemia, unspecified: Secondary | ICD-10-CM | POA: Diagnosis present

## 2021-08-08 DIAGNOSIS — L089 Local infection of the skin and subcutaneous tissue, unspecified: Secondary | ICD-10-CM | POA: Diagnosis not present

## 2021-08-08 DIAGNOSIS — M10472 Other secondary gout, left ankle and foot: Secondary | ICD-10-CM | POA: Diagnosis not present

## 2021-08-08 DIAGNOSIS — R0602 Shortness of breath: Secondary | ICD-10-CM | POA: Diagnosis not present

## 2021-08-08 DIAGNOSIS — Z955 Presence of coronary angioplasty implant and graft: Secondary | ICD-10-CM | POA: Diagnosis not present

## 2021-08-08 DIAGNOSIS — M10072 Idiopathic gout, left ankle and foot: Secondary | ICD-10-CM | POA: Diagnosis not present

## 2021-08-08 DIAGNOSIS — I700 Atherosclerosis of aorta: Secondary | ICD-10-CM | POA: Diagnosis not present

## 2021-08-08 DIAGNOSIS — I252 Old myocardial infarction: Secondary | ICD-10-CM

## 2021-08-08 DIAGNOSIS — I4821 Permanent atrial fibrillation: Secondary | ICD-10-CM | POA: Diagnosis present

## 2021-08-08 DIAGNOSIS — L97522 Non-pressure chronic ulcer of other part of left foot with fat layer exposed: Secondary | ICD-10-CM | POA: Diagnosis not present

## 2021-08-08 DIAGNOSIS — I482 Chronic atrial fibrillation, unspecified: Secondary | ICD-10-CM | POA: Diagnosis not present

## 2021-08-08 DIAGNOSIS — Z7901 Long term (current) use of anticoagulants: Secondary | ICD-10-CM

## 2021-08-08 DIAGNOSIS — Z96653 Presence of artificial knee joint, bilateral: Secondary | ICD-10-CM | POA: Diagnosis present

## 2021-08-08 DIAGNOSIS — I509 Heart failure, unspecified: Secondary | ICD-10-CM

## 2021-08-08 DIAGNOSIS — L02416 Cutaneous abscess of left lower limb: Secondary | ICD-10-CM | POA: Diagnosis not present

## 2021-08-08 DIAGNOSIS — L97529 Non-pressure chronic ulcer of other part of left foot with unspecified severity: Secondary | ICD-10-CM | POA: Diagnosis present

## 2021-08-08 DIAGNOSIS — E11628 Type 2 diabetes mellitus with other skin complications: Secondary | ICD-10-CM | POA: Diagnosis not present

## 2021-08-08 DIAGNOSIS — E1165 Type 2 diabetes mellitus with hyperglycemia: Secondary | ICD-10-CM | POA: Diagnosis not present

## 2021-08-08 DIAGNOSIS — E6609 Other obesity due to excess calories: Secondary | ICD-10-CM | POA: Diagnosis present

## 2021-08-08 DIAGNOSIS — I130 Hypertensive heart and chronic kidney disease with heart failure and stage 1 through stage 4 chronic kidney disease, or unspecified chronic kidney disease: Secondary | ICD-10-CM | POA: Diagnosis present

## 2021-08-08 DIAGNOSIS — M1A0720 Idiopathic chronic gout, left ankle and foot, without tophus (tophi): Secondary | ICD-10-CM | POA: Diagnosis present

## 2021-08-08 DIAGNOSIS — J9811 Atelectasis: Secondary | ICD-10-CM | POA: Diagnosis not present

## 2021-08-08 DIAGNOSIS — I10 Essential (primary) hypertension: Secondary | ICD-10-CM | POA: Diagnosis present

## 2021-08-08 DIAGNOSIS — E119 Type 2 diabetes mellitus without complications: Secondary | ICD-10-CM | POA: Diagnosis not present

## 2021-08-08 DIAGNOSIS — E1142 Type 2 diabetes mellitus with diabetic polyneuropathy: Secondary | ICD-10-CM | POA: Diagnosis present

## 2021-08-08 DIAGNOSIS — M79672 Pain in left foot: Secondary | ICD-10-CM | POA: Diagnosis not present

## 2021-08-08 DIAGNOSIS — Z6834 Body mass index (BMI) 34.0-34.9, adult: Secondary | ICD-10-CM | POA: Diagnosis not present

## 2021-08-08 DIAGNOSIS — Z833 Family history of diabetes mellitus: Secondary | ICD-10-CM

## 2021-08-08 DIAGNOSIS — E1140 Type 2 diabetes mellitus with diabetic neuropathy, unspecified: Secondary | ICD-10-CM | POA: Diagnosis not present

## 2021-08-08 DIAGNOSIS — Z79899 Other long term (current) drug therapy: Secondary | ICD-10-CM

## 2021-08-08 DIAGNOSIS — E1149 Type 2 diabetes mellitus with other diabetic neurological complication: Secondary | ICD-10-CM | POA: Diagnosis present

## 2021-08-08 DIAGNOSIS — E66811 Obesity, class 1: Secondary | ICD-10-CM

## 2021-08-08 DIAGNOSIS — I272 Pulmonary hypertension, unspecified: Secondary | ICD-10-CM | POA: Diagnosis present

## 2021-08-08 DIAGNOSIS — R062 Wheezing: Secondary | ICD-10-CM | POA: Diagnosis not present

## 2021-08-08 DIAGNOSIS — Z8249 Family history of ischemic heart disease and other diseases of the circulatory system: Secondary | ICD-10-CM

## 2021-08-08 DIAGNOSIS — Z85068 Personal history of other malignant neoplasm of small intestine: Secondary | ICD-10-CM

## 2021-08-08 DIAGNOSIS — I2510 Atherosclerotic heart disease of native coronary artery without angina pectoris: Secondary | ICD-10-CM | POA: Diagnosis not present

## 2021-08-08 DIAGNOSIS — M25475 Effusion, left foot: Secondary | ICD-10-CM | POA: Diagnosis not present

## 2021-08-08 HISTORY — DX: Obesity, unspecified: E66.9

## 2021-08-08 HISTORY — DX: Type 2 diabetes mellitus with other skin complications: E11.628

## 2021-08-08 HISTORY — DX: Obesity, class 1: E66.811

## 2021-08-08 LAB — URINALYSIS, ROUTINE W REFLEX MICROSCOPIC
Bilirubin Urine: NEGATIVE
Glucose, UA: NEGATIVE mg/dL
Hgb urine dipstick: NEGATIVE
Ketones, ur: NEGATIVE mg/dL
Nitrite: NEGATIVE
Specific Gravity, Urine: 1.018 (ref 1.005–1.030)
pH: 5.5 (ref 5.0–8.0)

## 2021-08-08 LAB — COMPREHENSIVE METABOLIC PANEL
ALT: 12 U/L (ref 0–44)
AST: 15 U/L (ref 15–41)
Albumin: 4.1 g/dL (ref 3.5–5.0)
Alkaline Phosphatase: 50 U/L (ref 38–126)
Anion gap: 10 (ref 5–15)
BUN: 24 mg/dL — ABNORMAL HIGH (ref 8–23)
CO2: 27 mmol/L (ref 22–32)
Calcium: 9.2 mg/dL (ref 8.9–10.3)
Chloride: 100 mmol/L (ref 98–111)
Creatinine, Ser: 1.31 mg/dL — ABNORMAL HIGH (ref 0.61–1.24)
GFR, Estimated: 54 mL/min — ABNORMAL LOW (ref >60)
Glucose, Bld: 142 mg/dL — ABNORMAL HIGH (ref 70–99)
Potassium: 3.7 mmol/L (ref 3.5–5.1)
Sodium: 137 mmol/L (ref 135–145)
Total Bilirubin: 1.5 mg/dL — ABNORMAL HIGH (ref 0.3–1.2)
Total Protein: 7.1 g/dL (ref 6.5–8.1)

## 2021-08-08 LAB — PREALBUMIN: Prealbumin: 9.2 mg/dL — ABNORMAL LOW (ref 18–38)

## 2021-08-08 LAB — PHOSPHORUS: Phosphorus: 2.8 mg/dL (ref 2.5–4.6)

## 2021-08-08 LAB — CBC WITH DIFFERENTIAL/PLATELET
Abs Immature Granulocytes: 0.07 10*3/uL (ref 0.00–0.07)
Basophils Absolute: 0 10*3/uL (ref 0.0–0.1)
Basophils Relative: 0 %
Eosinophils Absolute: 0.1 10*3/uL (ref 0.0–0.5)
Eosinophils Relative: 1 %
HCT: 35.7 % — ABNORMAL LOW (ref 39.0–52.0)
Hemoglobin: 11.3 g/dL — ABNORMAL LOW (ref 13.0–17.0)
Immature Granulocytes: 1 %
Lymphocytes Relative: 6 %
Lymphs Abs: 0.9 10*3/uL (ref 0.7–4.0)
MCH: 26.6 pg (ref 26.0–34.0)
MCHC: 31.7 g/dL (ref 30.0–36.0)
MCV: 84 fL (ref 80.0–100.0)
Monocytes Absolute: 1 10*3/uL (ref 0.1–1.0)
Monocytes Relative: 7 %
Neutro Abs: 12.9 10*3/uL — ABNORMAL HIGH (ref 1.7–7.7)
Neutrophils Relative %: 85 %
Platelets: 177 10*3/uL (ref 150–400)
RBC: 4.25 MIL/uL (ref 4.22–5.81)
RDW: 16.5 % — ABNORMAL HIGH (ref 11.5–15.5)
WBC: 15 10*3/uL — ABNORMAL HIGH (ref 4.0–10.5)
nRBC: 0 % (ref 0.0–0.2)

## 2021-08-08 LAB — RESP PANEL BY RT-PCR (FLU A&B, COVID) ARPGX2
Influenza A by PCR: NEGATIVE
Influenza B by PCR: NEGATIVE
SARS Coronavirus 2 by RT PCR: NEGATIVE

## 2021-08-08 LAB — GLUCOSE, CAPILLARY
Glucose-Capillary: 120 mg/dL — ABNORMAL HIGH (ref 70–99)
Glucose-Capillary: 180 mg/dL — ABNORMAL HIGH (ref 70–99)

## 2021-08-08 LAB — CBG MONITORING, ED: Glucose-Capillary: 134 mg/dL — ABNORMAL HIGH (ref 70–99)

## 2021-08-08 LAB — TROPONIN I (HIGH SENSITIVITY)
Troponin I (High Sensitivity): 14 ng/L (ref <18)
Troponin I (High Sensitivity): 14 ng/L (ref <18)

## 2021-08-08 LAB — LACTIC ACID, PLASMA
Lactic Acid, Venous: 1.3 mmol/L (ref 0.5–1.9)
Lactic Acid, Venous: 1.2 mmol/L (ref 0.5–1.9)

## 2021-08-08 LAB — BRAIN NATRIURETIC PEPTIDE: B Natriuretic Peptide: 334.5 pg/mL — ABNORMAL HIGH (ref 0.0–100.0)

## 2021-08-08 LAB — SEDIMENTATION RATE: Sed Rate: 35 mm/hr — ABNORMAL HIGH (ref 0–16)

## 2021-08-08 LAB — MAGNESIUM: Magnesium: 2.2 mg/dL (ref 1.7–2.4)

## 2021-08-08 LAB — C-REACTIVE PROTEIN: CRP: 16.6 mg/dL — ABNORMAL HIGH (ref <1.0)

## 2021-08-08 MED ORDER — FERROUS SULFATE 325 (65 FE) MG PO TABS
325.0000 mg | ORAL_TABLET | Freq: Two times a day (BID) | ORAL | Status: DC
  Administered 2021-08-08 – 2021-08-12 (×8): 325 mg via ORAL
  Filled 2021-08-08 (×8): qty 1

## 2021-08-08 MED ORDER — FUROSEMIDE 10 MG/ML IJ SOLN
40.0000 mg | Freq: Once | INTRAMUSCULAR | Status: AC
  Administered 2021-08-08: 40 mg via INTRAVENOUS
  Filled 2021-08-08: qty 4

## 2021-08-08 MED ORDER — POLYETHYLENE GLYCOL 3350 17 G PO PACK
17.0000 g | PACK | Freq: Every day | ORAL | Status: DC | PRN
  Administered 2021-08-10: 17 g via ORAL
  Filled 2021-08-08: qty 1

## 2021-08-08 MED ORDER — POTASSIUM CHLORIDE CRYS ER 20 MEQ PO TBCR
20.0000 meq | EXTENDED_RELEASE_TABLET | Freq: Once | ORAL | Status: AC
  Administered 2021-08-08: 20 meq via ORAL
  Filled 2021-08-08: qty 1

## 2021-08-08 MED ORDER — METRONIDAZOLE 500 MG PO TABS
500.0000 mg | ORAL_TABLET | Freq: Two times a day (BID) | ORAL | Status: DC
  Administered 2021-08-08 – 2021-08-12 (×9): 500 mg via ORAL
  Filled 2021-08-08 (×9): qty 1

## 2021-08-08 MED ORDER — LINAGLIPTIN 5 MG PO TABS
5.0000 mg | ORAL_TABLET | Freq: Every day | ORAL | Status: DC
Start: 2021-08-09 — End: 2021-08-12
  Administered 2021-08-09 – 2021-08-12 (×4): 5 mg via ORAL
  Filled 2021-08-08 (×4): qty 1

## 2021-08-08 MED ORDER — NITROGLYCERIN 0.4 MG SL SUBL: 0.4000 mg | SUBLINGUAL_TABLET | SUBLINGUAL | Status: DC | PRN

## 2021-08-08 MED ORDER — ACETAMINOPHEN 325 MG PO TABS: 650.0000 mg | ORAL_TABLET | Freq: Four times a day (QID) | ORAL | Status: DC | PRN

## 2021-08-08 MED ORDER — OXYCODONE HCL 5 MG PO TABS: 5.0000 mg | ORAL_TABLET | Freq: Four times a day (QID) | ORAL | Status: DC | PRN

## 2021-08-08 MED ORDER — ONDANSETRON HCL 4 MG PO TABS: 4.0000 mg | ORAL_TABLET | Freq: Four times a day (QID) | ORAL | Status: DC | PRN

## 2021-08-08 MED ORDER — INSULIN ASPART 100 UNIT/ML IJ SOLN
0.0000 [IU] | Freq: Three times a day (TID) | INTRAMUSCULAR | Status: DC
  Administered 2021-08-08: 1 [IU] via SUBCUTANEOUS
  Administered 2021-08-09: 2 [IU] via SUBCUTANEOUS
  Administered 2021-08-09 – 2021-08-10 (×3): 1 [IU] via SUBCUTANEOUS
  Administered 2021-08-10: 2 [IU] via SUBCUTANEOUS

## 2021-08-08 MED ORDER — EZETIMIBE 10 MG PO TABS
10.0000 mg | ORAL_TABLET | Freq: Every day | ORAL | Status: DC
  Administered 2021-08-08 – 2021-08-12 (×5): 10 mg via ORAL
  Filled 2021-08-08 (×5): qty 1

## 2021-08-08 MED ORDER — METFORMIN HCL ER 750 MG PO TB24
750.0000 mg | ORAL_TABLET | Freq: Two times a day (BID) | ORAL | Status: DC
  Administered 2021-08-08 – 2021-08-09 (×2): 750 mg via ORAL
  Filled 2021-08-08 (×2): qty 1

## 2021-08-08 MED ORDER — MELATONIN 5 MG PO TABS
5.0000 mg | ORAL_TABLET | Freq: Every day | ORAL | Status: DC
  Administered 2021-08-08 – 2021-08-11 (×4): 5 mg via ORAL
  Filled 2021-08-08 (×4): qty 1

## 2021-08-08 MED ORDER — CARVEDILOL 3.125 MG PO TABS
3.1250 mg | ORAL_TABLET | Freq: Two times a day (BID) | ORAL | Status: DC
  Administered 2021-08-08 – 2021-08-12 (×7): 3.125 mg via ORAL
  Filled 2021-08-08 (×7): qty 1

## 2021-08-08 MED ORDER — SODIUM CHLORIDE 0.9 % IV SOLN
2.0000 g | INTRAVENOUS | Status: DC
  Administered 2021-08-08: 2 g via INTRAVENOUS
  Filled 2021-08-08 (×2): qty 20

## 2021-08-08 MED ORDER — FUROSEMIDE 20 MG PO TABS
20.0000 mg | ORAL_TABLET | Freq: Two times a day (BID) | ORAL | Status: DC
  Administered 2021-08-08 – 2021-08-10 (×5): 20 mg via ORAL
  Filled 2021-08-08 (×5): qty 1

## 2021-08-08 MED ORDER — ONDANSETRON HCL 4 MG/2ML IJ SOLN: 4.0000 mg | Freq: Four times a day (QID) | INTRAMUSCULAR | Status: DC | PRN

## 2021-08-08 MED ORDER — ACETAMINOPHEN 650 MG RE SUPP
650.0000 mg | Freq: Four times a day (QID) | RECTAL | Status: DC | PRN
Start: 2021-08-08 — End: 2021-08-12

## 2021-08-08 MED ORDER — POLYVINYL ALCOHOL 1.4 % OP SOLN
1.0000 [drp] | OPHTHALMIC | Status: DC | PRN
  Filled 2021-08-08: qty 15

## 2021-08-08 MED ORDER — GADOBUTROL 1 MMOL/ML IV SOLN
10.0000 mL | Freq: Once | INTRAVENOUS | Status: AC | PRN
  Administered 2021-08-08: 10 mL via INTRAVENOUS

## 2021-08-08 NOTE — ED Triage Notes (Signed)
Pt. Fell last night at approximately 0030 on his way to the bathroom. Pt states he fell onto his left knee. Pt states he thinks he may have passed out and is not sure. Pt states he did not hit head. Pt is on Thinners.  ? ?Pt states that he is also has SOB with exertion. ? ?Pt complains of pain to his left related to a diabetic sore recently treated by the podiatrist.  ?

## 2021-08-08 NOTE — ED Notes (Signed)
Handoff report given to [facility] 

## 2021-08-08 NOTE — ED Notes (Signed)
Handoff report given to Joellen Jersey RN on 6E at [facility] ?

## 2021-08-08 NOTE — H&P (Signed)
?History and Physical  ? ? ?Patient: Shawn Blanchard GEX:[id number] DOB: 07-29-1935 ?DOA: 08/08/2021 ?DOS: the patient was seen and examined on 08/08/2021 ?PCP: Haydee Salter, MD  ?Patient coming from: Home ? ?Chief Complaint:  ?Chief Complaint  ?Patient presents with  ? Shortness of Breath  ? ?HPI: Shawn Blanchard is a 86 y.o. male with medical history significant of chronic systolic heart failure, adenocarcinoma small bowel, lymphoma small intestine, history of multiple SBO episodes, bilateral hand pain, blepharitis, may meibomian gland dysfunction of both eyes, carpal tunnel syndrome, chronic atrial fibrillation on Pradaxa, CAD, diabetic polyneuropathy, hyperlipidemia, GERD, idiopathic chronic gout, insomnia, leukocytosis, unspecified lightheadedness, pulmonary hypertension, restless leg syndrome, restrictive lung disease, inguinal hernia, type 2 diabetes who presented to the emergency department due to his wife and daughter prompting him to do so due to having a fall in the bathroom very early morning, requiring EMS to help him get up, but did not go to the emergency department and return to sleep.  However, this morning relatives prompted him to be seen at the ED due to recent dyspnea associated with lower extremity edema and also endorses occasional nonproductive cough, but no PND, orthopnea, chest pain, diaphoresis or palpitations.  He gets frequent postural dizziness.  He frequently adds salt to his meals.  While he was in the ER, it was also noted that the patient has had a substantial change on his left foot callus now with surrounding erythema, edema, and calor that surrounds an area with a purulent collection which is tender to palpation.  He has been following with podiatry regularly.  There is no fever, chills, headache, rhinorrhea, sore throat, abdominal pain, recent diarrhea/constipation, melena or hematochezia.  No flank pain, dysuria or hematuria, but stated he has been having trouble  initiating urination recently.  No polyuria, polydipsia, polyphagia or blurred vision. ? ?ED course: Initial vital signs were temperature 99 ?F, pulse 82, respirations 23, BP 128/91 mmHg and O2 sat 100% on room air.  The patient received furosemide 40 mg IVP x1 and KCl 20 mEq p.o. x1. ? ?Lab work: Urinalysis with trace proteinuria and trace leukocyte esterase, but otherwise unremarkable.  CBC showed a white count of 15.0, hemoglobin 11.3 g/dL platelets 177.  BNP 334.5 pg/mL.  Troponin was 14 ng/L x 2.  Lactic acid was normal.  CMP shows a glucose of 142, BUN 24, creatinine 1.31 and total bilirubin 1.5 mg/dL.  The rest of the CMP measurements were normal. ? ?Imaging: Left foot x-ray did not show any evidence of acute osteomyelitis.  Portable 1 view chest radiograph showed cardiomegaly and minimal right basilar atelectasis. ? ?Review of Systems: As mentioned in the history of present illness. All other systems reviewed and are negative. ?Past Medical History:  ?Diagnosis Date  ? Acute on chronic systolic congestive heart failure ([place]) 11/07/2017  ? 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 Arthritis 03/12/2016  ? Bilateral hand pain 03/25/2019  ? Blepharitis of both upper and lower eyelid 10/02/2018  ? Carpal tunnel syndrome of right wrist 03/25/2019  ? Chronic anticoagulation 10/11/2016  ? Chronic atrial fibrillation ([place]) 06/12/2015  ? Overview:  Managed CARDS  ? Chronic diastolic heart failure ([place]) 09/01/2015  ? Coronary artery disease involving native coronary artery of native heart with angina pectoris ([place]) 02/15/2015  ? Overview:  Hx of MI with stent to LAD 2002 MPS 2016 with EF 47% and no ischemia  ? Dermatochalasis of both upper eyelids 11/21/2016  ? Dermatochalasis of right upper  eyelid 10/02/2018  ? Diabetic polyneuropathy associated with type 2 diabetes mellitus ([place]) 12/16/2018  ? 2020  ? Dyslipidemia 11/07/2017  ? Essential hypertension 11/07/2017  ? Ganglion cyst 11/27/2015  ? Overview:  2017: dorsum right hand  ?  Gastroesophageal reflux disease without esophagitis 06/12/2015  ? Hyperlipidemia 04/11/2016  ? Hypertensive heart disease with heart failure ([place]) 02/15/2015  ? Idiopathic chronic gout 05/17/2015  ? Insomnia 11/27/2015  ? Leukocytosis 04/11/2016  ? Lightheadedness 10/11/2016  ? Long term current use of oral hypoglycemic drug 10/02/2018  ? 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 Male erectile disorder 06/17/2016  ? Meibomian gland dysfunction (MGD) of both eyes 10/02/2018  ? MI (myocardial infarction) ([place]) 02/15/2015  ? Overview:  2002  ? Obesity 04/11/2016  ? 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 exacerbation ([place]) 06/12/2015  ? Obstructive sleep apnea 08/30/2015  ? Overview:  CPAP  ? Old MI (myocardial infarction)   ? Orthostatic dizziness 09/15/2017  ? 2018: med related  ? Permanent atrial fibrillation ([place]) 06/12/2015  ? Overview:  Managed CARDS  ? Pseudophakia of both eyes 03/12/2016  ? Pulmonary hypertension ([place]) 08/30/2015  ? Overview:  Managed CARDS  ? Purulent inflammation of skin 12/29/2018  ? Restless leg syndrome 08/30/2015  ? Restrictive lung disease 12/26/2016  ? Overview:  2018: mod on spirometry  ? SBO (small bowel obstruction) ([place]) 12/14/2019  ? Senile nuclear sclerosis 05/02/2016  ? Small bowel obstruction ([place]) 04/11/2016  ? SOB (shortness of breath) 08/14/2015  ? Type 2 diabetes mellitus with neurologic complication, without long-term current use of insulin ([place]) 12/02/2014  ? Unilateral inguinal hernia without obstruction or gangrene 06/12/2015  ? ?Past Surgical History:  ?Procedure Laterality Date  ? ABDOMINAL ADHESION SURGERY    ? x 2  ? BLEPHAROPLASTY Bilateral   ? CARPAL TUNNEL RELEASE Right 2021  ? CATARACT EXTRACTION Bilateral   ? CERVICAL SPINE SURGERY    ? CHOLECYSTECTOMY    ? CORONARY ANGIOPLASTY WITH STENT PLACEMENT  2000  ? NASAL SINUS SURGERY    ? ORIF TIBIAL SHAFT FRACTURE W/ PLATES AND SCREWS Left   ? PARTIAL KNEE ARTHROPLASTY Bilateral   ? SMALL INTESTINE SURGERY    ? Adenocarcinoma  ? TONSILLECTOMY     ? ?Social History:  reports that he has never smoked. He has never used smokeless tobacco. He reports that he does not currently use alcohol. He reports that he does not use drugs. ? ?Allergies  ?Allergen Reactions  ? Atorvastatin Other (See Comments)  ?  Dizziness.  ? Jardiance [Empagliflozin] Other (See Comments)  ?  Orthostasis  ? Ace Inhibitors Rash  ? Amoxicillin Rash  ? Meperidine Rash  ? Meperidine Hcl Rash  ? Naproxen Sodium Rash  ? Sulfa Antibiotics Rash  ? Sulfamethoxazole Rash  ? Tetracycline Rash  ? ? ?Family History  ?Problem Relation Age of Onset  ? Cancer Mother   ?     Ovarian  ? Diabetes Mother   ? Heart disease Mother   ? Heart disease Father   ? Stroke Father   ? Diabetes Father   ? Diabetes Sister   ? Diabetes Brother   ? Diabetes Brother   ? Cancer Paternal Grandfather   ?     Prostate  ? Colon cancer Neg Hx   ? Rectal cancer Neg Hx   ? Pancreatic cancer Neg Hx   ? Liver cancer Neg Hx   ? Stomach cancer Neg Hx   ? ? ?Prior to Admission medications   ?  Medication Sig Start Date End Date Taking? Authorizing Provider  ?Alpha-Lipoic Acid 600 MG TABS Take 600 mg by mouth in the morning and at bedtime.   Yes [provider]  ?b complex vitamins capsule Take 1 capsule by mouth daily.   Yes [provider]  ?carvedilol (COREG) 3.125 MG tablet TAKE 1 TABLET BY MOUTH 2 TIMES A DAY WITH A MEAL ?Patient taking differently: Take 3.125 mg by mouth 2 (two) times daily with a meal. 09/13/20  Yes Tobb, Kardie, DO  ?dabigatran (PRADAXA) 150 MG CAPS capsule TAKE 1 CAPSULE BY MOUTH 2 TIMES DAILY ?Patient taking differently: Take 150 mg by mouth 2 (two) times daily. 02/12/21  Yes Richardo Priest, MD  ?ezetimibe (ZETIA) 10 MG tablet Take 10 mg by mouth daily. 04/06/20  Yes [provider]  ?FEROSUL 325 (65 Fe) MG tablet TAKE 1 TABLET BY MOUTH 2 TIMES A DAY 07/02/21  Yes Haydee Salter, MD  ?furosemide (LASIX) 20 MG tablet TAKE 1 TABLET BY MOUTH 2 TIMES A DAY ?Patient taking differently: Take  20 mg by mouth 2 (two) times daily. 03/05/21  Yes Richardo Priest, MD  ?hydrocortisone 2.5 % ointment Apply 1 application topically 2 (two) times daily as needed (Skin Irritation). 11/29/20  Yes Provider, Historical,

## 2021-08-08 NOTE — ED Provider Notes (Signed)
?[facility] EMERGENCY DEPT ?Provider Note ? ? ?CSN: [id number] ?Arrival date & time: 08/08/21  0857 ? ?  ? ?History ? ?Chief Complaint  ?Patient presents with  ? Shortness of Breath  ? ? ?Shawn Blanchard is a 86 y.o. male.  He is brought in by his daughter for evaluation of increased shortness of breath over the last few weeks.  Dyspnea with exertion.  Generalized weakness.  Had a fall last evening unclear why.  Also has a poorly healing wound on left plantar surface first metatarsal head.  Saw podiatry about a week ago and they debrided the area.  For the last few days its been more swollen painful and red.  No fever. ? ?The history is provided by the patient and a relative.  ?Shortness of Breath ?Severity:  Moderate ?Onset quality:  Gradual ?Duration:  2 weeks ?Timing:  Intermittent ?Progression:  Worsening ?Chronicity:  Recurrent ?Context: activity   ?Relieved by:  Nothing ?Worsened by:  Activity ?Ineffective treatments:  Rest ?Associated symptoms: chest pain and neck pain   ?Associated symptoms: no abdominal pain, no cough, no diaphoresis, no fever, no headaches, no sputum production, no vomiting and no wheezing   ?Risk factors: no tobacco use   ? ?  ? ?Home Medications ?Prior to Admission medications   ?Medication Sig Start Date End Date Taking? Authorizing Provider  ?Alpha-Lipoic Acid 600 MG TABS Take 600 mg by mouth in the morning and at bedtime.    [provider]  ?b complex vitamins capsule Take 1 capsule by mouth daily.    [provider]  ?carvedilol (COREG) 3.125 MG tablet TAKE 1 TABLET BY MOUTH 2 TIMES A DAY WITH A MEAL ?Patient taking differently: Take 3.125 mg by mouth 2 (two) times daily with a meal. 09/13/20   Tobb, Kardie, DO  ?dabigatran (PRADAXA) 150 MG CAPS capsule TAKE 1 CAPSULE BY MOUTH 2 TIMES DAILY ?Patient taking differently: Take 150 mg by mouth 2 (two) times daily. 02/12/21   Richardo Priest, MD  ?ezetimibe (ZETIA) 10 MG tablet Take 10 mg by mouth daily.  04/06/20   [provider]  ?FEROSUL 325 (65 Fe) MG tablet TAKE 1 TABLET BY MOUTH 2 TIMES A DAY 07/02/21   Haydee Salter, MD  ?furosemide (LASIX) 20 MG tablet TAKE 1 TABLET BY MOUTH 2 TIMES A DAY ?Patient taking differently: Take 20 mg by mouth 2 (two) times daily. 03/05/21   Richardo Priest, MD  ?glucose blood test strip OneTouch Ultra Blue Test Strip    [provider]  ?hydrocortisone 2.5 % ointment Apply 1 application topically 2 (two) times daily as needed (Skin Irritation). 11/29/20   [provider]  ?JANUMET XR [phone] MG TB24 TAKE 1 TABLET BY MOUTH EVERY DAY WITH BREAKFAST (FOR DIABETES) 08/07/21   Haydee Salter, MD  ?Lancets [facility] DELICA PLUS [id number]) [place] OneTouch Delica Plus Lancet 30 gauge    [provider]  ?meclizine (ANTIVERT) 12.5 MG tablet Take 1 tablet (12.5 mg total) by mouth 3 (three) times daily as needed for dizziness. 07/24/21   Bonnita Hollow, MD  ?Melatonin 5 MG CAPS Take 5 mg by mouth at bedtime.    [provider]  ?metFORMIN (GLUCOPHAGE-XR) 500 MG 24 hr tablet Take 1 tablet (500 mg total) by mouth daily in the afternoon. 03/29/21   Haydee Salter, MD  ?Multiple Vitamin (MULTI-VITAMINS PO) Take 6 tablets by mouth daily. Balance of nature ?3 green ?3 red    [provider]  ?nitroGLYCERIN (NITROSTAT) 0.4 MG SL tablet PLACE 1 TABLET UNDER TONGUE EVERY 5 MINUTES AS NEEDED FOR CHEST PAIN (UP TO 3 DOSES) ?Patient taking differently: Place 0.4 mg under the tongue every 5 (five) minutes as needed for chest pain. 02/02/20   Richardo Priest, MD  ?polyethylene glycol (MIRALAX) 17 g packet Take 17 g by mouth daily as needed. 05/22/21   Kayleen Memos, DO  ?polyvinyl alcohol (LIQUIFILM TEARS) 1.4 % ophthalmic solution Place 1 drop into both eyes as needed for dry eyes.    [provider]  ?REPATHA SURECLICK 272 MG/ML SOAJ Inject 140 mg into the skin every 14 (fourteen) days. 01/01/21   Richardo Priest, MD  ?Turmeric 400 MG CAPS  Take 400 mg by mouth daily.    [provider]  ?   ? ?Allergies    ?Atorvastatin, Jardiance [empagliflozin], Ace inhibitors, Amoxicillin, Meperidine, Meperidine hcl, Naproxen sodium, Sulfa antibiotics, Sulfamethoxazole, and Tetracycline   ? ?Review of Systems   ?Review of Systems  ?Constitutional:  Positive for fatigue. Negative for diaphoresis and fever.  ?Eyes:  Negative for visual disturbance.  ?Respiratory:  Positive for shortness of breath. Negative for cough, sputum production and wheezing.   ?Cardiovascular:  Positive for chest pain.  ?Gastrointestinal:  Negative for abdominal pain and vomiting.  ?Genitourinary:  Negative for dysuria.  ?Musculoskeletal:  Positive for neck pain.  ?Skin:  Positive for wound.  ?Neurological:  Negative for headaches.  ? ?Physical Exam ?Updated Vital Signs ?BP (!) 128/91 (BP Location: Right Arm)   Pulse 82   Temp 99 ?F (37.2 ?C) (Oral)   Resp (!) 23   Ht '5\' 11"'$  (1.803 m)   Wt 113.4 kg   SpO2 100%   BMI 34.87 kg/m?  ?Physical Exam ?Vitals and nursing note reviewed.  ?Constitutional:   ?   General: He is not in acute distress. ?   Appearance: He is well-developed. He is obese.  ?HENT:  ?   Head: Normocephalic and atraumatic.  ?Eyes:  ?   Conjunctiva/sclera: Conjunctivae normal.  ?Cardiovascular:  ?   Rate and Rhythm: Normal rate. Rhythm irregular.  ?   Heart sounds: No murmur heard. ?Pulmonary:  ?   Effort: Tachypnea and accessory muscle usage present. No respiratory distress.  ?   Breath sounds: Normal breath sounds.  ?Abdominal:  ?   Palpations: Abdomen is soft.  ?   Tenderness: There is no abdominal tenderness.  ?Musculoskeletal:     ?   General: No swelling.  ?   Cervical back: Neck supple.  ?   Right lower leg: Edema present.  ?   Left lower leg: Edema present.  ?   Comments: He has a proximately 2 cm circular wound in his first metatarsal head on his left foot.  There is surrounding swelling and some erythema.  Picture below.  ?Skin: ?   General: Skin is warm  and dry.  ?   Capillary Refill: Capillary refill takes less than 2 seconds.  ?Neurological:  ?   General: No focal deficit present.  ?   Mental Status: He is alert.  ?   Comments: He does have neuropathy in both lower extremities below his knees a stocking glove distribution  ? ? ? ?ED Results / Procedures / Treatments   ?Labs ?(all labs ordered are listed, but only abnormal results are displayed) ?Labs Reviewed  ?BRAIN NATRIURETIC PEPTIDE - Abnormal; Notable for the following components:  ?    Result Value  ?  B Natriuretic Peptide 334.5 (*)   ? All other components within normal limits  ?COMPREHENSIVE METABOLIC PANEL - Abnormal; Notable for the following components:  ? Glucose, Bld 142 (*)   ? BUN 24 (*)   ? Creatinine, Ser 1.31 (*)   ? Total Bilirubin 1.5 (*)   ? GFR, Estimated 54 (*)   ? All other components within normal limits  ?CBC WITH DIFFERENTIAL/PLATELET - Abnormal; Notable for the following components:  ? WBC 15.0 (*)   ? Hemoglobin 11.3 (*)   ? HCT 35.7 (*)   ? RDW 16.5 (*)   ? Neutro Abs 12.9 (*)   ? All other components within normal limits  ?URINALYSIS, ROUTINE W REFLEX MICROSCOPIC - Abnormal; Notable for the following components:  ? Protein, ur TRACE (*)   ? Leukocytes,Ua TRACE (*)   ? All other components within normal limits  ?GLUCOSE, CAPILLARY - Abnormal; Notable for the following components:  ? Glucose-Capillary 120 (*)   ? All other components within normal limits  ?SEDIMENTATION RATE - Abnormal; Notable for the following components:  ? Sed Rate 35 (*)   ? All other components within normal limits  ?C-REACTIVE PROTEIN - Abnormal; Notable for the following components:  ? CRP 16.6 (*)   ? All other components within normal limits  ?PREALBUMIN - Abnormal; Notable for the following components:  ? Prealbumin 9.2 (*)   ? All other components within normal limits  ?CBC - Abnormal; Notable for the following components:  ? WBC 11.5 (*)   ? RBC 3.91 (*)   ? Hemoglobin 10.7 (*)   ? HCT 33.7 (*)   ? RDW  16.5 (*)   ? All other components within normal limits  ?BASIC METABOLIC PANEL - Abnormal; Notable for the following components:  ? Potassium 3.4 (*)   ? Glucose, Bld 138 (*)   ? BUN 26 (*)   ? Calcium 8.3 (*)   ?

## 2021-08-08 NOTE — ED Notes (Signed)
[facility] at bedside 

## 2021-08-09 ENCOUNTER — Inpatient Hospital Stay (HOSPITAL_COMMUNITY): Payer: Medicare PPO

## 2021-08-09 DIAGNOSIS — I700 Atherosclerosis of aorta: Secondary | ICD-10-CM

## 2021-08-09 DIAGNOSIS — I5023 Acute on chronic systolic (congestive) heart failure: Secondary | ICD-10-CM

## 2021-08-09 DIAGNOSIS — E11628 Type 2 diabetes mellitus with other skin complications: Secondary | ICD-10-CM | POA: Diagnosis not present

## 2021-08-09 DIAGNOSIS — L089 Local infection of the skin and subcutaneous tissue, unspecified: Secondary | ICD-10-CM | POA: Diagnosis not present

## 2021-08-09 DIAGNOSIS — I509 Heart failure, unspecified: Secondary | ICD-10-CM | POA: Diagnosis not present

## 2021-08-09 DIAGNOSIS — I482 Chronic atrial fibrillation, unspecified: Secondary | ICD-10-CM

## 2021-08-09 LAB — ECHOCARDIOGRAM COMPLETE
AR max vel: 2.27 cm2
AV Area VTI: 1.95 cm2
AV Area mean vel: 2.16 cm2
AV Mean grad: 10.5 mmHg
AV Peak grad: 19.9 mmHg
Ao pk vel: 2.23 m/s
Area-P 1/2: 2.73 cm2
Calc EF: 47.4 %
Height: 71 in
MV VTI: 2.3 cm2
S' Lateral: 4.2 cm
Single Plane A2C EF: 46.4 %
Single Plane A4C EF: 45 %
Weight: 4000 oz

## 2021-08-09 LAB — CBC
HCT: 33.7 % — ABNORMAL LOW (ref 39.0–52.0)
Hemoglobin: 10.7 g/dL — ABNORMAL LOW (ref 13.0–17.0)
MCH: 27.4 pg (ref 26.0–34.0)
MCHC: 31.8 g/dL (ref 30.0–36.0)
MCV: 86.2 fL (ref 80.0–100.0)
Platelets: 163 10*3/uL (ref 150–400)
RBC: 3.91 MIL/uL — ABNORMAL LOW (ref 4.22–5.81)
RDW: 16.5 % — ABNORMAL HIGH (ref 11.5–15.5)
WBC: 11.5 10*3/uL — ABNORMAL HIGH (ref 4.0–10.5)
nRBC: 0 % (ref 0.0–0.2)

## 2021-08-09 LAB — BASIC METABOLIC PANEL
Anion gap: 9 (ref 5–15)
BUN: 26 mg/dL — ABNORMAL HIGH (ref 8–23)
CO2: 24 mmol/L (ref 22–32)
Calcium: 8.3 mg/dL — ABNORMAL LOW (ref 8.9–10.3)
Chloride: 103 mmol/L (ref 98–111)
Creatinine, Ser: 1.2 mg/dL (ref 0.61–1.24)
GFR, Estimated: >60 mL/min (ref >60)
Glucose, Bld: 138 mg/dL — ABNORMAL HIGH (ref 70–99)
Potassium: 3.4 mmol/L — ABNORMAL LOW (ref 3.5–5.1)
Sodium: 136 mmol/L (ref 135–145)

## 2021-08-09 LAB — GLUCOSE, CAPILLARY
Glucose-Capillary: 141 mg/dL — ABNORMAL HIGH (ref 70–99)
Glucose-Capillary: 163 mg/dL — ABNORMAL HIGH (ref 70–99)
Glucose-Capillary: 124 mg/dL — ABNORMAL HIGH (ref 70–99)
Glucose-Capillary: 180 mg/dL — ABNORMAL HIGH (ref 70–99)

## 2021-08-09 MED ORDER — VANCOMYCIN HCL 1250 MG/250ML IV SOLN
1250.0000 mg | INTRAVENOUS | Status: DC
  Administered 2021-08-09 – 2021-08-10 (×2): 1250 mg via INTRAVENOUS
  Filled 2021-08-09 (×4): qty 250

## 2021-08-09 MED ORDER — ADULT MULTIVITAMIN W/MINERALS CH
1.0000 | ORAL_TABLET | Freq: Every day | ORAL | Status: DC
  Administered 2021-08-09 – 2021-08-12 (×4): 1 via ORAL
  Filled 2021-08-09 (×4): qty 1

## 2021-08-09 MED ORDER — MEDIHONEY WOUND/BURN DRESSING EX PSTE
1.0000 "application " | PASTE | Freq: Every day | CUTANEOUS | Status: DC
  Administered 2021-08-09 – 2021-08-10 (×2): 1 via TOPICAL
  Filled 2021-08-09: qty 44

## 2021-08-09 MED ORDER — ASCORBIC ACID 500 MG PO TABS
250.0000 mg | ORAL_TABLET | Freq: Two times a day (BID) | ORAL | Status: DC
  Administered 2021-08-09 – 2021-08-12 (×7): 250 mg via ORAL
  Filled 2021-08-09 (×7): qty 1

## 2021-08-09 MED ORDER — ZINC SULFATE 220 (50 ZN) MG PO CAPS
220.0000 mg | ORAL_CAPSULE | Freq: Every day | ORAL | Status: DC
  Administered 2021-08-09 – 2021-08-12 (×4): 220 mg via ORAL
  Filled 2021-08-09 (×5): qty 1

## 2021-08-09 MED ORDER — SODIUM CHLORIDE 0.9 % IV SOLN
2.0000 g | Freq: Three times a day (TID) | INTRAVENOUS | Status: DC
  Administered 2021-08-09 – 2021-08-12 (×10): 2 g via INTRAVENOUS
  Filled 2021-08-09 (×10): qty 12.5

## 2021-08-09 MED ORDER — PERFLUTREN LIPID MICROSPHERE
1.0000 mL | INTRAVENOUS | Status: AC | PRN
  Administered 2021-08-09: 2 mL via INTRAVENOUS
  Filled 2021-08-09: qty 10

## 2021-08-09 MED ORDER — ENSURE MAX PROTEIN PO LIQD
11.0000 [oz_av] | Freq: Every day | ORAL | Status: DC
  Administered 2021-08-09 – 2021-08-11 (×3): 11 [oz_av] via ORAL
  Filled 2021-08-09 (×4): qty 330

## 2021-08-09 NOTE — Consult Note (Addendum)
WOC Nurse Consult Note: ?Reason for Consult:Nonhealing neuropathic ulcer to left plantar foot on first metatarsal head ?Wound type: neuropathic nonhealing.  Seen by podiatry.  Last office visit 08/02/2021  Seen by Podiatry PA this Am as well.  Will implement topical orders today.  ?Pressure Injury POA: NA ?Measurement: 1 cm x 0.5 cm callous to periwound, unable to appreciate depth ?Wound bed: 90% callous 10% ruddy red, patient states that podiatry scraped wound bed this AM.  ?Drainage (amount, consistency, odor) Minimal serosanguinous  no odor ?Periwound: callous  erythema and edema to left foot ?Dressing procedure/placement/frequency: Cleanse wound to left great toe with soap and water and pat dry.  Apply medihoney to wound bed and cover with dry dressing daily.  ?Will not follow at this time.  Please re-consult if needed.  ?Domenic Moras MSN, RN, FNP-BC CWON ?Wound, Ostomy, Continence Nurse ?Pager [phone]  ? ?  ?

## 2021-08-09 NOTE — Progress Notes (Signed)
?  Echocardiogram ?2D Echocardiogram has been performed. ? ?Shawn Blanchard ?08/09/2021, 9:53 AM ?

## 2021-08-09 NOTE — Progress Notes (Signed)
?      ? ? ? [facility] ?                                                                           ? ? ?Shawn Blanchard, is a 86 y.o. male, DOB - February 09, 1936, IRJ:[id number] ?Admit date - 08/08/2021    ?Outpatient Primary MD for the patient is [name], [name], MD ? ?LOS - 1  days ? ?Chief Complaint  ?Patient presents with  ? Shortness of Breath  ?    ? ?Brief summary  ? ?Patient is a 86 year old male with history of chronic systolic CHF, adenocarcinoma small bowel, lymphoma small intestine, history of multiple SBO's, chronic A-fib on Pradaxa, CAD, diabetic polyneuropathy, GERD, hyperlipidemia, chronic gout, pulmonary hypertension, restrictive lung disease, diabetes mellitus type 2 presented to ED after having a fall in the bathroom.  Patient also reported recent dyspnea associated with lower extremity edema, occasional nonproductive cough.  No chest pain, orthopnea, PND or palpitations.  He also reported frequent postural dizziness, frequently adds salt to his meals.  Patient also noted to have left foot swelling with surrounding erythema, area with purulent collection, tenderness. ?Left foot x-ray did not show acute osteomyelitis chest x-ray showed cardiomegaly with minimal bibasilar atelectasis. ? ? ?Assessment & Plan  ? ? ?Principal Problem: ?  Diabetic foot infection ([place]) ?-Following podiatry, Dr. Blenda Mounts, has ulceration of the left foot, not fully healed, worsened over the last 2 to 3 days with pain, swelling and unable to bear weight ?-Discussed with pharmacy, placed on IV vancomycin, cefepime and Flagyl. ?-MRI showed soft tissue wound along the plantar aspect of the first MTP joint with surrounding cellulitis and a 10 mm abscess.  Large first MTP joint effusion with synovitis.  Periarticular erosion involving the medial aspect of first metatarsal head.  No acute osteomyelitis ?-Podiatry has been consulted, seen by Dr. Sherryle Lis.  ?- Will await podiatry recommendation regarding any surgical plans, holding  Pradaxa ? ?Active Problems: ?  Chronic atrial fibrillation ([place]) ?-Currently rate controlled, continue Coreg ?-Hold Pradaxa, awaiting if any surgical plans by podiatry ? ?Mild acute on chronic systolic CHF ?-Received Lasix 40 mg IV x1 in ED, resumed oral Lasix 20 mg twice daily ?-Continue Coreg, follow 2D echo ? ?  Coronary artery disease involving native coronary artery of native heart with angina pectoris ([name]) ?-Currently no acute chest pain, follow 2D echo ?-Continue Coreg, Lasix, Zetia ?-Hold Pradaxa until surgical plans confirmed ? ?  Type 2 diabetes mellitus with neurologic complication, without long-term current use of insulin ([place]) ?-Hemoglobin A1c 6.9 on 06/22/2021 ?- continue Tradjenta.  Hold metformin ?-Placed on sliding scale insulin while inpatient ? ? ?  Hyperlipidemia ?-Continue Zetia ?   ? ?CKD, stage IIIa ?-Creatinine currently at baseline, 1.2-1.3 ? ?Obesity, class I ?Estimated body mass index is 34.87 kg/m? as calculated from the following: ?  Height as of this encounter: '5\' 11"'$  (1.803 m). ?  Weight as of this encounter: 113.4 kg. ? ?Code Status: Full CODE STATUS ?DVT Prophylaxis:  SCDs Start: 08/09/21 1000 ? ? ?Level of Care: Level of care: Telemetry Medical ?Family Communication: Updated patient ? ? ?Disposition Plan:      Remains inpatient appropriate: Continue  IV antibiotics ? ? ?Procedures:  ?MRI left foot ? ?Consultants:   ?Podiatry ? ?Antimicrobials:  ?IV cefepime 08/09/2021-->  ?IV vancomycin 08/09/2021-->  ?IV Flagyl 08/08/2021-->  ? ? ?Medications ? carvedilol  3.125 mg Oral BID WC  ? ezetimibe  10 mg Oral Daily  ? ferrous sulfate  325 mg Oral BID  ? furosemide  20 mg Oral BID  ? insulin aspart  0-9 Units Subcutaneous TID WC  ? leptospermum manuka honey  1 application. Topical Daily  ? linagliptin  5 mg Oral Daily  ? melatonin  5 mg Oral QHS  ? metFORMIN  750 mg Oral BID WC  ? metroNIDAZOLE  500 mg Oral Q12H  ? ? ? ? ?Subjective:  ? ?Shawn Blanchard was seen and examined today.  Left foot  dressing intact, noted edema around the dressing, states tender on bearing weight.  Patient denies dizziness, chest pain, shortness of breath, abdominal pain, N/V/D/C, new weakness, numbess, tingling. No acute events overnight.  No fevers. ? ?Objective:  ? ?Vitals:  ? 08/08/21 2000 08/08/21 2142 08/09/21 0110 08/09/21 0447  ?BP: (!) 157/55 (!) 193/125 125/64 (!) 133/56  ?Pulse: 74 88 74 68  ?Resp: '18  20 16  '$ ?Temp: 98.1 ?F (36.7 ?C)  98.2 ?F (36.8 ?C) (!) 97.4 ?F (36.3 ?C)  ?TempSrc: Oral  Oral Oral  ?SpO2: 97%  95% 95%  ?Weight:      ?Height:      ? ? ?Intake/Output Summary (Last 24 hours) at 08/09/2021 1123 ?Last data filed at 08/09/2021 1008 ?Gross per 24 hour  ?Intake 572.59 ml  ?Output 1080 ml  ?Net -507.41 ml  ? ? ? ?Wt Readings from Last 3 Encounters:  ?08/08/21 113.4 kg  ?07/24/21 117.3 kg  ?06/29/21 115.7 kg  ? ? ? ?Exam ?General: Alert and oriented x 3, NAD ?Cardiovascular: Irregularly irregular ?Respiratory: Clear to auscultation bilaterally, no wheezing ?Gastrointestinal: Soft, nontender, nondistended, + bowel sounds ?Ext: no pedal edema bilaterally ?Neuro: Strength 5/5 upper and lower extremities bilaterally ?Skin: Left foot dressing intact ?Psych: Normal affect and demeanor, alert and oriented x3  ? ? ? ?Data Reviewed:  I have personally reviewed following labs  ? ? ?CBC ?Lab Results  ?Component Value Date  ? WBC 11.5 (H) 08/09/2021  ? RBC 3.91 (L) 08/09/2021  ? HGB 10.7 (L) 08/09/2021  ? HCT 33.7 (L) 08/09/2021  ? MCV 86.2 08/09/2021  ? MCH 27.4 08/09/2021  ? PLT 163 08/09/2021  ? MCHC 31.8 08/09/2021  ? RDW 16.5 (H) 08/09/2021  ? LYMPHSABS 0.9 08/08/2021  ? MONOABS 1.0 08/08/2021  ? EOSABS 0.1 08/08/2021  ? BASOSABS 0.0 08/08/2021  ? ? ? ?Last metabolic panel ?Lab Results  ?Component Value Date  ? NA 136 08/09/2021  ? K 3.4 (L) 08/09/2021  ? CL 103 08/09/2021  ? CO2 24 08/09/2021  ? BUN 26 (H) 08/09/2021  ? CREATININE 1.20 08/09/2021  ? GLUCOSE 138 (H) 08/09/2021  ? GFRNONAA >60 08/09/2021  ? GFRAA >60  12/17/2019  ? CALCIUM 8.3 (L) 08/09/2021  ? PHOS 2.8 08/08/2021  ? PROT 7.1 08/08/2021  ? ALBUMIN 4.1 08/08/2021  ? BILITOT 1.5 (H) 08/08/2021  ? ALKPHOS 50 08/08/2021  ? AST 15 08/08/2021  ? ALT 12 08/08/2021  ? ANIONGAP 9 08/09/2021  ? ? ?CBG (last 3)  ?Recent Labs  ?  08/08/21 ?1639 08/08/21 ?2113 08/09/21 ?0751  ?GLUCAP 120* 180* 141*  ?  ? ? ?Coagulation Profile: ?No results for input(s): INR, PROTIME in the last  168 hours. ? ? ?Radiology Studies: I have personally reviewed the imaging studies  ?MR FOOT LEFT W WO CONTRAST ? ?Result Date: 08/09/2021 ?CLINICAL DATA:  Recurrent left great toe ulcer. EXAM: MRI OF THE LEFT FOREFOOT WITHOUT AND WITH CONTRAST TECHNIQUE: Multiplanar, multisequence MR imaging of the left forefoot was performed both before and after administration of intravenous contrast. CONTRAST:  54m GADAVIST GADOBUTROL 1 MMOL/ML IV SOLN COMPARISON:  08/08/2021 FINDINGS: Bones/Joint/Cartilage No fracture or dislocation. Normal alignment. Mild osteoarthritis of the first MTP joint. Periarticular erosion involving the medial aspect of the first metatarsal head. Large first MTP joint effusion with synovitis. Mild edema in the medial hallux sesamoid and adjacent plantar aspect of the first metatarsal head likely reflecting arthritic changes. Moderate osteoarthritis of the navicular-medial cuneiform joint with subchondral reactive marrow edema. No other marrow signal abnormality. Ligaments Collateral ligaments are intact.  Lisfranc ligament is intact. Muscles and Tendons Flexor, peroneal and extensor compartment tendons are intact. Generalized muscle atrophy. Soft tissue No soft tissue mass. Edema in the subcutaneous fat throughout the foot most severe along the dorsal aspect. Soft tissue wound along the plantar aspect of the first MTP joint with a 10 mm fluid collection. IMPRESSION: 1. Soft tissue wound along the plantar aspect of the first MTP joint with surrounding cellulitis and a 10 mm abscess. Large  first MTP joint effusion with synovitis. Periarticular erosion involving the medial aspect of the first metatarsal head. Differential considerations include septic arthritis of the first MTP joint versus

## 2021-08-09 NOTE — Progress Notes (Signed)
Pharmacy Antibiotic Note ? ?Shawn Blanchard is a 86 y.o. male admitted on 08/08/2021 with diabetic foot infection.  Pharmacy has been consulted for cefepime/vancom dosing. ? ?Plan: ?Cefepime 2g IV q8h ?Vancomycin '1250mg'$  IV q24h for estimated AUC 484 using SCr 1.2 ?Check vancomycin levels at steady state, goal 400-550 ?Follow up renal function & cultures ? ?Height: '5\' 11"'$  (180.3 cm) ?Weight: 113.4 kg (250 lb) ?IBW/kg (Calculated) : 75.3 ? ?Temp (24hrs), Avg:98.1 ?F (36.7 ?C), Min:97.4 ?F (36.3 ?C), Max:98.8 ?F (37.1 ?C) ? ?Recent Labs  ?Lab 08/08/21 ?8338 08/08/21 ?1140 08/09/21 ?2505  ?WBC 15.0*  --  11.5*  ?CREATININE 1.31*  --  1.20  ?LATICACIDVEN 1.3 1.2  --   ?  ?Estimated Creatinine Clearance: 59.7 mL/min (by C-G formula based on SCr of 1.2 mg/dL).   ? ?Allergies  ?Allergen Reactions  ? Atorvastatin Other (See Comments)  ?  Dizziness.  ? Jardiance [Empagliflozin] Other (See Comments)  ?  Orthostasis  ? Ace Inhibitors Rash  ? Amoxicillin Rash  ? Meperidine Rash  ? Meperidine Hcl Rash  ? Naproxen Sodium Rash  ? Sulfa Antibiotics Rash  ? Sulfamethoxazole Rash  ? Tetracycline Rash  ? ? ?Antimicrobials this admission: ?5/3 CTX >> 5/4 ?5/3 Flagyl >> ?5/4 Vanc >> ?5/4 Cefepime >> ? ?Dose adjustments this admission: ? ?Microbiology results: ?5/3 BCx: ngtd ? ?Thank you for allowing pharmacy to be a part of this patientShawns care. ? ?Peggyann Juba, PharmD, BCPS ?Pharmacy: [phone] ?08/09/2021 9:45 AM ? ?

## 2021-08-09 NOTE — Progress Notes (Signed)
Initial Nutrition Assessment ? ?DOCUMENTATION CODES:  ? ?Obesity unspecified ? ?INTERVENTION:  ? ?-Ensure MAX Protein po daily, each supplement provides 150 kcal and 30 grams of protein  ? ?-Multivitamin with minerals daily ? ?-250 mg Vitamin C BID ?-220 mg Zinc sulfate (x 14 days) ? ?NUTRITION DIAGNOSIS:  ? ?Increased nutrient needs related to wound healing as evidenced by estimated needs. ? ?GOAL:  ? ?Patient will meet greater than or equal to 90% of their needs ? ?MONITOR:  ? ?PO intake, Supplement acceptance, Labs, Weight trends, I & O's, Skin ? ?REASON FOR ASSESSMENT:  ? ?Consult ?Wound healing ? ?ASSESSMENT:  ? ?86 y.o. 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 ? ?Patient in room, granddaughter at bedside. They were both eating [employer] sandwiches for lunch. Pt reports he "eats too good". Pt has a good appetite, consumes ~5-6 meals a day. Encouraged adequate protein intake. Pt states his blood sugars have been steady ~140. Pt takes a daily MVI and turmeric at home. ? Pt is agreeable to receiving supplements to aid in wound healing. Will add Ensure Max, MVI, Vitamin C and Zinc.  ? ?PO intake: 100% of meals.  ? ?Per weight records, pt has lost 8 lbs since 4/18 (3% wt loss x 2 weeks, significant for time frame).  ?Pt reports UBW of 255 lbs. ? ?Medications: Ferrous sulfate,  Lasix ? ?Labs reviewed: ? CBGs: 141-180 ?Low K ? ?NUTRITION - FOCUSED PHYSICAL EXAM: ? ?No depletions noted. ? ?Diet Order:   ?Diet Order   ? ?       ?  Diet heart healthy/carb modified Room service appropriate? Yes; Fluid consistency: Thin  Diet effective now       ?  ? ?  ?  ? ?  ? ? ?EDUCATION NEEDS:  ? ?Education needs have been addressed ? ?Skin:  Skin Assessment: Reviewed RN Assessment ? ?Last BM:  5/2 ? ?Height:  ? ?Ht Readings from Last 1 Encounters:  ?08/08/21 '5\' 11"'$  (1.803 m)  ? ? ?Weight:  ? ?Wt Readings from Last 1 Encounters:  ?08/08/21 113.4 kg  ? ? ?BMI:  Body mass index is 34.87 kg/m?. ? ?Estimated Nutritional Needs:  ? ?Kcal:  1950-2150 ? ?Protein:  100-115g ? ?Fluid:  1.9L/day ? ?Clayton Bibles, MS, RD, LDN ?Inpatient Clinical Dietitian ?Contact information available via Amion ? ?

## 2021-08-09 NOTE — Progress Notes (Signed)
Inpatient Diabetes Program Recommendations ? ?AACE/ADA: New Consensus Statement on Inpatient Glycemic Control (2015) ? ?Target Ranges:  Prepandial:   less than 140 mg/dL ?     Peak postprandial:   less than 180 mg/dL (1-2 hours) ?     Critically ill patients:  140 - 180 mg/dL  ? ?Lab Results  ?Component Value Date  ? GLUCAP 163 (H) 08/09/2021  ? HGBA1C 6.9 (A) 06/22/2021  ? ? ?Review of Glycemic Control ? ?Diabetes history: DM2 ?Outpatient Diabetes medications: Janumet [phone] mg QAM, metformin 500 mg in afternoon ?Current orders for Inpatient glycemic control: Novolog 0-9 TID with meals, tradjenta 5 mg QD ? ?HgbA1C - 6.9% ? ?Inpatient Diabetes Program Recommendations:   ? ?Agree with orders.  ? ?Will follow glucose trends. ? ?Thank you. ?Lorenda Peck, RD, LDN, CDE ?Inpatient Diabetes Coordinator ?[phone]  ? ? ? ? ?

## 2021-08-09 NOTE — Consult Note (Signed)
? ?Reason for Consult: Diabetic foot infection left foot ?Referring Physician: Dr. Olevia Bowens ? ?Shawn Blanchard is an 86 y.o. male.  ?HPI: He is known to our practice and has been under the care of Dr. Blenda Mounts for an ulceration of the left foot that has been waxing and waning but has not fully healed.  He says it worsened over the last 2 to 3 days and had significant amount of pain and swelling that developed ? ?Past Medical History:  ?Diagnosis Date  ? Acute on chronic systolic congestive heart failure ([place]) 11/07/2017  ? 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[place]) 08/30/2015  ? Arthritis 03/12/2016  ? Bilateral hand pain 03/25/2019  ? Blepharitis of both upper and lower eyelid 10/02/2018  ? Carpal tunnel syndrome of right wrist 03/25/2019  ? Chronic anticoagulation 10/11/2016  ? Chronic atrial fibrillation ([place]) 06/12/2015  ? Overview:  Managed CARDS  ? Chronic diastolic heart failure ([place]) 09/01/2015  ? Coronary artery disease involving native coronary artery of native heart with angina pectoris ([place]) 02/15/2015  ? Overview:  Hx of MI with stent to LAD 2002 MPS 2016 with EF 47% and no ischemia  ? Dermatochalasis of both upper eyelids 11/21/2016  ? Dermatochalasis of right upper eyelid 10/02/2018  ? Diabetic polyneuropathy associated with type 2 diabetes mellitus ([place]) 12/16/2018  ? 2020  ? Dyslipidemia 11/07/2017  ? Essential hypertension 11/07/2017  ? Ganglion cyst 11/27/2015  ? Overview:  2017: dorsum right hand  ? Gastroesophageal reflux disease without esophagitis 06/12/2015  ? Hyperlipidemia 04/11/2016  ? Hypertensive heart disease with heart failure ([place]) 02/15/2015  ? Idiopathic chronic gout 05/17/2015  ? Insomnia 11/27/2015  ? Leukocytosis 04/11/2016  ? Lightheadedness 10/11/2016  ? Long term current use of oral hypoglycemic drug 10/02/2018  ? 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[place]) 08/30/2015  ? Male erectile disorder 06/17/2016  ? Meibomian gland dysfunction (MGD) of both eyes 10/02/2018  ? MI (myocardial infarction) ([place]) 02/15/2015  ? Overview:  2002   ? Obesity 04/11/2016  ? 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 exacerbation ([place]) 06/12/2015  ? Obstructive sleep apnea 08/30/2015  ? Overview:  CPAP  ? Old MI (myocardial infarction)   ? Orthostatic dizziness 09/15/2017  ? 2018: med related  ? Permanent atrial fibrillation ([place]) 06/12/2015  ? Overview:  Managed CARDS  ? Pseudophakia of both eyes 03/12/2016  ? Pulmonary hypertension ([place]) 08/30/2015  ? Overview:  Managed CARDS  ? Purulent inflammation of skin 12/29/2018  ? Restless leg syndrome 08/30/2015  ? Restrictive lung disease 12/26/2016  ? Overview:  2018: mod on spirometry  ? SBO (small bowel obstruction) ([place]) 12/14/2019  ? Senile nuclear sclerosis 05/02/2016  ? Small bowel obstruction ([place]) 04/11/2016  ? SOB (shortness of breath) 08/14/2015  ? Type 2 diabetes mellitus with neurologic complication, without long-term current use of insulin ([place]) 12/02/2014  ? Unilateral inguinal hernia without obstruction or gangrene 06/12/2015  ? ? ?Past Surgical History:  ?Procedure Laterality Date  ? ABDOMINAL ADHESION SURGERY    ? x 2  ? BLEPHAROPLASTY Bilateral   ? CARPAL TUNNEL RELEASE Right 2021  ? CATARACT EXTRACTION Bilateral   ? CERVICAL SPINE SURGERY    ? CHOLECYSTECTOMY    ? CORONARY ANGIOPLASTY WITH STENT PLACEMENT  2000  ? NASAL SINUS SURGERY    ? ORIF TIBIAL SHAFT FRACTURE W/ PLATES AND SCREWS Left   ? PARTIAL KNEE ARTHROPLASTY Bilateral   ? SMALL INTESTINE SURGERY    ? Adenocarcinoma  ? TONSILLECTOMY    ? ? ?Family History  ?  Problem Relation Age of Onset  ? Cancer Mother   ?     Ovarian  ? Diabetes Mother   ? Heart disease Mother   ? Heart disease Father   ? Stroke Father   ? Diabetes Father   ? Diabetes Sister   ? Diabetes Brother   ? Diabetes Brother   ? Cancer Paternal Grandfather   ?     Prostate  ? Colon cancer Neg Hx   ? Rectal cancer Neg Hx   ? Pancreatic cancer Neg Hx   ? Liver cancer Neg Hx   ? Stomach cancer Neg Hx   ? ? ?Social History:  reports that he has never smoked. He has never used smokeless tobacco. He reports  that he does not currently use alcohol. He reports that he does not use drugs. ? ?Allergies:  ?Allergies  ?Allergen Reactions  ? Atorvastatin Other (See Comments)  ?  Dizziness.  ? Jardiance [Empagliflozin] Other (See Comments)  ?  Orthostasis  ? Ace Inhibitors Rash  ? Amoxicillin Rash  ? Meperidine Rash  ? Meperidine Hcl Rash  ? Naproxen Sodium Rash  ? Sulfa Antibiotics Rash  ? Sulfamethoxazole Rash  ? Tetracycline Rash  ? ? ?Medications: I have reviewed the patient's current medications. ? ?Results for orders placed or performed during the hospital encounter of 08/08/21 (from the past 48 hour(s))  ?Brain natriuretic peptide     Status: Abnormal  ? Collection Time: 08/08/21  9:41 AM  ?Result Value Ref Range  ? B Natriuretic Peptide 334.5 (H) 0.0 - 100.0 pg/mL  ?  Comment: Performed at [facility], [address], [place], [place] [address]  ?Comprehensive metabolic panel     Status: Abnormal  ? Collection Time: 08/08/21  9:41 AM  ?Result Value Ref Range  ? Sodium 137 135 - 145 mmol/L  ? Potassium 3.7 3.5 - 5.1 mmol/L  ? Chloride 100 98 - 111 mmol/L  ? CO2 27 22 - 32 mmol/L  ? Glucose, Bld 142 (H) 70 - 99 mg/dL  ?  Comment: Glucose reference range applies only to samples taken after fasting for at least 8 hours.  ? BUN 24 (H) 8 - 23 mg/dL  ? Creatinine, Ser 1.31 (H) 0.61 - 1.24 mg/dL  ? Calcium 9.2 8.9 - 10.3 mg/dL  ? Total Protein 7.1 6.5 - 8.1 g/dL  ? Albumin 4.1 3.5 - 5.0 g/dL  ? AST 15 15 - 41 U/L  ? ALT 12 0 - 44 U/L  ? Alkaline Phosphatase 50 38 - 126 U/L  ? Total Bilirubin 1.5 (H) 0.3 - 1.2 mg/dL  ? GFR, Estimated 54 (L) >60 mL/min  ?  Comment: (NOTE) ?Calculated using the CKD-EPI Creatinine Equation (2021) ?  ? Anion gap 10 5 - 15  ?  Comment: Performed at [facility], [address] [place], [place] [address]  ?Troponin I (High Sensitivity)     Status: None  ? Collection Time: 08/08/21  9:41 AM  ?Result Value Ref Range  ? Troponin I (High Sensitivity) 14 <18 ng/L   ?  Comment: (NOTE) ?Elevated high sensitivity troponin I (hsTnI) values and significant  ?changes across serial measurements may suggest ACS but many other  ?chronic and acute conditions are known to elevate hsTnI results.  ?Refer to the "Links" section for chest pain algorithms and additional  ?guidance. ?Performed at [facility], [address] ?[place], [place] [address] ?  ?CBC with Differential     Status:  Abnormal  ? Collection Time: 08/08/21  9:41 AM  ?Result Value Ref Range  ? WBC 15.0 (H) 4.0 - 10.5 K/uL  ? RBC 4.25 4.22 - 5.81 MIL/uL  ? Hemoglobin 11.3 (L) 13.0 - 17.0 g/dL  ? HCT 35.7 (L) 39.0 - 52.0 %  ? MCV 84.0 80.0 - 100.0 fL  ? MCH 26.6 26.0 - 34.0 pg  ? MCHC 31.7 30.0 - 36.0 g/dL  ? RDW 16.5 (H) 11.5 - 15.5 %  ? Platelets 177 150 - 400 K/uL  ? nRBC 0.0 0.0 - 0.2 %  ? Neutrophils Relative % 85 %  ? Neutro Abs 12.9 (H) 1.7 - 7.7 K/uL  ? Lymphocytes Relative 6 %  ? Lymphs Abs 0.9 0.7 - 4.0 K/uL  ? Monocytes Relative 7 %  ? Monocytes Absolute 1.0 0.1 - 1.0 K/uL  ? Eosinophils Relative 1 %  ? Eosinophils Absolute 0.1 0.0 - 0.5 K/uL  ? Basophils Relative 0 %  ? Basophils Absolute 0.0 0.0 - 0.1 K/uL  ? Immature Granulocytes 1 %  ? Abs Immature Granulocytes 0.07 0.00 - 0.07 K/uL  ?  Comment: Performed at [facility], [address] [place], [place] [address]  ?Lactic acid, plasma     Status: None  ? Collection Time: 08/08/21  9:41 AM  ?Result Value Ref Range  ? Lactic Acid, Venous 1.3 0.5 - 1.9 mmol/L  ?  Comment: Performed at [facility], [address] [place], [place] [address]  ?Culture, blood (routine x 2)     Status: None (Preliminary result)  ? Collection Time: 08/08/21  9:42 AM  ? Specimen: BLOOD RIGHT ARM  ?Result Value Ref Range  ? Specimen Description    ?  BLOOD RIGHT ARM ?Performed at [facility], [address], [place], [place] [address] ?  ? Special Requests    ?  BOTTLES DRAWN AEROBIC AND ANAEROBIC Blood  Culture adequate volume ?Performed at [facility], [address] [place], [place] [address] ?  ? Culture    ?  NO GROWTH < 24 HOURS ?Performed at [facility], [place]

## 2021-08-10 DIAGNOSIS — I509 Heart failure, unspecified: Secondary | ICD-10-CM | POA: Diagnosis not present

## 2021-08-10 DIAGNOSIS — E1140 Type 2 diabetes mellitus with diabetic neuropathy, unspecified: Secondary | ICD-10-CM | POA: Diagnosis not present

## 2021-08-10 DIAGNOSIS — I5023 Acute on chronic systolic (congestive) heart failure: Secondary | ICD-10-CM | POA: Diagnosis not present

## 2021-08-10 DIAGNOSIS — E11628 Type 2 diabetes mellitus with other skin complications: Secondary | ICD-10-CM | POA: Diagnosis not present

## 2021-08-10 DIAGNOSIS — I700 Atherosclerosis of aorta: Secondary | ICD-10-CM | POA: Diagnosis not present

## 2021-08-10 DIAGNOSIS — L089 Local infection of the skin and subcutaneous tissue, unspecified: Secondary | ICD-10-CM | POA: Diagnosis not present

## 2021-08-10 LAB — GLUCOSE, CAPILLARY
Glucose-Capillary: 134 mg/dL — ABNORMAL HIGH (ref 70–99)
Glucose-Capillary: 113 mg/dL — ABNORMAL HIGH (ref 70–99)
Glucose-Capillary: 166 mg/dL — ABNORMAL HIGH (ref 70–99)
Glucose-Capillary: 115 mg/dL — ABNORMAL HIGH (ref 70–99)

## 2021-08-10 LAB — BASIC METABOLIC PANEL
Anion gap: 12 (ref 5–15)
BUN: 30 mg/dL — ABNORMAL HIGH (ref 8–23)
CO2: 22 mmol/L (ref 22–32)
Calcium: 8.9 mg/dL (ref 8.9–10.3)
Chloride: 103 mmol/L (ref 98–111)
Creatinine, Ser: 1.18 mg/dL (ref 0.61–1.24)
GFR, Estimated: >60 mL/min (ref >60)
Glucose, Bld: 149 mg/dL — ABNORMAL HIGH (ref 70–99)
Potassium: 3.5 mmol/L (ref 3.5–5.1)
Sodium: 137 mmol/L (ref 135–145)

## 2021-08-10 MED ORDER — IPRATROPIUM-ALBUTEROL 0.5-2.5 (3) MG/3ML IN SOLN
3.0000 mL | Freq: Three times a day (TID) | RESPIRATORY_TRACT | Status: DC
  Filled 2021-08-10: qty 3

## 2021-08-10 MED ORDER — FUROSEMIDE 10 MG/ML IJ SOLN
20.0000 mg | Freq: Once | INTRAMUSCULAR | Status: AC
Start: 2021-08-10 — End: 2021-08-10
  Administered 2021-08-10: 20 mg via INTRAVENOUS
  Filled 2021-08-10: qty 2

## 2021-08-10 MED ORDER — DABIGATRAN ETEXILATE MESYLATE 150 MG PO CAPS
150.0000 mg | ORAL_CAPSULE | Freq: Two times a day (BID) | ORAL | Status: DC
  Administered 2021-08-10: 150 mg via ORAL
  Filled 2021-08-10: qty 1

## 2021-08-10 MED ORDER — LEVALBUTEROL HCL 0.63 MG/3ML IN NEBU
0.6300 mg | INHALATION_SOLUTION | Freq: Four times a day (QID) | RESPIRATORY_TRACT | Status: DC | PRN
  Administered 2021-08-10: 0.63 mg via RESPIRATORY_TRACT
  Filled 2021-08-10: qty 3

## 2021-08-10 NOTE — TOC Initial Note (Signed)
Transition of Care (TOC) - Initial/Assessment Note  ? ? ?Patient Details  ?Name: Shawn Blanchard ?MRN: [id number] ?Date of Birth: 1936/03/03 ? ?Transition of Care (TOC) CM/SW Contact:    ?Jessly Lebeck, Marjie Skiff, RN ?Phone Number: ?08/10/2021, 10:16 AM ? ?Clinical Narrative:                 ?Spoke with pt and daughter for dc planning. Pt walks with a walker independently at baseline. No DME needs expressed. Pt with CHF and diabetic foot wound. Choice offered for [facility]. [facility] chosen. [facility] liaison contacted for referral. Will need MD order for [facility] prior to dc. ? ?Expected Discharge Plan: [place] ?Barriers to Discharge: Continued Medical Work up ? ? ?Patient Goals and CMS Choice ?Patient states their goals for this hospitalization and ongoing recovery are:: To go home ?CMS Medicare.gov Compare Post Acute Care list provided to:: Patient ?Choice offered to / list presented to : Patient ? ?Expected Discharge Plan and Services ?Expected Discharge Plan: [place] ?  ?Discharge Planning Services: CM Consult ?Post Acute Care Choice: Home Health ?Living arrangements for the past 2 months: [place] ?                ?  ?  ?  ?  ?  ?HH Arranged: RN ?[place] Agency: [place] ?Date HH Agency Contacted: 08/10/21 ?Time [place]: 8280 ?Representative spoke with at [place]: Tommi Rumps ? ?Prior Living Arrangements/Services ?Living arrangements for the past 2 months: [place] ?Lives with:: Spouse ?Patient language and need for interpreter reviewed:: Yes ?Do you feel safe going back to the place where you live?: Yes      ?Need for Family Participation in Patient Care: Yes (Comment) ?Care giver support system in place?: Yes (comment) ?  ?Criminal Activity/Legal Involvement Pertinent to Current Situation/Hospitalization: No - Comment as needed ? ?Activities of Daily Living ?Home Assistive Devices/Equipment: Gilford Rile (specify type), Cane (specify quad or straight)  (rollator) ?ADL Screening (condition at time of admission) ?Patient's cognitive ability adequate to safely complete daily activities?: Yes ?Is the patient deaf or have difficulty hearing?: No ?Does the patient have difficulty seeing, even when wearing glasses/contacts?: No ?Does the patient have difficulty concentrating, remembering, or making decisions?: Yes ?Patient able to express need for assistance with ADLs?: Yes ?Does the patient have difficulty dressing or bathing?: No ?Independently performs ADLs?: Yes (appropriate for developmental age) ?Does the patient have difficulty walking or climbing stairs?: No ?Weakness of Legs: Both ?Weakness of Arms/Hands: Both ? ?Permission Sought/Granted ?Permission sought to share information with : Customer service manager ?Permission granted to share information with : Yes, Verbal Permission Granted ?   ? Permission granted to share info w AGENCY: [facility] ?   ?   ? ?Emotional Assessment ?Appearance:: Appears stated age ?Attitude/Demeanor/Rapport: Gracious ?Affect (typically observed): Calm ?Orientation: : Oriented to Self, Oriented to Place, Oriented to  Time, Oriented to Situation ?Alcohol / Substance Use: Not Applicable ?Psych Involvement: No (comment) ? ?Admission diagnosis:  Diabetic foot infection ([name]) [K34.917, L08.9] ?Patient Active Problem List  ? Diagnosis Date Noted  ? Diabetic foot infection ([place]) 08/08/2021  ? Class 1 obesity 08/08/2021  ? Aortic atherosclerosis ([place]) 06/22/2021  ? SBO (small bowel obstruction) ([place]) 05/17/2021  ? Hypertensive urgency 05/17/2021  ? UTI (urinary tract infection) 05/17/2021  ? Normocytic anemia 05/17/2021  ? History of lymphoma 05/17/2021  ? Iron deficiency anemia 04/27/2021  ? History of squamous cell carcinoma  in situ 04/18/2021  ? Statin intolerance 03/09/2021  ? Peritoneal adhesion 11/08/2020  ? Peripheral venous insufficiency 11/08/2020  ? Type 2 diabetes mellitus with stage 3a chronic kidney disease, without long-term  current use of insulin ([place]) 02/22/2020  ? Old MI (myocardial infarction)   ? 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 Sensorineural hearing loss (SNHL), bilateral 10/04/2019  ? Stage 3a chronic kidney disease ([place]) 06/13/2019  ? Carpal tunnel syndrome of right wrist 03/25/2019  ? Diabetic polyneuropathy associated with type 2 diabetes mellitus ([place]) 12/16/2018  ? Blepharitis of both upper and lower eyelid 10/02/2018  ? Meibomian gland dysfunction (MGD) of both eyes 10/02/2018  ? Essential hypertension 11/07/2017  ? Acute on chronic systolic CHF (congestive heart failure) ([place]) 11/07/2017  ? Orthostatic dizziness 09/15/2017  ? Dermatochalasis of both upper eyelids 11/21/2016  ? Chronic anticoagulation 10/11/2016  ? Male erectile disorder 06/17/2016  ? Hyperlipidemia 04/11/2016  ? Small bowel obstruction ([place]) 04/11/2016  ? BMI 35.0-35.9,adult 04/11/2016  ? Leukocytosis 04/11/2016  ? Arthritis 03/12/2016  ? Pseudophakia of both eyes 03/12/2016  ? Insomnia 11/27/2015  ? Ganglion cyst 11/27/2015  ? Chronic diastolic heart failure ([place]) 09/01/2015  ? 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[place]) 08/30/2015  ? Obstructive sleep apnea 08/30/2015  ? Pulmonary hypertension ([place]) 08/30/2015  ? Restless leg syndrome 08/30/2015  ? Chronic atrial fibrillation ([place]) 06/12/2015  ? Gastroesophageal reflux disease without esophagitis 06/12/2015  ? Idiopathic chronic gout 05/17/2015  ? Coronary artery disease involving native coronary artery of native heart with angina pectoris ([place]) 02/15/2015  ? Hypertensive heart disease with heart failure ([place]) 02/15/2015  ? Type 2 diabetes mellitus with neurologic complication, without long-term current use of insulin ([place]) 12/02/2014  ? ?PCP:  Haydee Salter, MD ?Pharmacy:   ?[facility] [place] - [address] ?[place] ?[place] [address] ?Phone: [phone] Fax: [phone] ? ? ? ? ?Social Determinants of  Health (SDOH) Interventions ?  ? ?Readmission Risk Interventions ? ?  08/10/2021  ? 10:14 AM  ?Readmission Risk Prevention Plan  ?Transportation Screening Complete  ?PCP or Specialist Appt within 5-7 Days Complete  ?Home Care Screening Complete  ?Medication Review (RN CM) Complete  ? ? ? ?

## 2021-08-10 NOTE — Progress Notes (Signed)
?      ? ? ? [facility] ?                                                                           ? ? ?Shawn Blanchard, is a 86 y.o. male, DOB - November 11, 1935, XBJ:[id number] ?Admit date - 08/08/2021    ?Outpatient Primary MD for the patient is [name], [name], MD ? ?LOS - 2  days ? ?Chief Complaint  ?Patient presents with  ? Shortness of Breath  ?    ? ?Brief summary  ? ?Patient is a 86 year old male with history of chronic systolic CHF, adenocarcinoma small bowel, lymphoma small intestine, history of multiple SBO's, chronic A-fib on Pradaxa, CAD, diabetic polyneuropathy, GERD, hyperlipidemia, chronic gout, pulmonary hypertension, restrictive lung disease, diabetes mellitus type 2 presented to ED after having a fall in the bathroom.  Patient also reported recent dyspnea associated with lower extremity edema, occasional nonproductive cough.  No chest pain, orthopnea, PND or palpitations.  He also reported frequent postural dizziness, frequently adds salt to his meals.  Patient also noted to have left foot swelling with surrounding erythema, area with purulent collection, tenderness. ?Left foot x-ray did not show acute osteomyelitis chest x-ray showed cardiomegaly with minimal bibasilar atelectasis. ? ? ?Assessment & Plan  ? ? ?Principal Problem: ?  Diabetic foot infection ([place]) ?-Following podiatry, Dr. Blenda Mounts, has ulceration of the left foot, not fully healed, worsened over the last 2 to 3 days with pain, swelling and unable to bear weight ?-Discussed with pharmacy, placed on IV vancomycin, cefepime and Flagyl. ?-MRI showed soft tissue wound along the plantar aspect of the first MTP joint with surrounding cellulitis and a 10 mm abscess.  Large first MTP joint effusion with synovitis.  Periarticular erosion involving the medial aspect of first metatarsal head.  No acute osteomyelitis ?-Hold Pradaxa, will await plans from podiatry for possible I&D in OR ?-Continue IV vancomycin, cefepime, Flagyl, ID  consulted ? ?Active Problems: ?  Chronic atrial fibrillation ([place]) ?-Currently rate controlled, continue Coreg ?-Hold Pradaxa, awaiting if any surgical plans by podiatry ? ?Acute on chronic systolic CHF ?-Received Lasix 40 mg IV x1 in ED, resumed oral Lasix 20 mg twice daily ?-Continue Coreg ?-2D echo showed EF of 45 to 50% with global hypokinesis ?-Wheezing today bilaterally, will give extra dose of Lasix 20 mg IV x1 ? ?  Coronary artery disease involving native coronary artery of native heart with angina pectoris ([place]) ?-Currently no acute chest pain,  ?-Continue Coreg, Lasix, Zetia ? ? ?  Type 2 diabetes mellitus with neurologic complication, without long-term current use of insulin ([place]) ?-Hemoglobin A1c 6.9 on 06/22/2021 ?- continue Tradjenta.  Hold metformin ?-Placed on sliding scale insulin while inpatient ? ? ?  Hyperlipidemia ?-Continue Zetia ?   ?CKD, stage IIIa ?-Creatinine currently at baseline, 1.2-1.3 ? ?Obesity, class I ?Estimated body mass index is 34.87 kg/m? as calculated from the following: ?  Height as of this encounter: '5\' 11"'$  (1.803 m). ?  Weight as of this encounter: 113.4 kg. ? ?Code Status: Full CODE STATUS ?DVT Prophylaxis:  SCDs Start: 08/09/21 1000 ? ? ?Level of Care: Level of care: Telemetry Medical ?Family Communication: Updated patient and daughter  at the bedside ? ? ?Disposition Plan:      Remains inpatient appropriate: Continue IV antibiotics ? ? ?Procedures:  ?MRI left foot ? ?Consultants:   ?Podiatry ? ?Antimicrobials:  ?IV cefepime 08/09/2021-->  ?IV vancomycin 08/09/2021-->  ?IV Flagyl 08/08/2021-->  ? ? ?Medications ? vitamin C  250 mg Oral BID  ? carvedilol  3.125 mg Oral BID WC  ? ezetimibe  10 mg Oral Daily  ? ferrous sulfate  325 mg Oral BID  ? furosemide  20 mg Oral BID  ? insulin aspart  0-9 Units Subcutaneous TID WC  ? ipratropium-albuterol  3 mL Nebulization TID  ? leptospermum manuka honey  1 application. Topical Daily  ? linagliptin  5 mg Oral Daily  ? melatonin  5 mg Oral  QHS  ? metroNIDAZOLE  500 mg Oral Q12H  ? multivitamin with minerals  1 tablet Oral Daily  ? Ensure Max Protein  11 oz Oral Daily  ? zinc sulfate  220 mg Oral Daily  ? ? ? ? ?Subjective:  ? ?Shawn Blanchard was seen and examined today.  Having wheezing today bilaterally with some shortness of breath.  Still having significant swelling, erythema, wound on the dorsum  ? ?Objective:  ? ?Vitals:  ? 08/09/21 2104 08/10/21 0510 08/10/21 1030 08/10/21 1443  ?BP: (!) 167/72 (!) 169/73  (!) 142/72  ?Pulse: 84 74  82  ?Resp: '18 18  20  '$ ?Temp: 97.7 ?F (36.5 ?C) 97.8 ?F (36.6 ?C)  98 ?F (36.7 ?C)  ?TempSrc: Oral Oral  Oral  ?SpO2: 95% 96% 98% 96%  ?Weight:      ?Height:      ? ? ?Intake/Output Summary (Last 24 hours) at 08/10/2021 1607 ?Last data filed at 08/10/2021 1638 ?Gross per 24 hour  ?Intake 540 ml  ?Output --  ?Net 540 ml  ? ? ? ?Wt Readings from Last 3 Encounters:  ?08/08/21 113.4 kg  ?07/24/21 117.3 kg  ?06/29/21 115.7 kg  ? ?Physical Exam ?General: Alert and oriented x 3, NAD ?Cardiovascular: irregular, irregular   ?Respiratory: bilateral expiratory wheezing ?Gastrointestinal: Soft, nontender, nondistended, NBS ?Ext:  left foot edema ?Neuro: no new deficits ?Skin: left foot cellulitis with swelling, erythema, edema, ulceration ?Psych: Normal affect and demeanor, alert and oriented x3  ? ? ? ? ? ?Data Reviewed:  I have personally reviewed following labs  ? ? ?CBC ?Lab Results  ?Component Value Date  ? WBC 11.5 (H) 08/09/2021  ? RBC 3.91 (L) 08/09/2021  ? HGB 10.7 (L) 08/09/2021  ? HCT 33.7 (L) 08/09/2021  ? MCV 86.2 08/09/2021  ? MCH 27.4 08/09/2021  ? PLT 163 08/09/2021  ? MCHC 31.8 08/09/2021  ? RDW 16.5 (H) 08/09/2021  ? LYMPHSABS 0.9 08/08/2021  ? MONOABS 1.0 08/08/2021  ? EOSABS 0.1 08/08/2021  ? BASOSABS 0.0 08/08/2021  ? ? ? ?Last metabolic panel ?Lab Results  ?Component Value Date  ? NA 137 08/10/2021  ? K 3.5 08/10/2021  ? CL 103 08/10/2021  ? CO2 22 08/10/2021  ? BUN 30 (H) 08/10/2021  ? CREATININE 1.18  08/10/2021  ? GLUCOSE 149 (H) 08/10/2021  ? GFRNONAA >60 08/10/2021  ? GFRAA >60 12/17/2019  ? CALCIUM 8.9 08/10/2021  ? PHOS 2.8 08/08/2021  ? PROT 7.1 08/08/2021  ? ALBUMIN 4.1 08/08/2021  ? BILITOT 1.5 (H) 08/08/2021  ? ALKPHOS 50 08/08/2021  ? AST 15 08/08/2021  ? ALT 12 08/08/2021  ? ANIONGAP 12 08/10/2021  ? ? ?CBG (last 3)  ?Recent Labs  ?  08/09/21 ?2115 08/10/21 ?0725 08/10/21 ?1230  ?GLUCAP 180* 134* 113*  ?  ? ? ?Coagulation Profile: ?No results for input(s): INR, PROTIME in the last 168 hours. ? ? ?Radiology Studies: I have personally reviewed the imaging studies  ?MR FOOT LEFT W WO CONTRAST ? ?Result Date: 08/09/2021 ? IMPRESSION: 1. Soft tissue wound along the plantar aspect of the first MTP joint with surrounding cellulitis and a 10 mm abscess. Large first MTP joint effusion with synovitis. Periarticular erosion involving the medial aspect of the first metatarsal head. Differential considerations include septic arthritis of the first MTP joint versus inflammatory joint effusion secondary to underlying arthropathy such as gout. If there is further clinical concern, recommend arthrocentesis. 2. Mild edema in the medial hallux sesamoid and adjacent plantar aspect of the first metatarsal head likely reflecting arthritic changes. 3. Moderate osteoarthritis of the navicular-medial cuneiform joint with subchondral reactive marrow edema. 4. Severe edema of the foot which may be reactive secondary to fluid overload or venous insufficiency versus cellulitis. Electronically Signed   By: Kathreen Devoid M.D.   On: 08/09/2021 07:43  ? ?ECHOCARDIOGRAM COMPLETE ? ?Result Date: 08/09/2021 ? IMPRESSIONS  1. Left ventricular ejection fraction, by estimation, is 45 to 50%. The left ventricle has mildly decreased function. The left ventricle demonstrates global hypokinesis. There is moderate left ventricular hypertrophy. Left ventricular diastolic parameters are indeterminate.  2. Right ventricular systolic function is normal.  The right ventricular size is normal. There is moderately elevated pulmonary artery systolic pressure.  3. The mitral valve was not well visualized. No evidence of mitral valve regurgitation.  4. Tricuspid valve regurgita

## 2021-08-10 NOTE — Consult Note (Signed)
? ? ? ?[facility] for Infectious Disease   ? ?Date of Admission:  08/08/2021    ? ?Total days of antibiotics 3 ?        ?      ?Reason for Consult: Diabetic Foot Infection   ?Referring Provider: Dr. Tana Coast ?Primary Care Provider: Haydee Salter, MD ? ? ?ASSESSMENT: ? ?Shawn Blanchard is an 86 y/o male with left foot diabetic foot ulcer with 10 mm abscess. There is possibility of septic arthritis but does not appear to have any osteomyelitis. Podiatry planning for I&D of the abscess. Request cultures to be obtained during procedure although return may be limited as he has been on broad spectrum antibiotics. Continue vancomycin, cefepime and metronidazole. Plan of care discussed with Shawn Blanchard and family at bedside. Reminded of importance of blood sugar control and increased protein to optimize healing. Remaining medical and supportive care per primary team. ? ?PLAN: ? ?Continue current dose of vancomycin and cefepime with metronidazole pending culture results. ?Surgical intervention and wound care per Podiatry. ?Remaining medical and supportive care per primary team.  ? ?Dr. West Bali is available over the weekend for ID related questions/concerns.  ? ?Principal Problem: ?  Diabetic foot infection ([place]) ?Active Problems: ?  Chronic atrial fibrillation ([place]) ?  Coronary artery disease involving native coronary artery of native heart with angina pectoris ([place]) ?  Type 2 diabetes mellitus with neurologic complication, without long-term current use of insulin ([place]) ?  Hyperlipidemia ?  Pulmonary hypertension ([place]) ?  Essential hypertension ?  Acute on chronic systolic CHF (congestive heart failure) ([place]) ?  Stage 3a chronic kidney disease ([place]) ?  Aortic atherosclerosis ([place]) ?  Class 1 obesity ? ? ? vitamin C  250 mg Oral BID  ? carvedilol  3.125 mg Oral BID WC  ? ezetimibe  10 mg Oral Daily  ? ferrous sulfate  325 mg Oral BID  ? furosemide  20 mg Oral BID  ? insulin aspart  0-9 Units Subcutaneous TID WC  ?  leptospermum manuka honey  1 application. Topical Daily  ? linagliptin  5 mg Oral Daily  ? melatonin  5 mg Oral QHS  ? metroNIDAZOLE  500 mg Oral Q12H  ? multivitamin with minerals  1 tablet Oral Daily  ? Ensure Max Protein  11 oz Oral Daily  ? zinc sulfate  220 mg Oral Daily  ? ? ? ?HPI: Shawn Blanchard is a 86 y.o. male with previous medical history as detailed below and significant for Type 2 diabetes complicated by neuropathy, hypertensive heart disease with heart failure, adenocarcinoma of the small bowel admitted with worsening left foot diabetic wound concerning for infection and shortness of breath. ? ?Shawn Blanchard has been followed by Podiatry for left foot ulcer and was last seen on 08/02/21 with debridement and no indication of infection. On 5/3 went to the ED with worsening redness of his left foot and shortness of breath after sustaining a fall earlier in the morning. While in the ED noted to have a substantial change to his left foot presenting with surrounding erythema, edema, and an area of purulent collection. Chest x-ray with minimal right basilar atelectasis. MRI left foot with soft tissue wound along plantar aspect of the first MTP joint with surrounding cellulitis and a 10 mm abscess and large first MTP joint effusion with synovitis concerning for septic arthritis.  ? ?Shawn Blanchard has been afebrile since admission with WBC count of 11.5. Currently on Day 3 of antibiotics  with current regimen of vancomycin and cefepime. Podiatry planning for I&D. Most recent A1c of 6.9. ID consulted for antibiotic recommendations.  ? ? ? ?Review of Systems: ?Review of Systems  ?Constitutional:  Negative for chills, fever and weight loss.  ?Respiratory:  Negative for cough, shortness of breath and wheezing.   ?Cardiovascular:  Negative for chest pain and leg swelling.  ?Gastrointestinal:  Negative for abdominal pain, constipation, diarrhea, nausea and vomiting.  ?Skin:  Negative for rash.  ? ? ?Past Medical  History:  ?Diagnosis Date  ? Acute on chronic systolic congestive heart failure ([place]) 11/07/2017  ? 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 Arthritis 03/12/2016  ? Bilateral hand pain 03/25/2019  ? Blepharitis of both upper and lower eyelid 10/02/2018  ? Carpal tunnel syndrome of right wrist 03/25/2019  ? Chronic anticoagulation 10/11/2016  ? Chronic atrial fibrillation ([place]) 06/12/2015  ? Overview:  Managed CARDS  ? Chronic diastolic heart failure ([place]) 09/01/2015  ? Coronary artery disease involving native coronary artery of native heart with angina pectoris ([place]) 02/15/2015  ? Overview:  Hx of MI with stent to LAD 2002 MPS 2016 with EF 47% and no ischemia  ? Dermatochalasis of both upper eyelids 11/21/2016  ? Dermatochalasis of right upper eyelid 10/02/2018  ? Diabetic polyneuropathy associated with type 2 diabetes mellitus ([place]) 12/16/2018  ? 2020  ? Dyslipidemia 11/07/2017  ? Essential hypertension 11/07/2017  ? Ganglion cyst 11/27/2015  ? Overview:  2017: dorsum right hand  ? Gastroesophageal reflux disease without esophagitis 06/12/2015  ? Hyperlipidemia 04/11/2016  ? Hypertensive heart disease with heart failure ([place]) 02/15/2015  ? Idiopathic chronic gout 05/17/2015  ? Insomnia 11/27/2015  ? Leukocytosis 04/11/2016  ? Lightheadedness 10/11/2016  ? Long term current use of oral hypoglycemic drug 10/02/2018  ? 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 Male erectile disorder 06/17/2016  ? Meibomian gland dysfunction (MGD) of both eyes 10/02/2018  ? MI (myocardial infarction) ([place]) 02/15/2015  ? Overview:  2002  ? Obesity 04/11/2016  ? 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 exacerbation ([place]) 06/12/2015  ? Obstructive sleep apnea 08/30/2015  ? Overview:  CPAP  ? Old MI (myocardial infarction)   ? Orthostatic dizziness 09/15/2017  ? 2018: med related  ? Permanent atrial fibrillation ([place]) 06/12/2015  ? Overview:  Managed CARDS  ? Pseudophakia of both eyes 03/12/2016  ? Pulmonary hypertension ([place]) 08/30/2015  ? Overview:  Managed CARDS  ?  Purulent inflammation of skin 12/29/2018  ? Restless leg syndrome 08/30/2015  ? Restrictive lung disease 12/26/2016  ? Overview:  2018: mod on spirometry  ? SBO (small bowel obstruction) ([place]) 12/14/2019  ? Senile nuclear sclerosis 05/02/2016  ? Small bowel obstruction ([place]) 04/11/2016  ? SOB (shortness of breath) 08/14/2015  ? Type 2 diabetes mellitus with neurologic complication, without long-term current use of insulin ([place]) 12/02/2014  ? Unilateral inguinal hernia without obstruction or gangrene 06/12/2015  ? ? ?Social History  ? ?Tobacco Use  ? Smoking status: Never  ? Smokeless tobacco: Never  ?Vaping Use  ? Vaping Use: Never used  ?Substance Use Topics  ? Alcohol use: Not Currently  ? Drug use: Never  ? ? ?Family History  ?Problem Relation Age of Onset  ? Cancer Mother   ?     Ovarian  ? Diabetes Mother   ? Heart disease Mother   ? Heart disease Father   ? Stroke Father   ? Diabetes Father   ? Diabetes Sister   ? Diabetes Brother   ?  Diabetes Brother   ? Cancer Paternal Grandfather   ?     Prostate  ? Colon cancer Neg Hx   ? Rectal cancer Neg Hx   ? Pancreatic cancer Neg Hx   ? Liver cancer Neg Hx   ? Stomach cancer Neg Hx   ? ? ?Allergies  ?Allergen Reactions  ? Atorvastatin Other (See Comments)  ?  Dizziness.  ? Jardiance [Empagliflozin] Other (See Comments)  ?  Orthostasis  ? Ace Inhibitors Rash  ? Amoxicillin Nausea Only  ? Meperidine Rash  ? Meperidine Hcl Rash  ? Naproxen Sodium Rash  ? Sulfa Antibiotics Nausea Only  ? Sulfamethoxazole Nausea Only  ? Tetracycline Nausea Only  ? ? ?OBJECTIVE: ?Blood pressure (!) 169/73, pulse 74, temperature 97.8 ?F (36.6 ?C), temperature source Oral, resp. rate 18, height '5\' 11"'$  (1.803 m), weight 113.4 kg, SpO2 98 %. ? ?Physical Exam ?Constitutional:   ?   General: He is not in acute distress. ?   Appearance: He is well-developed.  ?   Comments: Lying in bed with head of bed elevated; pleasant.  ?Cardiovascular:  ?   Rate and Rhythm: Normal rate and regular rhythm.  ?   Heart  sounds: Normal heart sounds.  ?Pulmonary:  ?   Effort: Pulmonary effort is normal.  ?   Breath sounds: Normal breath sounds.  ?Skin: ?   General: Skin is warm and dry.  ?Neurological:  ?   Mental Status: He is alert and ori

## 2021-08-10 NOTE — Progress Notes (Signed)
?  Subjective:  ?Patient ID: Shawn Blanchard, male    DOB: 1936-02-19,  MRN: [id number] ? ?Seen at bedside  resting comfortably. ? ?Negative for chest pain and shortness of breath ?Fever: no ?Constitutional signs: no ?Review of all other systems is negative ?Objective:  ? ?Vitals:  ? 08/10/21 1030 08/10/21 1443  ?BP:  (!) 142/72  ?Pulse:  82  ?Resp:  20  ?Temp:  98 ?F (36.7 ?C)  ?SpO2: 98% 96%  ? ?General AA&O x3. Normal mood and affect.  ?Vascular Dorsalis pedis and posterior tibial pulses 2/4 bilat. ?Brisk capillary refill to all digits. Pedal hair present.  ?Neurologic Epicritic sensation grossly reduced.  ?Dermatologic Left submet 1 ulcer. Unchanged cellulitis  ?Orthopedic: MMT 5/5 in dorsiflexion, plantarflexion, inversion, and eversion. ?Normal joint ROM without pain or crepitus.  ? ? ?Assessment & Plan:  ?Patient was evaluated and treated and all questions answered. ? ?Cellulitis and abscess, diabetic foot infection with ulcer of left foot ?-Discussed his progress and MRI results. We reviewed that there is no bone infection but there is an abscess present. I also discussed with his daughter. We reviewed the rational behind I&D, also discussed if the sesamoids are adjacent will reduce if prominent. Plan for surgery tomorrow NPO past midnight ? ?Criselda Peaches, DPM ? ?Accessible via secure chat for questions or concerns. ? ?

## 2021-08-11 ENCOUNTER — Encounter (HOSPITAL_COMMUNITY)
Admission: EM | Disposition: A | Discharge status: Home Health | Payer: Self-pay | Source: Home / Self Care | Attending: Internal Medicine

## 2021-08-11 ENCOUNTER — Encounter (HOSPITAL_COMMUNITY): Payer: Self-pay | Admitting: Internal Medicine

## 2021-08-11 ENCOUNTER — Inpatient Hospital Stay (HOSPITAL_COMMUNITY): Payer: Medicare PPO | Admitting: Certified Registered Nurse Anesthetist

## 2021-08-11 ENCOUNTER — Inpatient Hospital Stay (HOSPITAL_COMMUNITY): Payer: Medicare PPO

## 2021-08-11 DIAGNOSIS — L97522 Non-pressure chronic ulcer of other part of left foot with fat layer exposed: Secondary | ICD-10-CM

## 2021-08-11 DIAGNOSIS — M899 Disorder of bone, unspecified: Secondary | ICD-10-CM | POA: Diagnosis not present

## 2021-08-11 DIAGNOSIS — M109 Gout, unspecified: Secondary | ICD-10-CM

## 2021-08-11 DIAGNOSIS — I2510 Atherosclerotic heart disease of native coronary artery without angina pectoris: Secondary | ICD-10-CM

## 2021-08-11 DIAGNOSIS — I5023 Acute on chronic systolic (congestive) heart failure: Secondary | ICD-10-CM | POA: Diagnosis not present

## 2021-08-11 DIAGNOSIS — I509 Heart failure, unspecified: Secondary | ICD-10-CM | POA: Diagnosis not present

## 2021-08-11 DIAGNOSIS — E11628 Type 2 diabetes mellitus with other skin complications: Secondary | ICD-10-CM | POA: Diagnosis not present

## 2021-08-11 DIAGNOSIS — M10472 Other secondary gout, left ankle and foot: Secondary | ICD-10-CM | POA: Diagnosis not present

## 2021-08-11 DIAGNOSIS — I700 Atherosclerosis of aorta: Secondary | ICD-10-CM | POA: Diagnosis not present

## 2021-08-11 DIAGNOSIS — L089 Local infection of the skin and subcutaneous tissue, unspecified: Secondary | ICD-10-CM | POA: Diagnosis not present

## 2021-08-11 DIAGNOSIS — M25475 Effusion, left foot: Secondary | ICD-10-CM

## 2021-08-11 DIAGNOSIS — L02612 Cutaneous abscess of left foot: Secondary | ICD-10-CM

## 2021-08-11 HISTORY — PX: IRRIGATION AND DEBRIDEMENT FOOT: SHX6602

## 2021-08-11 LAB — CBC
HCT: 34 % — ABNORMAL LOW (ref 39.0–52.0)
Hemoglobin: 10.7 g/dL — ABNORMAL LOW (ref 13.0–17.0)
MCH: 27 pg (ref 26.0–34.0)
MCHC: 31.5 g/dL (ref 30.0–36.0)
MCV: 85.9 fL (ref 80.0–100.0)
Platelets: 194 10*3/uL (ref 150–400)
RBC: 3.96 MIL/uL — ABNORMAL LOW (ref 4.22–5.81)
RDW: 16.1 % — ABNORMAL HIGH (ref 11.5–15.5)
WBC: 7.8 10*3/uL (ref 4.0–10.5)
nRBC: 0 % (ref 0.0–0.2)

## 2021-08-11 LAB — GLUCOSE, CAPILLARY
Glucose-Capillary: 131 mg/dL — ABNORMAL HIGH (ref 70–99)
Glucose-Capillary: 123 mg/dL — ABNORMAL HIGH (ref 70–99)
Glucose-Capillary: 249 mg/dL — ABNORMAL HIGH (ref 70–99)
Glucose-Capillary: 247 mg/dL — ABNORMAL HIGH (ref 70–99)

## 2021-08-11 LAB — BASIC METABOLIC PANEL
Anion gap: 13 (ref 5–15)
BUN: 33 mg/dL — ABNORMAL HIGH (ref 8–23)
CO2: 22 mmol/L (ref 22–32)
Calcium: 8.9 mg/dL (ref 8.9–10.3)
Chloride: 102 mmol/L (ref 98–111)
Creatinine, Ser: 1.29 mg/dL — ABNORMAL HIGH (ref 0.61–1.24)
GFR, Estimated: 55 mL/min — ABNORMAL LOW (ref >60)
Glucose, Bld: 122 mg/dL — ABNORMAL HIGH (ref 70–99)
Potassium: 3.5 mmol/L (ref 3.5–5.1)
Sodium: 137 mmol/L (ref 135–145)

## 2021-08-11 LAB — BRAIN NATRIURETIC PEPTIDE: B Natriuretic Peptide: 459.5 pg/mL — ABNORMAL HIGH (ref 0.0–100.0)

## 2021-08-11 SURGERY — IRRIGATION AND DEBRIDEMENT FOOT
Anesthesia: Monitor Anesthesia Care | Site: Foot | Laterality: Left

## 2021-08-11 MED ORDER — COLCHICINE 0.6 MG PO TABS
1.2000 mg | ORAL_TABLET | Freq: Once | ORAL | Status: AC
  Administered 2021-08-11: 1.2 mg via ORAL
  Filled 2021-08-11: qty 2

## 2021-08-11 MED ORDER — PREDNISONE 20 MG PO TABS
40.0000 mg | ORAL_TABLET | Freq: Every day | ORAL | Status: DC
  Administered 2021-08-12: 40 mg via ORAL
  Filled 2021-08-11: qty 2

## 2021-08-11 MED ORDER — DEXAMETHASONE SODIUM PHOSPHATE 10 MG/ML IJ SOLN
INTRAMUSCULAR | Status: DC | PRN
  Administered 2021-08-11: 10 mg via INTRAVENOUS

## 2021-08-11 MED ORDER — BUDESONIDE 0.25 MG/2ML IN SUSP
0.2500 mg | Freq: Two times a day (BID) | RESPIRATORY_TRACT | Status: DC
Start: 2021-08-11 — End: 2021-08-12
  Administered 2021-08-11 – 2021-08-12 (×3): 0.25 mg via RESPIRATORY_TRACT
  Filled 2021-08-11 (×2): qty 2

## 2021-08-11 MED ORDER — SODIUM CHLORIDE 0.9 % IR SOLN
Status: DC | PRN
  Administered 2021-08-11: 3000 mL

## 2021-08-11 MED ORDER — PSYLLIUM 95 % PO PACK
1.0000 | PACK | Freq: Every day | ORAL | Status: DC
Start: 2021-08-11 — End: 2021-08-12
  Administered 2021-08-11 – 2021-08-12 (×2): 1 via ORAL
  Filled 2021-08-11 (×2): qty 1

## 2021-08-11 MED ORDER — FUROSEMIDE 10 MG/ML IJ SOLN
20.0000 mg | Freq: Two times a day (BID) | INTRAMUSCULAR | Status: DC
  Administered 2021-08-11 – 2021-08-12 (×3): 20 mg via INTRAVENOUS
  Filled 2021-08-11 (×3): qty 2

## 2021-08-11 MED ORDER — DEXAMETHASONE SODIUM PHOSPHATE 10 MG/ML IJ SOLN
INTRAMUSCULAR | Status: AC
  Filled 2021-08-11: qty 1

## 2021-08-11 MED ORDER — LIDOCAINE HCL (CARDIAC) PF 100 MG/5ML IV SOSY
PREFILLED_SYRINGE | INTRAVENOUS | Status: DC | PRN
  Administered 2021-08-11: 40 mg via INTRAVENOUS

## 2021-08-11 MED ORDER — PROPOFOL 500 MG/50ML IV EMUL
INTRAVENOUS | Status: DC | PRN
  Administered 2021-08-11: 50 ug/kg/min via INTRAVENOUS

## 2021-08-11 MED ORDER — BUPIVACAINE-EPINEPHRINE (PF) 0.25% -1:200000 IJ SOLN
INTRAMUSCULAR | Status: AC
  Filled 2021-08-11: qty 30

## 2021-08-11 MED ORDER — INSULIN ASPART 100 UNIT/ML IJ SOLN
0.0000 [IU] | Freq: Every day | INTRAMUSCULAR | Status: DC
  Administered 2021-08-11: 3 [IU] via SUBCUTANEOUS

## 2021-08-11 MED ORDER — OXYCODONE HCL 5 MG PO TABS: 5.0000 mg | ORAL_TABLET | Freq: Once | ORAL | Status: DC | PRN

## 2021-08-11 MED ORDER — INSULIN ASPART 100 UNIT/ML IJ SOLN
0.0000 [IU] | Freq: Three times a day (TID) | INTRAMUSCULAR | Status: DC
  Administered 2021-08-11 – 2021-08-12 (×2): 5 [IU] via SUBCUTANEOUS
  Administered 2021-08-12: 8 [IU] via SUBCUTANEOUS

## 2021-08-11 MED ORDER — BUPIVACAINE-EPINEPHRINE 0.25% -1:200000 IJ SOLN
INTRAMUSCULAR | Status: DC | PRN
Start: 2021-08-11 — End: 2021-08-11
  Administered 2021-08-11: 10 mL

## 2021-08-11 MED ORDER — VANCOMYCIN HCL 1000 MG IV SOLR
INTRAVENOUS | Status: DC | PRN
  Administered 2021-08-11: 1250 mg via INTRAVENOUS

## 2021-08-11 MED ORDER — DOCUSATE SODIUM 100 MG PO CAPS
100.0000 mg | ORAL_CAPSULE | Freq: Two times a day (BID) | ORAL | Status: DC
  Administered 2021-08-11 – 2021-08-12 (×3): 100 mg via ORAL
  Filled 2021-08-11 (×3): qty 1

## 2021-08-11 MED ORDER — IPRATROPIUM-ALBUTEROL 0.5-2.5 (3) MG/3ML IN SOLN
3.0000 mL | RESPIRATORY_TRACT | Status: DC
Start: 2021-08-11 — End: 2021-08-12
  Administered 2021-08-11 (×4): 3 mL via RESPIRATORY_TRACT
  Filled 2021-08-11 (×3): qty 3

## 2021-08-11 MED ORDER — COLCHICINE 0.6 MG PO TABS
0.6000 mg | ORAL_TABLET | Freq: Every day | ORAL | Status: DC
  Administered 2021-08-11 – 2021-08-12 (×2): 0.6 mg via ORAL
  Filled 2021-08-11 (×2): qty 1

## 2021-08-11 MED ORDER — OXYCODONE HCL 5 MG/5ML PO SOLN: 5.0000 mg | Freq: Once | ORAL | Status: DC | PRN

## 2021-08-11 MED ORDER — LACTATED RINGERS IV SOLN
INTRAVENOUS | Status: DC | PRN
Start: 2021-08-11 — End: 2021-08-11

## 2021-08-11 MED ORDER — ACETAMINOPHEN 500 MG PO TABS
1000.0000 mg | ORAL_TABLET | Freq: Once | ORAL | Status: DC
Start: 2021-08-11 — End: 2021-08-11

## 2021-08-11 MED ORDER — METHYLPREDNISOLONE SODIUM SUCC 125 MG IJ SOLR
125.0000 mg | Freq: Once | INTRAMUSCULAR | Status: AC
  Administered 2021-08-11: 125 mg via INTRAVENOUS
  Filled 2021-08-11: qty 2

## 2021-08-11 MED ORDER — FENTANYL CITRATE PF 50 MCG/ML IJ SOSY: 25.0000 ug | PREFILLED_SYRINGE | INTRAMUSCULAR | Status: DC | PRN

## 2021-08-11 MED ORDER — 0.9 % SODIUM CHLORIDE (POUR BTL) OPTIME
TOPICAL | Status: DC | PRN
  Administered 2021-08-11: 1000 mL

## 2021-08-11 SURGICAL SUPPLY — 65 items
APL PRP STRL LF DISP 70% ISPRP (MISCELLANEOUS) ×1
BAG COUNTER SPONGE SURGICOUNT (BAG) ×2 IMPLANT
BAG SPNG CNTER NS LX DISP (BAG) ×1
BLADE AVERAGE 25X9 (BLADE) ×1 IMPLANT
BLADE SURG 15 STRL LF DISP TIS (BLADE) ×1 IMPLANT
BLADE SURG 15 STRL SS (BLADE) ×2
BNDG CMPR 9X4 STRL LF SNTH (GAUZE/BANDAGES/DRESSINGS) ×1
BNDG ELASTIC 4X5.8 VLCR STR LF (GAUZE/BANDAGES/DRESSINGS) ×2 IMPLANT
BNDG ELASTIC 6X5.8 VLCR STR LF (GAUZE/BANDAGES/DRESSINGS) ×1 IMPLANT
BNDG ESMARK 4X9 LF (GAUZE/BANDAGES/DRESSINGS) ×1 IMPLANT
BNDG GAUZE ELAST 4 BULKY (GAUZE/BANDAGES/DRESSINGS) ×2 IMPLANT
CHLORAPREP W/TINT 26 (MISCELLANEOUS) ×1 IMPLANT
CNTNR URN SCR LID CUP LEK RST (MISCELLANEOUS) IMPLANT
CONT SPEC 4OZ STRL OR WHT (MISCELLANEOUS)
CONT SPEC DRY SEAL-RITE 20ML (COLLECTOR) ×1 IMPLANT
COVER BACK TABLE 60X90IN (DRAPES) ×2 IMPLANT
CUFF TOURN SGL QUICK 18X4 (TOURNIQUET CUFF) IMPLANT
DRAPE 3/4 80X56 (DRAPES) ×2 IMPLANT
DRAPE EXTREMITY T 121X128X90 (DISPOSABLE) ×2 IMPLANT
DRAPE SHEET LG 3/4 BI-LAMINATE (DRAPES) ×2 IMPLANT
DRAPE U-SHAPE 47X51 STRL (DRAPES) ×2 IMPLANT
DRSG PAD ABDOMINAL 8X10 ST (GAUZE/BANDAGES/DRESSINGS) ×1 IMPLANT
ELECT REM PT RETURN 15FT ADLT (MISCELLANEOUS) ×2 IMPLANT
GAUZE SPONGE 4X4 12PLY STRL (GAUZE/BANDAGES/DRESSINGS) ×2 IMPLANT
GAUZE XEROFORM 1X8 LF (GAUZE/BANDAGES/DRESSINGS) ×1 IMPLANT
GLOVE BIO SURGEON STRL SZ7 (GLOVE) ×2 IMPLANT
GLOVE BIO SURGEON STRL SZ7.5 (GLOVE) ×1 IMPLANT
GLOVE BIOGEL PI IND STRL 7.0 (GLOVE) IMPLANT
GLOVE BIOGEL PI IND STRL 7.5 (GLOVE) IMPLANT
GLOVE BIOGEL PI IND STRL 8 (GLOVE) ×1 IMPLANT
GLOVE BIOGEL PI INDICATOR 7.0 (GLOVE) ×1
GLOVE BIOGEL PI INDICATOR 7.5 (GLOVE) ×2
GLOVE BIOGEL PI INDICATOR 8 (GLOVE)
GLOVE ECLIPSE 7.0 STRL STRAW (GLOVE) ×1 IMPLANT
GLOVE SKINSENSE NS SZ7.0 (GLOVE) ×1
GLOVE SKINSENSE STRL SZ7.0 (GLOVE) IMPLANT
GOWN STRL REUS W/ TWL XL LVL3 (GOWN DISPOSABLE) ×1 IMPLANT
GOWN STRL REUS W/TWL XL LVL3 (GOWN DISPOSABLE) ×4
KIT BASIN OR (CUSTOM PROCEDURE TRAY) ×2 IMPLANT
KIT TURNOVER KIT A (KITS) ×1 IMPLANT
MANIFOLD NEPTUNE II (INSTRUMENTS) ×2 IMPLANT
NDL HYPO 25X1 1.5 SAFETY (NEEDLE) ×1 IMPLANT
NEEDLE HYPO 25X1 1.5 SAFETY (NEEDLE) ×2 IMPLANT
PADDING CAST ABS 4INX4YD NS (CAST SUPPLIES) ×1
PADDING CAST ABS COTTON 4X4 ST (CAST SUPPLIES) ×1 IMPLANT
SOL PREP POV-IOD 4OZ 10% (MISCELLANEOUS) ×1 IMPLANT
SPONGE T-LAP 4X18 ~~LOC~~+RFID (SPONGE) ×2 IMPLANT
STAPLER VISISTAT 35W (STAPLE) IMPLANT
STOCKINETTE 6  STRL (DRAPES) ×2
STOCKINETTE 6 STRL (DRAPES) ×1 IMPLANT
SUCTION FRAZIER HANDLE 10FR (MISCELLANEOUS)
SUCTION TUBE FRAZIER 10FR DISP (MISCELLANEOUS) IMPLANT
SUT ETHILON 3 0 PS 1 (SUTURE) ×1 IMPLANT
SUT ETHILON 4 0 PS 2 18 (SUTURE) IMPLANT
SUT MNCRL AB 3-0 PS2 18 (SUTURE) ×1 IMPLANT
SUT MNCRL AB 4-0 PS2 18 (SUTURE) IMPLANT
SUT VIC AB 2-0 SH 27 (SUTURE)
SUT VIC AB 2-0 SH 27XBRD (SUTURE) IMPLANT
SWAB COLLECTION DEVICE MRSA (MISCELLANEOUS) ×2 IMPLANT
SWAB CULTURE ESWAB REG 1ML (MISCELLANEOUS) ×2 IMPLANT
SYR BULB EAR ULCER 3OZ GRN STR (SYRINGE) ×2 IMPLANT
SYR CONTROL 10ML LL (SYRINGE) ×2 IMPLANT
TUBE IRRIGATION SET MISONIX (TUBING) IMPLANT
UNDERPAD 30X36 HEAVY ABSORB (UNDERPADS AND DIAPERS) ×2 IMPLANT
YANKAUER SUCT BULB TIP NO VENT (SUCTIONS) ×2 IMPLANT

## 2021-08-11 NOTE — Anesthesia Postprocedure Evaluation (Signed)
Anesthesia Post Note ? ?Patient: Shawn Blanchard ? ?Procedure(s) Performed: IRRIGATION AND DEBRIDEMENT FOOT, AND BONE BIOPSY, PARTIAL SESMOIDECTOMY (Left: Foot) ? ?  ? ?Patient location during evaluation: PACU ?Anesthesia Type: MAC ?Level of consciousness: awake and alert ?Pain management: pain level controlled ?Vital Signs Assessment: post-procedure vital signs reviewed and stable ?Respiratory status: spontaneous breathing, nonlabored ventilation and respiratory function stable ?Cardiovascular status: blood pressure returned to baseline ?Postop Assessment: no apparent nausea or vomiting ?Anesthetic complications: no ? ? ?No notable events documented. ? ?Last Vitals:  ?Vitals:  ? 08/11/21 1208 08/11/21 1215  ?BP:  (!) 153/60  ?Pulse: 62 91  ?Resp:    ?Temp:    ?SpO2: 100% 100%  ?  ?Last Pain:  ?Vitals:  ? 08/11/21 1215  ?TempSrc:   ?PainSc: 0-No pain  ? ? ?  ?  ?  ?  ?  ?  ? ?Marthenia Rolling ? ? ? ? ?

## 2021-08-11 NOTE — Progress Notes (Signed)
?      ? ? ? [facility] ?                                                                           ? ? ?Shawn Blanchard, is a 86 y.o. male, DOB - [birth year], OVF:[id number] ?Admit date - 08/08/2021    ?Outpatient Primary MD for the patient is [name], [name], MD ? ?LOS - 3  days ? ?Chief Complaint  ?Patient presents with  ? Shortness of Breath  ?    ? ?Brief summary  ? ?Patient is a 86 year old male with history of chronic systolic CHF, adenocarcinoma small bowel, lymphoma small intestine, history of multiple SBO's, chronic A-fib on Pradaxa, CAD, diabetic polyneuropathy, GERD, hyperlipidemia, chronic gout, pulmonary hypertension, restrictive lung disease, diabetes mellitus type 2 presented to ED after having a fall in the bathroom.  Patient also reported recent dyspnea associated with lower extremity edema, occasional nonproductive cough.  No chest pain, orthopnea, PND or palpitations.  He also reported frequent postural dizziness, frequently adds salt to his meals.  Patient also noted to have left foot swelling with surrounding erythema, area with purulent collection, tenderness. ?Left foot x-ray did not show acute osteomyelitis chest x-ray showed cardiomegaly with minimal bibasilar atelectasis. ? ? ?Assessment & Plan  ? ? ?Principal Problem: ?  Diabetic foot infection ([place]) ?-Following podiatry, Dr. Blenda Mounts, has ulceration of the left foot, not fully healed, worsened over the last 2 to 3 days with pain, swelling and unable to bear weight ?-Continue IV vancomycin, cefepime and Flagyl ?-MRI showed soft tissue wound along the plantar aspect of the first MTP joint with surrounding cellulitis and a 10 mm abscess.  Large first MTP joint effusion with synovitis.  Periarticular erosion involving the medial aspect of first metatarsal head.  No acute osteomyelitis ?-Underwent I&D in OR today by podiatry.  Per Dr. Sherryle Lis, there was no pus or abscess, no appear to be acute gout flare or tophi.  Cultures were taken,  recommended to treat for acute gout flare ? ?Acute gout flare left foot ?-Placed on colchicine, has received Solu-Medrol IV 125 mg x 1, will place on p.o. prednisone ? ? ?  Chronic atrial fibrillation (HCC) ?-Currently rate controlled, continue Coreg ?-Per Dr. Sherryle Lis, okay to resume Pradaxa in a.m. ? ?Acute on chronic systolic CHF ?-Received Lasix 40 mg IV x1 in ED, resumed oral Lasix 20 mg twice daily ?-Continue Coreg ?-2D echo showed EF of 45 to 50% with global hypokinesis ?-Still wheezing today, BNP 459, chest x-ray did not show acute pulmonary edema.  Placed on Lasix 20 mg IV twice daily  ?-Added scheduled DuoNebs every 4 hours, Pulmicort.  Prednisone should help as well ? ?  Coronary artery disease involving native coronary artery of native heart with angina pectoris ([place]) ?-Currently no acute chest pain,  ?-Continue Coreg, Lasix, Zetia ? ? ?  Type 2 diabetes mellitus with neurologic complication, without long-term current use of insulin ([place]) ?-Hemoglobin A1c 6.9 on 06/22/2021 ?- continue Tradjenta.  Hold metformin ?-CBGs expected to rise with steroids, continue sliding scale insulin.  Will change to moderate. ? ?  Hyperlipidemia ?-Continue Zetia ?   ?CKD, stage IIIa ?-Creatinine currently at baseline, 1.2-1.3 ?-  Creatinine 1.29, follow closely with IV Lasix. ? ?Obesity, class I ?Estimated body mass index is 34.87 kg/m? as calculated from the following: ?  Height as of this encounter: '5\' 11"'$  (1.803 m). ?  Weight as of this encounter: 113.4 kg. ? ?Code Status: Full CODE STATUS ?DVT Prophylaxis:  SCDs Start: 08/09/21 1000 ? ? ?Level of Care: Level of care: Telemetry Medical ?Family Communication: Updated patient's wife and 2 daughters ? ? ?Disposition Plan:      Remains inpatient appropriate: Continue IV antibiotics ? ? ?Procedures:  ?MRI left foot ?I&D left foot 5/6 ? ?Consultants:   ?Podiatry ?Infectious ease ? ?Antimicrobials:  ?IV cefepime 08/09/2021-->  ?IV vancomycin 08/09/2021-->  ?IV Flagyl 08/08/2021-->   ? ? ?Medications ? vitamin C  250 mg Oral BID  ? budesonide (PULMICORT) nebulizer solution  0.25 mg Nebulization BID  ? carvedilol  3.125 mg Oral BID WC  ? colchicine  0.6 mg Oral Daily  ? docusate sodium  100 mg Oral BID  ? ezetimibe  10 mg Oral Daily  ? ferrous sulfate  325 mg Oral BID  ? furosemide  20 mg Intravenous Q12H  ? insulin aspart  0-9 Units Subcutaneous TID WC  ? ipratropium-albuterol  3 mL Nebulization Q4H  ? leptospermum manuka honey  1 application. Topical Daily  ? linagliptin  5 mg Oral Daily  ? melatonin  5 mg Oral QHS  ? metroNIDAZOLE  500 mg Oral Q12H  ? multivitamin with minerals  1 tablet Oral Daily  ? [START ON 08/12/2021] predniSONE  40 mg Oral QAC breakfast  ? Ensure Max Protein  11 oz Oral Daily  ? psyllium  1 packet Oral Daily  ? zinc sulfate  220 mg Oral Daily  ? ? ? ? ?Subjective:  ? ?Shawn Blanchard was seen and examined today.  Still having wheezing bilaterally, no acute chest pain, abdominal pain, nausea or vomiting.  No fevers or cough.  ? ?Objective:  ? ?Vitals:  ? 08/11/21 1230 08/11/21 1240 08/11/21 1315 08/11/21 1440  ?BP: (!) 153/76 (!) 175/91 (!) 157/66   ?Pulse:  75 67   ?Resp:   20   ?Temp:  97.6 ?F (36.4 ?C) (!) 97.5 ?F (36.4 ?C)   ?TempSrc:   Oral   ?SpO2:  100% 96% 96%  ?Weight:      ?Height:      ? ? ?Intake/Output Summary (Last 24 hours) at 08/11/2021 1506 ?Last data filed at 08/11/2021 1150 ?Gross per 24 hour  ?Intake 440 ml  ?Output 310 ml  ?Net 130 ml  ? ? ? ?Wt Readings from Last 3 Encounters:  ?08/08/21 113.4 kg  ?07/24/21 117.3 kg  ?06/29/21 115.7 kg  ? ?Physical Exam ?General: Alert and oriented x 3, NAD ?Cardiovascular: S1 S2 clear, RRR ?Respiratory: Audible wheezing, bilateral expiratory wheezing ?Gastrointestinal: Soft, nontender, nondistended, NBS ?Ext: left foot edema ?Neuro: no new deficits ?Skin: Left foot cellulitis with swelling, erythema, edema ?Psych: Normal affect and demeanor, alert and oriented x3  ? ? ? ? ?Data Reviewed:  I have personally reviewed  following labs  ? ? ?CBC ?Lab Results  ?Component Value Date  ? WBC 7.8 08/11/2021  ? RBC 3.96 (L) 08/11/2021  ? HGB 10.7 (L) 08/11/2021  ? HCT 34.0 (L) 08/11/2021  ? MCV 85.9 08/11/2021  ? MCH 27.0 08/11/2021  ? PLT 194 08/11/2021  ? MCHC 31.5 08/11/2021  ? RDW 16.1 (H) 08/11/2021  ? LYMPHSABS 0.9 08/08/2021  ? MONOABS 1.0 08/08/2021  ? EOSABS 0.1  08/08/2021  ? BASOSABS 0.0 08/08/2021  ? ? ? ?Last metabolic panel ?Lab Results  ?Component Value Date  ? NA 137 08/11/2021  ? K 3.5 08/11/2021  ? CL 102 08/11/2021  ? CO2 22 08/11/2021  ? BUN 33 (H) 08/11/2021  ? CREATININE 1.29 (H) 08/11/2021  ? GLUCOSE 122 (H) 08/11/2021  ? GFRNONAA 55 (L) 08/11/2021  ? GFRAA >60 12/17/2019  ? CALCIUM 8.9 08/11/2021  ? PHOS 2.8 08/08/2021  ? PROT 7.1 08/08/2021  ? ALBUMIN 4.1 08/08/2021  ? BILITOT 1.5 (H) 08/08/2021  ? ALKPHOS 50 08/08/2021  ? AST 15 08/08/2021  ? ALT 12 08/08/2021  ? ANIONGAP 13 08/11/2021  ? ? ?CBG (last 3)  ?Recent Labs  ?  08/10/21 ?2141 08/11/21 ?0800 08/11/21 ?1205  ?GLUCAP 115* 131* 123*  ?  ? ? ?Coagulation Profile: ?No results for input(s): INR, PROTIME in the last 168 hours. ? ? ?Radiology Studies: I have personally reviewed the imaging studies  ?MR FOOT LEFT W WO CONTRAST ? ?Result Date: 08/09/2021 ? IMPRESSION: 1. Soft tissue wound along the plantar aspect of the first MTP joint with surrounding cellulitis and a 10 mm abscess. Large first MTP joint effusion with synovitis. Periarticular erosion involving the medial aspect of the first metatarsal head. Differential considerations include septic arthritis of the first MTP joint versus inflammatory joint effusion secondary to underlying arthropathy such as gout. If there is further clinical concern, recommend arthrocentesis. 2. Mild edema in the medial hallux sesamoid and adjacent plantar aspect of the first metatarsal head likely reflecting arthritic changes. 3. 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 4. Severe edema of the foot which may be reactive secondary to fluid overload or venous insufficiency versus cellulitis. Electronically Signed   By: Kathreen Devoid M.D.   On: 08/09/2021 07:43  ? ?[place]

## 2021-08-11 NOTE — Brief Op Note (Signed)
08/11/2021 ? ?12:03 PM ? ?PATIENT:  Shawn Blanchard  86 y.o. male ? ?PRE-OPERATIVE DIAGNOSIS:  LEFT FOOT ABCESS/DIABETIC FOOT ? ?POST-OPERATIVE DIAGNOSIS:  GOUT ? ?PROCEDURE:  Procedure(s): ?IRRIGATION AND DEBRIDEMENT FOOT, AND BONE BIOPSY, PARTIAL SESMOIDECTOMY (Left) ? ?SURGEON:  Surgeon(s) and Role: ?   * Leevon Upperman, Stephan Minister, DPM - Primary ? ?PHYSICIAN ASSISTANT:  ? ?ASSISTANTS: none  ? ?ANESTHESIA:   MAC & local ? ?EBL:  10 mL  ? ?BLOOD ADMINISTERED:none ? ?DRAINS: none  ? ?LOCAL MEDICATIONS USED:  MARCAINE  w/ epinephrine  and Amount: 10 ml, Mayo block ? ?SPECIMEN:  No Specimen ? ?DISPOSITION OF SPECIMEN:  PATHOLOGY & Micro x2 ? ?COUNTS:  YES ?TOURNIQUET:  41 minutes eschmark bandage ? ?DICTATION: .Note written in EPIC ? ?PLAN OF CARE: Admit to inpatient  ? ?PATIENT DISPOSITION:  PACU - hemodynamically stable. ?  ?Delay start of Pharmacological VTE agent (>24hrs) due to surgical blood loss or risk of bleeding: yes ? ?

## 2021-08-11 NOTE — Op Note (Addendum)
? ? ?Patient Name: Shawn Blanchard ?DOB: 05-22-1935  ?MRN: [id number] ?  ?Date of Service: 08/08/2021 - 08/11/2021 ? ?Surgeon: Dr. [name], DPM ?Assistants: None ?Pre-operative Diagnosis:  ?Abscess of left foot ?Ulcer of left foot ? ?Post-operative Diagnosis:  ?Joint effusion left foot ?Gouty arthritis left foot ?Ulcer of left foot ?Procedures: ? 1) partial ostectomy tibial sesamoid ? 2) arthrotomy and washout of first MTPJ ? 3) excisional debridement of ulcer skin and subcutaneous tissue 1 cm? ? ?Pathology/Specimens: ?ID Type Source Tests Collected by Time Destination  ?1 : Left Tibial Sesmoid, Bone Biopsy Tissue PATH Bone biopsy SURGICAL PATHOLOGY Criselda Peaches, DPM 08/11/2021 1214   ?A : Abscess culture, left foot Abscess Abscess AEROBIC/ANAEROBIC CULTURE W GRAM STAIN (SURGICAL/DEEP WOUND) Criselda Peaches, DPM 08/11/2021 1113   ?B : Joint culture, big toe, left foot Body Fluid Joint, Other AEROBIC/ANAEROBIC CULTURE W Lonell Grandchild STAIN (SURGICAL/DEEP WOUND) Criselda Peaches, DPM 08/11/2021 1122   ? ?Anesthesia: MAC with local ?Hemostasis: Esmarch around the ankle for 41 minutes ?Estimated Blood Loss: 10 mL ?Materials: * No implants in log * ?Medications: 10 cc Marcaine 0.5% with epinephrine, 10 mg dexamethasone phosphate ?Complications: No complications noted ? ?Indications for Procedure:  ?This is a 86 y.o. male with a history of type 2 diabetes, swelling and erythema of the left foot.  Preoperative MRI was concerning for an abscess plantar to the first MTPJ.  Operative debridement and incision and drainage was recommended.  The risk benefits of this complication of the surgery were discussed with the patient and his family.  Informed consent was signed and reviewed prior to surgery all questions were addressed. ?  ?Procedure in Detail: ?Patient was identified in pre-operative holding area. Formal consent was signed and the left lower extremity was marked. Patient was brought back to the operating room. Anesthesia  was induced. The extremity was prepped and draped in the usual sterile fashion. Timeout was taken to confirm patient name, laterality, and procedure prior to incision.  ? ?Attention was then directed to the left foot which exhibited a 1.0x1.0 ulceration on the plantar first MTPJ superficial to the tibial sesamoid.  An Esmarch bandage was wrapped around the foot and secured around the ankle as a compressive tourniquet.  I elected to make an incision on the medial portion of the MTPJ as to avoid the weightbearing surface of the foot.  This incision was carried deep to subcutaneous tissue cauterizing bleeding vessels as necessary.  The plantar subcutaneous tissues were explored and no gross purulence was found here.  This corresponded to the area present on the MRI.  I took a culture of the area and with this was sent to microbiology.  I then elected to proceed with arthrotomy and exploration of the MTPJ.  An incision was made on the plantar medial capsule.  During incision this was opened and a significant amount of cloudy yellow fluid which appeared to be consistent with a gouty tophus and joint effusion emanated from the joint.  This fluid was cultured as well.  Significant arthritic changes were present in the tibial sesamoid articulation with the metatarsal head, the base of the proximal phalanx and the metatarsal head itself.  The wound and all areas were thoroughly irrigated with 3 L normal sterile saline with a pulse irrigator then.  Following this I proceeded with partial sesamoidectomy of the tibial sesamoid to remove the plantar pressure on the ulceration as well as biopsy of the tibial sesamoid which corresponded with the fluid  that emanated from the joint.  A sagittal saw was used to resect the plantar 50% and plane the tibial sesamoid.  This removed all palpable prominence.  I was satisfied with the result of the procedure and then closed the wound with 3-0 Monocryl and 3-0 nylon in layers.  Once this was  completed I debrided the plantar ulceration sharply using a scalpel full-thickness in an excisional manner.  Total area of square centimeters was 1.0 of debridement.  Following closure 10 mg of dexamethasone phosphate was injected directly into the metatarsal phalangeal joint from a dorsal approach. ? ?The foot was then dressed with Xeroform and dry sterile dressings with an Ace wrap under light compression. Patient tolerated the procedure well. ?  ?Disposition: ?Following a period of post-operative monitoring, patient will be transferred to the floor.  I expect he will be able to be discharged on oral antibiotics to home tomorrow.  I am hopeful that the partial sesamoidectomy alleviate pressure on the wound and allows it to heal in a secondary manner. ? ?

## 2021-08-11 NOTE — Anesthesia Preprocedure Evaluation (Addendum)
Anesthesia Evaluation  ?Patient identified by MRN, date of birth, ID band ?Patient awake ? ? ? ?Reviewed: ?Allergy & Precautions, NPO status , Patient's Chart, lab work & pertinent test results, reviewed documented beta blocker date and time  ? ?History of Anesthesia Complications ?Negative for: history of anesthetic complications ? ?Airway ?Mallampati: II ? ?TM Distance: >3 FB ?Neck ROM: Full ? ? ? Dental ?no notable dental hx. ? ?  ?Pulmonary ?sleep apnea , COPD,  ?  ?Pulmonary exam normal ? ? ? ? ? ? ? Cardiovascular ?hypertension, Pt. on medications and Pt. on home beta blockers ?pulmonary hypertension+ CAD, + Past MI and +CHF  ?Normal cardiovascular exam+ dysrhythmias Atrial Fibrillation  ? ?TTE 08/09/21: EF 45-50%, global hypokinesis, moderate LVH, moderately elevated PASP,  mild to moderate TR  ?  ?Neuro/Psych ?  ? GI/Hepatic ?Neg liver ROS, GERD  ,  ?Endo/Other  ?diabetes, Type 2, Oral Hypoglycemic Agents ? Renal/GU ?Renal InsufficiencyRenal disease  ?negative genitourinary ?  ?Musculoskeletal ? ?(+) Arthritis ,  ? Abdominal ?  ?Peds ? Hematology ? ?(+) Blood dyscrasia (Hgb 10.7), anemia ,   ?Anesthesia Other Findings ?Day of surgery medications reviewed with patient. ? Reproductive/Obstetrics ?negative OB ROS ? ?  ? ? ? ? ? ? ? ? ? ? ? ? ? ?  ?  ? ? ? ? ? ? ? ?Anesthesia Physical ?Anesthesia Plan ? ?ASA: 3 and emergent ? ?Anesthesia Plan: MAC  ? ?Post-op Pain Management: Tylenol PO (pre-op)*  ? ?Induction:  ? ?PONV Risk Score and Plan: 1 and Treatment may vary due to age or medical condition and Propofol infusion ? ?Airway Management Planned: Natural Airway and Simple Face Mask ? ?Additional Equipment: None ? ?Intra-op Plan:  ? ?Post-operative Plan:  ? ?Informed Consent: I have reviewed the patients History and Physical, chart, labs and discussed the procedure including the risks, benefits and alternatives for the proposed anesthesia with the patient or authorized  representative who has indicated his/her understanding and acceptance.  ? ? ? ? ? ?Plan Discussed with: CRNA ? ?Anesthesia Plan Comments:   ? ? ? ? ?Anesthesia Quick Evaluation ? ?

## 2021-08-11 NOTE — Transfer of Care (Signed)
Immediate Anesthesia Transfer of Care Note ? ?Patient: Shawn Blanchard ? ?Procedure(s) Performed: IRRIGATION AND DEBRIDEMENT FOOT, AND BONE BIOPSY, PARTIAL SESMOIDECTOMY (Left: Foot) ? ?Patient Location: PACU ? ?Anesthesia Type:MAC ? ?Level of Consciousness: awake, alert , oriented and patient cooperative ? ?Airway & Oxygen Therapy: Patient Spontanous Breathing and Patient connected to nasal cannula oxygen ? ?Post-op Assessment: Report given to RN, Post -op Vital signs reviewed and stable and Patient moving all extremities ? ?Post vital signs: Reviewed and stable ? ?Last Vitals:  ?Vitals Value Taken Time  ?BP    ?Temp    ?Pulse    ?Resp    ?SpO2    ? ? ?Last Pain:  ?Vitals:  ? 08/11/21 0456  ?TempSrc: Oral  ?PainSc:   ?   ? ?  ? ?Complications: No notable events documented. ?

## 2021-08-11 NOTE — H&P (Signed)
History and Physical Interval Note: ? ?08/11/2021 ?10:27 AM ? ?Shawn Blanchard  has presented today for surgery, with the diagnosis of left foot abscess.  The various methods of treatment have been discussed with the patient and family. After consideration of risks, benefits and other options for treatment, the patient has consented to   ?Procedure(s): ?IRRIGATION AND DEBRIDEMENT FOOT (Left) as a surgical intervention.  The patient's history has been reviewed, patient examined, no change in status, stable for surgery.  I have reviewed the patient's chart and labs.  Questions were answered to the patient's satisfaction.   ? ? ?Stephan Minister Zauria Dombek ? ? ?

## 2021-08-12 ENCOUNTER — Encounter (HOSPITAL_COMMUNITY): Payer: Self-pay | Admitting: Podiatry

## 2021-08-12 DIAGNOSIS — E11628 Type 2 diabetes mellitus with other skin complications: Secondary | ICD-10-CM | POA: Diagnosis not present

## 2021-08-12 DIAGNOSIS — I509 Heart failure, unspecified: Secondary | ICD-10-CM | POA: Diagnosis not present

## 2021-08-12 DIAGNOSIS — M10072 Idiopathic gout, left ankle and foot: Secondary | ICD-10-CM | POA: Diagnosis not present

## 2021-08-12 DIAGNOSIS — I5023 Acute on chronic systolic (congestive) heart failure: Secondary | ICD-10-CM | POA: Diagnosis not present

## 2021-08-12 LAB — CBC
HCT: 38.4 % — ABNORMAL LOW (ref 39.0–52.0)
Hemoglobin: 11.2 g/dL — ABNORMAL LOW (ref 13.0–17.0)
MCH: 26.5 pg (ref 26.0–34.0)
MCHC: 29.2 g/dL — ABNORMAL LOW (ref 30.0–36.0)
MCV: 90.8 fL (ref 80.0–100.0)
Platelets: 195 10*3/uL (ref 150–400)
RBC: 4.23 MIL/uL (ref 4.22–5.81)
RDW: 16.1 % — ABNORMAL HIGH (ref 11.5–15.5)
WBC: 8.4 10*3/uL (ref 4.0–10.5)
nRBC: 0 % (ref 0.0–0.2)

## 2021-08-12 LAB — GLUCOSE, CAPILLARY
Glucose-Capillary: 235 mg/dL — ABNORMAL HIGH (ref 70–99)
Glucose-Capillary: 252 mg/dL — ABNORMAL HIGH (ref 70–99)

## 2021-08-12 LAB — BASIC METABOLIC PANEL
Anion gap: 11 (ref 5–15)
BUN: 31 mg/dL — ABNORMAL HIGH (ref 8–23)
CO2: 22 mmol/L (ref 22–32)
Calcium: 8.8 mg/dL — ABNORMAL LOW (ref 8.9–10.3)
Chloride: 103 mmol/L (ref 98–111)
Creatinine, Ser: 1.12 mg/dL (ref 0.61–1.24)
GFR, Estimated: >60 mL/min (ref >60)
Glucose, Bld: 247 mg/dL — ABNORMAL HIGH (ref 70–99)
Potassium: 3.5 mmol/L (ref 3.5–5.1)
Sodium: 136 mmol/L (ref 135–145)

## 2021-08-12 LAB — SEDIMENTATION RATE: Sed Rate: 49 mm/hr — ABNORMAL HIGH (ref 0–16)

## 2021-08-12 LAB — C-REACTIVE PROTEIN: CRP: 10.7 mg/dL — ABNORMAL HIGH (ref <1.0)

## 2021-08-12 LAB — URIC ACID: Uric Acid, Serum: 9.5 mg/dL — ABNORMAL HIGH (ref 3.7–8.6)

## 2021-08-12 MED ORDER — ALLOPURINOL 100 MG PO TABS
100.0000 mg | ORAL_TABLET | Freq: Every day | ORAL | Status: DC
Start: 2021-08-12 — End: 2021-08-12
  Administered 2021-08-12: 100 mg via ORAL
  Filled 2021-08-12: qty 1

## 2021-08-12 MED ORDER — ALLOPURINOL 100 MG PO TABS: 100.0000 mg | ORAL_TABLET | Freq: Every day | ORAL | 3 refills | Status: DC

## 2021-08-12 MED ORDER — ALBUTEROL SULFATE HFA 108 (90 BASE) MCG/ACT IN AERS: 2.0000 | INHALATION_SPRAY | Freq: Four times a day (QID) | RESPIRATORY_TRACT | 2 refills | Status: DC | PRN

## 2021-08-12 MED ORDER — AMOXICILLIN-POT CLAVULANATE 875-125 MG PO TABS: 1.0000 | ORAL_TABLET | Freq: Two times a day (BID) | ORAL | 0 refills | Status: DC

## 2021-08-12 MED ORDER — DOXYCYCLINE HYCLATE 100 MG PO TABS
100.0000 mg | ORAL_TABLET | Freq: Two times a day (BID) | ORAL | Status: DC
  Administered 2021-08-12: 100 mg via ORAL
  Filled 2021-08-12: qty 1

## 2021-08-12 MED ORDER — DOXYCYCLINE HYCLATE 100 MG PO TABS: 100.0000 mg | ORAL_TABLET | Freq: Two times a day (BID) | ORAL | 0 refills | Status: DC

## 2021-08-12 MED ORDER — AMOXICILLIN-POT CLAVULANATE 875-125 MG PO TABS: 1.0000 | ORAL_TABLET | Freq: Two times a day (BID) | ORAL | Status: DC

## 2021-08-12 MED ORDER — IPRATROPIUM-ALBUTEROL 0.5-2.5 (3) MG/3ML IN SOLN
3.0000 mL | Freq: Three times a day (TID) | RESPIRATORY_TRACT | Status: DC
  Administered 2021-08-12: 3 mL via RESPIRATORY_TRACT
  Filled 2021-08-12: qty 3

## 2021-08-12 MED ORDER — PREDNISONE 20 MG PO TABS: 40.0000 mg | ORAL_TABLET | Freq: Every day | ORAL | 0 refills | Status: DC

## 2021-08-12 MED ORDER — COLCHICINE 0.6 MG PO TABS: 0.6000 mg | ORAL_TABLET | Freq: Every day | ORAL | 1 refills | Status: DC

## 2021-08-12 MED ORDER — MEDIHONEY WOUND/BURN DRESSING EX PSTE: 1.0000 "application " | PASTE | Freq: Every day | CUTANEOUS | 3 refills | Status: DC

## 2021-08-12 MED ORDER — ONDANSETRON HCL 4 MG PO TABS: 4.0000 mg | ORAL_TABLET | Freq: Four times a day (QID) | ORAL | 0 refills | Status: DC | PRN

## 2021-08-12 MED ORDER — DABIGATRAN ETEXILATE MESYLATE 150 MG PO CAPS
150.0000 mg | ORAL_CAPSULE | Freq: Two times a day (BID) | ORAL | Status: DC
  Administered 2021-08-12: 150 mg via ORAL
  Filled 2021-08-12: qty 1

## 2021-08-12 NOTE — Progress Notes (Signed)
ID Brief Note  ? ?Patient has not been seen.  ?Called by Dr Tana Coast that patient is planned for discharge today with PO antibiotics per her discussion with podiatry although OR findings were supportive of gouty arthritis pending OR cultures ( no organisms in gram stain and cultures pending) ? ?Since patient has been planned for  discharge with no concerns for bony infection, I  have recommended her PO doxycyline and PO augmentin for discharge pending cultures.  ? ?Patient has nausea listed for both of these antibiotics and should be OK to trial if patient agrees.  ? ?Rosiland Oz, MD ?Infectious Disease Physician ?[facility] ?[address] ?[place], [place] [address] ?Phone: [phone]  Fax: [phone] ? ? ? ? ?

## 2021-08-12 NOTE — Discharge Summary (Signed)
?Physician Discharge Summary ?  ?Patient: Shawn Blanchard MRN: [id number] DOB: 25-Aug-1935  ?Admit date:     08/08/2021  ?Discharge date: 08/12/21  ?Discharge Physician: Ripudeep Rai  ? ?PCP: Haydee Salter, MD  ? ?Recommendations at discharge:  ? ?Started on allopurinol 100 mg daily due to acute gout and hyperuricemia ?Continue prednisone 40 mg daily for 4 more days, colchicine 0.6 mg daily for 5 to 7 days until failure resolved or develops GI symptoms ?Per ID, placed on p.o. doxycycline and p.o. Augmentin for 2 weeks.  Discussed with the patient and daughter as nausea was listed in allergy.  Patient is okay to trial both antibiotics as he had taken these antibiotics sometime ago. ?Outpatient follow-up with Dr. Sherryle Lis will follow cultures from the OR ? ?Discharge Diagnoses: ? ?  Diabetic foot infection ([place]) ?Acute gout flare in his left foot ?  Chronic atrial fibrillation (HCC) ?  Acute on chronic systolic CHF (congestive heart failure) ([place]) ?  Coronary artery disease involving native coronary artery of native heart with angina pectoris ([place]) ?  Type 2 diabetes mellitus with neurologic complication, without long-term current use of insulin ([place]) ?  Hyperlipidemia ?  Pulmonary hypertension ([place]) ?  Essential hypertension ? Stage 3a chronic kidney disease ([place]) ?  Aortic atherosclerosis ([place]) ?  Class 1 obesity ? ? ? ?Hospital Course: ?Patient is a 86 year old male with history of chronic systolic CHF, adenocarcinoma small bowel, lymphoma small intestine, history of multiple SBO's, chronic A-fib on Pradaxa, CAD, diabetic polyneuropathy, GERD, hyperlipidemia, chronic gout, pulmonary hypertension, restrictive lung disease, diabetes mellitus type 2 presented to ED after having a fall in the bathroom.  Patient also reported recent dyspnea associated with lower extremity edema, occasional nonproductive cough.  No chest pain, orthopnea, PND or palpitations.  He also reported frequent postural dizziness, frequently  adds salt to his meals.  Patient also noted to have left foot swelling with surrounding erythema, area with purulent collection, tenderness. ?Left foot x-ray did not show acute osteomyelitis chest x-ray showed cardiomegaly with minimal bibasilar atelectasis. ? ?Assessment and Plan: ? ?Diabetic foot infection ([place]) ?-Following podiatry, Dr. Blenda Mounts, has ulceration of the left foot, not fully healed, worsened over the last 2 to 3 days with pain, swelling and unable to bear weight ?-Patient was placed on IV vancomycin, cefepime and Flagyl ?-MRI showed soft tissue wound along the plantar aspect of the first MTP joint with surrounding cellulitis and a 10 mm abscess.  Large first MTP joint effusion with synovitis.  Periarticular erosion involving the medial aspect of first metatarsal head.  No acute osteomyelitis ?-Patient underwent I&D in OR by podiatry.  Per Dr. Sherryle Lis, there was no pus or abscess, appear to be acute gout flare with tophi.  Cultures were taken, recommended to treat for acute gout flare ?- d/w ID, Dr [place], recommended continue antibiotics for 2 weeks.  Patient is okay for trial of p.o. doxycycline and p.o. Augmentin ?- Dr. Sherryle Lis will follow cultures. ? ?  ?Acute gout flare left foot ?-Continue colchicine, prednisone for 4 more days, started on allopurinol 100 mg daily ?-ESR 49, CRP was 16.6 on 5/3, uric acid 9.5 ? ?  ?  Chronic atrial fibrillation (HCC) ?-Currently rate controlled, continue Coreg ?-Resumed Pradaxa ?  ?Acute on chronic systolic CHF ?-Received Lasix 40 mg IV x1 in ED, resumed oral Lasix 20 mg twice daily ?-Continue Coreg ?-2D echo showed EF of 45 to 50% with global hypokinesis ?-Patient had issues with wheezing, did need couple  of doses of Lasix 20 mg IV with significant improvement ?-Also placed on albuterol inhaler as needed.  Continue Lasix home dose. ?  ?  Coronary artery disease involving native coronary artery of native heart with angina pectoris ([place]) ?-Currently no acute  chest pain,  ?-Continue Coreg, Lasix, Zetia ?  ?  ?  Type 2 diabetes mellitus with neurologic complication, without long-term current use of insulin ([place]) ?-Hemoglobin A1c 6.9 on 06/22/2021 ?-Continue Janumet, metformin. ?  ?  Hyperlipidemia ?-Continue Zetia ?   ?CKD, stage IIIa ?-Creatinine currently at baseline, 1.2-1.3 ?-Creatinine 1.29, follow closely with IV Lasix. ?  ?Obesity, class I ?Estimated body mass index is 34.87 kg/m? as calculated from the following: ?  Height as of this encounter: 5' 11" (1.803 m). ?  Weight as of this encounter: 113.4 kg. ? ? ?Pain control - [employer] Controlled Substance Reporting System database was reviewed. and patient was instructed, not to drive, operate heavy machinery, perform activities at heights, swimming or participation in water activities or provide baby-sitting services while on Pain, Sleep and Anxiety Medications; until their outpatient Physician has advised to do so again. Also recommended to not to take more than prescribed Pain, Sleep and Anxiety Medications.  ?Consultants: Infectious disease, podiatry ?Procedures performed: I&D ?Disposition: Home ?Diet recommendation:  ?Discharge Diet Orders (From admission, onward)  ? ?  Start     Ordered  ? 08/12/21 0000  Diet Carb Modified       ? 08/12/21 1130  ? ?  ?  ? ?  ? ?Carb modified diet ?DISCHARGE MEDICATION: ?Allergies as of 08/12/2021   ? ?   Reactions  ? Atorvastatin Other (See Comments)  ? Dizziness.  ? Jardiance [empagliflozin] Other (See Comments)  ? Orthostasis  ? Ace Inhibitors Rash  ? Amoxicillin Nausea Only  ? Meperidine Rash  ? Naproxen Sodium Rash  ? Sulfa Antibiotics Nausea Only  ? Tetracycline Nausea Only  ? ?  ? ?  ?Medication List  ?  ? ?STOP taking these medications   ? ?MULTI-VITAMINS PO ?  ? ?  ? ?TAKE these medications   ? ?albuterol 108 (90 Base) MCG/ACT inhaler ?Commonly known as: VENTOLIN HFA ?Inhale 2 puffs into the lungs every 6 (six) hours as needed for wheezing or shortness of breath. ?   ?allopurinol 100 MG tablet ?Commonly known as: ZYLOPRIM ?Take 1 tablet (100 mg total) by mouth daily. ?  ?Alpha-Lipoic Acid 600 MG Tabs ?Take 600 mg by mouth in the morning and at bedtime. ?  ?amoxicillin-clavulanate 875-125 MG tablet ?Commonly known as: AUGMENTIN ?Take 1 tablet by mouth every 12 (twelve) hours for 14 days. ?  ?b complex vitamins capsule ?Take 1 capsule by mouth daily. ?  ?carvedilol 3.125 MG tablet ?Commonly known as: COREG ?TAKE 1 TABLET BY MOUTH 2 TIMES A DAY WITH A MEAL ?What changed: See the new instructions. ?  ?Citrucel oral powder ?Generic drug: methylcellulose ?Take 1 packet by mouth daily. ?  ?colchicine 0.6 MG tablet ?Take 1 tablet (0.6 mg total) by mouth daily. Continue for 5-7 days daily for the acute gout flare as needed.  Discontinue if GI upset or diarrhea ?Start taking on: Aug 13, 2021 ?  ?dabigatran 150 MG Caps capsule ?Commonly known as: PRADAXA ?TAKE 1 CAPSULE BY MOUTH 2 TIMES DAILY ?  ?doxycycline 100 MG tablet ?Commonly known as: VIBRA-TABS ?Take 1 tablet (100 mg total) by mouth 2 (two) times daily for 14 days. ?  ?ezetimibe 10 MG tablet ?Commonly known  as: ZETIA ?Take 10 mg by mouth daily. ?  ?FeroSul 325 (65 FE) MG tablet ?Generic drug: ferrous sulfate ?TAKE 1 TABLET BY MOUTH 2 TIMES A DAY ?  ?furosemide 20 MG tablet ?Commonly known as: LASIX ?TAKE 1 TABLET BY MOUTH 2 TIMES A DAY ?What changed: when to take this ?  ?glucose blood test strip ?OneTouch Ultra [employer] ?  ?hydrocortisone 2.5 % ointment ?Apply 1 application topically 2 (two) times daily as needed (Skin Irritation). ?  ?Janumet XR [phone] MG Tb24 ?Generic drug: SitaGLIPtin-MetFORMIN HCl ?TAKE 1 TABLET BY MOUTH EVERY DAY WITH BREAKFAST (FOR DIABETES) ?  ?leptospermum manuka honey Pste paste ?Apply 1 application. topically daily. Cleanse wound to left great toe with soap and water and pat dry.  Apply medihoney to wound bed and cover with dry dressing daily. Apply thin layer (3 mm) to wound. ?  ?meclizine 12.5  MG tablet ?Commonly known as: ANTIVERT ?Take 1 tablet (12.5 mg total) by mouth 3 (three) times daily as needed for dizziness. ?  ?Melatonin 5 MG Caps ?Take 5 mg by mouth at bedtime. ?  ?metFORMIN 500 MG 2

## 2021-08-12 NOTE — Discharge Instructions (Signed)
Dr Maxie Barb office will arrange your post op follow up ? ?You may put weight on the foot in the surgical shoe ? ?Change the dressing every 2 to 3 days (first change on Tuesday), apply Medihoney to the ulcer and wrap with gauze and Ace wrap. ? ?Monitor for any signs of infection such as pus or malodorous drainage coming from incision, fevers chills nausea vomiting, notify my office or on-call doctor immediately if this develops ?

## 2021-08-12 NOTE — Progress Notes (Signed)
Pt discharged to home. Dc instructions given with wife and daughter at bedside. No concerns voiced. Pt left in wheelchair pushed by nurse tech accompanied by family. No concerns voiced.  ?

## 2021-08-12 NOTE — TOC Transition Note (Signed)
Transition of Care (TOC) - CM/SW Discharge Note ? ? ?Patient Details  ?Name: Shawn Blanchard ?MRN: [id number] ?Date of Birth: [birth year] ? ?Transition of Care (TOC) CM/SW Contact:  ?Tawanna Cooler, RN ?Phone Number: ?08/12/2021, 12:09 PM ? ? ?Clinical Narrative:    ? ?Patient has order for home health.  Referral was already sent to and accepted by [facility].  ? ? ?Final next level of care: [place] ?Barriers to Discharge: Continued Medical Work up ? ? ?Patient Goals and CMS Choice ?Patient states their goals for this hospitalization and ongoing recovery are:: To go home ?CMS Medicare.gov Compare Post Acute Care list provided to:: Patient ?Choice offered to / list presented to : Patient ? ? ?Discharge Plan and Services ?  ?Discharge Planning Services: CM Consult ?Post Acute Care Choice: Home Health          ?  ?   ?HH Arranged: RN ?[place] Agency: [place] ?Date HH Agency Contacted: 08/10/21 ?Time [place] II: 3212 ?Representative spoke with at [place]: Tommi Rumps ? ?Social Determinants of Health (SDOH) Interventions ?  ? ? ?Readmission Risk Interventions ? ?  08/10/2021  ? 10:14 AM  ?Readmission Risk Prevention Plan  ?Transportation Screening Complete  ?PCP or Specialist Appt within 5-7 Days Complete  ?Home Care Screening Complete  ?Medication Review (RN CM) Complete  ? ? ? ? ? ?

## 2021-08-12 NOTE — Progress Notes (Signed)
CPAP placed on patient for night ?

## 2021-08-13 ENCOUNTER — Ambulatory Visit: Payer: Medicare PPO | Admitting: Podiatry

## 2021-08-13 ENCOUNTER — Telehealth: Payer: Self-pay | Admitting: Family Medicine

## 2021-08-13 ENCOUNTER — Telehealth: Payer: Self-pay | Admitting: *Deleted

## 2021-08-13 LAB — CULTURE, BLOOD (ROUTINE X 2)
Culture: NO GROWTH
Culture: NO GROWTH
Special Requests: ADEQUATE
Special Requests: ADEQUATE

## 2021-08-13 MED ORDER — SPACER/AERO-HOLDING CHAMBERS DEVI: 0 refills | Status: DC

## 2021-08-13 NOTE — Telephone Encounter (Signed)
Called patient's daughter giving information/recommendations  per Dr Lana Fish understanding. ?

## 2021-08-13 NOTE — Telephone Encounter (Signed)
Patient's daughter is wanting to know if coughing could be a side effect of his surgery. She knows that the surgical area should not be disturbed but wanted to now how often to dress,wrap the ulcer on foot. Please advise. ?

## 2021-08-13 NOTE — Telephone Encounter (Signed)
I doubt it is a side effect of the surgery he had quite a bit of wheezing prior to surgery and is likely related to this.  They should change the dressing starting tomorrow every 2 to 3 days, apply Medihoney to the wound bed, do not need to put it on the incision where the stitches are

## 2021-08-13 NOTE — Telephone Encounter (Signed)
Pts wife says pt was prescribed albuteral while in the hospital and he does not have the space for it. I asked if she contacted the pharmacy and she stated that they need an Rx.  ?

## 2021-08-13 NOTE — Telephone Encounter (Signed)
Patient was given Albuterol inhaler when discharged form the hospital, he is having a hard time using this and wants to know if you would prescribe the space chamber to help? ? ?Please review and advise.  ?Thanks Dm/cma ? ?

## 2021-08-14 ENCOUNTER — Telehealth: Payer: Self-pay

## 2021-08-14 ENCOUNTER — Telehealth: Payer: Self-pay | Admitting: Family Medicine

## 2021-08-14 LAB — SURGICAL PATHOLOGY

## 2021-08-14 NOTE — Telephone Encounter (Signed)
Spoke to patients wife, [name], he is having diarrhea and wants to know if its is okay to take Imodium.   Spoke to Dr [name] and he states that it is okay to take Imodium.   She will go and pick up some from the pharmacy.  Dm/cma ? ?

## 2021-08-14 NOTE — Telephone Encounter (Signed)
Patient wife notified VIA phone.   Dm/cma ? ?

## 2021-08-15 ENCOUNTER — Telehealth: Payer: Self-pay

## 2021-08-15 ENCOUNTER — Telehealth: Payer: Self-pay | Admitting: Family Medicine

## 2021-08-15 NOTE — Telephone Encounter (Signed)
Verbal orders needed for OT 1 time a week for 8 weeks.  ? ?CBMerrie Roof, [phone] ?

## 2021-08-15 NOTE — Telephone Encounter (Signed)
Transition Care Management Unsuccessful Follow-up Telephone Call ? ?Date of discharge and from where:  [facility] 08/12/2021 ? ?Attempts:  1st Attempt ? ?Reason for unsuccessful TCM follow-up call:  No answer/busy ? ?  ?

## 2021-08-16 LAB — AEROBIC/ANAEROBIC CULTURE W GRAM STAIN (SURGICAL/DEEP WOUND)
Culture: NO GROWTH
Culture: NO GROWTH
Gram Stain: NONE SEEN
Gram Stain: NONE SEEN

## 2021-08-16 NOTE — Telephone Encounter (Signed)
Spoke to [place] and gave verbal okay for TO per Dr [name].  Dm/cma ? ?

## 2021-08-21 ENCOUNTER — Ambulatory Visit (INDEPENDENT_AMBULATORY_CARE_PROVIDER_SITE_OTHER): Payer: Medicare PPO | Admitting: Podiatry

## 2021-08-21 DIAGNOSIS — L97522 Non-pressure chronic ulcer of other part of left foot with fat layer exposed: Secondary | ICD-10-CM

## 2021-08-21 DIAGNOSIS — E11621 Type 2 diabetes mellitus with foot ulcer: Secondary | ICD-10-CM

## 2021-08-21 NOTE — Patient Instructions (Signed)
Change dressing daily with the iodosorb ointment on incision and ulcer. Dress with gauze pads, ABD pad, and ACE wrap ?

## 2021-08-22 ENCOUNTER — Telehealth: Payer: Self-pay | Admitting: *Deleted

## 2021-08-22 ENCOUNTER — Telehealth: Payer: Self-pay

## 2021-08-22 ENCOUNTER — Encounter: Payer: Self-pay | Admitting: Podiatry

## 2021-08-22 MED ORDER — CEPHALEXIN 500 MG PO CAPS: 500.0000 mg | ORAL_CAPSULE | Freq: Three times a day (TID) | ORAL | 0 refills | Status: DC

## 2021-08-22 NOTE — Telephone Encounter (Signed)
Spoke to Shawn Blanchard with Shawn Blanchard, Dr [name] will okay the PT/OT, but the nursing needs to go through his foot doctor. We haven't seen him in 2 months.  Dm/cma ? ?

## 2021-08-22 NOTE — Telephone Encounter (Signed)
Patient's wife called because the patient wound has gotten bigger and has a bloody clear drainage since last night.  ?Returned the call back to patient and explained  that I would let the physician aware, read over the care of taking care of the wound,redressing and using the ointment daily until next appointment. ?

## 2021-08-22 NOTE — Telephone Encounter (Signed)
[facility] has provided service for the patient, no further action required, order date: 08/21/21. ?

## 2021-08-22 NOTE — Telephone Encounter (Signed)
Pts wife called ... OT, PT, and nursing has not been assigned. I informed her I only saw the approval (and request) for OT.  ?Requesting CB.  ?

## 2021-08-22 NOTE — Telephone Encounter (Signed)
Spoke to patient's wife, [name],  ?She states that [facility] told her that were waiting on [place] for orders to do PT/OT.  Called Merrie Roof at [phone] and left VM to rtn call.  We had gave verbal order on 08/15/21. Dm/cma ? ?

## 2021-08-22 NOTE — Telephone Encounter (Signed)
Rx sent for keflex, discussed w/ patient's wife via [facility] ?

## 2021-08-22 NOTE — Telephone Encounter (Signed)
Spoke to [employer], he states that podiatrist was giving the PT and nursing orders but due to patient not wanting to go back to them wanted to know if Dr [name] would give verbal okay for these till he gets established with a new provider for his feet. They will send over the [facility] order. Dm/cma ? ?

## 2021-08-24 ENCOUNTER — Ambulatory Visit: Payer: Medicare PPO | Admitting: Podiatry

## 2021-08-26 NOTE — Progress Notes (Signed)
  Subjective:  Patient ID: Shawn Blanchard, male    DOB: Dec 17, 1935,  MRN: [id number]  Chief Complaint  Patient presents with   Routine Post Op     [place]    DOS: 08/11/2021 Procedure: Partial sesamoidectomy I&D of the MTPJ  86 y.o. male returns for post-op check.  Doing well they have been changing the dressing as directed  Review of Systems: Negative except as noted in the HPI. Denies N/V/F/Ch.   Objective:  There were no vitals filed for this visit. There is no height or weight on file to calculate BMI. Constitutional Well developed. Well nourished.  Vascular Foot warm and well perfused. Capillary refill normal to all digits.  Calf is soft and supple, no posterior calf or knee pain, negative Homans' sign  Neurologic Normal speech. Oriented to person, place, and time. Epicritic sensation to light touch grossly reduced bilaterally.  Dermatologic From delayed healing and central portions of incision.  Plantar ulceration is nearly fully healed.   Orthopedic: There is minimal tenderness to palpation noted about the surgical site.    Assessment:   1. Diabetic ulcer of toe of left foot associated with type 2 diabetes mellitus, with fat layer exposed ([place])    Plan:  Patient was evaluated and treated and all questions answered.  S/p foot surgery left -Progressing as expected post-operatively. -WB Status: WBAT in postop shoe -Sutures: Plan to remove at next visit -Foot redressed.  Change every few days with Iodosorb which I dispensed today as well as gauze dressings. [employer] form sent for wound supplies  Return in about 2 weeks (around 09/04/2021) for wound care, post op (no x-rays).

## 2021-08-27 ENCOUNTER — Other Ambulatory Visit: Payer: Self-pay

## 2021-08-27 NOTE — Progress Notes (Unsigned)
Cardiology Office Note:    Date:  08/28/2021   ID:  Shawn Blanchard, DOB 1936-01-28, MRN [id number]  PCP:  Haydee Salter, MD  Cardiologist:  Shirlee More, MD    Referring MD: Haydee Salter, MD    ASSESSMENT:    1. Hypertensive heart disease with heart failure ([place])   2. Coronary artery disease involving native coronary artery of native heart with angina pectoris ([place])   3. Chronic atrial fibrillation ([place])   4. Chronic anticoagulation   5. Dyslipidemia   6. Statin intolerance   7. Stage 3a chronic kidney disease ([place])    PLAN:    In order of problems listed above:  With the assistance of the family and after reviewing the hospital discharge records, it is obvious he had decompensated heart failure quickly came into line with diuretic doing better on oral but unfortunately his salt loading at home.  Has to fully sodium restrict continue his current loop diuretic and he tells me multiple physicians are checking his labs.  We will continue to trend his blood pressure and continue his carvedilol and diuretic for hypertension Stable CAD continue medical therapy Rate controlled continue his beta-blocker and current anticoagulant Lipids are beyond ideal he will stop Zetia and continue his PCSK9 inhibitor Stable CKD   Next appointment: 6 months   Medication Adjustments/Labs and Tests Ordered: Current medicines are reviewed at length with the patient today.  Concerns regarding medicines are outlined above.  No orders of the defined types were placed in this encounter.  No orders of the defined types were placed in this encounter.   Chief complaint: I was to see you following hospitalization I was not seen by cardiology as an inpatient   History of Present Illness:    Shawn Blanchard is a 86 y.o. male with a hx of CAD with PCI of the LAD in 2002 hypertensive heart disease with heart failure and mildly reduced ejection fraction 47% chronic atrial fibrillation  anticoagulated with Pradaxa PAD CKD symptomatic orthostatic hypotension with an SGLT2 inhibitor and hyperlipidemia with statin intolerance treated with PCSK9 inhibitor and Zetia..Echocardiogram 10/15/2019 showed ejection fraction 45 to 50% mild concentric LVH mild right ventricular dysfunction moderate mitral annular calcification with trivial regurgitation mild aortic regurgitation without findings of aortic stenosis. He was last seen 01/01/2021.  Compliance with diet, lifestyle and medications: Yes  His wife and daughter are present They tell me he is here to see me following hospitalization and when read through the records and although it is not detailed very well there is and mentioned that he was had heart failure he presented to the hospital and received IV diuretic.  He is not seen by cardiology. Since discharge his weight is down 9 pounds he has much less edema he is not short of breath. 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   Aortic atherosclerosis ([place]) 06/22/2021   Arthritis 03/12/2016   Blepharitis of both upper and lower eyelid 10/02/2018   BMI 35.0-35.9,adult 04/11/2016   Carpal tunnel syndrome of right wrist 03/25/2019   Chronic anticoagulation 10/11/2016  Chronic atrial fibrillation ([place]) 06/12/2015   Overview:  Managed CARDS   Chronic diastolic heart failure (Shawn Blanchard) 09/01/2015   Class 1 obesity 08/08/2021   Coronary artery disease involving native coronary artery of native heart with angina pectoris (Shawn Blanchard) 02/15/2015   Overview:  Hx of MI with stent  to LAD 2002 MPS 2016 with EF 47% and no ischemia   Dermatochalasis of both upper eyelids 11/21/2016   Diabetic foot infection ([place]) 08/08/2021   Diabetic polyneuropathy associated with type 2 diabetes mellitus (Shawn Blanchard) 12/16/2018   2020   Disorder of sesamoid bone of foot    Essential hypertension 11/07/2017   Floppy eyelid syndrome of both eyes 10/05/2019   Ganglion cyst 11/27/2015   Overview:  2017: dorsum right hand   Gastroesophageal reflux disease without esophagitis 06/12/2015   Gout of left foot    History of lymphoma 05/17/2021   History of squamous cell carcinoma in situ 04/18/2021   Hyperlipidemia 04/11/2016   Hypertensive heart disease with heart failure ([place]) 02/15/2015   Hypertensive urgency 05/17/2021   Idiopathic chronic gout 05/17/2015   Insomnia 11/27/2015   Iron deficiency anemia 04/27/2021   Leukocytosis 04/11/2016   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Shawn Blanchard) 08/30/2015   Male erectile disorder 06/17/2016   Meibomian gland dysfunction (MGD) of both eyes 10/02/2018   Normocytic anemia 05/17/2021   Obstructive sleep apnea 08/30/2015   Overview:  CPAP   Old MI (myocardial infarction)    Orthostatic dizziness 09/15/2017   2018: med related   Peripheral venous insufficiency 11/08/2020   Peritoneal adhesion 11/08/2020   Pseudophakia of both eyes 03/12/2016   Pulmonary hypertension ([place]) 08/30/2015   Overview:  Managed CARDS   Restless leg syndrome 08/30/2015   SBO (small bowel obstruction) (Shawn Blanchard) 12/14/2019   Sensorineural hearing loss (SNHL), bilateral 10/04/2019   Small bowel obstruction ([place]) 04/11/2016   Stage 3a chronic kidney disease (Shawn Blanchard) 06/13/2019   Statin intolerance 03/09/2021   Type 2 diabetes mellitus with neurologic complication, without long-term current use of insulin (Shawn Blanchard) 12/02/2014   Type 2 diabetes mellitus with stage 3a chronic kidney disease, without long-term current use of insulin (Shawn Blanchard) 02/22/2020   Ulcer of left foot with fat layer exposed (Shawn Blanchard)    UTI (urinary tract  infection) 05/17/2021    Past Surgical History:  Procedure Laterality Date   ABDOMINAL ADHESION SURGERY     x 2   BLEPHAROPLASTY Bilateral    CARPAL TUNNEL RELEASE Right 2021   CATARACT EXTRACTION Bilateral    CERVICAL SPINE SURGERY     CHOLECYSTECTOMY     CORONARY ANGIOPLASTY WITH STENT PLACEMENT  2000   IRRIGATION AND DEBRIDEMENT FOOT Left 08/11/2021   Procedure: IRRIGATION AND DEBRIDEMENT FOOT, AND BONE BIOPSY, PARTIAL SESMOIDECTOMY;  Surgeon: Criselda Peaches, DPM;  Location: [facility];  Service: Podiatry;  Laterality: Left;   NASAL SINUS SURGERY     ORIF TIBIAL SHAFT FRACTURE W/ PLATES AND SCREWS Left    PARTIAL KNEE ARTHROPLASTY Bilateral    SMALL INTESTINE SURGERY     Adenocarcinoma   TONSILLECTOMY      Current Medications: Current Meds  Medication Sig   albuterol (VENTOLIN HFA) 108 (90 Base) MCG/ACT inhaler Inhale 2 puffs into the lungs every 6 (six) hours as needed for wheezing or shortness of breath.   Alpha-Lipoic Acid 600 MG TABS Take 600 mg by mouth in the morning and at bedtime.   b complex vitamins capsule Take 1 capsule by mouth daily.   carvedilol (COREG) 3.125 MG tablet TAKE 1  TABLET BY MOUTH 2 TIMES A DAY WITH A MEAL   cephALEXin (KEFLEX) 500 MG capsule Take 1 capsule (500 mg total) by mouth 3 (three) times daily.   colchicine 0.6 MG tablet Take 1 tablet (0.6 mg total) by mouth daily. Continue for 5-7 days daily for the acute gout flare as needed.  Discontinue if GI upset or diarrhea   dabigatran (PRADAXA) 150 MG CAPS capsule TAKE 1 CAPSULE BY MOUTH 2 TIMES DAILY   FEROSUL 325 (65 Fe) MG tablet TAKE 1 TABLET BY MOUTH 2 TIMES A DAY   furosemide (LASIX) 20 MG tablet Take 20 mg by mouth 2 (two) times daily. If weight is up more than 3 pounds then take an extra tablet   glucose blood test strip OneTouch Ultra Blue Test Strip   hydrocortisone 2.5 % ointment Apply 1 application topically 2 (two) times daily as needed (Skin Irritation).   JANUMET XR 407 [id number] 1588 MG TB24 TAKE 1  TABLET BY MOUTH EVERY DAY WITH BREAKFAST (FOR DIABETES)   Lancets (ONETOUCH DELICA PLUS [id number]) MISC OneTouch Delica Plus Lancet 33 gauge   leptospermum manuka honey (MEDIHONEY) PSTE paste Apply 1 application. topically daily. Cleanse wound to left great toe with soap and water and pat dry.  Apply medihoney to wound bed and cover with dry dressing daily. Apply thin layer (3 mm) to wound.   meclizine (ANTIVERT) 12.5 MG tablet Take 1 tablet (12.5 mg total) by mouth 3 (three) times daily as needed for dizziness.   Melatonin 5 MG CAPS Take 5 mg by mouth at bedtime.   metFORMIN (GLUCOPHAGE-XR) 500 MG 24 hr tablet Take 1 tablet (500 mg total) by mouth daily in the afternoon.   methylcellulose (CITRUCEL) oral powder Take 1 packet by mouth daily.   nitroGLYCERIN (NITROSTAT) 0.4 MG SL tablet PLACE 1 TABLET UNDER TONGUE EVERY 5 MINUTES AS NEEDED FOR CHEST PAIN (UP TO 3 DOSES)   ondansetron (ZOFRAN) 4 MG tablet Take 1 tablet (4 mg total) by mouth every 6 (six) hours as needed for nausea or vomiting.   polyethylene glycol (MIRALAX) 17 g packet Take 17 g by mouth daily as needed.   polyvinyl alcohol (LIQUIFILM TEARS) 1.4 % ophthalmic solution Place 1 drop into both eyes as needed for dry eyes.   predniSONE (DELTASONE) 20 MG tablet Take 2 tablets (40 mg total) by mouth daily before breakfast. Take prednisone 40 mg daily for 4 more days for the acute gout flare.   REPATHA SURECLICK 782 MG/ML SOAJ Inject 140 mg into the skin every 14 (fourteen) days.   Spacer/Aero-Holding Chambers DEVI Use with albuterol inhaler   Turmeric 400 MG CAPS Take 400 mg by mouth daily.   [DISCONTINUED] ezetimibe (ZETIA) 10 MG tablet Take 10 mg by mouth daily.     Allergies:   Atorvastatin, Jardiance [empagliflozin], Ace inhibitors, Amoxicillin, Meperidine, Naproxen sodium, Sulfa antibiotics, and Tetracycline   Social History   Socioeconomic History   Marital status: Married    Spouse name: Not on file   Number of children: 2    Years of education: Not on file   Highest education level: Not on file  Occupational History   Not on file  Tobacco Use   Smoking status: Never   Smokeless tobacco: Never  Vaping Use   Vaping Use: Never used  Substance and Sexual Activity   Alcohol use: Not Currently   Drug use: Never   Sexual activity: Not on file  Other Topics Concern   Not on file  Social  History Narrative   Former high school principal.   Social Determinants of Radio broadcast assistant Strain: Low Risk    Difficulty of Paying Living Expenses: Not hard at all  Food Insecurity: No Food Insecurity   Worried About Charity fundraiser in the Last Year: Never true   Arboriculturist in the Last Year: Never true  Transportation Needs: No Transportation Needs   Lack of Transportation (Medical): No   Lack of Transportation (Non-Medical): No  Physical Activity: Insufficiently Active   Days of Exercise per Week: 3 days   Minutes of Exercise per Session: 40 min  Stress: No Stress Concern Present   Feeling of Stress : Not at all  Social Connections: Socially Integrated   Frequency of Communication with Friends and Family: Twice a week   Frequency of Social Gatherings with Friends and Family: Twice a week   Attends Religious Services: More than 4 times per year   Active Member of [employer] or Organizations: Yes   Attends Music therapist: More than 4 times per year   Marital Status: Married     Family History: The patient's family history includes Cancer in his mother and paternal grandfather; Diabetes in his brother, brother, father, mother, and sister; Heart disease in his father and mother; Stroke in his father. There is no history of Colon cancer, Rectal cancer, Pancreatic cancer, Liver cancer, or Stomach cancer. ROS:   Please see the history of present illness.    All other systems reviewed and are negative.  EKGs/Labs/Other Studies Reviewed:    The following studies were reviewed  today: Echocardiogram [facility] 08/09/2021 showed mildly reduced EF 45 to 50% moderate LVH normal right ventricular size function with moderately elevated pulmonary artery systolic pressure there is no valvular abnormality. EKG:   Performed [facility] 08/09/2021.  Showed atrial fibrillation controlled rate personally reviewed  Recent Labs: 03/29/2021: TSH 1.51 08/08/2021: ALT 12; Magnesium 2.2 08/11/2021: B Natriuretic Peptide 459.5 08/12/2021: BUN 31; Creatinine, Ser 1.12; Hemoglobin 11.2; Platelets 195; Potassium 3.5; Sodium 136   Recent Lipid Panel    Component Value Date/Time   CHOL 63 06/22/2021 0920   TRIG 79.0 06/22/2021 0920   HDL 34.10 (L) 06/22/2021 0920   CHOLHDL 2 06/22/2021 0920   VLDL 15.8 06/22/2021 0920   LDLCALC 13 06/22/2021 0920    Physical Exam:    VS:  BP (!) 148/60   Pulse 72   Ht '5\' 11"'$  (1.803 m)   Wt 252 lb 9.6 oz (114.6 kg)   SpO2 97%   BMI 35.23 kg/m     Wt Readings from Last 3 Encounters:  08/28/21 252 lb 9.6 oz (114.6 kg)  08/08/21 250 lb (113.4 kg)  07/24/21 258 lb 9.6 oz (117.3 kg)     GEN:  Well nourished, well developed in no acute distress HEENT: Normal NECK: No JVD; No carotid bruits LYMPHATICS: No lymphadenopathy CARDIAC: Irregular rate and rhythm  no murmurs, rubs, gallops RESPIRATORY:  Clear to auscultation without rales, wheezing or rhonchi  ABDOMEN: Soft, non-tender, non-distended MUSCULOSKELETAL: He has stasis changes and still has 1-2+ lower extremity pitting edema; No deformity  SKIN: Warm and dry NEUROLOGIC:  Alert and oriented x 3 PSYCHIATRIC:  Normal affect    Signed, Shirlee More, MD  08/28/2021 4:54 PM    [facility]

## 2021-08-28 ENCOUNTER — Encounter: Payer: Self-pay | Admitting: Cardiology

## 2021-08-28 ENCOUNTER — Ambulatory Visit: Payer: Medicare PPO | Admitting: Cardiology

## 2021-08-28 VITALS — BP 148/60 | HR 72 | Ht 71.0 in | Wt 252.6 lb

## 2021-08-28 DIAGNOSIS — E785 Hyperlipidemia, unspecified: Secondary | ICD-10-CM | POA: Diagnosis not present

## 2021-08-28 DIAGNOSIS — N1831 Chronic kidney disease, stage 3a: Secondary | ICD-10-CM | POA: Diagnosis not present

## 2021-08-28 DIAGNOSIS — Z7901 Long term (current) use of anticoagulants: Secondary | ICD-10-CM | POA: Diagnosis not present

## 2021-08-28 DIAGNOSIS — Z789 Other specified health status: Secondary | ICD-10-CM

## 2021-08-28 DIAGNOSIS — I110 Hypertensive heart disease with heart failure: Secondary | ICD-10-CM

## 2021-08-28 DIAGNOSIS — I482 Chronic atrial fibrillation, unspecified: Secondary | ICD-10-CM

## 2021-08-28 DIAGNOSIS — I25119 Atherosclerotic heart disease of native coronary artery with unspecified angina pectoris: Secondary | ICD-10-CM

## 2021-08-28 NOTE — Patient Instructions (Signed)
Medication Instructions:  Your physician has recommended you make the following change in your medication:  Stop taking Zetia after this week If weight is up more than 3 pounds you can take an extra Furosemide  *If you need a refill on your cardiac medications before your next appointment, please call your pharmacy*   Lab Work: NONE If you have labs (blood work) drawn today and your tests are completely normal, you will receive your results only by: [place] (if you have MyChart) OR A paper copy in the mail If you have any lab test that is abnormal or we need to change your treatment, we will call you to review the results.   Testing/Procedures: NONE   Follow-Up: At [facility], you and your health needs are our priority.  As part of our continuing mission to provide you with exceptional heart care, we have created designated Provider Care Teams.  These Care Teams include your primary Cardiologist (physician) and Advanced Practice Providers (APPs -  Physician Assistants and Nurse Practitioners) who all work together to provide you with the care you need, when you need it.  We recommend signing up for the patient portal called "MyChart".  Sign up information is provided on this After Visit Summary.  MyChart is used to connect with patients for Virtual Visits (Telemedicine).  Patients are able to view lab/test results, encounter notes, upcoming appointments, etc.  Non-urgent messages can be sent to your provider as well.   To learn more about what you can do with MyChart, go to [email or web address].    Your next appointment:   6 month(s)  The format for your next appointment:   In Person  Provider:   Shirlee More, MD    Other Instructions   Important Information About Sugar

## 2021-08-30 ENCOUNTER — Ambulatory Visit: Payer: Medicare PPO | Admitting: Podiatry

## 2021-08-30 DIAGNOSIS — D044 Carcinoma in situ of skin of scalp and neck: Secondary | ICD-10-CM | POA: Diagnosis not present

## 2021-09-04 ENCOUNTER — Ambulatory Visit: Payer: Medicare PPO | Admitting: Cardiology

## 2021-09-06 ENCOUNTER — Ambulatory Visit (INDEPENDENT_AMBULATORY_CARE_PROVIDER_SITE_OTHER): Payer: Medicare PPO | Admitting: Podiatry

## 2021-09-06 DIAGNOSIS — L97522 Non-pressure chronic ulcer of other part of left foot with fat layer exposed: Secondary | ICD-10-CM

## 2021-09-06 DIAGNOSIS — E11621 Type 2 diabetes mellitus with foot ulcer: Secondary | ICD-10-CM

## 2021-09-07 ENCOUNTER — Encounter: Payer: Self-pay | Admitting: Family Medicine

## 2021-09-07 DIAGNOSIS — M1A9XX1 Chronic gout, unspecified, with tophus (tophi): Secondary | ICD-10-CM

## 2021-09-07 MED ORDER — ALLOPURINOL 100 MG PO TABS: 100.0000 mg | ORAL_TABLET | Freq: Every day | ORAL | 3 refills | Status: DC

## 2021-09-07 NOTE — Addendum Note (Signed)
Addended by: Haydee Salter on: 09/07/2021 05:05 PM   Modules accepted: Orders

## 2021-09-08 ENCOUNTER — Other Ambulatory Visit: Payer: Self-pay | Admitting: Family Medicine

## 2021-09-08 DIAGNOSIS — D509 Iron deficiency anemia, unspecified: Secondary | ICD-10-CM

## 2021-09-10 ENCOUNTER — Encounter: Payer: Self-pay | Admitting: Podiatry

## 2021-09-10 NOTE — Progress Notes (Signed)
  Subjective:  Patient ID: Shawn Blanchard, male    DOB: 20-Apr-1935,  MRN: [id number]  Chief Complaint  Patient presents with   Diabetic Ulcer     2 weeks  for wound care    DOS: 08/11/2021 Procedure: Partial sesamoidectomy I&D of the MTPJ  86 y.o. male returns for post-op check.  Feels it is doing okay the drainage is slowing down they worried that the stitches pulled through  Review of Systems: Negative except as noted in the HPI. Denies N/V/F/Ch.   Objective:  There were no vitals filed for this visit. There is no height or weight on file to calculate BMI. Constitutional Well developed. Well nourished.  Vascular Foot warm and well perfused. Capillary refill normal to all digits.  Calf is soft and supple, no posterior calf or knee pain, negative Homans' sign  Neurologic Normal speech. Oriented to person, place, and time. Epicritic sensation to light touch grossly reduced bilaterally.  Dermatologic Plantar ulceration is fully healed.  Periwound erythema.  There is delayed healing in the central portions of the incision  Orthopedic: He has no pain or tenderness about the surgical site.    Assessment:   1. Diabetic ulcer of toe of left foot associated with type 2 diabetes mellitus, with fat layer exposed ([place])    Plan:  Patient was evaluated and treated and all questions answered.  S/p foot surgery left -All sutures removed today.  He does have some superficial delayed healing.  I recommend we continue local wound care with Prisma collagen application which was done today.  Change every 3 days.  He does have some persistent erythema which I think is more likely low-grade and chronic gout as opposed to infection there is no purulence or malodor.  His plantar ulceration has fully healed.  I will see him back in 2 to 3 weeks for ongoing wound care  No follow-ups on file.

## 2021-09-12 ENCOUNTER — Telehealth: Payer: Self-pay | Admitting: Family Medicine

## 2021-09-12 NOTE — Telephone Encounter (Signed)
Melody from [place] called and stated that pt been experiencing dizziness but no med changes

## 2021-09-12 NOTE — Telephone Encounter (Signed)
Do you have a phone number to return their call?

## 2021-09-15 ENCOUNTER — Encounter: Payer: Self-pay | Admitting: Podiatry

## 2021-09-16 MED ORDER — CEFADROXIL 500 MG PO CAPS: 500.0000 mg | ORAL_CAPSULE | Freq: Two times a day (BID) | ORAL | 0 refills | Status: DC

## 2021-09-16 MED ORDER — COLCHICINE 0.6 MG PO TABS: ORAL_TABLET | ORAL | 2 refills | Status: DC

## 2021-09-24 ENCOUNTER — Ambulatory Visit (INDEPENDENT_AMBULATORY_CARE_PROVIDER_SITE_OTHER): Payer: Medicare PPO | Admitting: Podiatry

## 2021-09-24 ENCOUNTER — Ambulatory Visit: Payer: Medicare PPO | Admitting: Family Medicine

## 2021-09-24 ENCOUNTER — Encounter: Payer: Self-pay | Admitting: Family Medicine

## 2021-09-24 ENCOUNTER — Encounter: Payer: Medicare PPO | Admitting: Podiatry

## 2021-09-24 VITALS — BP 134/66 | HR 48 | Temp 98.0°F | Ht 71.0 in | Wt 250.8 lb

## 2021-09-24 DIAGNOSIS — D690 Allergic purpura: Secondary | ICD-10-CM | POA: Diagnosis not present

## 2021-09-24 DIAGNOSIS — L97522 Non-pressure chronic ulcer of other part of left foot with fat layer exposed: Secondary | ICD-10-CM

## 2021-09-24 DIAGNOSIS — E11621 Type 2 diabetes mellitus with foot ulcer: Secondary | ICD-10-CM

## 2021-09-24 DIAGNOSIS — I776 Arteritis, unspecified: Secondary | ICD-10-CM | POA: Diagnosis not present

## 2021-09-24 DIAGNOSIS — M310 Hypersensitivity angiitis: Secondary | ICD-10-CM | POA: Diagnosis not present

## 2021-09-24 DIAGNOSIS — L309 Dermatitis, unspecified: Secondary | ICD-10-CM | POA: Diagnosis not present

## 2021-09-24 LAB — URINALYSIS, ROUTINE W REFLEX MICROSCOPIC
Bilirubin Urine: NEGATIVE
Ketones, ur: NEGATIVE
Nitrite: NEGATIVE
Specific Gravity, Urine: <=1.005 — AB (ref 1.000–1.030)
Total Protein, Urine: NEGATIVE
Urine Glucose: NEGATIVE
Urobilinogen, UA: 0.2 (ref 0.0–1.0)
pH: 6 (ref 5.0–8.0)

## 2021-09-24 LAB — MICROALBUMIN / CREATININE URINE RATIO
Creatinine,U: 57 mg/dL
Microalb Creat Ratio: 7.6 mg/g (ref 0.0–30.0)
Microalb, Ur: 4.3 mg/dL — ABNORMAL HIGH (ref 0.0–1.9)

## 2021-09-24 NOTE — Addendum Note (Signed)
Addended by: Lynnea Ferrier on: 09/24/2021 04:15 PM   Modules accepted: Orders

## 2021-09-24 NOTE — Progress Notes (Signed)
Established Patient Office Visit  Subjective   Patient ID: Shawn Blanchard, male    DOB: 1935-08-09  Age: 86 y.o. MRN: [id number]  Chief Complaint  Patient presents with   Rash    Rash/red spots on legs started yesterday, little swelling in left leg x 2-3 weeks.     Rash Pertinent negatives include no fever or joint pain.   for evaluation of a 1 day history of a rash on his legs from the knee down.  There are few spots on either arm.  Denies fevers chills, abdominal pain, hematuria and/or hematochezia.  He feels okay.  Recent cellulitis status post toe surgery.    Review of Systems  Constitutional: Negative.  Negative for chills, fever, malaise/fatigue and weight loss.  HENT: Negative.    Eyes:  Negative for blurred vision, discharge and redness.  Respiratory: Negative.    Cardiovascular: Negative.   Gastrointestinal: Negative.  Negative for abdominal pain and blood in stool.  Genitourinary: Negative.  Negative for hematuria.  Musculoskeletal: Negative.  Negative for joint pain and myalgias.  Skin:  Positive for rash.  Neurological:  Negative for tingling, loss of consciousness and weakness.  Endo/Heme/Allergies:  Negative for polydipsia.      Objective:     BP 134/66 (BP Location: Right Arm, Patient Position: Sitting, Cuff Size: Large)   Pulse (!) 48   Temp 98 F (36.7 C) (Temporal)   Ht '5\' 11"'$  (1.803 m)   Wt 250 lb 12.8 oz (113.8 kg)   SpO2 99%   BMI 34.98 kg/m    Physical Exam Constitutional:      General: He is not in acute distress.    Appearance: Normal appearance. He is not ill-appearing, toxic-appearing or diaphoretic.  HENT:     Head: Normocephalic and atraumatic.     Right Ear: External ear normal.     Left Ear: External ear normal.     Mouth/Throat:     Pharynx: No oropharyngeal exudate or posterior oropharyngeal erythema.  Eyes:     General: No scleral icterus.       Right eye: No discharge.        Left eye: No discharge.     Extraocular  Movements: Extraocular movements intact.     Conjunctiva/sclera: Conjunctivae normal.  Pulmonary:     Effort: Pulmonary effort is normal. No respiratory distress.  Musculoskeletal:     Cervical back: No rigidity or tenderness.  Skin:    General: Skin is warm and dry.       Neurological:     Mental Status: He is alert and oriented to person, place, and time.  Psychiatric:        Mood and Affect: Mood normal.        Behavior: Behavior normal.      No results found for any visits on 09/24/21.    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  Information was given.  Patient is doing well and has no reason to suspect internal organ involvement.  Believed to be secondary to recent cellulitis.  He is completing his course of antibiotics.  Will return with any change in his current status  Libby Maw, MD

## 2021-09-25 ENCOUNTER — Other Ambulatory Visit (INDEPENDENT_AMBULATORY_CARE_PROVIDER_SITE_OTHER): Payer: Medicare PPO

## 2021-09-25 ENCOUNTER — Other Ambulatory Visit: Payer: Self-pay

## 2021-09-25 DIAGNOSIS — D690 Allergic purpura: Secondary | ICD-10-CM | POA: Diagnosis not present

## 2021-09-25 DIAGNOSIS — R197 Diarrhea, unspecified: Secondary | ICD-10-CM

## 2021-09-25 DIAGNOSIS — D509 Iron deficiency anemia, unspecified: Secondary | ICD-10-CM | POA: Diagnosis not present

## 2021-09-25 DIAGNOSIS — I776 Arteritis, unspecified: Secondary | ICD-10-CM

## 2021-09-25 NOTE — Progress Notes (Signed)
  Subjective:  Patient ID: Shawn Blanchard, male    DOB: 1935/09/11,  MRN: [id number]  Chief Complaint  Patient presents with   Diabetic Ulcer    Follow up left foot    DOS: 08/11/2021 Procedure: Partial sesamoidectomy I&D of the MTPJ  86 y.o. male returns for post-op check.  A new issue developed over the weekend he has developed a rash on the bilateral lower legs.  He has been taking the [facility] as directed, he has been on this in the colchicine since last week the rash prompted an urgent visit with PCP yesterday, their primary concern was that this is a Henoch-Schnlein purpura and ordered lab work on him.  Review of Systems: Negative except as noted in the HPI. Denies N/V/F/Ch.   Objective:  There were no vitals filed for this visit. There is no height or weight on file to calculate BMI. Constitutional Well developed. Well nourished.  Vascular Foot warm and well perfused. Capillary refill normal to all digits.  Calf is soft and supple, no posterior calf or knee pain, negative Homans' sign  Neurologic Normal speech. Oriented to person, place, and time. Epicritic sensation to light touch grossly reduced bilaterally.  Dermatologic Plantar ulceration is fully healed.  Remaining incision is now healed.  There is a small area of delayed healing where previous suture was present approximately 4 mm in diameter.  Large petechial rash on bilateral lower extremities noted he has this on his arms as well  Orthopedic: He has no pain or tenderness about the surgical site.       Assessment:   1. Diabetic ulcer of toe of left foot associated with type 2 diabetes mellitus, with fat layer exposed ([place])   2. Vasculitis ([place])    Plan:  Patient was evaluated and treated and all questions answered.  S/p foot surgery left -His surgical site remains well-healed now.  He has a small area of delayed healing from previous suture site as well.  This debrided in a full-thickness manner using a  sharp scalpel.  Postdebridement measurements are noted above.  Granular wound bed.  Continue Prisma application to the site.   His new rash could be related to previous cellulitis infections or gout as well as recent medications.  I do not think likely that the cefadroxil caused this as it should have started much sooner after beginning the medication.  However given the lack of signs of infection of the foot I do recommend he stop taking this currently.  I did recommend biopsy of the site to rule out the possibility of vasculitis.  Following sterile prep with Betadine and a local field block with 1.5 cc each of 2% lidocaine and 0.5% Marcaine plain a 4 mm punch biopsy was used to biopsy one of the newest lesions on the proximal right lateral leg.  He tolerated this well.  It was closed with a 3-0 nylon suture.  Post care instructions given.  I will let him know with the biopsies results show.  No follow-ups on file.

## 2021-09-26 ENCOUNTER — Ambulatory Visit: Payer: Medicare PPO | Admitting: Gastroenterology

## 2021-09-26 ENCOUNTER — Encounter: Payer: Self-pay | Admitting: Family Medicine

## 2021-09-26 LAB — CBC
HCT: 35.6 % — ABNORMAL LOW (ref 39.0–52.0)
Hemoglobin: 11.4 g/dL — ABNORMAL LOW (ref 13.0–17.0)
MCHC: 32.2 g/dL (ref 30.0–36.0)
MCV: 80.9 fl (ref 78.0–100.0)
Platelets: 214 10*3/uL (ref 150.0–400.0)
RBC: 4.4 Mil/uL (ref 4.22–5.81)
RDW: 17.9 % — ABNORMAL HIGH (ref 11.5–15.5)
WBC: 6.9 10*3/uL (ref 4.0–10.5)

## 2021-09-26 LAB — COMPREHENSIVE METABOLIC PANEL
ALT: 13 U/L (ref 0–53)
AST: 21 U/L (ref 0–37)
Albumin: 3.8 g/dL (ref 3.5–5.2)
Alkaline Phosphatase: 88 U/L (ref 39–117)
BUN: 25 mg/dL — ABNORMAL HIGH (ref 6–23)
CO2: 28 mEq/L (ref 19–32)
Calcium: 9.2 mg/dL (ref 8.4–10.5)
Chloride: 97 mEq/L (ref 96–112)
Creatinine, Ser: 1.59 mg/dL — ABNORMAL HIGH (ref 0.40–1.50)
GFR: 39.85 mL/min — ABNORMAL LOW (ref >60.00)
Glucose, Bld: 108 mg/dL — ABNORMAL HIGH (ref 70–99)
Potassium: 3.8 mEq/L (ref 3.5–5.1)
Sodium: 135 mEq/L (ref 135–145)
Total Bilirubin: 0.8 mg/dL (ref 0.2–1.2)
Total Protein: 7.2 g/dL (ref 6.0–8.3)

## 2021-09-26 LAB — IRON,TIBC AND FERRITIN PANEL
%SAT: 9 % (calc) — ABNORMAL LOW (ref 20–48)
Ferritin: 80 ng/mL (ref 24–380)
Iron: 28 ug/dL — ABNORMAL LOW (ref 50–180)
TIBC: 295 mcg/dL (calc) (ref 250–425)

## 2021-09-26 LAB — IGA: Immunoglobulin A: 497 mg/dL — ABNORMAL HIGH (ref 70–320)

## 2021-09-27 LAB — TISSUE SPECIMEN

## 2021-09-27 LAB — PATHOLOGY REPORT

## 2021-10-02 ENCOUNTER — Encounter: Payer: Self-pay | Admitting: Family Medicine

## 2021-10-02 ENCOUNTER — Ambulatory Visit: Payer: Medicare PPO | Admitting: Family Medicine

## 2021-10-02 ENCOUNTER — Other Ambulatory Visit: Payer: Self-pay | Admitting: Cardiology

## 2021-10-02 VITALS — BP 134/76 | HR 68 | Temp 97.6°F | Ht 71.0 in | Wt 251.6 lb

## 2021-10-02 DIAGNOSIS — M19079 Primary osteoarthritis, unspecified ankle and foot: Secondary | ICD-10-CM

## 2021-10-02 DIAGNOSIS — D690 Allergic purpura: Secondary | ICD-10-CM

## 2021-10-02 LAB — POCT URINALYSIS DIPSTICK
Bilirubin, UA: NEGATIVE
Glucose, UA: NEGATIVE
Ketones, UA: NEGATIVE
Nitrite, UA: NEGATIVE
Protein, UA: POSITIVE — AB
Spec Grav, UA: 1.015 (ref 1.010–1.025)
Urobilinogen, UA: 0.2 E.U./dL
pH, UA: 6 (ref 5.0–8.0)

## 2021-10-04 ENCOUNTER — Other Ambulatory Visit: Payer: Medicare PPO

## 2021-10-04 ENCOUNTER — Telehealth: Payer: Self-pay | Admitting: Family Medicine

## 2021-10-04 DIAGNOSIS — D690 Allergic purpura: Secondary | ICD-10-CM

## 2021-10-04 NOTE — Telephone Encounter (Signed)
[name] (pt's spouse) wants to know if he should move up his nephrology appointment from 11/30/21 sooner? Please advise [name] at [phone]

## 2021-10-04 NOTE — Telephone Encounter (Signed)
Spoke to patients wife and she will call them to try and get the appointment moved up.  Dm/cma

## 2021-10-10 ENCOUNTER — Ambulatory Visit (INDEPENDENT_AMBULATORY_CARE_PROVIDER_SITE_OTHER): Payer: Medicare PPO | Admitting: Podiatry

## 2021-10-10 DIAGNOSIS — E11621 Type 2 diabetes mellitus with foot ulcer: Secondary | ICD-10-CM | POA: Diagnosis not present

## 2021-10-10 DIAGNOSIS — L97522 Non-pressure chronic ulcer of other part of left foot with fat layer exposed: Secondary | ICD-10-CM | POA: Diagnosis not present

## 2021-10-10 DIAGNOSIS — M79675 Pain in left toe(s): Secondary | ICD-10-CM

## 2021-10-10 DIAGNOSIS — M79674 Pain in right toe(s): Secondary | ICD-10-CM

## 2021-10-10 DIAGNOSIS — I776 Arteritis, unspecified: Secondary | ICD-10-CM

## 2021-10-10 DIAGNOSIS — B351 Tinea unguium: Secondary | ICD-10-CM | POA: Diagnosis not present

## 2021-10-10 NOTE — Progress Notes (Signed)
  Subjective:  Patient ID: Shawn [facility], male    DOB: [birth year],  MRN: [id number]  Chief Complaint  Patient presents with   Diabetic Ulcer        for wound care/ Post op    DOS: 08/11/2021 Procedure: Partial sesamoidectomy I&D of the MTPJ  86 y.o. male returns for post-op check.  He is doing much better since the last visit the wound on the foot has completely healed and his vasculitis has resolved  Review of Systems: Negative except as noted in the HPI. Denies N/V/F/Ch.   Objective:  There were no vitals filed for this visit. There is no height or weight on file to calculate BMI. Constitutional Well developed. Well nourished.  Vascular Foot warm and well perfused. Capillary refill normal to all digits.  Calf is soft and supple, no posterior calf or knee pain, negative Homans' sign  Neurologic Normal speech. Oriented to person, place, and time. Epicritic sensation to light touch grossly reduced bilaterally.  Dermatologic Plantar ulceration is fully healed.  Medial incision fully healed.  Thickened elongated toenails x10 with subungual debris and incurvated borders  Orthopedic: He has no pain or tenderness about the surgical site.    Pathology results show Henoch-Schnlein purpura   Assessment:   1. Vasculitis ([place])   2. Diabetic ulcer of toe of left foot associated with type 2 diabetes mellitus, with fat layer exposed ([place])   3. Pain due to onychomycosis of toenails of both feet    Plan:  Patient was evaluated and treated and all questions answered.  S/p foot surgery left Pathology results reviewed with him.  Has resolved completely now.  Suture removed uneventfully.  Return as needed for this.  His wounds have also healed again.  I discussed with him possibility of recurrence hopefully this has not occurred now that his sesamoid has been reduced in size.  We discussed that persistent swelling and redness may continue around the big toe joint due to his gout.  He  will discuss with his PCP further for regular evaluation and chronic treatment with medications if necessary.  Discussed the etiology and treatment options for the condition in detail with the patient. Educated patient on the topical and oral treatment options for mycotic nails. Recommended debridement of the nails today. Sharp and mechanical debridement performed of all painful and mycotic nails today. Nails debrided in length and thickness using a nail nipper to level of comfort. Discussed treatment options including appropriate shoe gear. Follow up as needed for painful nails.    Return in about 3 months (around 01/10/2022) for at risk diabetic foot care.

## 2021-10-12 LAB — TIQ-MISC

## 2021-10-15 LAB — PROTEIN, URINE, 24 HOUR: Protein, 24H Urine: 182 mg/24 h — ABNORMAL HIGH (ref 0–149)

## 2021-10-19 DIAGNOSIS — I129 Hypertensive chronic kidney disease with stage 1 through stage 4 chronic kidney disease, or unspecified chronic kidney disease: Secondary | ICD-10-CM | POA: Diagnosis not present

## 2021-10-19 DIAGNOSIS — N179 Acute kidney failure, unspecified: Secondary | ICD-10-CM | POA: Diagnosis not present

## 2021-10-19 DIAGNOSIS — N1831 Chronic kidney disease, stage 3a: Secondary | ICD-10-CM | POA: Diagnosis not present

## 2021-10-19 DIAGNOSIS — N183 Chronic kidney disease, stage 3 unspecified: Secondary | ICD-10-CM | POA: Diagnosis not present

## 2021-10-19 DIAGNOSIS — E669 Obesity, unspecified: Secondary | ICD-10-CM | POA: Diagnosis not present

## 2021-10-19 DIAGNOSIS — E1122 Type 2 diabetes mellitus with diabetic chronic kidney disease: Secondary | ICD-10-CM | POA: Diagnosis not present

## 2021-10-30 DIAGNOSIS — H43391 Other vitreous opacities, right eye: Secondary | ICD-10-CM | POA: Diagnosis not present

## 2021-10-30 DIAGNOSIS — Z961 Presence of intraocular lens: Secondary | ICD-10-CM | POA: Diagnosis not present

## 2021-10-30 DIAGNOSIS — H35363 Drusen (degenerative) of macula, bilateral: Secondary | ICD-10-CM | POA: Diagnosis not present

## 2021-10-30 DIAGNOSIS — H04123 Dry eye syndrome of bilateral lacrimal glands: Secondary | ICD-10-CM | POA: Diagnosis not present

## 2021-10-30 DIAGNOSIS — Z7984 Long term (current) use of oral hypoglycemic drugs: Secondary | ICD-10-CM | POA: Diagnosis not present

## 2021-10-30 DIAGNOSIS — E119 Type 2 diabetes mellitus without complications: Secondary | ICD-10-CM | POA: Diagnosis not present

## 2021-10-30 DIAGNOSIS — H26493 Other secondary cataract, bilateral: Secondary | ICD-10-CM | POA: Diagnosis not present

## 2021-10-30 DIAGNOSIS — H5203 Hypermetropia, bilateral: Secondary | ICD-10-CM | POA: Diagnosis not present

## 2021-10-30 DIAGNOSIS — H43812 Vitreous degeneration, left eye: Secondary | ICD-10-CM | POA: Diagnosis not present

## 2021-12-03 ENCOUNTER — Other Ambulatory Visit: Payer: Self-pay | Admitting: Cardiology

## 2021-12-03 ENCOUNTER — Other Ambulatory Visit: Payer: Self-pay | Admitting: Family Medicine

## 2021-12-03 DIAGNOSIS — D509 Iron deficiency anemia, unspecified: Secondary | ICD-10-CM

## 2021-12-03 NOTE — Telephone Encounter (Signed)
Rx refill sent to pharmacy. 

## 2021-12-21 ENCOUNTER — Ambulatory Visit: Payer: Medicare PPO

## 2021-12-28 ENCOUNTER — Ambulatory Visit: Payer: Medicare PPO | Admitting: Family Medicine

## 2022-01-08 DIAGNOSIS — N1831 Chronic kidney disease, stage 3a: Secondary | ICD-10-CM | POA: Diagnosis not present

## 2022-01-10 DIAGNOSIS — E669 Obesity, unspecified: Secondary | ICD-10-CM | POA: Diagnosis not present

## 2022-01-10 DIAGNOSIS — I129 Hypertensive chronic kidney disease with stage 1 through stage 4 chronic kidney disease, or unspecified chronic kidney disease: Secondary | ICD-10-CM | POA: Diagnosis not present

## 2022-01-10 DIAGNOSIS — E1122 Type 2 diabetes mellitus with diabetic chronic kidney disease: Secondary | ICD-10-CM | POA: Diagnosis not present

## 2022-01-10 DIAGNOSIS — N183 Chronic kidney disease, stage 3 unspecified: Secondary | ICD-10-CM | POA: Diagnosis not present

## 2022-01-10 DIAGNOSIS — N1831 Chronic kidney disease, stage 3a: Secondary | ICD-10-CM | POA: Diagnosis not present

## 2022-01-11 ENCOUNTER — Encounter: Payer: Self-pay | Admitting: Family Medicine

## 2022-01-11 ENCOUNTER — Ambulatory Visit: Payer: Medicare PPO | Admitting: Family Medicine

## 2022-01-11 VITALS — BP 118/64 | HR 73 | Temp 97.2°F | Ht 71.0 in | Wt 250.2 lb

## 2022-01-11 DIAGNOSIS — G4733 Obstructive sleep apnea (adult) (pediatric): Secondary | ICD-10-CM | POA: Diagnosis not present

## 2022-01-11 DIAGNOSIS — D690 Allergic purpura: Secondary | ICD-10-CM | POA: Insufficient documentation

## 2022-01-11 DIAGNOSIS — I5032 Chronic diastolic (congestive) heart failure: Secondary | ICD-10-CM

## 2022-01-11 DIAGNOSIS — Z23 Encounter for immunization: Secondary | ICD-10-CM

## 2022-01-11 DIAGNOSIS — N1831 Chronic kidney disease, stage 3a: Secondary | ICD-10-CM

## 2022-01-11 DIAGNOSIS — E1142 Type 2 diabetes mellitus with diabetic polyneuropathy: Secondary | ICD-10-CM

## 2022-01-11 DIAGNOSIS — I10 Essential (primary) hypertension: Secondary | ICD-10-CM

## 2022-01-11 LAB — HEMOGLOBIN A1C: Hgb A1c MFr Bld: 6.4 % (ref 4.6–6.5)

## 2022-01-11 LAB — GLUCOSE, RANDOM: Glucose, Bld: 107 mg/dL — ABNORMAL HIGH (ref 70–99)

## 2022-01-11 NOTE — Progress Notes (Signed)
[facility] [address] [place] [address] Dept: [phone] Dept Fax: [phone]  Chronic Care Office Visit  Subjective:    Patient ID: Shawn Blanchard, male    DOB: 04-12-1935, 86 y.o..   MRN: [id number]  Chief Complaint  Patient presents with   Follow-up    6 month f/u.   Fasting today. Average BS 157- 130.    History of Present Illness:  Patient is in today for reassessment of chronic medical issues.  Shawn Blanchard has an extensive history of CV disease. He had a prior heart attack and is s/p PTCA with stent placement. 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 He is currently managed on carvedilol 3.125 mg bid and Lasix 20 mg bid. He is also on dabigatran (Pradaxa) 150 mg bid for anticoagulation. He has hyperlipidemia, but is intolerant of statins, so is managed on Repatha.   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 He is managed on sitagliptin/metformin (Janumet XR) [phone] mg daily and metformin 500 mg q pm. His diabetes is complicated with peripheral neuropathy and chronic kidney disease.   Shawn Bl[facility]ard has an episode of Henoch-Schnlein purpura earlier int he Spring. This has since resolved. He was seen by nephrology, as he did have some increased proteinuria and has Stage 3 CKD. Currently Dr. [name] feels he is at goal for management.  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[place]) 01/11/2022   Ulcer of left foot with fat layer exposed ([place])    Disorder of sesamoid bone of foot    Gout of left foot    Diabetic foot infection ([place]) 08/08/2021   Class 1 obesity 08/08/2021   Aortic atherosclerosis ([place]) 06/22/2021   SBO (small bowel obstruction) ([place]) 05/17/2021   Hypertensive urgency 05/17/2021   UTI (urinary tract infection) 05/17/2021    Normocytic anemia 05/17/2021   History of lymphoma 05/17/2021   Iron deficiency anemia 04/27/2021   History of squamous cell carcinoma in situ 04/18/2021   Statin intolerance 03/09/2021   Peritoneal adhesion 11/08/2020   Peripheral venous insufficiency 11/08/2020   Type 2 diabetes mellitus with stage 3a chronic kidney disease, without long-term current use of insulin ([place]) 02/22/2020   Old MI (myocardial infarction)    Floppy eyelid syndrome of both eyes 10/05/2019   Sensorineural hearing loss (SNHL), bilateral 10/04/2019   Stage 3a chronic kidney disease ([place]) 06/13/2019   Carpal tunnel syndrome of right wrist 03/25/2019   Diabetic polyneuropathy associated with type 2 diabetes mellitus ([place]) 12/16/2018   Blepharitis of both upper and lower eyelid 10/02/2018   Meibomian gland dysfunction (MGD) of both eyes 10/02/2018   Essential hypertension 11/07/2017   Acute on chronic systolic CHF (congestive heart failure) ([place]) 11/07/2017   Orthostatic dizziness 09/15/2017   Dermatochalasis of both upper eyelids 11/21/2016   Chronic anticoagulation 10/11/2016   Male erectile disorder 06/17/2016   Hyperlipidemia 04/11/2016   Small bowel obstruction ([place]) 04/11/2016   BMI 35.0-35.9,adult 04/11/2016   Leukocytosis 04/11/2016   Arthritis 03/12/2016   Pseudophakia of both eyes 03/12/2016   Insomnia 11/27/2015   Ganglion cyst 11/27/2015   Chronic diastolic heart failure ([place]) 09/01/2015   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[place]) 08/30/2015   Obstructive sleep apnea 08/30/2015   Pulmonary hypertension ([place]) 08/30/2015   Restless leg syndrome 08/30/2015  Chronic atrial fibrillation (HCC) 06/12/2015   Gastroesophageal reflux disease without esophagitis 06/12/2015   Idiopathic chronic gout 05/17/2015   Coronary artery disease involving native coronary artery of native heart with angina pectoris ([place]) 02/15/2015   Hypertensive heart disease with heart  failure ([place]) 02/15/2015   Type 2 diabetes mellitus with peripheral neuropathy ([place]) 12/02/2014    Past Surgical History:  Procedure Laterality Date   ABDOMINAL ADHESION SURGERY     x 2   BLEPHAROPLASTY Bilateral    CARPAL TUNNEL RELEASE Right 2021   CATARACT EXTRACTION Bilateral    CERVICAL SPINE SURGERY     CHOLECYSTECTOMY     CORONARY ANGIOPLASTY WITH STENT PLACEMENT  2000   IRRIGATION AND DEBRIDEMENT FOOT Left 08/11/2021   Procedure: IRRIGATION AND DEBRIDEMENT FOOT, AND BONE BIOPSY, PARTIAL SESMOIDECTOMY;  Surgeon: Criselda Peaches, DPM;  Location: [facility];  Service: Podiatry;  Laterality: Left;   NASAL SINUS SURGERY     ORIF TIBIAL SHAFT FRACTURE W/ PLATES AND SCREWS Left    PARTIAL KNEE ARTHROPLASTY Bilateral    SMALL INTESTINE SURGERY     Adenocarcinoma   TONSILLECTOMY      Family History  Problem Relation Age of Onset   Cancer Mother        Ovarian   Diabetes Mother    Heart disease Mother    Heart disease Father    Stroke Father    Diabetes Father    Diabetes Sister    Diabetes Brother    Diabetes Brother    Cancer Paternal Grandfather        Prostate   Colon cancer Neg Hx    Rectal cancer Neg Hx    Pancreatic cancer Neg Hx    Liver cancer Neg Hx    Stomach cancer Neg Hx     Outpatient Medications Prior to Visit  Medication Sig Dispense Refill   albuterol (VENTOLIN HFA) 108 (90 Base) MCG/ACT inhaler Inhale 2 puffs into the lungs every 6 (six) hours as needed for wheezing or shortness of breath. 8 g 2   allopurinol (ZYLOPRIM) 100 MG tablet Take 1 tablet (100 mg total) by mouth daily. 90 tablet 3   Alpha-Lipoic Acid 600 MG TABS Take 600 mg by mouth in the morning and at bedtime.     b complex vitamins capsule Take 1 capsule by mouth daily.     carvedilol (COREG) 3.125 MG tablet Take 1 tablet (3.125 mg total) by mouth 2 (two) times daily with a meal. 180 tablet 2   colchicine 0.6 MG tablet TAKE 2 TABLETS THEN 1 TABLET 1 HOUR LATER THEN 1 TABLET DAILY X 7 DAYS      dabigatran (PRADAXA) 150 MG CAPS capsule TAKE 1 CAPSULE BY MOUTH 2 TIMES DAILY 180 capsule 2   FEROSUL 325 (65 Fe) MG tablet TAKE 1 TABLET BY MOUTH 2 TIMES A DAY 60 tablet 2   furosemide (LASIX) 20 MG tablet Take 20 mg by mouth 2 (two) times daily. If weight is up more than 3 pounds then take an extra tablet     glucose blood test strip OneTouch Ultra Blue Test Strip     hydrocortisone 2.5 % ointment Apply 1 application topically 2 (two) times daily as needed (Skin Irritation).     JANUMET XR [phone] MG TB24 Take 1 tablet by mouth every morning.     Lancets (ONETOUCH DELICA PLUS [id number]) MISC OneTouch Delica Plus Lancet 33 gauge     meclizine (ANTIVERT) 12.5 MG tablet Take 1 tablet (  12.5 mg total) by mouth 3 (three) times daily as needed for dizziness. 30 tablet 0   Melatonin 5 MG CAPS Take 5 mg by mouth at bedtime.     metFORMIN (GLUCOPHAGE-XR) 500 MG 24 hr tablet Take 1 tablet (500 mg total) by mouth daily in the afternoon. 90 tablet 3   methylcellulose (CITRUCEL) oral powder Take 1 packet by mouth daily.     nitroGLYCERIN (NITROSTAT) 0.4 MG SL tablet PLACE 1 TABLET UNDER TONGUE EVERY 5 MINUTES AS NEEDED FOR CHEST PAIN (UP TO 3 DOSES) 25 tablet 11   ondansetron (ZOFRAN) 4 MG tablet Take 1 tablet (4 mg total) by mouth every 6 (six) hours as needed for nausea or vomiting. 20 tablet 0   polyethylene glycol (MIRALAX) 17 g packet Take 17 g by mouth daily as needed. 14 each 0   polyvinyl alcohol (LIQUIFILM TEARS) 1.4 % ophthalmic solution Place 1 drop into both eyes as needed for dry eyes.     REPATHA SURECLICK 140 MG/ML SOAJ Inject 140 mg into the skin every 14 (fourteen) days. 2 mL 6   Spacer/Aero-Holding Chambers DEVI Use with albuterol inhaler 1 each 0   traZODone (DESYREL) 150 MG tablet      Turmeric 400 MG CAPS Take 400 mg by mouth daily.     Saxagliptin-Metformin (KOMBIGLYZE XR) 2.08-998 MG TB24      empagliflozin (JARDIANCE) 25 MG TABS tablet      leptospermum manuka honey (MEDIHONEY)  PSTE paste Apply 1 application. topically daily. Cleanse wound to left great toe with soap and water and pat dry.  Apply medihoney to wound bed and cover with dry dressing daily. Apply thin layer (3 mm) to wound. 44 mL 3   metoprolol succinate (TOPROL-XL) 50 MG 24 hr tablet  (Patient not taking: Reported on 01/11/2022)     No facility-administered medications prior to visit.    Allergies  Allergen Reactions   Atorvastatin Other (See Comments)    Dizziness.   Jardiance [Empagliflozin] Other (See Comments)    Orthostasis   Ace Inhibitors Rash   Amoxicillin Nausea Only   Meperidine Rash   Naproxen Sodium Rash   Sulfa Antibiotics Nausea Only   Tetracycline Nausea Only      Objective:   Today's Vitals   01/11/22 0832  BP: 118/64  Pulse: 73  Temp: (!) 97.2 F (36.2 C)  TempSrc: Temporal  SpO2: 98%  Weight: 250 lb 3.2 oz (113.5 kg)  Height: 5' 11" (1.803 m)   Body mass index is 34.9 kg/m.   General: Well developed, well nourished. No acute distress. Feet- Skin intact, but shiny and with bluish tint. Mild pitting edema noted. No sign of maceration   between toes. Nails are normal. Dorsalis pedis and posterior tibial artery pulses are not palpable due   to the edema. 5.07 monofilament testing abnormal, with most of the left foot and about half of the   right foot being insensate. Psych: Alert and oriented. Normal mood and affect.  Health Maintenance Due  Topic Date Due   INFLUENZA VACCINE  11/06/2021   FOOT EXAM  11/08/2021   HEMOGLOBIN A1C  12/23/2021   Lab Results  Ref Range & Units 3 d ago  Sodium 135 - 146 MMOL/L 143   Potassium 3.5 - 5.3 MMOL/L 4.9   Comment: SLIGHT HEMOLYSIS  Chloride 98 - 110 MMOL/L 106   CO2 21 - 31 MMOL/L 26   BUN 8 - 24 MG/DL 20   Glucose 70 - 99   MG/DL 105 High    Comment: SLIGHT HEMOLYSIS  Calcium 8.5 - 10.5 MG/DL 9.3   Phosphorus 2.5 - 5.0 MG/DL 3.7   Comment: SLIGHT HEMOLYSIS  Albumin  3.5 - 5.7 G/DL 4.1   Comment: SLIGHT HEMOLYSIS   Creatinine 0.70 - 1.30 MG/DL 1.31 High    Comment: SLIGHT HEMOLYSIS  Anion Gap 4 - 14 MMOL/L 11   Est. GFR >=60 ML/MIN/1.73 M*2 54 Low     Ref Range & Units 3 d ago  WBC 4.4 - 11.0 x 10*3/uL 6.7   RBC 4.50 - 5.90 x 10*6/uL 4.40 Low    Hemoglobin 14.0 - 17.5 G/DL 12.3 Low    Hematocrit 41.5 - 50.4 % 37.3 Low    MCV 80.0 - 96.0 FL 84.7   MCH 27.5 - 33.2 PG 28.0   MCHC 33.0 - 37.0 G/DL 33.0   RDW 12.3 - 17.0 % 17.5 High    Platelets 150 - 450 X 10*3/uL 199   MPV 6.8 - 10.2 FL 8.0       Assessment & Plan:   1. Type 2 diabetes mellitus with peripheral neuropathy (HCC) We will check the A1c today. Continue Janumet XR 01-999 mg q am and metformin 500 mg q pm.  - Glucose, random - Hemoglobin A1c  2. Diabetic polyneuropathy associated with type 2 diabetes mellitus (HCC) Follows with podiatry. No sign of infection at present.  3. Chronic diastolic heart failure (HCC) Compensated. Continue carvedilol 3.125 mg bid and Lasix 20 mg bid.  4. Essential hypertension Blood pressure at goal. Continue carvedilol 3.125 mg bid.  5. Obstructive sleep apnea Shawn Blanchard notes it has been some years since he had his CPAP titrated to make sure that his pressure needs are still being met. He also notes difficulties finding some of the tubing/masks that he needs. I will refer him to pulmonology for reassessment of his OSA and to help with his equipment needs.  - Ambulatory referral to Pulmonology  6. Stage 3a chronic kidney disease (HCC) Blood pressure control good. He will continue to follow with nephrology.  7. Henoch-Schonlein purpura (HCC) Appears to have resolved without consequence.  Return in about 3 months (around 04/13/2022) for Reassessment.   Stephen M [name], MD 

## 2022-01-24 ENCOUNTER — Ambulatory Visit: Payer: Medicare PPO | Admitting: Podiatry

## 2022-01-24 DIAGNOSIS — M79674 Pain in right toe(s): Secondary | ICD-10-CM | POA: Diagnosis not present

## 2022-01-24 DIAGNOSIS — R609 Edema, unspecified: Secondary | ICD-10-CM | POA: Diagnosis not present

## 2022-01-24 DIAGNOSIS — M79675 Pain in left toe(s): Secondary | ICD-10-CM | POA: Diagnosis not present

## 2022-01-24 DIAGNOSIS — B351 Tinea unguium: Secondary | ICD-10-CM | POA: Diagnosis not present

## 2022-01-24 NOTE — Progress Notes (Signed)
  Subjective:  Patient ID: Shawn Blanchard, male    DOB: 08/28/1935,  MRN: [id number]  Chief Complaint  Patient presents with   Nail Problem    Diabetic Foot Care- Nail trim- Latest A1C was 6.4 (checked last week)    86 y.o. male presents with the above complaint. History confirmed with patient.  Returns for at risk foot care.  His rash and ulcerations have resolved completely.  He is having more swelling in the extremities and ankles  Objective:  Physical Exam: warm, good capillary refill, no trophic changes or ulcerative lesions, venous stasis dermatitis noted, and varicose veins noted.  Pulses are weakly palpable.  Thickened elongated nails x10 with yellow-brown discoloration and subungual debris. Assessment:   1. Pain due to onychomycosis of toenails of both feet   2. Peripheral edema      Plan:  Patient was evaluated and treated and all questions answered.  Discussed the etiology and treatment options for the condition in detail with the patient. Educated patient on the topical and oral treatment options for mycotic nails. Recommended debridement of the nails today. Sharp and mechanical debridement performed of all painful and mycotic nails today. Nails debrided in length and thickness using a nail nipper to level of comfort. Discussed treatment options including appropriate shoe gear. Follow up as needed for painful nails.   We discussed peripheral edema and treatment of this including use of compression stockings which she is doing and has had a difficult time with as well as trying to stay active walking and elevating her legs after activity.  Discussed possibly using CircAid type garments to alleviate this as well which may be easier for him to put on.  They will look into this and let me know if a have any issues.  I will see him back in 3 months for routine follow-up  No follow-ups on file.

## 2022-01-25 ENCOUNTER — Other Ambulatory Visit: Payer: Self-pay | Admitting: Cardiology

## 2022-01-25 DIAGNOSIS — E782 Mixed hyperlipidemia: Secondary | ICD-10-CM

## 2022-02-05 DIAGNOSIS — M6740 Ganglion, unspecified site: Secondary | ICD-10-CM | POA: Diagnosis not present

## 2022-02-05 DIAGNOSIS — L578 Other skin changes due to chronic exposure to nonionizing radiation: Secondary | ICD-10-CM | POA: Diagnosis not present

## 2022-02-05 DIAGNOSIS — L723 Sebaceous cyst: Secondary | ICD-10-CM | POA: Diagnosis not present

## 2022-02-05 DIAGNOSIS — L309 Dermatitis, unspecified: Secondary | ICD-10-CM | POA: Diagnosis not present

## 2022-02-05 DIAGNOSIS — L821 Other seborrheic keratosis: Secondary | ICD-10-CM | POA: Diagnosis not present

## 2022-02-08 ENCOUNTER — Ambulatory Visit (INDEPENDENT_AMBULATORY_CARE_PROVIDER_SITE_OTHER): Payer: Medicare PPO | Admitting: Pulmonary Disease

## 2022-02-08 ENCOUNTER — Encounter: Payer: Self-pay | Admitting: Pulmonary Disease

## 2022-02-08 VITALS — BP 120/70 | HR 68 | Temp 97.7°F | Ht 71.0 in | Wt 254.0 lb

## 2022-02-08 DIAGNOSIS — G4733 Obstructive sleep apnea (adult) (pediatric): Secondary | ICD-10-CM

## 2022-02-08 DIAGNOSIS — I2729 Other secondary pulmonary hypertension: Secondary | ICD-10-CM

## 2022-02-08 DIAGNOSIS — I5032 Chronic diastolic (congestive) heart failure: Secondary | ICD-10-CM | POA: Diagnosis not present

## 2022-02-08 NOTE — Patient Instructions (Signed)
We will contact [facility]  DME referral for CPAP supplies  We will get a download from the machine from [place] using your CPAP nightly  Continue regular exercises as tolerated  Weight loss efforts as tolerated  I will see you back in about 3 months

## 2022-02-08 NOTE — Progress Notes (Signed)
MARCUS SCHWANDT    [id number]    1936/02/20  Primary Care Physician:[name], [name], MD  Referring Physician: Haydee Salter, [place],  [place] [address]  Chief complaint:   Patient with a history of obstructive sleep apnea  HPI:  Diagnosed with obstructive sleep apnea about 1993 Has used CPAP regularly since then  Has been having difficulty getting supplies from [facility]  Usually goes to bed between 8 and 9 PM, takes him about 20 minutes to fall asleep Wakes up about 3 times during the night Final wake up time about 6 AM  Weight is down about 20 pounds  Try to stay active  Multiple comorbidities including history of hypertension, coronary artery disease, heart failure, atrial fibrillation, diabetes, neuropathy, restless legs  He still does have daytime fatigue  He does exercise regularly goes to the gym about 3 days a week  Did not smoke in the past  Outpatient Encounter Medications as of 02/08/2022  Medication Sig   albuterol (VENTOLIN HFA) 108 (90 Base) MCG/ACT inhaler Inhale 2 puffs into the lungs every 6 (six) hours as needed for wheezing or shortness of breath.   allopurinol (ZYLOPRIM) 100 MG tablet Take 1 tablet (100 mg total) by mouth daily.   Alpha-Lipoic Acid 600 MG TABS Take 600 mg by mouth in the morning and at bedtime.   b complex vitamins capsule Take 1 capsule by mouth daily.   carvedilol (COREG) 3.125 MG tablet Take 1 tablet (3.125 mg total) by mouth 2 (two) times daily with a meal.   colchicine 0.6 MG tablet TAKE 2 TABLETS THEN 1 TABLET 1 HOUR LATER THEN 1 TABLET DAILY X 7 DAYS   Cyanocobalamin (B12 LIQUID HEALTH BOOSTER PO) Take 1 Drop/kg by mouth daily.   dabigatran (PRADAXA) 150 MG CAPS capsule TAKE 1 CAPSULE BY MOUTH 2 TIMES DAILY   FEROSUL 325 (65 Fe) MG tablet TAKE 1 TABLET BY MOUTH 2 TIMES A DAY   furosemide (LASIX) 20 MG tablet Take 20 mg by mouth 2 (two) times daily. If weight is up more than 3 pounds  then take an extra tablet   glucose blood test strip OneTouch Ultra Blue Test Strip   hydrocortisone 2.5 % ointment Apply 1 application topically 2 (two) times daily as needed (Skin Irritation).   JANUMET XR [phone] MG TB24 Take 1 tablet by mouth every morning.   lactobacillus acidophilus (BACID) TABS tablet Take 1 tablet by mouth 3 (three) times daily.   Lancets (ONETOUCH DELICA PLUS [id number]) MISC OneTouch Delica Plus Lancet 33 gauge   meclizine (ANTIVERT) 12.5 MG tablet Take 1 tablet (12.5 mg total) by mouth 3 (three) times daily as needed for dizziness.   Melatonin 5 MG CAPS Take 5 mg by mouth at bedtime.   metFORMIN (GLUCOPHAGE-XR) 500 MG 24 hr tablet Take 1 tablet (500 mg total) by mouth daily in the afternoon.   methylcellulose (CITRUCEL) oral powder Take 1 packet by mouth daily.   nitroGLYCERIN (NITROSTAT) 0.4 MG SL tablet PLACE 1 TABLET UNDER TONGUE EVERY 5 MINUTES AS NEEDED FOR CHEST PAIN (UP TO 3 DOSES)   ondansetron (ZOFRAN) 4 MG tablet Take 1 tablet (4 mg total) by mouth every 6 (six) hours as needed for nausea or vomiting.   OVER THE COUNTER MEDICATION Take 25 mg by mouth daily. Balance of Nature: Fruits and Vegs supplements   polyethylene glycol (MIRALAX) 17 g packet Take 17 g by mouth daily as  needed.   polyvinyl alcohol (LIQUIFILM TEARS) 1.4 % ophthalmic solution Place 1 drop into both eyes as needed for dry eyes.   REPATHA SURECLICK 735 MG/ML SOAJ INJECT '140MG'$  INTO THE SKIN EVERY 14 DAYS   Spacer/Aero-Holding Chambers DEVI Use with albuterol inhaler   Turmeric 400 MG CAPS Take 400 mg by mouth daily.   No facility-administered encounter medications on file as of 02/08/2022.    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   Aortic atherosclerosis ([place]) 06/22/2021   Arthritis 03/12/2016   Blepharitis of both upper and lower eyelid 10/02/2018   BMI 35.0-35.9,adult 04/11/2016   Carpal tunnel syndrome of right wrist 03/25/2019   Chronic anticoagulation 10/11/2016   Chronic atrial fibrillation ([place]) 06/12/2015   Overview:  Managed CARDS   Chronic diastolic heart failure ([place]) 09/01/2015   Class 1 obesity 08/08/2021   Coronary artery disease involving native coronary artery of native heart with angina pectoris ([place]) 02/15/2015   Overview:  Hx of MI with stent to LAD 2002 MPS 2016 with EF 47% and no ischemia   Dermatochalasis of both upper eyelids 11/21/2016   Diabetic foot infection ([place]) 08/08/2021   Diabetic polyneuropathy associated with type 2 diabetes mellitus ([place]) 12/16/2018   2020   Disorder of sesamoid bone of foot    Essential hypertension 11/07/2017   Floppy eyelid syndrome of both eyes 10/05/2019   Ganglion cyst 11/27/2015   Overview:  2017: dorsum right hand   Gastroesophageal reflux disease without esophagitis 06/12/2015   Gout of left foot    History of lymphoma 05/17/2021   History of squamous cell carcinoma in situ 04/18/2021   Hyperlipidemia 04/11/2016   Hypertensive heart disease with heart failure ([place]) 02/15/2015   Hypertensive urgency 05/17/2021   Idiopathic chronic gout 05/17/2015   Insomnia 11/27/2015   Iron deficiency anemia 04/27/2021   Leukocytosis 04/11/2016   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[place]) 08/30/2015   Male erectile disorder 06/17/2016   Meibomian gland dysfunction (MGD) of both eyes 10/02/2018   Normocytic anemia 05/17/2021   Obstructive sleep apnea 08/30/2015   Overview:  CPAP   Old MI (myocardial infarction)     Orthostatic dizziness 09/15/2017   2018: med related   Peripheral venous insufficiency 11/08/2020   Peritoneal adhesion 11/08/2020   Pseudophakia of both eyes 03/12/2016   Pulmonary hypertension ([place]) 08/30/2015   Overview:  Managed CARDS   Restless leg syndrome 08/30/2015   SBO (small bowel obstruction) ([place]) 12/14/2019   Sensorineural hearing loss (SNHL), bilateral 10/04/2019   Small bowel obstruction ([place]) 04/11/2016   Stage 3a chronic kidney disease ([place]) 06/13/2019   Statin intolerance 03/09/2021   Type 2 diabetes mellitus with neurologic complication, without long-term current use of insulin ([employer]) 12/02/2014   Type 2 diabetes mellitus with stage 3a chronic kidney disease, without long-term current use of insulin ([place]) 02/22/2020   Ulcer of left foot with fat layer exposed ([place])    UTI (urinary tract infection) 05/17/2021    Past Surgical History:  Procedure Laterality  Date   ABDOMINAL ADHESION SURGERY     x 2   BLEPHAROPLASTY Bilateral    CARPAL TUNNEL RELEASE Right 2021   CATARACT EXTRACTION Bilateral    CERVICAL SPINE SURGERY     CHOLECYSTECTOMY     CORONARY ANGIOPLASTY WITH STENT PLACEMENT  2000   IRRIGATION AND DEBRIDEMENT FOOT Left 08/11/2021   Procedure: IRRIGATION AND DEBRIDEMENT FOOT, AND BONE BIOPSY, PARTIAL SESMOIDECTOMY;  Surgeon: Criselda Peaches, DPM;  Location: [facility];  Service: Podiatry;  Laterality: Left;   NASAL SINUS SURGERY     ORIF TIBIAL SHAFT FRACTURE W/ PLATES AND SCREWS Left    PARTIAL KNEE ARTHROPLASTY Bilateral    SMALL INTESTINE SURGERY     Adenocarcinoma   TONSILLECTOMY      Family History  Problem Relation Age of Onset   Cancer Mother        Ovarian   Diabetes Mother    Heart disease Mother    Heart disease Father    Stroke Father    Diabetes Father    Diabetes Sister    Diabetes Brother    Diabetes Brother    Cancer Paternal Grandfather        Prostate   Colon cancer Neg Hx    Rectal cancer Neg Hx    Pancreatic cancer Neg Hx    Liver  cancer Neg Hx    Stomach cancer Neg Hx     Social History   Socioeconomic History   Marital status: Married    Spouse name: Not on file   Number of children: 2   Years of education: Not on file   Highest education level: Not on file  Occupational History   Not on file  Tobacco Use   Smoking status: Never   Smokeless tobacco: Never  Vaping Use   Vaping Use: Never used  Substance and Sexual Activity   Alcohol use: Not Currently   Drug use: Never   Sexual activity: Not on file  Other Topics Concern   Not on file  Social History Narrative   Former high school principal.   Social Determinants of Health   Financial Resource Strain: Low Risk  (03/07/2021)   Overall Financial Resource Strain (CARDIA)    Difficulty of Paying Living Expenses: Not hard at all  Food Insecurity: No Food Insecurity (03/07/2021)   Hunger Vital Sign    Worried About Running Out of Food in the Last Year: Never true    Ran Out of Food in the Last Year: Never true  Transportation Needs: No Transportation Needs (03/07/2021)   PRAPARE - Hydrologist (Medical): No    Lack of Transportation (Non-Medical): No  Physical Activity: Insufficiently Active (03/07/2021)   Exercise Vital Sign    Days of Exercise per Week: 3 days    Minutes of Exercise per Session: 40 min  Stress: No Stress Concern Present (03/07/2021)   [place]    Feeling of Stress : Not at all  Social Connections: [place] (03/07/2021)   Social Connection and Isolation Panel [NHANES]    Frequency of Communication with Friends and Family: Twice a week    Frequency of Social Gatherings with Friends and Family: Twice a week    Attends Religious Services: More than 4 times per year    Active Member of [employer] or Organizations: Yes    Attends Archivist Meetings: More than 4 times per year    Marital  Status: Married  Arboriculturist Violence: Not At Risk (03/07/2021)   Humiliation, Afraid, Rape, and Kick questionnaire    Fear of Current or Ex-Partner: No    Emotionally Abused: No    Physically Abused: No    Sexually Abused: No    Review of Systems  Constitutional:  Negative for fatigue.  Respiratory:  Positive for apnea and shortness of breath.   Psychiatric/Behavioral:  Positive for sleep disturbance.     Vitals:   02/08/22 0859  BP: 120/70  Pulse: 68  Temp: 97.7 F (36.5 C)  SpO2: 98%     Physical Exam Constitutional:      Appearance: He is obese.  HENT:     Head: Normocephalic.     Mouth/Throat:     Mouth: Mucous membranes are moist.  Cardiovascular:     Rate and Rhythm: Normal rate and regular rhythm.     Heart sounds: No murmur heard.    No friction rub.  Pulmonary:     Effort: No respiratory distress.     Breath sounds: No stridor. No wheezing or rhonchi.  Musculoskeletal:     Cervical back: No rigidity or tenderness.  Neurological:     Mental Status: He is alert.  Psychiatric:        Mood and Affect: Mood normal.      Data Reviewed: CPAP compliance shows 100% compliance Average use of 8 hours 48 minutes Pressure setting of 12 Occasional mask leaks Residual AHI of 4.6  Most recent echocardiogram with ejection fraction of 45 to 50% decreased ventricular function Moderate pulmonary hypertension  Assessment:  History of obstructive sleep apnea -Compliant with CPAP use  Download from the machine shows that CPAP is working well  DME referral for CPAP supplies  Continue to monitor compliance on a regular basis  Follow-up in 3 months  Graded exercises as tolerated  Pulmonary hypertension -Likely multifactorial -Continue management of heart failure -Continue management for obstructive sleep apnea   Plan/Recommendations: Continue CPAP therapy  Graded exercises as tolerated  Encourage weight loss efforts as tolerated  Follow-up in about 3 months  Call  with significant concerns  Prescription will be sent to [facility] for CPAP supplies   Sherrilyn Rist MD [facility] 02/08/2022, 9:09 AM  CC: Haydee Salter, MD

## 2022-02-25 ENCOUNTER — Ambulatory Visit: Payer: Medicare PPO | Admitting: Cardiology

## 2022-03-07 ENCOUNTER — Telehealth: Payer: Self-pay | Admitting: Family Medicine

## 2022-03-07 NOTE — Telephone Encounter (Signed)
Left message for patient to call back and schedule Medicare Annual Wellness Visit (AWV) in office.   If not able to come in office, please offer to do virtually or by telephone.  Left office number and my jabber [phone].  Last AWV:03/07/2021   Please schedule at anytime with Nurse Health Advisor.

## 2022-03-08 ENCOUNTER — Other Ambulatory Visit: Payer: Self-pay | Admitting: Cardiology

## 2022-03-18 ENCOUNTER — Encounter: Payer: Self-pay | Admitting: Family Medicine

## 2022-03-18 ENCOUNTER — Ambulatory Visit: Payer: Medicare PPO | Admitting: Family Medicine

## 2022-03-18 VITALS — BP 120/70 | HR 70 | Temp 97.0°F | Ht 71.0 in | Wt 259.4 lb

## 2022-03-18 DIAGNOSIS — R55 Syncope and collapse: Secondary | ICD-10-CM

## 2022-03-18 DIAGNOSIS — I5023 Acute on chronic systolic (congestive) heart failure: Secondary | ICD-10-CM | POA: Diagnosis not present

## 2022-03-18 MED ORDER — FUROSEMIDE 20 MG PO TABS: 20.0000 mg | ORAL_TABLET | Freq: Two times a day (BID) | ORAL | 2 refills | Status: DC

## 2022-03-18 NOTE — Progress Notes (Signed)
[facility] [address] [place] [address] Dept: [phone] Dept Fax: [phone]  Office Visit  Subjective:    Patient ID: Shawn Blanchard, male    DOB: [birth year], 86 y.o..   MRN: [id number]  Chief Complaint  Patient presents with   Acute Visit    C/o having some dizziness while standing.  Had a couple falls (1-2 weeks) increased swelling in both legs/feet.  Losing balance.   BP 86-66 right before lunch.      History of Present Illness:  Patient is in today complaining of recurrent episodes of near syncope. He feels this has contributed to at least one fall he had in the past couple of months. These episodes most frequently occur with changes in position such as bending over or standing up. These are relieved by sitting down and lying down. He does admit to some dyspnea with exertion and occasionally at rest. He typically sleeps in a Sleep Number bed, with the head of the bed at about a 15-20 angle and at least 2 pillows. He has not had any PND. He has noted more swelling recently in his feet and ankles.  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/11/2022   Ulcer of left foot with fat layer exposed ([place])    Disorder of sesamoid bone of foot    Gout of left foot    Diabetic foot infection ([place]) 08/08/2021   Class 1 obesity 08/08/2021   Aortic atherosclerosis ([place]) 06/22/2021   SBO (small bowel obstruction) ([place]) 05/17/2021   Hypertensive urgency 05/17/2021   UTI (urinary tract infection) 05/17/2021   Normocytic anemia 05/17/2021   History of lymphoma 05/17/2021   Iron deficiency anemia 04/27/2021   History of squamous cell carcinoma in situ 04/18/2021   Statin intolerance 03/09/2021   Peritoneal adhesion 11/08/2020   Peripheral venous insufficiency 11/08/2020   Type 2 diabetes mellitus with stage 3a chronic kidney disease, without long-term  current use of insulin ([place]) 02/22/2020   Old MI (myocardial infarction)    Floppy eyelid syndrome of both eyes 10/05/2019   Sensorineural hearing loss (SNHL), bilateral 10/04/2019   Stage 3a chronic kidney disease ([place]) 06/13/2019   Carpal tunnel syndrome of right wrist 03/25/2019   Diabetic polyneuropathy associated with type 2 diabetes mellitus ([place]) 12/16/2018   Blepharitis of both upper and lower eyelid 10/02/2018   Meibomian gland dysfunction (MGD) of both eyes 10/02/2018   Essential hypertension 11/07/2017   Acute on chronic systolic CHF (congestive heart failure) ([place]) 11/07/2017   Orthostatic dizziness 09/15/2017   Dermatochalasis of both upper eyelids 11/21/2016   Chronic anticoagulation 10/11/2016   Male erectile disorder 06/17/2016   Hyperlipidemia 04/11/2016   Small bowel obstruction ([place]) 04/11/2016   BMI 35.0-35.9,adult 04/11/2016   Leukocytosis 04/11/2016   Arthritis 03/12/2016   Pseudophakia of both eyes 03/12/2016   Insomnia 11/27/2015   Ganglion cyst 11/27/2015   Chronic diastolic heart failure ([place]) 09/01/2015   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[place]) 08/30/2015   Obstructive sleep apnea 08/30/2015   Pulmonary hypertension ([place]) 08/30/2015   Restless leg syndrome 08/30/2015   Chronic atrial fibrillation ([place]) 06/12/2015   Gastroesophageal reflux disease without esophagitis 06/12/2015   Idiopathic chronic gout 05/17/2015   Coronary artery disease involving native coronary artery of native heart with angina pectoris ([place]) 02/15/2015   Hypertensive heart disease with heart failure ([place]) 02/15/2015  Type 2 diabetes mellitus with peripheral neuropathy ([place]) 12/02/2014   Past Surgical History:  Procedure Laterality Date   ABDOMINAL ADHESION SURGERY     x 2   BLEPHAROPLASTY Bilateral    CARPAL TUNNEL RELEASE Right 2021   CATARACT EXTRACTION Bilateral    CERVICAL SPINE SURGERY     CHOLECYSTECTOMY     CORONARY  ANGIOPLASTY WITH STENT PLACEMENT  2000   IRRIGATION AND DEBRIDEMENT FOOT Left 08/11/2021   Procedure: IRRIGATION AND DEBRIDEMENT FOOT, AND BONE BIOPSY, PARTIAL SESMOIDECTOMY;  Surgeon: Criselda Peaches, DPM;  Location: [facility];  Service: Podiatry;  Laterality: Left;   NASAL SINUS SURGERY     ORIF TIBIAL SHAFT FRACTURE W/ PLATES AND SCREWS Left    PARTIAL KNEE ARTHROPLASTY Bilateral    SMALL INTESTINE SURGERY     Adenocarcinoma   TONSILLECTOMY     Family History  Problem Relation Age of Onset   Cancer Mother        Ovarian   Diabetes Mother    Heart disease Mother    Heart disease Father    Stroke Father    Diabetes Father    Diabetes Sister    Diabetes Brother    Diabetes Brother    Cancer Paternal Grandfather        Prostate   Colon cancer Neg Hx    Rectal cancer Neg Hx    Pancreatic cancer Neg Hx    Liver cancer Neg Hx    Stomach cancer Neg Hx    Outpatient Medications Prior to Visit  Medication Sig Dispense Refill   allopurinol (ZYLOPRIM) 100 MG tablet Take 1 tablet (100 mg total) by mouth daily. 90 tablet 3   Alpha-Lipoic Acid 600 MG TABS Take 600 mg by mouth in the morning and at bedtime.     b complex vitamins capsule Take 1 capsule by mouth daily.     carvedilol (COREG) 3.125 MG tablet Take 1 tablet (3.125 mg total) by mouth 2 (two) times daily with a meal. 180 tablet 2   colchicine 0.6 MG tablet TAKE 2 TABLETS THEN 1 TABLET 1 HOUR LATER THEN 1 TABLET DAILY X 7 DAYS     Cyanocobalamin (B12 LIQUID HEALTH BOOSTER PO) Take 1 Drop/kg by mouth daily.     dabigatran (PRADAXA) 150 MG CAPS capsule TAKE 1 CAPSULE BY MOUTH 2 TIMES DAILY 180 capsule 2   FEROSUL 325 (65 Fe) MG tablet TAKE 1 TABLET BY MOUTH 2 TIMES A DAY 60 tablet 2   glucose blood test strip OneTouch Ultra Blue Test Strip     hydrocortisone 2.5 % ointment Apply 1 application topically 2 (two) times daily as needed (Skin Irritation).     JANUMET XR [phone] MG TB24 Take 1 tablet by mouth every morning.      lactobacillus acidophilus (BACID) TABS tablet Take 1 tablet by mouth 3 (three) times daily.     Lancets (ONETOUCH DELICA PLUS [id number]) MISC OneTouch Delica Plus Lancet 33 gauge     meclizine (ANTIVERT) 12.5 MG tablet Take 1 tablet (12.5 mg total) by mouth 3 (three) times daily as needed for dizziness. 30 tablet 0   Melatonin 5 MG CAPS Take 5 mg by mouth at bedtime.     metFORMIN (GLUCOPHAGE-XR) 500 MG 24 hr tablet Take 1 tablet (500 mg total) by mouth daily in the afternoon. 90 tablet 3   nitroGLYCERIN (NITROSTAT) 0.4 MG SL tablet PLACE 1 TABLET UNDER TONGUE EVERY 5 MINUTES AS NEEDED FOR CHEST PAIN (UP TO 3  DOSES) 25 tablet 11   ondansetron (ZOFRAN) 4 MG tablet Take 1 tablet (4 mg total) by mouth every 6 (six) hours as needed for nausea or vomiting. 20 tablet 0   OVER THE COUNTER MEDICATION Take 25 mg by mouth daily. Balance of Nature: Fruits and Vegs supplements     polyethylene glycol (MIRALAX) 17 g packet Take 17 g by mouth daily as needed. 14 each 0   polyvinyl alcohol (LIQUIFILM TEARS) 1.4 % ophthalmic solution Place 1 drop into both eyes as needed for dry eyes.     REPATHA SURECLICK 194 MG/ML SOAJ INJECT '140MG'$  INTO THE SKIN EVERY 14 DAYS 2 mL 6   Spacer/Aero-Holding Chambers DEVI Use with albuterol inhaler 1 each 0   furosemide (LASIX) 20 MG tablet TAKE 1 TABLET BY MOUTH 2 TIMES A DAY 180 tablet 2   albuterol (VENTOLIN HFA) 108 (90 Base) MCG/ACT inhaler Inhale 2 puffs into the lungs every 6 (six) hours as needed for wheezing or shortness of breath. 8 g 2   methylcellulose (CITRUCEL) oral powder Take 1 packet by mouth daily.     Turmeric 400 MG CAPS Take 400 mg by mouth daily.     No facility-administered medications prior to visit.   Allergies  Allergen Reactions   Ace Inhibitors Rash   Amoxicillin Nausea Only   Atorvastatin Other (See Comments)    Dizziness.   Jardiance [Empagliflozin] Other (See Comments)    Orthostasis   Meperidine Rash   Naproxen Sodium Rash   Sulfa  Antibiotics Nausea Only   Tetracycline Nausea Only     Objective:   Today's Vitals   03/18/22 1400  BP: 120/70  Pulse: 70  Temp: (!) 97 F (36.1 C)  TempSrc: Temporal  SpO2: 96%  Weight: 259 lb 6.4 oz (117.7 kg)  Height: '5\' 11"'$  (1.803 m)   Body mass index is 36.18 kg/m.   General: Well developed, well nourished. No acute distress. CV: RRR. There is a grade III/VI holosystolic murmur. Pulses 2+ bilaterally. Extremities:2-3+ lower leg edema noted. Psych: Alert and oriented. Normal mood and affect.  Health Maintenance Due  Topic Date Due   Medicare Annual Wellness (AWV)  03/07/2022     Assessment & Plan:   1. Near syncope 2. Acute on chronic systolic CHF (congestive heart failure) ([place]) I suspect a cardiac cause of the syncope. Shawn Blanchard has a known history of HFrEF. This is also likely the cause of his dyspnea. His weight is up 9 lbs. since Oct. I am concerned that he may be fluid overloaded. I will check a BMP to assess potassium and renal function. I will check his BNP to assess for acute heart failure. I will have him increase his Lasix to 40 mg each morning and continue 20 mg each evening. I will have him follow up with cardiology within the week.  - Basic metabolic panel - B Nat Peptide - furosemide (LASIX) 20 MG tablet; Take 1 tablet (20 mg total) by mouth 2 (two) times daily.  Dispense: 180 tablet; Refill: 2 - Ambulatory referral to Cardiology  Return if symptoms worsen or fail to improve.   Haydee Salter, MD

## 2022-03-19 ENCOUNTER — Ambulatory Visit (INDEPENDENT_AMBULATORY_CARE_PROVIDER_SITE_OTHER): Payer: Medicare PPO

## 2022-03-19 ENCOUNTER — Other Ambulatory Visit: Payer: Medicare PPO

## 2022-03-19 ENCOUNTER — Telehealth: Payer: Self-pay | Admitting: Family Medicine

## 2022-03-19 VITALS — Ht 71.0 in | Wt 255.0 lb

## 2022-03-19 DIAGNOSIS — Z0000 Encounter for general adult medical examination without abnormal findings: Secondary | ICD-10-CM

## 2022-03-19 LAB — BASIC METABOLIC PANEL
BUN: 25 mg/dL — ABNORMAL HIGH (ref 6–23)
CO2: 28 mEq/L (ref 19–32)
Calcium: 9.3 mg/dL (ref 8.4–10.5)
Chloride: 101 mEq/L (ref 96–112)
Creatinine, Ser: 1.47 mg/dL (ref 0.40–1.50)
GFR: 43.63 mL/min — ABNORMAL LOW (ref >60.00)
Glucose, Bld: 114 mg/dL — ABNORMAL HIGH (ref 70–99)
Potassium: 4.4 mEq/L (ref 3.5–5.1)
Sodium: 139 mEq/L (ref 135–145)

## 2022-03-19 LAB — BRAIN NATRIURETIC PEPTIDE: Pro B Natriuretic peptide (BNP): 609 pg/mL — ABNORMAL HIGH (ref 0.0–100.0)

## 2022-03-19 NOTE — Telephone Encounter (Signed)
Pt's wife, [name] is wondering what the directions should be for his furosemide (LASIX) 20 MG tablet [[id number]. She was under the impression the directions were going to be different. Please advise [name] at [phone]

## 2022-03-19 NOTE — Progress Notes (Signed)
I connected with Shawn Blanchard today by telephone and verified that I am speaking with the correct person using two identifiers. Location patient: home Location provider: work Persons participating in the virtual visit: Ilijah Doucet, [name] LPN.   I discussed the limitations, risks, security and privacy concerns of performing an evaluation and management service by telephone and the availability of in person appointments. I also discussed with the patient that there may be a patient responsible charge related to this service. The patient expressed understanding and verbally consented to this telephonic visit.    Interactive audio and video telecommunications were attempted between this provider and patient, however failed, due to patient having technical difficulties OR patient did not have access to video capability.  We continued and completed visit with audio only.     Vital signs may be patient reported or missing.  Subjective:   Shawn Blanchard is a 86 y.o. male who presents for Medicare Annual/Subsequent preventive examination.  Review of Systems     Cardiac Risk Factors include: advanced age (>19mn, >>21women);diabetes mellitus;dyslipidemia;hypertension;male gender;obesity (BMI >30kg/m2)     Objective:    Today's Vitals   03/19/22 0812  Weight: 255 lb (115.7 kg)  Height: _0  (1.803 m)   Body mass index is 35.57 kg/m.     03/19/2022    8:17 AM 08/08/2021    4:00 PM 08/08/2021    9:19 AM 05/16/2021    9:43 PM 03/07/2021    2:46 PM 12/15/2019    1:00 PM 12/14/2019    2:37 PM  Advanced Directives  Does Patient Have a Medical Advance Directive? Yes Yes Yes No Yes Yes Yes  Type of AParamedicof ADendronLiving will Living will   HFallisLiving will HGlendoraLiving will HTucson EstatesLiving will  Does patient want to make changes to medical advance directive?  No - Patient declined      No - Patient declined  Copy of H[place]in Chart? No - copy requested    No - copy requested No - copy requested No - copy requested  Would patient like information on creating a medical advance directive?    No - Patient declined       Current Medications (verified) Outpatient Encounter Medications as of 03/19/2022  Medication Sig   allopurinol (ZYLOPRIM) 100 MG tablet Take 1 tablet (100 mg total) by mouth daily.   Alpha-Lipoic Acid 600 MG TABS Take 600 mg by mouth in the morning and at bedtime.   b complex vitamins capsule Take 1 capsule by mouth daily.   carvedilol (COREG) 3.125 MG tablet Take 1 tablet (3.125 mg total) by mouth 2 (two) times daily with a meal.   colchicine 0.6 MG tablet TAKE 2 TABLETS THEN 1 TABLET 1 HOUR LATER THEN 1 TABLET DAILY X 7 DAYS   Cyanocobalamin (B12 LIQUID HEALTH BOOSTER PO) Take 1 Drop/kg by mouth daily.   dabigatran (PRADAXA) 150 MG CAPS capsule TAKE 1 CAPSULE BY MOUTH 2 TIMES DAILY   FEROSUL 325 (65 Fe) MG tablet TAKE 1 TABLET BY MOUTH 2 TIMES A DAY   furosemide (LASIX) 20 MG tablet Take 1 tablet (20 mg total) by mouth 2 (two) times daily.   glucose blood test strip OneTouch Ultra Blue Test Strip   hydrocortisone 2.5 % ointment Apply 1 application topically 2 (two) times daily as needed (Skin Irritation).   JANUMET XR [phone] MG TB24 Take 1 tablet by mouth  every morning.   lactobacillus acidophilus (BACID) TABS tablet Take 1 tablet by mouth 3 (three) times daily.   Lancets (ONETOUCH DELICA PLUS [id number]) MISC OneTouch Delica Plus Lancet 33 gauge   meclizine (ANTIVERT) 12.5 MG tablet Take 1 tablet (12.5 mg total) by mouth 3 (three) times daily as needed for dizziness.   Melatonin 5 MG CAPS Take 5 mg by mouth at bedtime.   metFORMIN (GLUCOPHAGE-XR) 500 MG 24 hr tablet Take 1 tablet (500 mg total) by mouth daily in the afternoon.   nitroGLYCERIN (NITROSTAT) 0.4 MG SL tablet PLACE 1 TABLET UNDER TONGUE EVERY 5 MINUTES AS NEEDED FOR  CHEST PAIN (UP TO 3 DOSES)   ondansetron (ZOFRAN) 4 MG tablet Take 1 tablet (4 mg total) by mouth every 6 (six) hours as needed for nausea or vomiting.   OVER THE COUNTER MEDICATION Take 25 mg by mouth daily. Balance of Nature: Fruits and Vegs supplements   polyethylene glycol (MIRALAX) 17 g packet Take 17 g by mouth daily as needed.   polyvinyl alcohol (LIQUIFILM TEARS) 1.4 % ophthalmic solution Place 1 drop into both eyes as needed for dry eyes.   REPATHA SURECLICK 195 MG/ML SOAJ INJECT 140MG INTO THE SKIN EVERY 14 DAYS   Spacer/Aero-Holding Dorise Bullion Use with albuterol inhaler   No facility-administered encounter medications on file as of 03/19/2022.    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   Aortic atherosclerosis ([place]) 06/22/2021   Arthritis 03/12/2016   Blepharitis of both upper and lower eyelid 10/02/2018   BMI 35.0-35.9,adult 04/11/2016   Carpal tunnel syndrome of right wrist 03/25/2019   Chronic anticoagulation 10/11/2016   Chronic atrial fibrillation ([place]) 06/12/2015   Overview:  Managed CARDS   Chronic diastolic heart failure ([place]) 09/01/2015   Class 1 obesity 08/08/2021   Coronary artery disease involving native coronary artery of native heart with angina pectoris ([place]) 02/15/2015   Overview:  Hx of MI with stent to LAD 2002 MPS 2016 with EF 47% and no ischemia   Dermatochalasis of both upper eyelids 11/21/2016   Diabetic foot infection ([place]) 08/08/2021   Diabetic polyneuropathy associated with type 2 diabetes mellitus ([place]) 12/16/2018   2020   Disorder of sesamoid bone of foot    Essential hypertension 11/07/2017   Floppy eyelid syndrome of both eyes 10/05/2019   Ganglion cyst  11/27/2015   Overview:  2017: dorsum right hand   Gastroesophageal reflux disease without esophagitis 06/12/2015   Gout of left foot    History of lymphoma 05/17/2021   History of squamous cell carcinoma in situ 04/18/2021   Hyperlipidemia 04/11/2016   Hypertensive heart disease with heart failure ([place]) 02/15/2015   Hypertensive urgency 05/17/2021   Idiopathic chronic gout 05/17/2015   Insomnia 11/27/2015   Iron deficiency anemia 04/27/2021   Leukocytosis 04/11/2016   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[place]) 08/30/2015   Male erectile disorder 06/17/2016   Meibomian gland dysfunction (MGD) of both eyes 10/02/2018   Normocytic anemia 05/17/2021   Obstructive sleep apnea 08/30/2015   Overview:  CPAP   Old MI (myocardial infarction)    Orthostatic dizziness 09/15/2017   2018: med related   Peripheral venous insufficiency 11/08/2020   Peritoneal adhesion 11/08/2020   Pseudophakia of both eyes 03/12/2016   Pulmonary hypertension ([place]) 08/30/2015  Overview:  Managed CARDS   Restless leg syndrome 08/30/2015   SBO (small bowel obstruction) ([place]) 12/14/2019   Sensorineural hearing loss (SNHL), bilateral 10/04/2019   Small bowel obstruction ([place]) 04/11/2016   Stage 3a chronic kidney disease ([place]) 06/13/2019   Statin intolerance 03/09/2021   Type 2 diabetes mellitus with neurologic complication, without long-term current use of insulin ([place]) 12/02/2014   Type 2 diabetes mellitus with stage 3a chronic kidney disease, without long-term current use of insulin ([place]) 02/22/2020   Ulcer of left foot with fat layer exposed ([place])    UTI (urinary tract infection) 05/17/2021   Past Surgical History:  Procedure Laterality Date   ABDOMINAL ADHESION SURGERY     x 2   BLEPHAROPLASTY Bilateral    CARPAL TUNNEL RELEASE Right 2021   CATARACT EXTRACTION Bilateral    CERVICAL SPINE SURGERY     CHOLECYSTECTOMY     CORONARY ANGIOPLASTY WITH STENT PLACEMENT  2000   IRRIGATION AND DEBRIDEMENT FOOT Left 08/11/2021   Procedure:  IRRIGATION AND DEBRIDEMENT FOOT, AND BONE BIOPSY, PARTIAL SESMOIDECTOMY;  Surgeon: Criselda Peaches, DPM;  Location: [facility];  Service: Podiatry;  Laterality: Left;   NASAL SINUS SURGERY     ORIF TIBIAL SHAFT FRACTURE W/ PLATES AND SCREWS Left    PARTIAL KNEE ARTHROPLASTY Bilateral    SMALL INTESTINE SURGERY     Adenocarcinoma   TONSILLECTOMY     Family History  Problem Relation Age of Onset   Cancer Mother        Ovarian   Diabetes Mother    Heart disease Mother    Heart disease Father    Stroke Father    Diabetes Father    Diabetes Sister    Diabetes Brother    Diabetes Brother    Cancer Paternal Grandfather        Prostate   Colon cancer Neg Hx    Rectal cancer Neg Hx    Pancreatic cancer Neg Hx    Liver cancer Neg Hx    Stomach cancer Neg Hx    Social History   Socioeconomic History   Marital status: Married    Spouse name: Not on file   Number of children: 2   Years of education: Not on file   Highest education level: Not on file  Occupational History   Not on file  Tobacco Use   Smoking status: Never   Smokeless tobacco: Never  Vaping Use   Vaping Use: Never used  Substance and Sexual Activity   Alcohol use: Not Currently   Drug use: Never   Sexual activity: Yes  Other Topics Concern   Not on file  Social History Narrative   Former high school principal.   Social Determinants of Health   Financial Resource Strain: Low Risk  (03/19/2022)   Overall Financial Resource Strain (CARDIA)    Difficulty of Paying Living Expenses: Not hard at all  Food Insecurity: No Food Insecurity (03/19/2022)   Hunger Vital Sign    Worried About Running Out of Food in the Last Year: Never true    Ran Out of Food in the Last Year: Never true  Transportation Needs: No Transportation Needs (03/19/2022)   PRAPARE - Hydrologist (Medical): No    Lack of Transportation (Non-Medical): No  Physical Activity: Inactive (03/19/2022)   Exercise Vital  Sign    Days of Exercise per Week: 0 days    Minutes of Exercise per Session: 0 min  Stress:  No Stress Concern Present (03/19/2022)   [place]    Feeling of Stress : Not at all  Social Connections: [place] (03/07/2021)   Social Connection and Isolation Panel [NHANES]    Frequency of Communication with Friends and Family: Twice a week    Frequency of Social Gatherings with Friends and Family: Twice a week    Attends Religious Services: More than 4 times per year    Active Member of [employer] or Organizations: Yes    Attends Music therapist: More than 4 times per year    Marital Status: Married    Tobacco Counseling Counseling given: Not Answered   Clinical Intake:  Pre-visit preparation completed: Yes  Pain : No/denies pain     Nutritional Status: BMI > 30  Obese Nutritional Risks: None Diabetes: Yes  How often do you need to have someone help you when you read instructions, pamphlets, or other written materials from your doctor or pharmacy?: 1 - Never  Diabetic? Yes Nutrition Risk Assessment:  Has the patient had any N/V/D within the last 2 months?  No  Does the patient have any non-healing wounds?  No  Has the patient had any unintentional weight loss or weight gain?  No   Diabetes:  Is the patient diabetic?  Yes  If diabetic, was a CBG obtained today?  No  Did the patient bring in their glucometer from home?  No  How often do you monitor your CBG's? rarely.   Financial Strains and Diabetes Management:  Are you having any financial strains with the device, your supplies or your medication? No .  Does the patient want to be seen by Chronic Care Management for management of their diabetes?  No  Would the patient like to be referred to a Nutritionist or for Diabetic Management?  No   Diabetic Exams:  Diabetic Eye Exam: Completed 04/15/2021 Diabetic Foot Exam: Completed  01/11/2022   Interpreter Needed?: No  Information entered by :: NAllen LPN   Activities of Daily Living    03/19/2022    8:20 AM 08/08/2021    4:00 PM  In your present state of health, do you have any difficulty performing the following activities:  Hearing? 1 0  Comment has hearing aids   Vision? 0 0  Difficulty concentrating or making decisions? 1 1  Walking or climbing stairs? 1 0  Dressing or bathing? 0 0  Doing errands, shopping? 0 0  Preparing Food and eating ? N   Using the Toilet? N   In the past six months, have you accidently leaked urine? N   Do you have problems with loss of bowel control? N   Managing your Medications? Y   Managing your Finances? N   Housekeeping or managing your Housekeeping? N     Patient Care Team: Haydee Salter, MD as PCP - General (Family Medicine) [name] Hilton Cork, MD as PCP - Cardiology (Cardiology) Richardo Priest, MD as Consulting Physician (Cardiology) [name], Lyman Bishop, MD as Consulting Physician (Nephrology) [name], MD as Consulting Physician (Dermatology) Tsamis, Constance Haw, MD as Referring Physician (Ophthalmology) Lorenda Peck, DPM as Consulting Physician (Podiatry)  Indicate any recent Medical Services you may have received from other than [facility] providers in the past year (date may be approximate).     Assessment:   This is a routine wellness examination for Ashkan.  Hearing/Vision screen Vision Screening - Comments:: Regular eye exams,   Dietary  issues and exercise activities discussed: Current Exercise Habits: The patient does not participate in regular exercise at present   Goals Addressed             This Visit's Progress    Patient Stated       03/19/2022, no goals       Depression Screen    03/19/2022    8:19 AM 09/24/2021    2:06 PM 07/24/2021    2:38 PM 03/29/2021   10:08 AM 03/07/2021    2:48 PM 03/07/2021    2:44 PM 11/08/2020    2:03 PM  PHQ 2/9 Scores  PHQ - 2 Score 0 0 0 0 0  0 0    Fall Risk    03/19/2022    8:18 AM 09/24/2021    2:07 PM 07/24/2021    2:37 PM 03/29/2021   10:08 AM 03/07/2021    2:47 PM  Fall Risk   Falls in the past year? _0 0 0  Comment fainting      Number falls in past yr: _1 0 0  Injury with Fall? 0 0 0  0  Risk for fall due to : Impaired balance/gait;Impaired mobility;Medication side effect      Follow up     Falls evaluation completed    FALL RISK PREVENTION PERTAINING TO THE HOME:  Any stairs in or around the home? Yes  If so, are there any without handrails? No  Home free of loose throw rugs in walkways, pet beds, electrical cords, etc? Yes  Adequate lighting in your home to reduce risk of falls? Yes   ASSISTIVE DEVICES UTILIZED TO PREVENT FALLS:  Life alert? No  Use of a cane, walker or w/c? Yes  Grab bars in the bathroom? Yes  Shower chair or bench in shower? Yes  Elevated toilet seat or a handicapped toilet? Yes   TIMED UP AND GO:  Was the test performed? No .      Cognitive Function:        03/19/2022    8:22 AM  6CIT Screen  What Year? 0 points  What month? 0 points  What time? 3 points  Count back from 20 0 points  Months in reverse 4 points  Repeat phrase 6 points  Total Score 13 points    Immunizations Immunization History  Administered Date(s) Administered   Fluad Quad(high Dose 65+) 12/08/2020, 01/11/2022   Influenza, High Dose Seasonal PF 01/25/2016, 01/13/2017, 01/01/2018, 12/29/2018, 01/08/2020   Influenza-Unspecified 01/25/2016, 01/13/2017   PFIZER Comirnaty(Gray Top)Covid-19 Tri-Sucrose Vaccine 08/01/2020   PFIZER(Purple Top)SARS-COV-2 Vaccination 04/24/2019, 05/15/2019, 01/04/2020, 01/18/2022   Pfizer Covid-19 Vaccine Bivalent Booster 83yr & up 02/06/2021   Pneumococcal Conjugate-13 10/13/2013, 01/15/2017   Pneumococcal Polysaccharide-23 05/20/2005   Td 12/07/2008, 06/22/2021   Zoster Recombinat (Shingrix) 01/04/2021, 03/16/2021   Zoster, Live 01/02/2012    TDAP status:  Up to date  Flu Vaccine status: Up to date  Pneumococcal vaccine status: Up to date  Covid-19 vaccine status: Completed vaccines  Qualifies for Shingles Vaccine? Yes   Zostavax completed Yes   Shingrix Completed?: Yes  Screening Tests Health Maintenance  Topic Date Due   Medicare Annual Wellness (AWV)  03/07/2022   COVID-19 Vaccine (7 - 2023-24 season) 04/03/2022 (Originally 03/15/2022)   OPHTHALMOLOGY EXAM  04/12/2022   HEMOGLOBIN A1C  07/13/2022   Diabetic kidney evaluation - Urine ACR  09/25/2022   Diabetic kidney evaluation - eGFR measurement  09/26/2022   FOOT EXAM  01/12/2023   DTaP/Tdap/Td (3 - Tdap) 06/23/2031   Pneumonia Vaccine 60+ Years old  Completed   INFLUENZA VACCINE  Completed   Zoster Vaccines- Shingrix  Completed   HPV VACCINES  Aged Out    Health Maintenance  Health Maintenance Due  Topic Date Due   Medicare Annual Wellness (AWV)  03/07/2022    Colorectal cancer screening: No longer required.   Lung Cancer Screening: (Low Dose CT Chest recommended if Age 60-80 years, 30 pack-year currently smoking OR have quit w/in 15years.) does not qualify.   Lung Cancer Screening Referral: no  Additional Screening:  Hepatitis C Screening: does not qualify;   Vision Screening: Recommended annual ophthalmology exams for early detection of glaucoma and other disorders of the eye. Is the patient up to date with their annual eye exam?  Yes  Who is the provider or what is the name of the office in which the patient attends annual eye exams? In [facility] If pt is not established with a provider, would they like to be referred to a provider to establish care? No .   Dental Screening: Recommended annual dental exams for proper oral hygiene  Community Resource Referral / Chronic Care Management: CRR required this visit?  No   CCM required this visit?  No      Plan:     I have personally reviewed and noted the following in the patient's chart:   Medical and  social history Use of alcohol, tobacco or illicit drugs  Current medications and supplements including opioid prescriptions. Patient is not currently taking opioid prescriptions. Functional ability and status Nutritional status Physical activity Advanced directives List of other physicians Hospitalizations, surgeries, and ER visits in previous 12 months Vitals Screenings to include cognitive, depression, and falls Referrals and appointments  In addition, I have reviewed and discussed with patient certain preventive protocols, quality metrics, and best practice recommendations. A written personalized care plan for preventive services as well as general preventive health recommendations were provided to patient.     Kellie Simmering, LPN   [id number]   Nurse Notes: none  Due to this being a virtual visit, the after visit summary with patients personalized plan was offered to patient via mail or my-chart. Patient would like to access on my-chart

## 2022-03-19 NOTE — Telephone Encounter (Signed)
Called and notified that provider gave them a verbal to take 2 tabs in am and then 1 at dinnertime till he sees cardiology.  No further questions  Dm/cma

## 2022-03-19 NOTE — Patient Instructions (Signed)
Shawn Blanchard , Thank you for taking time to come for your Medicare Wellness Visit. I appreciate your ongoing commitment to your health goals. Please review the following plan we discussed and let me know if I can assist you in the future.   These are the goals we discussed:  Goals      Patient Stated     03/19/2022, no goals        This is a list of the screening recommended for you and due dates:  Health Maintenance  Topic Date Due   COVID-19 Vaccine (7 - 2023-24 season) 04/03/2022*   Eye exam for diabetics  04/12/2022   Hemoglobin A1C  07/13/2022   Yearly kidney health urinalysis for diabetes  09/25/2022   Yearly kidney function blood test for diabetes  09/26/2022   Complete foot exam   01/12/2023   Medicare Annual Wellness Visit  03/20/2023   DTaP/Tdap/Td vaccine (3 - Tdap) 06/23/2031   Pneumonia Vaccine  Completed   Flu Shot  Completed   Zoster (Shingles) Vaccine  Completed   HPV Vaccine  Aged Out  *Topic was postponed. The date shown is not the original due date.    Advanced directives: Please bring a copy of your POA (Power of Attorney) and/or Living Will to your next appointment.   Conditions/risks identified: none  Next appointment: Follow up in one year for your annual wellness visit.   Preventive Care 5 Years and Older, Male  Preventive care refers to lifestyle choices and visits with your health care provider that can promote health and wellness. What does preventive care include? A yearly physical exam. This is also called an annual well check. Dental exams once or twice a year. Routine eye exams. Ask your health care provider how often you should have your eyes checked. Personal lifestyle choices, including: Daily care of your teeth and gums. Regular physical activity. Eating a healthy diet. Avoiding tobacco and drug use. Limiting alcohol use. Practicing safe sex. Taking low doses of aspirin every day. Taking vitamin and mineral supplements as  recommended by your health care provider. What happens during an annual well check? The services and screenings done by your health care provider during your annual well check will depend on your age, overall health, lifestyle risk factors, and family history of disease. Counseling  Your health care provider may ask you questions about your: Alcohol use. Tobacco use. Drug use. Emotional well-being. Home and relationship well-being. Sexual activity. Eating habits. History of falls. Memory and ability to understand (cognition). Work and work Statistician. Screening  You may have the following tests or measurements: Height, weight, and BMI. Blood pressure. Lipid and cholesterol levels. These may be checked every 5 years, or more frequently if you are over 59 years old. Skin check. Lung cancer screening. You may have this screening every year starting at age 72 if you have a 30-pack-year history of smoking and currently smoke or have quit within the past 15 years. Fecal occult blood test (FOBT) of the stool. You may have this test every year starting at age 64. Flexible sigmoidoscopy or colonoscopy. You may have a sigmoidoscopy every 5 years or a colonoscopy every 10 years starting at age 30. Prostate cancer screening. Recommendations will vary depending on your family history and other risks. Hepatitis C blood test. Hepatitis B blood test. Sexually transmitted disease (STD) testing. Diabetes screening. This is done by checking your blood sugar (glucose) after you have not eaten for a while (fasting). You may have  this done every 1-3 years. Abdominal aortic aneurysm (AAA) screening. You may need this if you are a current or former smoker. Osteoporosis. You may be screened starting at age 84 if you are at high risk. Talk with your health care provider about your test results, treatment options, and if necessary, the need for more tests. Vaccines  Your health care provider may recommend  certain vaccines, such as: Influenza vaccine. This is recommended every year. Tetanus, diphtheria, and acellular pertussis (Tdap, Td) vaccine. You may need a Td booster every 10 years. Zoster vaccine. You may need this after age 66. Pneumococcal 13-valent conjugate (PCV13) vaccine. One dose is recommended after age 25. Pneumococcal polysaccharide (PPSV23) vaccine. One dose is recommended after age 69. Talk to your health care provider about which screenings and vaccines you need and how often you need them. This information is not intended to replace advice given to you by your health care provider. Make sure you discuss any questions you have with your health care provider. Document Released: 04/21/2015 Document Revised: 12/13/2015 Document Reviewed: 01/24/2015 Elsevier Interactive Patient Education  2017 [place] Prevention in the Home Falls can cause injuries. They can happen to people of all ages. There are many things you can do to make your home safe and to help prevent falls. What can I do on the outside of my home? Regularly fix the edges of walkways and driveways and fix any cracks. Remove anything that might make you trip as you walk through a door, such as a raised step or threshold. Trim any bushes or trees on the path to your home. Use bright outdoor lighting. Clear any walking paths of anything that might make someone trip, such as rocks or tools. Regularly check to see if handrails are loose or broken. Make sure that both sides of any steps have handrails. Any raised decks and porches should have guardrails on the edges. Have any leaves, snow, or ice cleared regularly. Use sand or salt on walking paths during winter. Clean up any spills in your garage right away. This includes oil or grease spills. What can I do in the bathroom? Use night lights. Install grab bars by the toilet and in the tub and shower. Do not use towel bars as grab bars. Use non-skid mats or  decals in the tub or shower. If you need to sit down in the shower, use a plastic, non-slip stool. Keep the floor dry. Clean up any water that spills on the floor as soon as it happens. Remove soap buildup in the tub or shower regularly. Attach bath mats securely with double-sided non-slip rug tape. Do not have throw rugs and other things on the floor that can make you trip. What can I do in the bedroom? Use night lights. Make sure that you have a light by your bed that is easy to reach. Do not use any sheets or blankets that are too big for your bed. They should not hang down onto the floor. Have a firm chair that has side arms. You can use this for support while you get dressed. Do not have throw rugs and other things on the floor that can make you trip. What can I do in the kitchen? Clean up any spills right away. Avoid walking on wet floors. Keep items that you use a lot in easy-to-reach places. If you need to reach something above you, use a strong step stool that has a grab bar. Keep electrical cords out  of the way. Do not use floor polish or wax that makes floors slippery. If you must use wax, use non-skid floor wax. Do not have throw rugs and other things on the floor that can make you trip. What can I do with my stairs? Do not leave any items on the stairs. Make sure that there are handrails on both sides of the stairs and use them. Fix handrails that are broken or loose. Make sure that handrails are as long as the stairways. Check any carpeting to make sure that it is firmly attached to the stairs. Fix any carpet that is loose or worn. Avoid having throw rugs at the top or bottom of the stairs. If you do have throw rugs, attach them to the floor with carpet tape. Make sure that you have a light switch at the top of the stairs and the bottom of the stairs. If you do not have them, ask someone to add them for you. What else can I do to help prevent falls? Wear shoes that: Do not  have high heels. Have rubber bottoms. Are comfortable and fit you well. Are closed at the toe. Do not wear sandals. If you use a stepladder: Make sure that it is fully opened. Do not climb a closed stepladder. Make sure that both sides of the stepladder are locked into place. Ask someone to hold it for you, if possible. Clearly mark and make sure that you can see: Any grab bars or handrails. First and last steps. Where the edge of each step is. Use tools that help you move around (mobility aids) if they are needed. These include: Canes. Walkers. Scooters. Crutches. Turn on the lights when you go into a dark area. Replace any light bulbs as soon as they burn out. Set up your furniture so you have a clear path. Avoid moving your furniture around. If any of your floors are uneven, fix them. If there are any pets around you, be aware of where they are. Review your medicines with your doctor. Some medicines can make you feel dizzy. This can increase your chance of falling. Ask your doctor what other things that you can do to help prevent falls. This information is not intended to replace advice given to you by your health care provider. Make sure you discuss any questions you have with your health care provider. Document Released: 01/19/2009 Document Revised: 08/31/2015 Document Reviewed: 04/29/2014 Elsevier Interactive Patient Education  2017 [employer].

## 2022-03-19 NOTE — Telephone Encounter (Signed)
Left VM to rtn call. Dm/cma       

## 2022-03-28 ENCOUNTER — Encounter: Payer: Self-pay | Admitting: Cardiology

## 2022-03-28 ENCOUNTER — Ambulatory Visit: Payer: Medicare PPO | Attending: Cardiology | Admitting: Cardiology

## 2022-03-28 VITALS — BP 134/64 | HR 56 | Ht 71.0 in | Wt 251.4 lb

## 2022-03-28 DIAGNOSIS — Z7901 Long term (current) use of anticoagulants: Secondary | ICD-10-CM

## 2022-03-28 DIAGNOSIS — I482 Chronic atrial fibrillation, unspecified: Secondary | ICD-10-CM

## 2022-03-28 DIAGNOSIS — E785 Hyperlipidemia, unspecified: Secondary | ICD-10-CM

## 2022-03-28 DIAGNOSIS — N1831 Chronic kidney disease, stage 3a: Secondary | ICD-10-CM

## 2022-03-28 DIAGNOSIS — I951 Orthostatic hypotension: Secondary | ICD-10-CM

## 2022-03-28 DIAGNOSIS — Z789 Other specified health status: Secondary | ICD-10-CM

## 2022-03-28 DIAGNOSIS — I110 Hypertensive heart disease with heart failure: Secondary | ICD-10-CM | POA: Diagnosis not present

## 2022-03-28 DIAGNOSIS — I25119 Atherosclerotic heart disease of native coronary artery with unspecified angina pectoris: Secondary | ICD-10-CM

## 2022-03-28 MED ORDER — NITROGLYCERIN 0.4 MG SL SUBL: 0.4000 mg | SUBLINGUAL_TABLET | SUBLINGUAL | 11 refills | Status: DC | PRN

## 2022-03-28 NOTE — Progress Notes (Signed)
Cardiology Office Note:    Date:  03/28/2022   ID:  SHAWNDALE KILPATRICK, DOB November 25, 1935, MRN [id number]  PCP:  Haydee Salter, MD  Cardiologist:  Shirlee More, MD    Referring MD: Haydee Salter, MD    ASSESSMENT:    1. Coronary artery disease involving native coronary artery of native heart with angina pectoris ([place])   2. Hypertensive heart disease with heart failure ([place])   3. Orthostatic hypotension   4. Stage 3a chronic kidney disease ([name])   5. Chronic atrial fibrillation (HCC)   6. Chronic anticoagulation   7. Dyslipidemia   8. Statin intolerance    PLAN:    In order of problems listed above:  Multiple issues during this visit not all or cardiac they are quite concerned about his gait unsteadiness falls and neuropathy. CAD is stable having no angina continue medical treatment including beta-blocker lipid-lowering with nonstatin Repatha along with antithrombotic therapy with Pradaxa with coincident atrial fibrillation stable.  No change in treatment Heart failure is improved I told him he requires taking a dose of furosemide 40 mg daily and discussed with him techniques to lose weight sodium loading at home reducing commercially prepared foods and sodium restriction No recurrent symptomatic atrial fibrillation he is intolerant of SGLT2 inhibitor Stable CKD Rate controlled atrial fibrillation he will continue his current minimal dose beta-blocker along with anticoagulant Lipids are ideal we will continue Repatha avoid statins   Next appointment: 9 months   Medication Adjustments/Labs and Tests Ordered: Current medicines are reviewed at length with the patient today.  Concerns regarding medicines are outlined above.  No orders of the defined types were placed in this encounter.  Meds ordered this encounter  Medications   nitroGLYCERIN (NITROSTAT) 0.4 MG SL tablet    Sig: Place 1 tablet (0.4 mg total) under the tongue every 5 (five) minutes as needed for chest pain.     Dispense:  25 tablet    Refill:  11    Chief Complaint  Patient presents with   Follow-up   Congestive Heart Failure   Coronary Artery Disease   Hypotension    History of Present Illness:    Shawn Blanchard is a 86 y.o. male with a hx of CAD with PCI of the LAD in 2002 hypertensive heart disease of heart failure ejection fraction mildly reduced at 47% chronic atrial fibrillation with ant -coagulation previous symptomatic orthostatic hypotension with history of due to inhibitor hyperlipidemia and statin intolerance and mild aortic regurgitation last seen 08/28/2021.  Compliance with diet, lifestyle and medications: Yes  His daughter is present actively involved in his care and supervising as he has increasing difficulty living independently and has had falls. He has had more peripheral edema he had stopped Neurontin because of it and his dose of diuretic was increased she shows me a mid range NT proBNP level and I told her is consistent with the edema and increasing the diuretic as appropriate He is not having orthopnea shortness of breath chest pain palpitation or syncope He has had no recurrent orthostatic hypotension He tolerates his direct anticoagulant without bleeding complication Has had no muscle side effects from this we will plan for Past Medical History:  Diagnosis Date   Acute on chronic systolic CHF (congestive heart failure) ([place]) 11/07/2017   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   Aortic atherosclerosis ([place]) 06/22/2021   Arthritis 03/12/2016   Blepharitis of both upper and lower eyelid 10/02/2018   BMI 35.0-35.9,adult 04/11/2016  Carpal tunnel syndrome of right wrist 03/25/2019   Chronic anticoagulation 10/11/2016   Chronic atrial fibrillation ([place]) 06/12/2015   Overview:  Managed CARDS   Chronic diastolic heart failure ([place]) 09/01/2015   Class 1 obesity 08/08/2021   Coronary artery disease involving native coronary artery of native heart with angina  pectoris ([place]) 02/15/2015   Overview:  Hx of MI with stent to LAD 2002 MPS 2016 with EF 47% and no ischemia   Dermatochalasis of both upper eyelids 11/21/2016   Diabetic foot infection ([place]) 08/08/2021   Diabetic polyneuropathy associated with type 2 diabetes mellitus ([place]) 12/16/2018   2020   Disorder of sesamoid bone of foot    Essential hypertension 11/07/2017   Floppy eyelid syndrome of both eyes 10/05/2019   Ganglion cyst 11/27/2015   Overview:  2017: dorsum right hand   Gastroesophageal reflux disease without esophagitis 06/12/2015   Gout of left foot    History of lymphoma 05/17/2021   History of squamous cell carcinoma in situ 04/18/2021   Hyperlipidemia 04/11/2016   Hypertensive heart disease with heart failure ([place]) 02/15/2015   Hypertensive urgency 05/17/2021   Idiopathic chronic gout 05/17/2015   Insomnia 11/27/2015   Iron deficiency anemia 04/27/2021   Leukocytosis 04/11/2016   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[place]) 08/30/2015   Male erectile disorder 06/17/2016   Meibomian gland dysfunction (MGD) of both eyes 10/02/2018   Normocytic anemia 05/17/2021   Obstructive sleep apnea 08/30/2015   Overview:  CPAP   Old MI (myocardial infarction)    Orthostatic dizziness 09/15/2017   2018: med related   Peripheral venous insufficiency 11/08/2020   Peritoneal adhesion 11/08/2020   Pseudophakia of both eyes 03/12/2016   Pulmonary hypertension ([place]) 08/30/2015   Overview:  Managed CARDS   Restless leg syndrome 08/30/2015   SBO (small bowel obstruction) ([place]) 12/14/2019   Sensorineural hearing loss (SNHL), bilateral 10/04/2019   Small bowel obstruction ([place]) 04/11/2016   Stage 3a chronic kidney disease ([place]) 06/13/2019   Statin intolerance 03/09/2021   Type 2 diabetes mellitus with neurologic complication, without long-term current use of insulin ([place]) 12/02/2014   Type 2 diabetes mellitus with stage 3a chronic kidney disease, without long-term current use of insulin ([place]) 02/22/2020   Ulcer of  left foot with fat layer exposed ([place])    UTI (urinary tract infection) 05/17/2021    Past Surgical History:  Procedure Laterality Date   ABDOMINAL ADHESION SURGERY     x 2   BLEPHAROPLASTY Bilateral    CARPAL TUNNEL RELEASE Right 2021   CATARACT EXTRACTION Bilateral    CERVICAL SPINE SURGERY     CHOLECYSTECTOMY     CORONARY ANGIOPLASTY WITH STENT PLACEMENT  2000   IRRIGATION AND DEBRIDEMENT FOOT Left 08/11/2021   Procedure: IRRIGATION AND DEBRIDEMENT FOOT, AND BONE BIOPSY, PARTIAL SESMOIDECTOMY;  Surgeon: Criselda Peaches, DPM;  Location: [facility];  Service: Podiatry;  Laterality: Left;   NASAL SINUS SURGERY     ORIF TIBIAL SHAFT FRACTURE W/ PLATES AND SCREWS Left    PARTIAL KNEE ARTHROPLASTY Bilateral    SMALL INTESTINE SURGERY     Adenocarcinoma   TONSILLECTOMY      Current Medications: Current Meds  Medication Sig   allopurinol (ZYLOPRIM) 100 MG tablet Take 1 tablet (100 mg total) by mouth daily.   Alpha-Lipoic Acid 600 MG TABS Take 600 mg by mouth in the morning and at bedtime.   b complex vitamins capsule Take 1 capsule by mouth daily.   carvedilol (COREG) 3.125 MG tablet  Take 1 tablet (3.125 mg total) by mouth 2 (two) times daily with a meal.   colchicine 0.6 MG tablet TAKE 2 TABLETS THEN 1 TABLET 1 HOUR LATER THEN 1 TABLET DAILY X 7 DAYS   Cyanocobalamin (B12 LIQUID HEALTH BOOSTER PO) Take 1 Drop/kg by mouth daily.   dabigatran (PRADAXA) 150 MG CAPS capsule TAKE 1 CAPSULE BY MOUTH 2 TIMES DAILY   FEROSUL 325 (65 Fe) MG tablet TAKE 1 TABLET BY MOUTH 2 TIMES A DAY   furosemide (LASIX) 20 MG tablet Take 1 tablet (20 mg total) by mouth 2 (two) times daily. (Patient taking differently: Take 40 mg by mouth daily with breakfast. And takes 20 mg at lunch)   glucose blood test strip OneTouch Ultra Blue Test Strip   hydrocortisone 2.5 % ointment Apply 1 application topically 2 (two) times daily as needed (Skin Irritation).   JANUMET XR 671 820 4099 MG TB24 Take 1 tablet by mouth every  morning.   lactobacillus acidophilus (BACID) TABS tablet Take 1 tablet by mouth 3 (three) times daily.   Lancets (ONETOUCH DELICA PLUS [id number]) MISC OneTouch Delica Plus Lancet 33 gauge   meclizine (ANTIVERT) 12.5 MG tablet Take 1 tablet (12.5 mg total) by mouth 3 (three) times daily as needed for dizziness.   Melatonin 5 MG CAPS Take 5 mg by mouth at bedtime.   metFORMIN (GLUCOPHAGE-XR) 500 MG 24 hr tablet Take 1 tablet (500 mg total) by mouth daily in the afternoon.   ondansetron (ZOFRAN) 4 MG tablet Take 1 tablet (4 mg total) by mouth every 6 (six) hours as needed for nausea or vomiting.   OVER THE COUNTER MEDICATION Take 25 mg by mouth daily. Balance of Nature: Fruits and Vegs supplements   polyethylene glycol (MIRALAX) 17 g packet Take 17 g by mouth daily as needed.   polyvinyl alcohol (LIQUIFILM TEARS) 1.4 % ophthalmic solution Place 1 drop into both eyes as needed for dry eyes.   REPATHA SURECLICK 585 MG/ML SOAJ INJECT '140MG'$  INTO THE SKIN EVERY 14 DAYS   Spacer/Aero-Holding Chambers DEVI Use with albuterol inhaler   [DISCONTINUED] nitroGLYCERIN (NITROSTAT) 0.4 MG SL tablet PLACE 1 TABLET UNDER TONGUE EVERY 5 MINUTES AS NEEDED FOR CHEST PAIN (UP TO 3 DOSES)     Allergies:   Ace inhibitors, Amoxicillin, Atorvastatin, Jardiance [empagliflozin], Meperidine, Naproxen sodium, Sulfa antibiotics, and Tetracycline   Social History   Socioeconomic History   Marital status: Married    Spouse name: Not on file   Number of children: 2   Years of education: Not on file   Highest education level: Not on file  Occupational History   Not on file  Tobacco Use   Smoking status: Never   Smokeless tobacco: Never  Vaping Use   Vaping Use: Never used  Substance and Sexual Activity   Alcohol use: Not Currently   Drug use: Never   Sexual activity: Yes  Other Topics Concern   Not on file  Social History Narrative   Former high school principal.   Social Determinants of Health   Financial  Resource Strain: Low Risk  (03/19/2022)   Overall Financial Resource Strain (CARDIA)    Difficulty of Paying Living Expenses: Not hard at all  Food Insecurity: No Food Insecurity (03/19/2022)   Hunger Vital Sign    Worried About Running Out of Food in the Last Year: Never true    Ran Out of Food in the Last Year: Never true  Transportation Needs: No Transportation Needs (03/19/2022)   PRAPARE -  Hydrologist (Medical): No    Lack of Transportation (Non-Medical): No  Physical Activity: Inactive (03/19/2022)   Exercise Vital Sign    Days of Exercise per Week: 0 days    Minutes of Exercise per Session: 0 min  Stress: No Stress Concern Present (03/19/2022)   [place]    Feeling of Stress : Not at all  Social Connections: [place] (03/07/2021)   Social Connection and Isolation Panel [NHANES]    Frequency of Communication with Friends and Family: Twice a week    Frequency of Social Gatherings with Friends and Family: Twice a week    Attends Religious Services: More than 4 times per year    Active Member of [employer] or Organizations: Yes    Attends Music therapist: More than 4 times per year    Marital Status: Married     Family History: The patient's family history includes Cancer in his mother and paternal grandfather; Diabetes in his brother, brother, father, mother, and sister; Heart disease in his father and mother; Stroke in his father. There is no history of Colon cancer, Rectal cancer, Pancreatic cancer, Liver cancer, or Stomach cancer. ROS:   Please see the history of present illness.    All other systems reviewed and are negative.  EKGs/Labs/Other Studies Reviewed:    The following studies were reviewed today: .Echocardiogram 10/15/2019 showed ejection fraction 45 to 50% mild concentric LVH mild right ventricular dysfunction moderate mitral annular calcification  with trivial regurgitation mild aortic regurgitation without findings of aortic stenosis. He was last seen 01/01/2021   Recent Labs: 03/29/2021: TSH 1.51 08/08/2021: Magnesium 2.2 08/11/2021: B Natriuretic Peptide 459.5 09/25/2021: ALT 13; Hemoglobin 11.4; Platelets 214.0 03/18/2022: BUN 25; Creatinine, Ser 1.47; Potassium 4.4; Pro B Natriuretic peptide (BNP) 609.0; Sodium 139  Recent Lipid Panel    Component Value Date/Time   CHOL 63 06/22/2021 0920   TRIG 79.0 06/22/2021 0920   HDL 34.10 (L) 06/22/2021 0920   CHOLHDL 2 06/22/2021 0920   VLDL 15.8 06/22/2021 0920   LDLCALC 13 06/22/2021 0920    Physical Exam:    VS:  BP 134/64   Pulse (!) 56   Ht '5\' 11"'$  (1.803 m)   Wt 251 lb 6.4 oz (114 kg)   SpO2 99%   BMI 35.06 kg/m     Wt Readings from Last 3 Encounters:  03/28/22 251 lb 6.4 oz (114 kg)  03/19/22 255 lb (115.7 kg)  03/18/22 259 lb 6.4 oz (117.7 kg)     GEN: He is starting to appear frail well nourished, well developed in no acute distress HEENT: Normal NECK: No JVD; No carotid bruits LYMPHATICS: No lymphadenopathy CARDIAC: RRR, no murmurs, rubs, gallops RESPIRATORY:  Clear to auscultation without rales, wheezing or rhonchi  ABDOMEN: Soft, non-tender, non-distended MUSCULOSKELETAL: 1+ bilaterally lower extremity edema edema; No deformity  SKIN: Warm and dry NEUROLOGIC:  Alert and oriented x 3 PSYCHIATRIC:  Normal affect    Signed, Shirlee More, MD  03/28/2022 12:12 PM    [place]

## 2022-03-28 NOTE — Patient Instructions (Signed)
Medication Instructions:  Your physician recommends that you continue on your current medications as directed. Please refer to the Current Medication list given to you today.  *If you need a refill on your cardiac medications before your next appointment, please call your pharmacy*   Lab Work: NONE If you have labs (blood work) drawn today and your tests are completely normal, you will receive your results only by: [place] (if you have MyChart) OR A paper copy in the mail If you have any lab test that is abnormal or we need to change your treatment, we will call you to review the results.   Testing/Procedures: NONE   Follow-Up: At [facility], you and your health needs are our priority.  As part of our continuing mission to provide you with exceptional heart care, we have created designated Provider Care Teams.  These Care Teams include your primary Cardiologist (physician) and Advanced Practice Providers (APPs -  Physician Assistants and Nurse Practitioners) who all work together to provide you with the care you need, when you need it.  We recommend signing up for the patient portal called "MyChart".  Sign up information is provided on this After Visit Summary.  MyChart is used to connect with patients for Virtual Visits (Telemedicine).  Patients are able to view lab/test results, encounter notes, upcoming appointments, etc.  Non-urgent messages can be sent to your provider as well.   To learn more about what you can do with MyChart, go to [email or web address].    Your next appointment:   9 month(s)  The format for your next appointment:   In Person  Provider:   Shirlee More, MD    Other Instructions Activate I Phone  Important Information About Sugar

## 2022-04-05 ENCOUNTER — Other Ambulatory Visit: Payer: Self-pay | Admitting: Family Medicine

## 2022-04-05 DIAGNOSIS — D509 Iron deficiency anemia, unspecified: Secondary | ICD-10-CM

## 2022-04-09 NOTE — Telephone Encounter (Signed)
Patient scheduled for 04/26/22. Dm/cma

## 2022-04-10 DIAGNOSIS — G4733 Obstructive sleep apnea (adult) (pediatric): Secondary | ICD-10-CM | POA: Diagnosis not present

## 2022-04-10 DIAGNOSIS — R0689 Other abnormalities of breathing: Secondary | ICD-10-CM | POA: Diagnosis not present

## 2022-04-11 ENCOUNTER — Other Ambulatory Visit: Payer: Self-pay | Admitting: Cardiology

## 2022-04-26 ENCOUNTER — Encounter: Payer: Self-pay | Admitting: Family Medicine

## 2022-04-26 ENCOUNTER — Ambulatory Visit: Payer: Medicare PPO | Admitting: Family Medicine

## 2022-04-26 VITALS — BP 126/64 | HR 56 | Temp 97.7°F | Ht 71.0 in | Wt 251.4 lb

## 2022-04-26 DIAGNOSIS — I482 Chronic atrial fibrillation, unspecified: Secondary | ICD-10-CM

## 2022-04-26 DIAGNOSIS — I10 Essential (primary) hypertension: Secondary | ICD-10-CM

## 2022-04-26 DIAGNOSIS — I5032 Chronic diastolic (congestive) heart failure: Secondary | ICD-10-CM | POA: Diagnosis not present

## 2022-04-26 DIAGNOSIS — E1142 Type 2 diabetes mellitus with diabetic polyneuropathy: Secondary | ICD-10-CM | POA: Diagnosis not present

## 2022-04-26 DIAGNOSIS — I5023 Acute on chronic systolic (congestive) heart failure: Secondary | ICD-10-CM

## 2022-04-26 DIAGNOSIS — E782 Mixed hyperlipidemia: Secondary | ICD-10-CM

## 2022-04-26 LAB — HEMOGLOBIN A1C: Hgb A1c MFr Bld: 6 % (ref 4.6–6.5)

## 2022-04-26 MED ORDER — FUROSEMIDE 20 MG PO TABS: ORAL_TABLET | ORAL | 3 refills | Status: DC

## 2022-04-26 NOTE — Progress Notes (Signed)
[facility] [address] [place] [address] Dept: [phone] Dept Fax: [phone]  Chronic Care Office Visit  Subjective:    Patient ID: Shawn Blanchard, male    DOB: 02-03-1936, 87 y.o..   MRN: [id number]  Chief Complaint  Patient presents with   Follow-up    3 month f/u.  No concerns    History of Present Illness:  Patient is in today for reassessment of chronic medical issues.   Shawn Blanchard has an extensive history of CV disease. He had a prior heart attack and is s/p PTCA with stent placement. 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 He is currently managed on carvedilol 3.125 mg bid and Lasix. At his last visit, we increased the lasix to 40 mg q am and 20 mg q noon due to increased lower leg edema. He feels this is making a difference. He is also on dabigatran (Pradaxa) 150 mg bid for anticoagulation. He has hyperlipidemia, but is intolerant of statins, so is managed on Repatha. Shawn Blanchard does have some orthostasis symptoms at times.   Shawn Blanchard has a history of Type 2 diabetes. He is managed on sitagliptin/metformin (Janumet XR) [phone] mg daily and metformin 500 mg q pm. His diabetes is complicated with peripheral neuropathy and chronic kidney disease.   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[place]) 01/11/2022   Ulcer of left foot with fat layer exposed ([name])    Disorder of sesamoid bone of foot    Gout of left foot    Diabetic foot infection ([name]) 08/08/2021   Class 1 obesity 08/08/2021   Aortic atherosclerosis ([name]) 06/22/2021   SBO (small bowel obstruction) ([name]) 05/17/2021   Hypertensive urgency 05/17/2021   UTI (urinary tract infection) 05/17/2021   Normocytic anemia 05/17/2021   History of lymphoma 05/17/2021   Iron deficiency anemia 04/27/2021   History of squamous cell carcinoma  in situ 04/18/2021   Statin intolerance 03/09/2021   Peritoneal adhesion 11/08/2020   Peripheral venous insufficiency 11/08/2020   Type 2 diabetes mellitus with stage 3a chronic kidney disease, without long-term current use of insulin ([name]) 02/22/2020   Old MI (myocardial infarction)    Floppy eyelid syndrome of both eyes 10/05/2019   Sensorineural hearing loss (SNHL), bilateral 10/04/2019   Stage 3a chronic kidney disease ([name]) 06/13/2019   Carpal tunnel syndrome of right wrist 03/25/2019   Diabetic polyneuropathy associated with type 2 diabetes mellitus ([place]) 12/16/2018   Blepharitis of both upper and lower eyelid 10/02/2018   Meibomian gland dysfunction (MGD) of both eyes 10/02/2018   Essential hypertension 11/07/2017   Acute on chronic systolic CHF (congestive heart failure) ([name]) 11/07/2017   Orthostatic dizziness 09/15/2017   Dermatochalasis of both upper eyelids 11/21/2016   Chronic anticoagulation 10/11/2016   Male erectile disorder 06/17/2016   Hyperlipidemia 04/11/2016   Small bowel obstruction ([place]) 04/11/2016   BMI 35.0-35.9,adult 04/11/2016   Leukocytosis 04/11/2016   Arthritis 03/12/2016   Pseudophakia of both eyes 03/12/2016   Insomnia 11/27/2015   Ganglion cyst 11/27/2015   Chronic diastolic heart failure ([name]) 09/01/2015   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[name]) 08/30/2015   Obstructive sleep apnea 08/30/2015   Pulmonary hypertension ([name]) 08/30/2015   Restless leg syndrome 08/30/2015   Chronic atrial fibrillation ([name]) 06/12/2015   Gastroesophageal reflux disease without esophagitis 06/12/2015   Idiopathic  chronic gout 05/17/2015   Coronary artery disease involving native coronary artery of native heart with angina pectoris ([name]) 02/15/2015   Hypertensive heart disease with heart failure ([name]) 02/15/2015   Type 2 diabetes mellitus with peripheral neuropathy ([name]) 12/02/2014   Past Surgical History:  Procedure  Laterality Date   ABDOMINAL ADHESION SURGERY     x 2   BLEPHAROPLASTY Bilateral    CARPAL TUNNEL RELEASE Right 2021   CATARACT EXTRACTION Bilateral    CERVICAL SPINE SURGERY     CHOLECYSTECTOMY     CORONARY ANGIOPLASTY WITH STENT PLACEMENT  2000   IRRIGATION AND DEBRIDEMENT FOOT Left 08/11/2021   Procedure: IRRIGATION AND DEBRIDEMENT FOOT, AND BONE BIOPSY, PARTIAL SESMOIDECTOMY;  Surgeon: Criselda Peaches, DPM;  Location: [facility];  Service: Podiatry;  Laterality: Left;   NASAL SINUS SURGERY     ORIF TIBIAL SHAFT FRACTURE W/ PLATES AND SCREWS Left    PARTIAL KNEE ARTHROPLASTY Bilateral    SMALL INTESTINE SURGERY     Adenocarcinoma   TONSILLECTOMY     Family History  Problem Relation Age of Onset   Cancer Mother        Ovarian   Diabetes Mother    Heart disease Mother    Heart disease Father    Stroke Father    Diabetes Father    Diabetes Sister    Diabetes Brother    Diabetes Brother    Cancer Paternal Grandfather        Prostate   Colon cancer Neg Hx    Rectal cancer Neg Hx    Pancreatic cancer Neg Hx    Liver cancer Neg Hx    Stomach cancer Neg Hx    Outpatient Medications Prior to Visit  Medication Sig Dispense Refill   allopurinol (ZYLOPRIM) 100 MG tablet Take 1 tablet (100 mg total) by mouth daily. 90 tablet 3   Alpha-Lipoic Acid 600 MG TABS Take 600 mg by mouth in the morning and at bedtime.     b complex vitamins capsule Take 1 capsule by mouth daily.     carvedilol (COREG) 3.125 MG tablet Take 1 tablet (3.125 mg total) by mouth 2 (two) times daily with a meal. 180 tablet 3   Cyanocobalamin (B12 LIQUID HEALTH BOOSTER PO) Take 1 Drop/kg by mouth daily.     dabigatran (PRADAXA) 150 MG CAPS capsule TAKE 1 CAPSULE BY MOUTH 2 TIMES DAILY 180 capsule 2   ferrous sulfate (FEROSUL) 325 (65 FE) MG tablet TAKE 1 TABLET BY MOUTH 2 TIMES A DAY 60 tablet 0   glucose blood test strip OneTouch Ultra Blue Test Strip     hydrocortisone 2.5 % ointment Apply 1 application topically 2  (two) times daily as needed (Skin Irritation).     lactobacillus acidophilus (BACID) TABS tablet Take 1 tablet by mouth 3 (three) times daily.     Lancets (ONETOUCH DELICA PLUS [id number]) MISC OneTouch Delica Plus Lancet 33 gauge     Melatonin 5 MG CAPS Take 5 mg by mouth at bedtime.     metFORMIN (GLUCOPHAGE-XR) 500 MG 24 hr tablet Take 1 tablet (500 mg total) by mouth daily in the afternoon. 90 tablet 3   OVER THE COUNTER MEDICATION Take 25 mg by mouth daily. Balance of Nature: Fruits and Vegs supplements     polyethylene glycol (MIRALAX) 17 g packet Take 17 g by mouth daily as needed. 14 each 0   polyvinyl alcohol (LIQUIFILM TEARS) 1.4 % ophthalmic solution Place 1 drop into both eyes  as needed for dry eyes.     REPATHA SURECLICK 681 MG/ML SOAJ INJECT '140MG'$  INTO THE SKIN EVERY 14 DAYS 2 mL 6   Spacer/Aero-Holding Chambers DEVI Use with albuterol inhaler 1 each 0   furosemide (LASIX) 20 MG tablet Take 1 tablet (20 mg total) by mouth 2 (two) times daily. (Patient taking differently: Take 40 mg by mouth daily with breakfast. And takes 20 mg at lunch) 180 tablet 2   JANUMET XR [phone] MG TB24 Take 1 tablet by mouth every morning. (Patient not taking: Reported on 04/26/2022)     colchicine 0.6 MG tablet TAKE 2 TABLETS THEN 1 TABLET 1 HOUR LATER THEN 1 TABLET DAILY X 7 DAYS (Patient not taking: Reported on 04/26/2022)     meclizine (ANTIVERT) 12.5 MG tablet Take 1 tablet (12.5 mg total) by mouth 3 (three) times daily as needed for dizziness. (Patient not taking: Reported on 04/26/2022) 30 tablet 0   nitroGLYCERIN (NITROSTAT) 0.4 MG SL tablet Place 1 tablet (0.4 mg total) under the tongue every 5 (five) minutes as needed for chest pain. (Patient not taking: Reported on 04/26/2022) 25 tablet 11   ondansetron (ZOFRAN) 4 MG tablet Take 1 tablet (4 mg total) by mouth every 6 (six) hours as needed for nausea or vomiting. (Patient not taking: Reported on 04/26/2022) 20 tablet 0   No facility-administered  medications prior to visit.   Allergies  Allergen Reactions   Ace Inhibitors Rash   Amoxicillin Nausea Only   Atorvastatin Other (See Comments)    Dizziness.   Jardiance [Empagliflozin] Other (See Comments)    Orthostasis   Meperidine Rash   Naproxen Sodium Rash   Sulfa Antibiotics Nausea Only   Tetracycline Nausea Only     Objective:   Today's Vitals   04/26/22 0810  BP: 126/64  Pulse: (!) 56  Temp: 97.7 F (36.5 C)  TempSrc: Tympanic  SpO2: 97%  Weight: 251 lb 6.4 oz (114 kg)  Height: '5\' 11"'$  (1.803 m)   Body mass index is 35.06 kg/m.   General: Well developed, well nourished. No acute distress. Lungs: Clear to auscultation bilaterally. No wheezing, rales or rhonchi. CV: IRRR with a II/VI systolic murmur. Pulses 2+ bilaterally. Extremities: 1-2+ lower leg edema. Psych: Alert and oriented. Normal mood and affect.  Health Maintenance Due  Topic Date Due   OPHTHALMOLOGY EXAM  04/12/2022     Assessment & Plan:   1. Type 2 diabetes mellitus with peripheral neuropathy (HCC) Last A1c was at goal. We will check the A1c today. Continue Janumet XR 01-999 mg q am and metformin 500 mg q pm.   - Hemoglobin L5B - Basic metabolic panel  2. Chronic diastolic heart failure (HCC) Compensated. Weight is down 8 lbs form Dec. 11th. Continue carvedilol 3.125 mg bid and Lasix 40 mg q am and 20 mg q noon. I will reassess potassium and renal function today.   - furosemide (LASIX) 20 MG tablet; Take 2 tablets (40 mg total) by mouth daily with breakfast AND 1 tablet (20 mg total) daily before lunch.  Dispense: 270 tablet; Refill: 3  5. Chronic atrial fibrillation (HCC) Stable. Continue dabigatran 150 mg bid.  6. Mixed hyperlipidemia Continue Repatha.   Return in about 3 months (around 07/26/2022) for Reassessment.   Haydee Salter, MD

## 2022-05-01 ENCOUNTER — Other Ambulatory Visit (INDEPENDENT_AMBULATORY_CARE_PROVIDER_SITE_OTHER): Payer: Medicare PPO

## 2022-05-01 DIAGNOSIS — E1142 Type 2 diabetes mellitus with diabetic polyneuropathy: Secondary | ICD-10-CM | POA: Diagnosis not present

## 2022-05-01 LAB — BASIC METABOLIC PANEL
BUN: 27 mg/dL — ABNORMAL HIGH (ref 6–23)
CO2: 27 mEq/L (ref 19–32)
Calcium: 9.4 mg/dL (ref 8.4–10.5)
Chloride: 103 mEq/L (ref 96–112)
Creatinine, Ser: 1.42 mg/dL (ref 0.40–1.50)
GFR: 45.45 mL/min — ABNORMAL LOW (ref >60.00)
Glucose, Bld: 112 mg/dL — ABNORMAL HIGH (ref 70–99)
Potassium: 4.5 mEq/L (ref 3.5–5.1)
Sodium: 143 mEq/L (ref 135–145)

## 2022-05-01 NOTE — Addendum Note (Signed)
Addended by: Beryle Lathe S on: 05/01/2022 09:02 AM   Modules accepted: Orders

## 2022-05-02 ENCOUNTER — Ambulatory Visit: Payer: Medicare PPO | Admitting: Podiatry

## 2022-05-02 ENCOUNTER — Other Ambulatory Visit: Payer: Self-pay | Admitting: Family Medicine

## 2022-05-02 DIAGNOSIS — M79675 Pain in left toe(s): Secondary | ICD-10-CM

## 2022-05-02 DIAGNOSIS — E1142 Type 2 diabetes mellitus with diabetic polyneuropathy: Secondary | ICD-10-CM

## 2022-05-02 DIAGNOSIS — M79674 Pain in right toe(s): Secondary | ICD-10-CM | POA: Diagnosis not present

## 2022-05-02 DIAGNOSIS — B351 Tinea unguium: Secondary | ICD-10-CM | POA: Diagnosis not present

## 2022-05-02 DIAGNOSIS — D509 Iron deficiency anemia, unspecified: Secondary | ICD-10-CM

## 2022-05-02 NOTE — Progress Notes (Signed)
  Subjective:  Patient ID: Shawn Blanchard, male    DOB: 1935/09/23,  MRN: [id number]  Chief Complaint  Patient presents with   Nail Problem    Thick painful toenails, 3 month follow up    87 y.o. male presents with the above complaint. History confirmed with patient.  Returns for at risk foot care.  Doing very well his skin remains healed  Objective:  Physical Exam: warm, good capillary refill, no trophic changes or ulcerative lesions, venous stasis dermatitis noted, and varicose veins noted.  Pulses are weakly palpable.  Thickened elongated nails x10 with yellow-brown discoloration and subungual debris.  No recurrence of ulceration or preulcerative callus Assessment:   1. Pain due to onychomycosis of toenails of both feet   2. Diabetic polyneuropathy associated with type 2 diabetes mellitus ([place])      Plan:  Patient was evaluated and treated and all questions answered.  Discussed the etiology and treatment options for the condition in detail with the patient. Educated patient on the topical and oral treatment options for mycotic nails. Recommended debridement of the nails today. Sharp and mechanical debridement performed of all painful and mycotic nails today. Nails debrided in length and thickness using a nail nipper to level of comfort. Discussed treatment options including appropriate shoe gear. Follow up as needed for painful nails.   No follow-ups on file.

## 2022-05-08 DIAGNOSIS — Z7984 Long term (current) use of oral hypoglycemic drugs: Secondary | ICD-10-CM | POA: Diagnosis not present

## 2022-05-08 DIAGNOSIS — Z961 Presence of intraocular lens: Secondary | ICD-10-CM | POA: Diagnosis not present

## 2022-05-08 DIAGNOSIS — E119 Type 2 diabetes mellitus without complications: Secondary | ICD-10-CM | POA: Diagnosis not present

## 2022-05-08 DIAGNOSIS — H35363 Drusen (degenerative) of macula, bilateral: Secondary | ICD-10-CM | POA: Diagnosis not present

## 2022-05-08 DIAGNOSIS — H35033 Hypertensive retinopathy, bilateral: Secondary | ICD-10-CM | POA: Diagnosis not present

## 2022-05-08 DIAGNOSIS — H5203 Hypermetropia, bilateral: Secondary | ICD-10-CM | POA: Diagnosis not present

## 2022-05-08 DIAGNOSIS — H04123 Dry eye syndrome of bilateral lacrimal glands: Secondary | ICD-10-CM | POA: Diagnosis not present

## 2022-05-08 DIAGNOSIS — H26493 Other secondary cataract, bilateral: Secondary | ICD-10-CM | POA: Diagnosis not present

## 2022-05-08 DIAGNOSIS — H43813 Vitreous degeneration, bilateral: Secondary | ICD-10-CM | POA: Diagnosis not present

## 2022-05-08 LAB — HM DIABETES EYE EXAM

## 2022-05-09 ENCOUNTER — Ambulatory Visit: Payer: Medicare PPO | Admitting: Pulmonary Disease

## 2022-05-09 ENCOUNTER — Other Ambulatory Visit: Payer: Self-pay

## 2022-05-09 ENCOUNTER — Encounter: Payer: Self-pay | Admitting: Pulmonary Disease

## 2022-05-09 VITALS — BP 122/78 | HR 55 | Ht 71.0 in | Wt 250.2 lb

## 2022-05-09 DIAGNOSIS — I10 Essential (primary) hypertension: Secondary | ICD-10-CM

## 2022-05-09 DIAGNOSIS — G4733 Obstructive sleep apnea (adult) (pediatric): Secondary | ICD-10-CM | POA: Diagnosis not present

## 2022-05-09 DIAGNOSIS — I110 Hypertensive heart disease with heart failure: Secondary | ICD-10-CM

## 2022-05-09 DIAGNOSIS — N1831 Chronic kidney disease, stage 3a: Secondary | ICD-10-CM

## 2022-05-09 NOTE — Progress Notes (Signed)
Shawn Blanchard    [id number]    1935-08-02  Primary Care Physician:[name], [name], MD  Referring Physician: Haydee Salter, [place],  [place] [address]  Chief complaint:   Patient with a history of obstructive sleep apnea  HPI:  Has been using CPAP for many years  Continues to tolerate CPAP well Breathing is good Has not had any significant problems recently No significant changes to his health  He does admit to having some difficulty with dexterity affecting getting his mask on and getting it off in the morning  Compliance data does reveal mask leaks  He does follow-up with [employer]-his DME  Wakes up in the morning feeling like he is at a good nights rest  His weight is stable, trending down  He does try to stay active  Multiple comorbidities including history of hypertension, coronary artery disease, heart failure, atrial fibrillation, diabetes, neuropathy, restless legs  He still does have daytime fatigue  He does exercise regularly goes to the gym about 3 days a week  Did not smoke in the past  Outpatient Encounter Medications as of 05/09/2022  Medication Sig   allopurinol (ZYLOPRIM) 100 MG tablet Take 1 tablet (100 mg total) by mouth daily.   Alpha-Lipoic Acid 600 MG TABS Take 600 mg by mouth in the morning and at bedtime.   b complex vitamins capsule Take 1 capsule by mouth daily.   carvedilol (COREG) 3.125 MG tablet Take 1 tablet (3.125 mg total) by mouth 2 (two) times daily with a meal.   Cyanocobalamin (B12 LIQUID HEALTH BOOSTER PO) Take 1 Drop/kg by mouth daily.   dabigatran (PRADAXA) 150 MG CAPS capsule TAKE 1 CAPSULE BY MOUTH 2 TIMES DAILY   ferrous sulfate (FEROSUL) 325 (65 FE) MG tablet TAKE 1 TABLET BY MOUTH 2 TIMES A DAY   furosemide (LASIX) 20 MG tablet Take 2 tablets (40 mg total) by mouth daily with breakfast AND 1 tablet (20 mg total) daily before lunch.   glucose blood test strip OneTouch Ultra Blue Test  Strip   hydrocortisone 2.5 % ointment Apply 1 application topically 2 (two) times daily as needed (Skin Irritation).   JANUMET XR [phone] MG TB24 Take 1 tablet by mouth every morning.   lactobacillus acidophilus (BACID) TABS tablet Take 1 tablet by mouth 3 (three) times daily.   Lancets (ONETOUCH DELICA PLUS [id number]) MISC OneTouch Delica Plus Lancet 33 gauge   Melatonin 5 MG CAPS Take 5 mg by mouth at bedtime.   metFORMIN (GLUCOPHAGE-XR) 500 MG 24 hr tablet Take 1 tablet (500 mg total) by mouth daily in the afternoon.   OVER THE COUNTER MEDICATION Take 25 mg by mouth daily. Balance of Nature: Fruits and Vegs supplements   polyethylene glycol (MIRALAX) 17 g packet Take 17 g by mouth daily as needed.   polyvinyl alcohol (LIQUIFILM TEARS) 1.4 % ophthalmic solution Place 1 drop into both eyes as needed for dry eyes.   REPATHA SURECLICK 621 MG/ML SOAJ INJECT '140MG'$  INTO THE SKIN EVERY 14 DAYS   Spacer/Aero-Holding Dorise Bullion Use with albuterol inhaler   No facility-administered encounter medications on file as of 05/09/2022.    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) 06/22/2021   Arthritis 03/12/2016   Blepharitis of both upper and lower eyelid 10/02/2018   BMI 35.0-35.9,adult 04/11/2016   Carpal tunnel syndrome of right wrist 03/25/2019   Chronic anticoagulation 10/11/2016   Chronic atrial fibrillation ([place]) 06/12/2015   Overview:   Managed CARDS   Chronic diastolic heart failure ([place]) 09/01/2015   Class 1 obesity 08/08/2021   Coronary artery disease involving native coronary artery of native heart with angina pectoris ([place]) 02/15/2015   Overview:  Hx of MI with stent to LAD 2002 MPS 2016 with EF 47% and no ischemia   Dermatochalasis of both upper eyelids 11/21/2016   Diabetic foot infection ([place]) 08/08/2021   Diabetic polyneuropathy associated with type 2 diabetes mellitus ([place]) 12/16/2018   2020   Disorder of sesamoid bone of foot    Essential hypertension 11/07/2017   Floppy eyelid syndrome of both eyes 10/05/2019   Ganglion cyst 11/27/2015   Overview:  2017: dorsum right hand   Gastroesophageal reflux disease without esophagitis 06/12/2015   Gout of left foot    History of lymphoma 05/17/2021   History of squamous cell carcinoma in situ 04/18/2021   Hyperlipidemia 04/11/2016   Hypertensive heart disease with heart failure ([place]) 02/15/2015   Hypertensive urgency 05/17/2021   Idiopathic chronic gout 05/17/2015   Insomnia 11/27/2015   Iron deficiency anemia 04/27/2021   Leukocytosis 04/11/2016   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[place]) 08/30/2015   Male erectile disorder 06/17/2016   Meibomian gland dysfunction (MGD) of both eyes 10/02/2018   Normocytic anemia 05/17/2021   Obstructive sleep apnea 08/30/2015   Overview:  CPAP   Old MI (myocardial infarction)    Orthostatic dizziness 09/15/2017   2018: med related   Peripheral venous insufficiency 11/08/2020   Peritoneal adhesion 11/08/2020   Pseudophakia of both eyes 03/12/2016   Pulmonary hypertension ([place]) 08/30/2015   Overview:  Managed CARDS   Restless leg syndrome 08/30/2015   SBO (small bowel obstruction) ([place]) 12/14/2019   Sensorineural hearing loss (SNHL), bilateral 10/04/2019   Small bowel obstruction ([place]) 04/11/2016   Stage 3a chronic kidney disease ([place]) 06/13/2019   Statin intolerance 03/09/2021   Type 2 diabetes mellitus with neurologic complication, without  long-term current use of insulin ([place]) 12/02/2014   Type 2 diabetes mellitus with stage 3a chronic kidney disease, without long-term current use of insulin ([place]) 02/22/2020   Ulcer of left foot with fat layer exposed ([place])    UTI (urinary tract infection) 05/17/2021    Past Surgical History:  Procedure Laterality Date   ABDOMINAL ADHESION SURGERY     x 2   BLEPHAROPLASTY Bilateral    CARPAL TUNNEL RELEASE Right 2021   CATARACT EXTRACTION Bilateral    CERVICAL SPINE SURGERY     CHOLECYSTECTOMY     CORONARY ANGIOPLASTY WITH STENT PLACEMENT  2000   IRRIGATION AND DEBRIDEMENT FOOT Left 08/11/2021   Procedure: IRRIGATION AND DEBRIDEMENT FOOT, AND BONE BIOPSY, PARTIAL SESMOIDECTOMY;  Surgeon: Criselda Peaches, DPM;  Location: [facility];  Service: Podiatry;  Laterality: Left;   NASAL SINUS SURGERY     ORIF TIBIAL SHAFT FRACTURE W/ PLATES AND SCREWS Left    PARTIAL KNEE ARTHROPLASTY Bilateral    SMALL INTESTINE SURGERY  Adenocarcinoma   TONSILLECTOMY      Family History  Problem Relation Age of Onset   Cancer Mother        Ovarian   Diabetes Mother    Heart disease Mother    Heart disease Father    Stroke Father    Diabetes Father    Diabetes Sister    Diabetes Brother    Diabetes Brother    Cancer Paternal Grandfather        Prostate   Colon cancer Neg Hx    Rectal cancer Neg Hx    Pancreatic cancer Neg Hx    Liver cancer Neg Hx    Stomach cancer Neg Hx     Social History   Socioeconomic History   Marital status: Married    Spouse name: Not on file   Number of children: 2   Years of education: Not on file   Highest education level: Not on file  Occupational History   Not on file  Tobacco Use   Smoking status: Never   Smokeless tobacco: Never  Vaping Use   Vaping Use: Never used  Substance and Sexual Activity   Alcohol use: Not Currently   Drug use: Never   Sexual activity: Yes  Other Topics Concern   Not on file  Social History Narrative   Former high school  principal.   Social Determinants of Health   Financial Resource Strain: Low Risk  (03/19/2022)   Overall Financial Resource Strain (CARDIA)    Difficulty of Paying Living Expenses: Not hard at all  Food Insecurity: No Food Insecurity (03/19/2022)   Hunger Vital Sign    Worried About Running Out of Food in the Last Year: Never true    Ran Out of Food in the Last Year: Never true  Transportation Needs: No Transportation Needs (03/19/2022)   PRAPARE - Hydrologist (Medical): No    Lack of Transportation (Non-Medical): No  Physical Activity: Inactive (03/19/2022)   Exercise Vital Sign    Days of Exercise per Week: 0 days    Minutes of Exercise per Session: 0 min  Stress: No Stress Concern Present (03/19/2022)   [place]    Feeling of Stress : Not at all  Social Connections: [place] (03/07/2021)   Social Connection and Isolation Panel [NHANES]    Frequency of Communication with Friends and Family: Twice a week    Frequency of Social Gatherings with Friends and Family: Twice a week    Attends Religious Services: More than 4 times per year    Active Member of [employer] or Organizations: Yes    Attends Music therapist: More than 4 times per year    Marital Status: Married  Human resources officer Violence: Not At Risk (03/07/2021)   Humiliation, Afraid, Rape, and Kick questionnaire    Fear of Current or Ex-Partner: No    Emotionally Abused: No    Physically Abused: No    Sexually Abused: No    Review of Systems  Constitutional:  Negative for fatigue.  Respiratory:  Positive for apnea and shortness of breath.   Psychiatric/Behavioral:  Positive for sleep disturbance.     Vitals:   05/09/22 0904  BP: 122/78  Pulse: (!) 55  SpO2: 95%     Physical Exam Constitutional:      Appearance: He is obese.  HENT:     Head: Normocephalic.     Mouth/Throat:  Mouth:  Mucous membranes are moist.  Eyes:     General: No scleral icterus. Cardiovascular:     Rate and Rhythm: Normal rate and regular rhythm.     Heart sounds: No murmur heard.    No friction rub.  Pulmonary:     Effort: No respiratory distress.     Breath sounds: No stridor. No wheezing or rhonchi.  Musculoskeletal:     Cervical back: No rigidity or tenderness.  Neurological:     Mental Status: He is alert.  Psychiatric:        Mood and Affect: Mood normal.      Data Reviewed:  Most recent CPAP compliance from 02/08/2022-05/08/2022 shows 100% compliance Average use of 8 hours 10 minutes CPAP of 12 Significant leaks Residual AHI of 12.2   Previous CPAP compliance shows 100% compliance Average use of 8 hours 48 minutes Pressure setting of 12 Occasional mask leaks Residual AHI of 4.6  Most recent echocardiogram with ejection fraction of 45 to 50% decreased ventricular function Moderate pulmonary hypertension  Assessment:   History of obstructive sleep apnea -Compliant with CPAP use  Continues to do well with CPAP therapy  Having some issues with his mask -We did discuss options regarding his mask -We did discuss ways of not having to adjust the mask as much -Trial with a new mask  As it continues to feel very well, waking up feeling like he is at a decent nights rest, not significantly tired or sleepy during the day, I do not believe we need to make pressure changes at present  Pulmonary hypertension -Likely multifactorial -Continue management of heart failure -Continue management for obstructive sleep apnea   Plan/Recommendations: Continue CPAP therapy  DME referral for CPAP supplies  Encouraged regular exercises  Weight loss efforts as tolerated  Encouraged to call with any significant concerns  Follow-up in 6 months   Sherrilyn Rist MD [facility] 05/09/2022, 9:34 AM  CC: Haydee Salter, MD

## 2022-05-09 NOTE — Patient Instructions (Signed)
Follow-up in 6 months  DME referral for CPAP supplies -Trial with a new different mask -Current mask does leak -Issues with dexterity  Continue using your CPAP on a nightly basis  Continue graded exercise as tolerated  Call us with significant concerns

## 2022-05-10 DIAGNOSIS — N1831 Chronic kidney disease, stage 3a: Secondary | ICD-10-CM | POA: Diagnosis not present

## 2022-05-10 NOTE — Addendum Note (Signed)
Addended by: Konrad Saha on: 05/10/2022 02:32 PM   Modules accepted: Orders

## 2022-05-12 ENCOUNTER — Encounter: Payer: Self-pay | Admitting: Family Medicine

## 2022-05-16 ENCOUNTER — Other Ambulatory Visit: Payer: Self-pay | Admitting: Family Medicine

## 2022-05-17 DIAGNOSIS — E1122 Type 2 diabetes mellitus with diabetic chronic kidney disease: Secondary | ICD-10-CM | POA: Diagnosis not present

## 2022-05-17 DIAGNOSIS — I129 Hypertensive chronic kidney disease with stage 1 through stage 4 chronic kidney disease, or unspecified chronic kidney disease: Secondary | ICD-10-CM | POA: Diagnosis not present

## 2022-05-17 DIAGNOSIS — E669 Obesity, unspecified: Secondary | ICD-10-CM | POA: Diagnosis not present

## 2022-05-17 DIAGNOSIS — N1831 Chronic kidney disease, stage 3a: Secondary | ICD-10-CM | POA: Diagnosis not present

## 2022-05-17 DIAGNOSIS — N183 Chronic kidney disease, stage 3 unspecified: Secondary | ICD-10-CM | POA: Diagnosis not present

## 2022-05-20 ENCOUNTER — Telehealth: Payer: Self-pay

## 2022-05-20 NOTE — Progress Notes (Signed)
Care Management & Coordination Services Pharmacy Team  Reason for Encounter: Appointment Reminder  Contacted patient to confirm in office appointment with Daron Offer , PharmD on 05/23/2022 at 10:00 am.  Spoke with family on 05/20/2022   Do you have any problems getting your medications? No If yes what types of problems are you experiencing?  Patient wife denies any issue getting the patient medications.   What is your top health concern you would like to discuss at your upcoming visit?  Patient wife states the only concern they have is the patient is bruising under his skin.  Have you seen any other providers since your last visit with PCP? No   Patient is aware to  bring medications and supplements to appointment or a list of the patient is taking.   Chart review:  Recent office visits:  04/26/2022 Dr. [name] MD (PCP) No Medication Changes noted,Lab work completed - (A1C- 6.0)  return in 3 months 03/19/2022 [name] LPN (PCP Office) Medicare Wellness completed, no medication changes noted 03/18/2022 Dr. [name] MD (PCP) Increase his Lasix to 40 mg each morning and continue 20 mg each evening, Ambulatory referral to Cardiology   01/11/2022 Dr. [name] MD (PCP) No Medication changes noted, Ambulatory referral to Pulmonology, Return in about 3 months    Recent consult visits:  05/09/2022 (Pulmonary) No Medication changes noted, No orders placed, Follow-up in 6 months  05/08/2022 Dr. [name] MD (Ophthalmology) No Medication changes noted 05/02/2022 [name] DPM (Podiatry) No Medication Changes noted 03/28/2022 Dr. [name] MD (Cardiology) Start Nitroglycerin 0.4 mg PRN, No orders placed. 02/08/2022 Dr. [name] MD (Pulmonary) No Medication changes noted, DME referral for CPAP supplies, Follow-up in about 3 months  02/05/2022 [name] PA (Dermatology) Start Hydrocortisone 2.5% PRN 01/24/2022 [name] DPM (Podiatry) No Medication Changes noted 01/10/2022 Dr. [name] MD  (Nephrology) No Medications changes noted, return in 4 months  Hospital visits:  None in previous 6 months   Star Rating Drugs:  Medication: Metformin 500 mg Last Fill: 02/08/2022 90 Day Supply at [facility] Medication: Repatha 140 mg Last Fill: 05/02/2022 28 Day Supply at [facility] Medication: Janumet [phone] Last Fill: 05/16/2022 90 Day Supply at [facility]  Care Gaps: Annual wellness visit in last year? Yes, Last completed 03/19/2022 Covid 19 Vaccine  If Diabetic: Last eye exam / retinopathy screening:Last completed 04/12/2022 per chart note Last diabetic foot exam:Last completed 01/11/2022.   [place] Pharmacist Assistant [phone]

## 2022-05-22 DIAGNOSIS — R0689 Other abnormalities of breathing: Secondary | ICD-10-CM | POA: Diagnosis not present

## 2022-05-22 DIAGNOSIS — G4733 Obstructive sleep apnea (adult) (pediatric): Secondary | ICD-10-CM | POA: Diagnosis not present

## 2022-06-01 ENCOUNTER — Telehealth: Payer: Medicare PPO

## 2022-06-06 ENCOUNTER — Ambulatory Visit: Payer: Medicare PPO

## 2022-06-06 DIAGNOSIS — I482 Chronic atrial fibrillation, unspecified: Secondary | ICD-10-CM

## 2022-06-06 DIAGNOSIS — E1122 Type 2 diabetes mellitus with diabetic chronic kidney disease: Secondary | ICD-10-CM

## 2022-06-06 NOTE — Patient Instructions (Signed)
Visit Information It was great speaking with you today!  Please let me know if you have any questions about our visit.  Plan:  We could consider switching your metformin and JanuMet for a once injectable medication like Ozempic. Please discuss with Dr. [name] regarding switching your Dabigatran to a different blood thinner due to the drug interaction with your carvedilol.  You can try using some Icy Hot cream to help with the tingling in your arms and legs.   Print copy of patient instructions, educational materials, and care plan provided in person.  Telephone follow up appointment with pharmacy team member scheduled for: 09/05/2022 at 9:00 AM  Junius Argyle, PharmD, Para March, CPP Clinical Pharmacist Practitioner  [facility]  [phone]

## 2022-06-06 NOTE — Progress Notes (Signed)
Care Management & Coordination Services Pharmacy Note  06/06/2022 Name:  Shawn Blanchard MRN:  [id number] DOB:  Nov 20, 1935  Summary: Patient presents for initial pharmacy consult. He presents today with his daughter, Shawn Blanchard.   -Patient reports frequent bruising, but denies any serious/unusual bleeding. DDI with Carvedilol + Dabigatran increasing bleeding risk.   -Patient concerned with worsening neuropathy symptoms. He reports tingling in both arms and legs throughout the day.    Recommendations/Changes made from today's visit: -Recommended Capsicin/Icy Hot cream to manage neuropathy   -Given history of ASCVD and CKD recommended switching regimen to a GLP-1 agonist or SGLT2 inhibitor. Patient was potentially agreeable, but wanted to think it over.   Discussed potentially switching to alternate anticoagulant such as Eliquis. Patient will discuss at next Cardiology follow-up.   Follow up plan: CPP follow-up 3 months    Subjective: Shawn Blanchard is an 87 y.o. year old male who is a primary patient of [name], [name], MD.  The care coordination team was consulted for assistance with disease management and care coordination needs.    Engaged with patient face to face for initial visit.  Recent office visits: 04/26/2022 Dr. [name] MD (PCP) No Medication Changes noted,Lab work completed - (A1C- 6.0)  return in 3 months 03/19/2022 [name] LPN (PCP Office) Medicare Wellness completed, no medication changes noted 03/18/2022 Dr. [name] MD (PCP) Increase his Lasix to 40 mg each morning and continue 20 mg each evening, Ambulatory referral to Cardiology   01/11/2022 Dr. [name] MD (PCP) No Medication changes noted, Ambulatory referral to Pulmonology, Return in about 3 months  Recent consult visits: 05/09/2022 (Pulmonary) No Medication changes noted, No orders placed, Follow-up in 6 months  05/08/2022 Dr. [name] MD (Ophthalmology) No Medication changes noted 05/02/2022 [name] DPM  (Podiatry) No Medication Changes noted 03/28/2022 Dr. [name] MD (Cardiology) Start Nitroglycerin 0.4 mg PRN, No orders placed. 02/08/2022 Dr. [name] MD (Pulmonary) No Medication changes noted, DME referral for CPAP supplies, Follow-up in about 3 months  02/05/2022 [name] PA (Dermatology) Start Hydrocortisone 2.5% PRN 01/24/2022 [name] DPM (Podiatry) No Medication Changes noted 01/10/2022 Dr. [name] MD (Nephrology) No Medications changes noted, return in 4 months  Hospital visits: None in previous 6 months   Objective:  Lab Results  Component Value Date   CREATININE 1.42 05/01/2022   BUN 27 (H) 05/01/2022   GFR 45.45 (L) 05/01/2022   GFRNONAA >60 08/12/2021   GFRAA >60 12/17/2019   NA 143 05/01/2022   K 4.5 05/01/2022   CALCIUM 9.4 05/01/2022   CO2 27 05/01/2022   GLUCOSE 112 (H) 05/01/2022    Lab Results  Component Value Date/Time   HGBA1C 6.0 04/26/2022 08:52 AM   HGBA1C 6.4 01/11/2022 09:15 AM   GFR 45.45 (L) 05/01/2022 09:02 AM   GFR 43.63 (L) 03/18/2022 02:45 PM   MICROALBUR 4.3 (H) 09/24/2021 03:02 PM   MICROALBUR 0.9 11/09/2020 07:57 AM    Last diabetic Eye exam:  Lab Results  Component Value Date/Time   HMDIABEYEEXA No Retinopathy 04/12/2021 12:00 AM    Last diabetic Foot exam: No results found for: "HMDIABFOOTEX"   Lab Results  Component Value Date   CHOL 63 06/22/2021   HDL 34.10 (L) 06/22/2021   LDLCALC 13 06/22/2021   TRIG 79.0 06/22/2021   CHOLHDL 2 06/22/2021       Latest Ref Rng & Units 09/25/2021    3:18 PM 08/08/2021    9:41 AM 05/16/2021    9:59 PM  Hepatic Function  Total Protein 6.0 - 8.3 g/dL 7.2  7.1  7.6   Albumin 3.5 - 5.2 g/dL 3.8  4.1  4.5   AST 0 - 37 U/L '21  15  16   '$ ALT 0 - 53 U/L '13  12  11   '$ Alk Phosphatase 39 - 117 U/L 88  50  48   Total Bilirubin 0.2 - 1.2 mg/dL 0.8  1.5  0.9     Lab Results  Component Value Date/Time   TSH 1.51 03/29/2021 10:35 AM   FREET4 0.94 03/29/2021 10:35 AM       Latest Ref Rng &  Units 09/25/2021    3:18 PM 08/12/2021    6:11 AM 08/11/2021    6:08 AM  CBC  WBC 4.0 - 10.5 K/uL 6.9  8.4  7.8   Hemoglobin 13.0 - 17.0 g/dL 11.4  11.2  10.7   Hematocrit 39.0 - 52.0 % 35.6  38.4  34.0   Platelets 150.0 - 400.0 K/uL 214.0  195  194     Lab Results  Component Value Date/Time   VD25OH 37.03 03/29/2021 10:35 AM   VITAMINB12 616 03/29/2021 10:35 AM    Clinical ASCVD: Yes  The ASCVD Risk score (Arnett DK, et al., 2019) failed to calculate for the following reasons:   The 2019 ASCVD risk score is only valid for ages 78 to 46   The patient has a prior MI or stroke diagnosis       03/19/2022    8:19 AM 09/24/2021    2:06 PM 07/24/2021    2:38 PM  Depression screen PHQ 2/9  Decreased Interest 0 0 0  Down, Depressed, Hopeless 0 0 0  PHQ - 2 Score 0 0 0     Social History   Tobacco Use  Smoking Status Never  Smokeless Tobacco Never   BP Readings from Last 3 Encounters:  05/09/22 122/78  04/26/22 126/64  03/28/22 134/64   Pulse Readings from Last 3 Encounters:  05/09/22 (!) 55  04/26/22 (!) 56  03/28/22 (!) 56   Wt Readings from Last 3 Encounters:  05/09/22 250 lb 3.2 oz (113.5 kg)  04/26/22 251 lb 6.4 oz (114 kg)  03/28/22 251 lb 6.4 oz (114 kg)   BMI Readings from Last 3 Encounters:  05/09/22 34.90 kg/m  04/26/22 35.06 kg/m  03/28/22 35.06 kg/m    Allergies  Allergen Reactions   Ace Inhibitors Rash   Amoxicillin Nausea Only   Atorvastatin Other (See Comments)    Dizziness.   Jardiance [Empagliflozin] Other (See Comments)    Orthostasis   Meperidine Rash   Naproxen Sodium Rash   Sulfa Antibiotics Nausea Only   Tetracycline Nausea Only    Medications Reviewed Today     Reviewed by June Leap, CMA (Certified Medical Assistant) on 05/09/22 at [phone]  Med List Status: <None>   Medication Order Taking? Sig Documenting Provider Last Dose Status Informant  allopurinol (ZYLOPRIM) 100 MG tablet [id number] Yes Take 1 tablet (100 mg total) by  mouth daily. Haydee Salter, MD Taking Active   Alpha-Lipoic Acid 600 MG TABS [id number] Yes Take 600 mg by mouth in the morning and at bedtime. [provider] Taking Active Multiple Informants  b complex vitamins capsule [id number] Yes Take 1 capsule by mouth daily. [provider] Taking Active Multiple Informants  carvedilol (COREG) 3.125 MG tablet [id number] Yes Take 1 tablet (3.125 mg total) by mouth 2 (two) times daily with a meal. Shirlee More  J, MD Taking Active   Cyanocobalamin (B12 LIQUID HEALTH BOOSTER PO) [id number] Yes Take 1 Drop/kg by mouth daily. [provider] Taking Active   dabigatran (PRADAXA) 150 MG CAPS capsule [id number] Yes TAKE 1 CAPSULE BY MOUTH 2 TIMES DAILY [name] Hilton Cork, MD Taking Active   ferrous sulfate (FEROSUL) 325 (65 FE) MG tablet [id number] Yes TAKE 1 TABLET BY MOUTH 2 TIMES A DAY [name], [name], MD Taking Active   furosemide (LASIX) 20 MG tablet [id number] Yes Take 2 tablets (40 mg total) by mouth daily with breakfast AND 1 tablet (20 mg total) daily before lunch. Haydee Salter, MD Taking Active   glucose blood test strip [id number] Yes OneTouch Ultra Blue Test Strip [provider] Taking Active Multiple Informants  hydrocortisone 2.5 % ointment [id number] Yes Apply 1 application topically 2 (two) times daily as needed (Skin Irritation). [provider] Taking Active Multiple Informants  JANUMET XR [id number] MG TB24 [id number] Yes Take 1 tablet by mouth every morning. [provider] Taking Active   lactobacillus acidophilus (BACID) TABS tablet [id number] Yes Take 1 tablet by mouth 3 (three) times daily. [provider] Taking Active   Lancets (ONETOUCH DELICA PLUS [id number]) [place] [id number] Yes OneTouch Delica Plus Lancet 67 gauge [provider] Taking Active Multiple Informants  Melatonin 5 MG CAPS [id number] Yes Take 5 mg by mouth at bedtime. [provider] Taking Active Multiple  Informants  metFORMIN (GLUCOPHAGE-XR) 500 MG 24 hr tablet [id number] Yes Take 1 tablet (500 mg total) by mouth daily in the afternoon. Haydee Salter, MD Taking Active Multiple Informants  OVER THE COUNTER MEDICATION [id number] Yes Take 25 mg by mouth daily. Balance of Nature: Fruits and Vegs supplements [provider] Taking Active   polyethylene glycol (MIRALAX) 17 g packet [id number] Yes Take 17 g by mouth daily as needed. Kayleen Memos, DO Taking Active Multiple Informants           Med Note (GARNER, TIFFANY L   Tue Aug 28, 2021  2:56 PM)    polyvinyl alcohol (LIQUIFILM TEARS) 1.4 % ophthalmic solution [id number] Yes Place 1 drop into both eyes as needed for dry eyes. [provider] Taking Active Multiple Informants  REPATHA SURECLICK [id number] MG/ML Darden Palmer 0000000 Yes INJECT '140MG'$  INTO THE SKIN EVERY 80 DAYS Richardo Priest, MD Taking Active   Spacer/Aero-Holding Dorise Bullion [id number] Yes Use with albuterol inhaler [name] [name], MD Taking Active             SDOH:  (Social Determinants of Health) assessments and interventions performed: Yes SDOH Interventions    Flowsheet Row Clinical Support from 03/19/2022 in [place] at [place] from 03/07/2021 in [place] at [place] Interventions    Food Insecurity Interventions Intervention Not Indicated Intervention Not Indicated  Housing Interventions -- Intervention Not Indicated  Transportation Interventions Intervention Not Indicated Intervention Not Indicated  Financial Strain Interventions Intervention Not Indicated Intervention Not Indicated  Physical Activity Interventions Patient Refused, Other (Comments) Intervention Not Indicated  Stress Interventions Intervention Not Indicated Intervention Not Indicated  Social Connections Interventions -- Intervention Not Indicated       Medication Assistance: None required.  Patient affirms  current coverage meets needs.  Medication Access: Within the past 30 days, how often has patient missed a dose of medication? None Is a pillbox or other method used to improve adherence? Yes  Factors that may affect medication adherence? no barriers identified  Are meds synced by current pharmacy? No  Are meds delivered by current pharmacy? No  Does patient experience delays in picking up medications due to transportation concerns? No   [facility] Services Reviewed: Is patient disadvantaged to use [facility]?: No  Current Rx insurance plan: [employer] Name and location of Current pharmacy:  [place], [place] - [address] [place] [address] Phone: [phone] Fax: [phone]  [id number] [place], [place] - [id number] [id number]: [phone]: [phone] [facility] services reviewed with patient today?: Yes  Patient requests to transfer care to [facility]?: No  Reason patient declined to change pharmacies: Hesitance/Possibly interested in future  Compliance/Adherence/Medication fill history: Care Gaps: Annual wellness visit in last year? Yes, Last completed 03/19/2022 Covid 19 Vaccine   If Diabetic: Last eye exam / retinopathy screening:Last completed 04/12/2022 per chart note Last diabetic foot exam:Last completed 01/11/2022   Star-Rating Drugs: Medication: Metformin 500 mg Last Fill: 02/08/2022 90 Day Supply at [facility] Medication: Repatha 140 mg Last Fill: 05/02/2022 28 Day Supply at [facility] Medication: Janumet [phone] Last Fill: 05/16/2022 90 Day Supply at [facility]   Assessment/Plan  Heart Failure (Goal: manage symptoms and prevent exacerbations) -Controlled -Last ejection fraction: 45-50% (Date: May 2023) -HF type: HFmrEF (mildly reduced EF 41-49%) -NYHA Class: II (slight limitation of activity) -AHA  HF Stage: C (Heart disease and symptoms present) -Current treatment: Carvedilol 3.125 mg twice daily  Furosemide 40 mg daily  -Medications previously tried: ACEi (rash), Jardaince (Orthostasis)  -Recommended to continue current medication  Atrial Fibrillation (Goal: prevent stroke and major bleeding) -Uncontrolled -CHADSVASC: 6 -Current treatment: Rate control: Carvedilol 3.125 mg twice daily Anticoagulation: Dabigatran 150 mg twice daily  -Medications previously tried: NA -Patient reports frequent bruising, but denies any serious/unusual bleeding. DDI with Carvedilol + Dabigatran increasing bleeding risk. Discussed potentially switching to alternate anticoagulant such as Eliquis. Patient will discuss at next Cardiology follow-up.  -Recommended to continue current medication  Hyperlipidemia: (LDL goal < 55) -Controlled -History of CAD s/p PCI 2002 -Current treatment: Repatha 140 mg every 2 weeks  -Medications previously tried: Statins  -Recommended to continue current medication  Diabetes (A1c goal <8%) -Controlled -Current medications: Metformin ER 500 mg daily  JanuMet ER [phone] daily  -Medications previously tried: Jardiance (Orthostasis) -Patient concerned with worsening neuropathy symptoms. He reports tingling in both arms and legs throughout the day. He previously tried gabapentin but did not tolerate it.  -Denies hypoglycemic/hyperglycemic symptoms -Given history of ASCVD and CKD recommended switching regimen to a GLP-1 agonist or SGLT2 inhibitor. Patient was potentially agreeable, but wanted to think it over.  -Recommended Capsicin/Icy Hot cream to manage neuropathy   Chronic Kidney Disease Stage 3a  -All medications assessed for renal dosing and appropriateness in chronic kidney disease. -Recommended to continue current medication  AJunius Argyle PharmD, BPara March CPP Clinical Pharmacist Practitioner  [facility] [phone]

## 2022-06-07 DIAGNOSIS — H26491 Other secondary cataract, right eye: Secondary | ICD-10-CM | POA: Diagnosis not present

## 2022-06-14 DIAGNOSIS — H26492 Other secondary cataract, left eye: Secondary | ICD-10-CM | POA: Diagnosis not present

## 2022-06-19 ENCOUNTER — Encounter: Payer: Self-pay | Admitting: Pulmonary Disease

## 2022-06-19 DIAGNOSIS — G4733 Obstructive sleep apnea (adult) (pediatric): Secondary | ICD-10-CM

## 2022-06-19 NOTE — Telephone Encounter (Signed)
Received the following message from patient:   "Hey Dr. [name], I just received a call from [facility]. They were discussing with me that my father, Shawn Blanchard, pressure on CPap machine is set for 12. It looked like his HI number 14 per hour. Can you check this pressure and make sure it is OK.  My phone number is [phone] Thanks, [name] "  I have printed the download and will give it to Dr. [name] to review.   Dr. [name], can you please advise once you get a chance to review the download?

## 2022-06-21 NOTE — Telephone Encounter (Signed)
I looked at the download from the CPAP  There was evidence of significant mask leak AHI elevated, may be related to mask leak  Pressure may also need to be increased however, if the mask is already leaking, a higher pressure will probably cause more air leaks  First step will be DME referral for an attempt at a different mask to see if this works better   If despite changes to a mask and a higher pressure, still has elevated AHI The only other next step would be a titration study

## 2022-07-03 NOTE — Telephone Encounter (Signed)
  I just spoke  with [employer], and they stated that he has no evidence of the mask leaking?  and is not  eligible for different fitting due to insurance. Is there any way to change the pressure for now?  He is not due until May 20 for different headgear.      Dr. Jenetta Downer, can you please advise? Thanks!

## 2022-07-04 ENCOUNTER — Other Ambulatory Visit: Payer: Self-pay

## 2022-07-04 ENCOUNTER — Telehealth: Payer: Self-pay | Admitting: Pulmonary Disease

## 2022-07-04 DIAGNOSIS — G4733 Obstructive sleep apnea (adult) (pediatric): Secondary | ICD-10-CM

## 2022-07-04 NOTE — Telephone Encounter (Signed)
Increase CPAP pressure to 14  We should try and obtain a download about 2 weeks after the change

## 2022-07-04 NOTE — Telephone Encounter (Signed)
Spoke with patients wife. She spoke to [facility] yesterday and the insurance wont cover a mask fit, they advise there doesn't seem to be a mask leak. [facility] advised patients settings may need to be higher.   Dr. [name] can you please advise?

## 2022-07-15 NOTE — Telephone Encounter (Signed)
See MyChart message from 3/18.

## 2022-07-19 DIAGNOSIS — H5203 Hypermetropia, bilateral: Secondary | ICD-10-CM | POA: Diagnosis not present

## 2022-07-19 DIAGNOSIS — H26492 Other secondary cataract, left eye: Secondary | ICD-10-CM | POA: Diagnosis not present

## 2022-07-19 DIAGNOSIS — H52203 Unspecified astigmatism, bilateral: Secondary | ICD-10-CM | POA: Diagnosis not present

## 2022-07-19 DIAGNOSIS — H524 Presbyopia: Secondary | ICD-10-CM | POA: Diagnosis not present

## 2022-07-25 ENCOUNTER — Encounter: Payer: Self-pay | Admitting: Family Medicine

## 2022-07-25 ENCOUNTER — Ambulatory Visit: Payer: Medicare PPO | Admitting: Family Medicine

## 2022-07-25 ENCOUNTER — Other Ambulatory Visit (INDEPENDENT_AMBULATORY_CARE_PROVIDER_SITE_OTHER): Payer: Medicare PPO

## 2022-07-25 VITALS — BP 130/68 | HR 69 | Temp 98.2°F | Ht 71.0 in | Wt 250.8 lb

## 2022-07-25 DIAGNOSIS — N1831 Chronic kidney disease, stage 3a: Secondary | ICD-10-CM

## 2022-07-25 DIAGNOSIS — I10 Essential (primary) hypertension: Secondary | ICD-10-CM

## 2022-07-25 DIAGNOSIS — I5032 Chronic diastolic (congestive) heart failure: Secondary | ICD-10-CM

## 2022-07-25 DIAGNOSIS — E1122 Type 2 diabetes mellitus with diabetic chronic kidney disease: Secondary | ICD-10-CM

## 2022-07-25 DIAGNOSIS — I25119 Atherosclerotic heart disease of native coronary artery with unspecified angina pectoris: Secondary | ICD-10-CM

## 2022-07-25 DIAGNOSIS — J40 Bronchitis, not specified as acute or chronic: Secondary | ICD-10-CM | POA: Diagnosis not present

## 2022-07-25 LAB — HEMOGLOBIN A1C: Hgb A1c MFr Bld: 5.9 % (ref 4.6–6.5)

## 2022-07-25 LAB — GLUCOSE, RANDOM: Glucose, Bld: 103 mg/dL — ABNORMAL HIGH (ref 70–99)

## 2022-07-25 LAB — BRAIN NATRIURETIC PEPTIDE: Pro B Natriuretic peptide (BNP): 534 pg/mL — ABNORMAL HIGH (ref 0.0–100.0)

## 2022-07-25 MED ORDER — PREDNISONE 20 MG PO TABS: 20.0000 mg | ORAL_TABLET | Freq: Every day | ORAL | 0 refills | Status: DC

## 2022-07-25 MED ORDER — ALBUTEROL SULFATE HFA 108 (90 BASE) MCG/ACT IN AERS: 2.0000 | INHALATION_SPRAY | Freq: Four times a day (QID) | RESPIRATORY_TRACT | 2 refills | Status: DC | PRN

## 2022-07-25 NOTE — Assessment & Plan Note (Signed)
Blood pressure is in good control. Continue carvedilol 3.125 mg bid.

## 2022-07-25 NOTE — Assessment & Plan Note (Signed)
Stable. No chest pain. Continue carvedilol 3.125 mg bid.

## 2022-07-25 NOTE — Progress Notes (Signed)
[facility] [address] [place] [address] Dept: [phone] Dept Fax: [phone]  Chronic Care Office Visit  Subjective:    Patient ID: Shawn Blanchard, male    DOB: December 20, 1935, 87 y.o..   MRN: [id number]  Chief Complaint  Patient presents with   Medical Management of Chronic Issues    3 month f/u.   C/o having ST, cough x 1 week.    History of Present Illness:  Patient is in today for reassessment of chronic medical issues.  Shawn Blanchard has an extensive history of CV disease. He had a prior heart attack and is s/p PTCA with stent placement. He has chronic a. fib., HFrEF due to hypertensive heart disease, and pulmonary hypertension. He is currently managed on carvedilol 3.125 mg bid and Lasix. He is also on dabigatran (Pradaxa) 150 mg bid for anticoagulation. He has hyperlipidemia, but is intolerant of statins, so is managed on Repatha. His weight has been up mildly recently.  Shawn Blanchard  has noted a 1-week history of sore throat and cough. He states he has been wheezing as well. He does not have a history of asthma or COPD.  He denies any significant sneezing, or itchy eyes. He has not seen any increased swellign in his ankles.   Shawn Blanchard has a history of Type 2 diabetes. He is managed on sitagliptin/metformin (Janumet XR) [phone] mg daily and metformin 500 mg q pm. His diabetes is complicated with peripheral neuropathy and chronic kidney disease.  Past Medical History: Patient Active Problem List   Diagnosis Date Noted   Henoch-Schonlein purpura 01/11/2022   Ulcer of left foot with fat layer exposed    Disorder of sesamoid bone of foot    Gout of left foot    Diabetic foot infection 08/08/2021   Class 1 obesity 08/08/2021   Aortic atherosclerosis 06/22/2021   SBO (small bowel obstruction) 05/17/2021   Hypertensive urgency 05/17/2021   UTI (urinary tract infection) 05/17/2021   Normocytic anemia  05/17/2021   History of lymphoma 05/17/2021   Iron deficiency anemia 04/27/2021   History of squamous cell carcinoma in situ 04/18/2021   Statin intolerance 03/09/2021   Peritoneal adhesion 11/08/2020   Peripheral venous insufficiency 11/08/2020   Type 2 diabetes mellitus with stage 3a chronic kidney disease, without long-term current use of insulin 02/22/2020   Old MI (myocardial infarction)    Floppy eyelid syndrome of both eyes 10/05/2019   Sensorineural hearing loss (SNHL), bilateral 10/04/2019   Stage 3a chronic kidney disease 06/13/2019   Carpal tunnel syndrome of right wrist 03/25/2019   Diabetic polyneuropathy associated with type 2 diabetes mellitus 12/16/2018   Blepharitis of both upper and lower eyelid 10/02/2018   Meibomian gland dysfunction (MGD) of both eyes 10/02/2018   Essential hypertension 11/07/2017   Acute on chronic systolic CHF (congestive heart failure) 11/07/2017   Orthostatic dizziness 09/15/2017   Dermatochalasis of both upper eyelids 11/21/2016   Chronic anticoagulation 10/11/2016   Male erectile disorder 06/17/2016   Hyperlipidemia 04/11/2016   Small bowel obstruction 04/11/2016   BMI 35.0-35.9,adult 04/11/2016   Leukocytosis 04/11/2016   Arthritis 03/12/2016   Pseudophakia of both eyes 03/12/2016   Insomnia 11/27/2015   Ganglion cyst 11/27/2015   Chronic diastolic heart failure 09/01/2015   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/30/2015   Obstructive sleep apnea 08/30/2015   Pulmonary hypertension 08/30/2015   Restless leg syndrome 08/30/2015   Chronic atrial fibrillation 06/12/2015  Gastroesophageal reflux disease without esophagitis 06/12/2015   Idiopathic chronic gout 05/17/2015   Coronary artery disease involving native coronary artery of native heart with angina pectoris 02/15/2015   Hypertensive heart disease with heart failure 02/15/2015   Type 2 diabetes mellitus with peripheral neuropathy 12/02/2014    Past Surgical History:  Procedure Laterality Date   ABDOMINAL ADHESION SURGERY     x 2   BLEPHAROPLASTY Bilateral    CARPAL TUNNEL RELEASE Right 2021   CATARACT EXTRACTION Bilateral    CERVICAL SPINE SURGERY     CHOLECYSTECTOMY     CORONARY ANGIOPLASTY WITH STENT PLACEMENT  2000   IRRIGATION AND DEBRIDEMENT FOOT Left 08/11/2021   Procedure: IRRIGATION AND DEBRIDEMENT FOOT, AND BONE BIOPSY, PARTIAL SESMOIDECTOMY;  Surgeon: Edwin Cap, DPM;  Location: [facility];  Service: Podiatry;  Laterality: Left;   NASAL SINUS SURGERY     ORIF TIBIAL SHAFT FRACTURE W/ PLATES AND SCREWS Left    PARTIAL KNEE ARTHROPLASTY Bilateral    SMALL INTESTINE SURGERY     Adenocarcinoma   TONSILLECTOMY     Family History  Problem Relation Age of Onset   Cancer Mother        Ovarian   Diabetes Mother    Heart disease Mother    Heart disease Father    Stroke Father    Diabetes Father    Diabetes Sister    Diabetes Brother    Diabetes Brother    Cancer Paternal Grandfather        Prostate   Colon cancer Neg Hx    Rectal cancer Neg Hx    Pancreatic cancer Neg Hx    Liver cancer Neg Hx    Stomach cancer Neg Hx    Outpatient Medications Prior to Visit  Medication Sig Dispense Refill   allopurinol (ZYLOPRIM) 100 MG tablet Take 1 tablet (100 mg total) by mouth daily. 90 tablet 3   Alpha-Lipoic Acid 600 MG TABS Take 600 mg by mouth in the morning and at bedtime.     b complex vitamins capsule Take 1 capsule by mouth daily.     carvedilol (COREG) 3.125 MG tablet Take 1 tablet (3.125 mg total) by mouth 2 (two) times daily with a meal. 180 tablet 3   Cyanocobalamin (B-12) 1000 MCG SUBL Place 1,000 mg under the tongue.     dabigatran (PRADAXA) 150 MG CAPS capsule TAKE 1 CAPSULE BY MOUTH 2 TIMES DAILY 180 capsule 2   ferrous sulfate (FEROSUL) 325 (65 FE) MG tablet TAKE 1 TABLET BY MOUTH 2 TIMES A DAY 60 tablet 2   furosemide (LASIX) 20 MG tablet Take 2 tablets (40 mg total) by mouth daily with breakfast  AND 1 tablet (20 mg total) daily before lunch. 270 tablet 3   glucose blood test strip OneTouch Ultra Blue Test Strip     hydrocortisone 2.5 % ointment Apply 1 application topically 2 (two) times daily as needed (Skin Irritation).     lactobacillus acidophilus (BACID) TABS tablet Take 1 tablet by mouth 3 (three) times daily.     Lancets (ONETOUCH DELICA PLUS LANCET33G) MISC OneTouch Delica Plus Lancet 33 gauge     loperamide (IMODIUM A-D) 2 MG tablet Take 2 mg by mouth 4 (four) times daily as needed for diarrhea or loose stools.     Melatonin 5 MG CAPS Take 5 mg by mouth at bedtime.     metFORMIN (GLUCOPHAGE-XR) 500 MG 24 hr tablet Take 1 tablet (500 mg total) by mouth daily in  the afternoon. 90 tablet 3   OVER THE COUNTER MEDICATION Take 25 mg by mouth daily. Balance of Nature: Fruits and Vegs supplements     polyvinyl alcohol (LIQUIFILM TEARS) 1.4 % ophthalmic solution Place 1 drop into both eyes as needed for dry eyes.     psyllium (METAMUCIL) 58.6 % packet Take 1 packet by mouth at bedtime. With a full glass of water     REPATHA SURECLICK 140 MG/ML SOAJ INJECT 140MG  INTO THE SKIN EVERY 14 DAYS 2 mL 6   SitaGLIPtin-MetFORMIN HCl (JANUMET XR) [phone] MG TB24 TAKE 1 TABLET BY MOUTH EVERY DAY WITH BREAKFAST (FOR DIABETES) 90 tablet 0   Spacer/Aero-Holding Chambers DEVI Use with albuterol inhaler 1 each 0   No facility-administered medications prior to visit.   Allergies  Allergen Reactions   Ace Inhibitors Rash   Amoxicillin Nausea Only   Atorvastatin Other (See Comments)    Dizziness.   Jardiance [Empagliflozin] Other (See Comments)    Orthostasis   Meperidine Rash   Naproxen Sodium Rash   Sulfa Antibiotics Nausea Only   Tetracycline Nausea Only   Objective:   Today's Vitals   07/25/22 0822  BP: 130/68  Pulse: 69  Temp: 98.2 F (36.8 C)  TempSrc: Temporal  SpO2: 98%  Weight: 250 lb 12.8 oz (113.8 kg)  Height: 5\' 11"  (1.803 m)   Body mass index is 34.98 kg/m.   General:  Well developed, well nourished. No acute distress. HEENT: Normocephalic, non-traumatic. External ears normal. EAC and TMs normal bilaterally.   PERRL, EOMI. Conjunctiva clear. Nose clear without congestion or rhinorrhea. Mucous membranes   moist. Oropharynx clear. Good dentition. Neck: Supple. No lymphadenopathy. No thyromegaly. Lungs: Bilateral expiratory wheezing without rales or rhonchi. CV: RRR without murmurs or rubs. Pulses 2+ bilaterally. Extremities: No edema noted. Psych: Alert and oriented. Normal mood and affect.  Health Maintenance Due  Topic Date Due   OPHTHALMOLOGY EXAM  04/12/2022     Assessment & Plan:   Problem List Items Addressed This Visit       Cardiovascular and Mediastinum   Chronic diastolic heart failure - Primary    Weight is stable in the clinic. No hypoxia. Appears compensated, though wheezing could be secondary to acute HF rather than a viral issue. I will check a BNP to assess for acute HF. Continue carvedilol 3.125 mg bid and Lasix.      Relevant Orders   Brain natriuretic peptide   Coronary artery disease involving native coronary artery of native heart with angina pectoris    Stable. No chest pain. Continue carvedilol 3.125 mg bid.      Essential hypertension    Blood pressure is in good control. Continue carvedilol 3.125 mg bid.        Respiratory   Bronchitis    The acute cough and wheezing may be secondary to a viral bronchitis. I will give him a trial of an albuterol inhaler and a 1-week course of prednisone. I will plan to see him back next week for reassessment.      Relevant Medications   predniSONE (DELTASONE) 20 MG tablet   albuterol (VENTOLIN HFA) 108 (90 Base) MCG/ACT inhaler     Endocrine   Type 2 diabetes mellitus with stage 3a chronic kidney disease, without long-term current use of insulin    Plan to check his A1c today. He will continue sitagliptin/metformin (Janumet XR) [phone] mg daily and metformin 500 mg q pm.  Pharmacy has suggested consideration for a GLP-1 RA  or an SGLT2i, in light of his ASCVD and CKD. He does not have significant proteinuria and has already had issues with orthostasis. As such, I am not sure an SGLT2i would be appropriate. He is already on a DPP4. These are not recommended to be used concurrently with a GLP-1 RA. If his diabetes control were to slip, I might consider a change, but for now will stick to his current regimen.      Relevant Orders   Glucose, random   Hemoglobin A1c    Return in about 1 week (around 08/01/2022) for Reassessment.   Loyola Mast, MD

## 2022-07-25 NOTE — Assessment & Plan Note (Signed)
Plan to check his A1c today. He will continue sitagliptin/metformin (Janumet XR) [phone] mg daily and metformin 500 mg q pm. Pharmacy has suggested consideration for a GLP-1 RA or an SGLT2i, in light of his ASCVD and CKD. He does not have significant proteinuria and has already had issues with orthostasis. As such, I am not sure an SGLT2i would be appropriate. He is already on a DPP4. These are not recommended to be used concurrently with a GLP-1 RA. If his diabetes control were to slip, I might consider a change, but for now will stick to his current regimen.

## 2022-07-25 NOTE — Assessment & Plan Note (Signed)
The acute cough and wheezing may be secondary to a viral bronchitis. I will give him a trial of an albuterol inhaler and a 1-week course of prednisone. I will plan to see him back next week for reassessment.

## 2022-07-25 NOTE — Assessment & Plan Note (Signed)
Weight is stable in the clinic. No hypoxia. Appears compensated, though wheezing could be secondary to acute HF rather than a viral issue. I will check a BNP to assess for acute HF. Continue carvedilol 3.125 mg bid and Lasix.

## 2022-07-26 ENCOUNTER — Encounter: Payer: Self-pay | Admitting: Family Medicine

## 2022-07-26 ENCOUNTER — Telehealth: Payer: Self-pay | Admitting: Cardiology

## 2022-07-26 NOTE — Telephone Encounter (Signed)
Pt c/o medication issue:  1. Name of Medication:  furosemide (LASIX) 20 MG tablet  2. How are you currently taking this medication (dosage and times per day)?  As prescribed   3. Are you having a reaction (difficulty breathing--STAT)?   4. What is your medication issue?   Patient's daughter states patient saw PCP yesterday and they advised to get clarification on diuretic. She states patient recently had a cold and has been wheezing. She also mentions that he was put on inhaler.

## 2022-07-26 NOTE — Telephone Encounter (Signed)
Returned called spoke with patients daughter. Verified patients dose of Furosemide is 20 mg tablets and patient taking 2 tablets ( 40 mg) in the morning and taking 1 tablet (20 mg) afternoon daily. Daughter states that patient did have 4-5 lb weight increase last week but is back down 2 lbs this am. Patient was seen by his pcp yesterday and started on Prednisone ,Albuterol inhaler as needed and patient is feeling a little better today.  Instructed daughter to monitor patients weight and to call office if weight starts to go back up that we would get him in sooner that September which is when he is due to follow up.  Daughter thanked me for the call and had no other questions.

## 2022-07-26 NOTE — Telephone Encounter (Signed)
Daughter called back to add that the patient is 40 mg of furosemide (LASIX) 20 MG tablet  in the morning and 20 mg at lunch and has been taking these dosages since Dec 2023.

## 2022-07-31 ENCOUNTER — Other Ambulatory Visit: Payer: Self-pay | Admitting: Family Medicine

## 2022-07-31 DIAGNOSIS — D509 Iron deficiency anemia, unspecified: Secondary | ICD-10-CM

## 2022-08-01 ENCOUNTER — Ambulatory Visit: Payer: Medicare PPO | Admitting: Family Medicine

## 2022-08-01 VITALS — BP 120/74 | HR 53 | Temp 98.0°F | Ht 71.0 in | Wt 248.2 lb

## 2022-08-01 DIAGNOSIS — J40 Bronchitis, not specified as acute or chronic: Secondary | ICD-10-CM | POA: Diagnosis not present

## 2022-08-01 DIAGNOSIS — N1831 Chronic kidney disease, stage 3a: Secondary | ICD-10-CM | POA: Diagnosis not present

## 2022-08-01 DIAGNOSIS — E1122 Type 2 diabetes mellitus with diabetic chronic kidney disease: Secondary | ICD-10-CM

## 2022-08-01 DIAGNOSIS — I5032 Chronic diastolic (congestive) heart failure: Secondary | ICD-10-CM

## 2022-08-01 NOTE — Assessment & Plan Note (Signed)
A1c is at goal. Continue sitagliptin/metformin (Janumet XR) [phone] mg daily and metformin 500 mg q pm.

## 2022-08-01 NOTE — Assessment & Plan Note (Signed)
Improved. Lung exam clear today.

## 2022-08-01 NOTE — Progress Notes (Signed)
[facility] [address] [place] [address] Dept: [phone] Dept Fax: [phone]  Office Visit  Subjective:    Patient ID: Shawn Blanchard, male    DOB: 1936/02/08, 87 y.o..   MRN: [id number]  Chief Complaint  Patient presents with   Cough    Pt notes feeling much better this week, pt notes thinks this issue is resolved    History of Present Illness:  Patient is in today for reassessment of his recent respiratory illness. Last week, he complained of a 1-week history of sore throat, cough, and wheezing. He has a history of CAD, HFrEF, and chronic a. fib. He had not noted any increased swelling in his ankles, though his weight was increased mildly. I prescribed a course of prednisone. He notes that within about 24-hours his  breathing was doing much better. He feels he is back to his baseline at this point.  Past Medical History: Patient Active Problem List   Diagnosis Date Noted   Bronchitis 07/25/2022   Henoch-Schonlein purpura 01/11/2022   Ulcer of left foot with fat layer exposed    Disorder of sesamoid bone of foot    Gout of left foot    Diabetic foot infection 08/08/2021   Class 1 obesity 08/08/2021   Aortic atherosclerosis 06/22/2021   SBO (small bowel obstruction) 05/17/2021   Hypertensive urgency 05/17/2021   UTI (urinary tract infection) 05/17/2021   Normocytic anemia 05/17/2021   History of lymphoma 05/17/2021   Iron deficiency anemia 04/27/2021   History of squamous cell carcinoma in situ 04/18/2021   Statin intolerance 03/09/2021   Peritoneal adhesion 11/08/2020   Peripheral venous insufficiency 11/08/2020   Type 2 diabetes mellitus with stage 3a chronic kidney disease, without long-term current use of insulin 02/22/2020   Old MI (myocardial infarction)    Floppy eyelid syndrome of both eyes 10/05/2019   Sensorineural hearing loss (SNHL), bilateral 10/04/2019   Stage 3a chronic kidney disease  06/13/2019   Carpal tunnel syndrome of right wrist 03/25/2019   Diabetic polyneuropathy associated with type 2 diabetes mellitus 12/16/2018   Blepharitis of both upper and lower eyelid 10/02/2018   Meibomian gland dysfunction (MGD) of both eyes 10/02/2018   Essential hypertension 11/07/2017   Acute on chronic systolic CHF (congestive heart failure) 11/07/2017   Orthostatic dizziness 09/15/2017   Dermatochalasis of both upper eyelids 11/21/2016   Chronic anticoagulation 10/11/2016   Male erectile disorder 06/17/2016   Hyperlipidemia 04/11/2016   Small bowel obstruction 04/11/2016   BMI 35.0-35.9,adult 04/11/2016   Leukocytosis 04/11/2016   Arthritis 03/12/2016   Pseudophakia of both eyes 03/12/2016   Insomnia 11/27/2015   Ganglion cyst 11/27/2015   Chronic diastolic heart failure 09/01/2015   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/30/2015   Obstructive sleep apnea 08/30/2015   Pulmonary hypertension 08/30/2015   Restless leg syndrome 08/30/2015   Chronic atrial fibrillation 06/12/2015   Gastroesophageal reflux disease without esophagitis 06/12/2015   Idiopathic chronic gout 05/17/2015   Coronary artery disease involving native coronary artery of native heart with angina pectoris 02/15/2015   Hypertensive heart disease with heart failure 02/15/2015   Type 2 diabetes mellitus with peripheral neuropathy 12/02/2014   Past Surgical History:  Procedure Laterality Date   ABDOMINAL ADHESION SURGERY     x 2   BLEPHAROPLASTY Bilateral    CARPAL TUNNEL RELEASE Right 2021   CATARACT EXTRACTION Bilateral    CERVICAL SPINE SURGERY  CHOLECYSTECTOMY     CORONARY ANGIOPLASTY WITH STENT PLACEMENT  2000   IRRIGATION AND DEBRIDEMENT FOOT Left 08/11/2021   Procedure: IRRIGATION AND DEBRIDEMENT FOOT, AND BONE BIOPSY, PARTIAL SESMOIDECTOMY;  Surgeon: Edwin Cap, DPM;  Location: [facility];  Service: Podiatry;  Laterality: Left;   NASAL SINUS SURGERY     ORIF  TIBIAL SHAFT FRACTURE W/ PLATES AND SCREWS Left    PARTIAL KNEE ARTHROPLASTY Bilateral    SMALL INTESTINE SURGERY     Adenocarcinoma   TONSILLECTOMY     Family History  Problem Relation Age of Onset   Cancer Mother        Ovarian   Diabetes Mother    Heart disease Mother    Heart disease Father    Stroke Father    Diabetes Father    Diabetes Sister    Diabetes Brother    Diabetes Brother    Cancer Paternal Grandfather        Prostate   Colon cancer Neg Hx    Rectal cancer Neg Hx    Pancreatic cancer Neg Hx    Liver cancer Neg Hx    Stomach cancer Neg Hx    Outpatient Medications Prior to Visit  Medication Sig Dispense Refill   albuterol (VENTOLIN HFA) 108 (90 Base) MCG/ACT inhaler Inhale 2 puffs into the lungs every 6 (six) hours as needed for wheezing or shortness of breath. 8 g 2   allopurinol (ZYLOPRIM) 100 MG tablet Take 1 tablet (100 mg total) by mouth daily. 90 tablet 3   Alpha-Lipoic Acid 600 MG TABS Take 600 mg by mouth in the morning and at bedtime.     b complex vitamins capsule Take 1 capsule by mouth daily.     carvedilol (COREG) 3.125 MG tablet Take 1 tablet (3.125 mg total) by mouth 2 (two) times daily with a meal. 180 tablet 3   Cyanocobalamin (B-12) 1000 MCG SUBL Place 1,000 mg under the tongue.     dabigatran (PRADAXA) 150 MG CAPS capsule TAKE 1 CAPSULE BY MOUTH 2 TIMES DAILY 180 capsule 2   FEROSUL 325 (65 Fe) MG tablet TAKE 1 TABLET BY MOUTH 2 TIMES A DAY 60 tablet 2   furosemide (LASIX) 20 MG tablet Take 2 tablets (40 mg total) by mouth daily with breakfast AND 1 tablet (20 mg total) daily before lunch. 270 tablet 3   glucose blood test strip OneTouch Ultra Blue Test Strip     hydrocortisone 2.5 % ointment Apply 1 application topically 2 (two) times daily as needed (Skin Irritation).     lactobacillus acidophilus (BACID) TABS tablet Take 1 tablet by mouth 3 (three) times daily.     Lancets (ONETOUCH DELICA PLUS LANCET33G) MISC OneTouch Delica Plus Lancet 33  gauge     loperamide (IMODIUM A-D) 2 MG tablet Take 2 mg by mouth 4 (four) times daily as needed for diarrhea or loose stools.     Melatonin 5 MG CAPS Take 5 mg by mouth at bedtime.     metFORMIN (GLUCOPHAGE-XR) 500 MG 24 hr tablet Take 1 tablet (500 mg total) by mouth daily in the afternoon. 90 tablet 3   OVER THE COUNTER MEDICATION Take 25 mg by mouth daily. Balance of Nature: Fruits and Vegs supplements     polyvinyl alcohol (LIQUIFILM TEARS) 1.4 % ophthalmic solution Place 1 drop into both eyes as needed for dry eyes.     psyllium (METAMUCIL) 58.6 % packet Take 1 packet by mouth at bedtime. With a  full glass of water     REPATHA SURECLICK 140 MG/ML SOAJ INJECT  INTO THE SKIN EVERY 14 DAYS 2 mL 6   SitaGLIPtin-MetFORMIN HCl (JANUMET XR) [phone] MG TB24 TAKE 1 TABLET BY MOUTH EVERY DAY WITH BREAKFAST (FOR DIABETES) 90 tablet 0   Spacer/Aero-Holding Chambers DEVI Use with albuterol inhaler 1 each 0   predniSONE (DELTASONE) 20 MG tablet Take 1 tablet (20 mg total) by mouth daily with breakfast. (Patient not taking: Reported on 08/01/2022) 7 tablet 0   No facility-administered medications prior to visit.   Allergies  Allergen Reactions   Ace Inhibitors Rash   Amoxicillin Nausea Only   Atorvastatin Other (See Comments)    Dizziness.   Jardiance [Empagliflozin] Other (See Comments)    Orthostasis   Meperidine Rash   Naproxen Sodium Rash   Sulfa Antibiotics Nausea Only   Tetracycline Nausea Only     Objective:   Today's Vitals   08/01/22 0803  BP: 120/74  Pulse: (!) 53  Temp: 98 F (36.7 C)  TempSrc: Temporal  SpO2: 99%  Weight: 248 lb 3.2 oz (112.6 kg)  Height:  (1.803 m)   Body mass index is 34.62 kg/m.   General: Well developed, well nourished. No acute distress. Lungs: Clear to auscultation bilaterally. No wheezing, rales or rhonchi. Psych: Alert and oriented. Normal mood and affect.  There are no preventive care reminders to display for this patient.     Lab Results:  ProBNP (last 3 results) Recent Labs    03/18/22 1445 07/25/22 0922  PROBNP 609.0* 534.0*   Lab Results  Component Value Date   HGBA1C 5.9 07/25/2022     Assessment & Plan:   Problem List Items Addressed This Visit       Cardiovascular and Mediastinum   Chronic diastolic heart failure    Recent BNP was elevated, but improved form Dec. I had Mr. Yaklin contact Dr. Dulce Sellar who recommend we observe for now. His weight is down 2 lbs form last week. Plan to continue carvedilol 3.125 mg bid and Lasix 40 mg q am, 20 mg q noon.        Respiratory   Bronchitis - Primary    Improved. Lung exam clear today.        Endocrine   Type 2 diabetes mellitus with stage 3a chronic kidney disease, without long-term current use of insulin    A1c is at goal. Continue sitagliptin/metformin (Janumet XR) [phone] mg daily and metformin 500 mg q pm.        Return in about 3 months (around 10/31/2022) for Reassessment, fasting labs.   Loyola Mast, MD

## 2022-08-01 NOTE — Assessment & Plan Note (Addendum)
Recent BNP was elevated, but improved form Dec. I had Shawn Blanchard contact Dr. Dulce Sellar who recommend we observe for now. His weight is down 2 lbs form last week. Plan to continue carvedilol 3.125 mg bid and Lasix 40 mg q am, 20 mg q noon.

## 2022-08-15 ENCOUNTER — Other Ambulatory Visit: Payer: Self-pay | Admitting: Family Medicine

## 2022-08-15 ENCOUNTER — Ambulatory Visit: Payer: Medicare PPO | Admitting: Family Medicine

## 2022-08-15 ENCOUNTER — Encounter: Payer: Self-pay | Admitting: Family Medicine

## 2022-08-15 ENCOUNTER — Ambulatory Visit: Payer: Medicare PPO | Admitting: Podiatry

## 2022-08-15 VITALS — BP 134/78 | HR 55 | Temp 97.7°F | Wt 247.0 lb

## 2022-08-15 DIAGNOSIS — B351 Tinea unguium: Secondary | ICD-10-CM

## 2022-08-15 DIAGNOSIS — M79675 Pain in left toe(s): Secondary | ICD-10-CM

## 2022-08-15 DIAGNOSIS — N50811 Right testicular pain: Secondary | ICD-10-CM | POA: Diagnosis not present

## 2022-08-15 DIAGNOSIS — N5089 Other specified disorders of the male genital organs: Secondary | ICD-10-CM

## 2022-08-15 DIAGNOSIS — M79674 Pain in right toe(s): Secondary | ICD-10-CM | POA: Diagnosis not present

## 2022-08-15 DIAGNOSIS — E1140 Type 2 diabetes mellitus with diabetic neuropathy, unspecified: Secondary | ICD-10-CM

## 2022-08-15 DIAGNOSIS — N50812 Left testicular pain: Secondary | ICD-10-CM

## 2022-08-15 MED ORDER — HYDROCODONE-ACETAMINOPHEN 10-325 MG PO TABS
1.0000 | ORAL_TABLET | Freq: Three times a day (TID) | ORAL | 0 refills | Status: AC | PRN
Start: 2022-08-15 — End: 2022-08-20

## 2022-08-15 MED ORDER — TRAMADOL-ACETAMINOPHEN 37.5-325 MG PO TABS
1.0000 | ORAL_TABLET | Freq: Four times a day (QID) | ORAL | 0 refills | Status: DC | PRN
Start: 2022-08-15 — End: 2022-08-15

## 2022-08-15 MED ORDER — LEVOFLOXACIN 500 MG PO TABS
500.0000 mg | ORAL_TABLET | Freq: Every day | ORAL | 0 refills | Status: AC
Start: 2022-08-15 — End: 2022-08-25

## 2022-08-15 NOTE — Patient Instructions (Signed)
For testicular pain and swelling, we are treating for potential infection with antibiotic levofloxacin.  We are also ordering an ultrasound to look for causes like hernia, hydrocele/varicocele, testicular cancer, although these are felt to be less likely.  For severe symptoms of nausea, vomiting, diarrhea, fevers, chills, blood in the urine, abdominal pain, or any other worrisome symptom, please go to the emergency department.

## 2022-08-15 NOTE — Assessment & Plan Note (Signed)
The patient presents with bilateral testicular tenderness and swelling.  Patient is anticoagulated with dabigatran.  And therefore NSAIDs and tramadol are contraindicated due to increased risk of bleeding.  Differential Diagnosis:  Epididymitis/orchitis Testicular torsion Hernia Testicular cancer Varicocele  Plan:  Urinalysis with microscopic examination and reflex culture to rule out infection. Order scrotal ultrasound to evaluate for structural causes such as torsion or mass. Prescribe Levofloxacin 500 mg once daily for 10 days, to empirically treat potential infection. Hydrocodone/acetaminophen 10/325 1 tablet every 8 hours as needed pain Patient instruct patient to discontinue use of over-the-counter ibuprofen due to increased risk of bleeding management alternatives as directed. Follow-up on lab and imaging results to refine diagnostic assessment.

## 2022-08-15 NOTE — Progress Notes (Signed)
Assessment/Plan:   Problem List Items Addressed This Visit       Other   Pain in both testicles - Primary    The patient presents with bilateral testicular tenderness and swelling.  Patient is anticoagulated with dabigatran.  And therefore NSAIDs and tramadol are contraindicated due to increased risk of bleeding.  Differential Diagnosis:  Epididymitis/orchitis Testicular torsion Hernia Testicular cancer Varicocele  Plan:  Urinalysis with microscopic examination and reflex culture to rule out infection. Order scrotal ultrasound to evaluate for structural causes such as torsion or mass. Prescribe Levofloxacin 500 mg once daily for 10 days, to empirically treat potential infection. Hydrocodone/acetaminophen 10/325 1 tablet every 8 hours as needed pain Patient instruct patient to discontinue use of over-the-counter ibuprofen due to increased risk of bleeding management alternatives as directed. Follow-up on lab and imaging results to refine diagnostic assessment.      Relevant Medications   levofloxacin (LEVAQUIN) 500 MG tablet   HYDROcodone-acetaminophen (NORCO) 10-325 MG tablet   Other Relevant Orders   Urinalysis w microscopic + reflex cultur   US Scrotum   Other Visit Diagnoses     Scrotal swelling         Using Norco for pain as patient is at increased risk for using tramadol or NSAIDs given anticoagulation on dabigatran Patient cautioned to discontinue use OTC ibuprofen  Return if symptoms worsen or fail to improve, for Testicular pain.    Subjective:   Encounter date: 08/15/2022  Shawn Blanchard is a 87 y.o. male who has 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HCC); Arthritis; Chronic atrial fibrillation (HCC); Chronic diastolic heart failure (HCC); Coronary artery disease involving native coronary artery of native heart with angina pectoris (HCC); Type 2 diabetes mellitus with peripheral neuropathy (HCC); Hypertensive heart disease with heart failure (HCC);  Hyperlipidemia; Insomnia; 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HCC); Male erectile disorder; Obstructive sleep apnea; Pulmonary hypertension (HCC); Restless leg syndrome; Chronic anticoagulation; Small bowel obstruction (HCC); Orthostatic dizziness; BMI 35.0-35.9,adult; Leukocytosis; Gastroesophageal reflux disease without esophagitis; Idiopathic chronic gout; Ganglion cyst; Essential hypertension; Dermatochalasis of both upper eyelids; Pseudophakia of both eyes; Acute on chronic systolic CHF (congestive heart failure) (HCC); Blepharitis of both upper and lower eyelid; Carpal tunnel syndrome of right wrist; Diabetic polyneuropathy associated with type 2 diabetes mellitus (HCC); Meibomian gland dysfunction (MGD) of both eyes; Old MI (myocardial infarction); Type 2 diabetes mellitus with stage 3a chronic kidney disease, without long-term current use of insulin (HCC); Sensorineural hearing loss (SNHL), bilateral; Peritoneal adhesion; Floppy eyelid syndrome of both eyes; Stage 3a chronic kidney disease (HCC); Peripheral venous insufficiency; Statin intolerance; History of squamous cell carcinoma in situ; Iron deficiency anemia; SBO (small bowel obstruction) (HCC); Hypertensive urgency; UTI (urinary tract infection); Normocytic anemia; History of lymphoma; Aortic atherosclerosis (HCC); Diabetic foot infection (HCC); Class 1 obesity; Ulcer of left foot with fat layer exposed (HCC); Disorder of sesamoid bone of foot; Gout of left foot; Henoch-Schonlein purpura (HCC); Bronchitis; and Pain in both testicles on their problem list..   He  has a past medical history of Acute on chronic systolic CHF (congestive heart failure) (HCC) (11/07/2017), 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), Aortic atherosclerosis (HCC) (06/22/2021), Arthritis (03/12/2016), Blepharitis of both upper and lower eyelid (10/02/2018), BMI 35.0-35.9,adult (04/11/2016), Carpal tunnel syndrome of right wrist (03/25/2019), Chronic anticoagulation (10/11/2016),  Chronic atrial fibrillation (HCC) (06/12/2015), Chronic diastolic heart failure (HCC) (40/98/1191), Class 1 obesity (08/08/2021), Coronary artery disease involving native coronary artery of native heart with angina pectoris (HCC) (02/15/2015), Dermatochalasis of both upper eyelids (11/21/2016),  Diabetic foot infection (HCC) (08/08/2021), Diabetic polyneuropathy associated with type 2 diabetes mellitus (HCC) (12/16/2018), Disorder of sesamoid bone of foot, Essential hypertension (11/07/2017), 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), Ganglion cyst (11/27/2015), Gastroesophageal reflux disease without esophagitis (06/12/2015), Gout of left foot, History of lymphoma (05/17/2021), 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), Hyperlipidemia (04/11/2016), Hypertensive heart disease with heart failure (HCC) (02/15/2015), Hypertensive urgency (05/17/2021), Idiopathic chronic gout (05/17/2015), Insomnia (11/27/2015), Iron deficiency anemia (04/27/2021), Leukocytosis (04/11/2016), 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), Male erectile disorder (06/17/2016), Meibomian gland dysfunction (MGD) of both eyes (10/02/2018), Normocytic anemia (05/17/2021), Obstructive sleep apnea (08/30/2015), Old MI (myocardial infarction), Orthostatic dizziness (09/15/2017), Peripheral venous insufficiency (11/08/2020), Peritoneal adhesion (11/08/2020), Pseudophakia of both eyes (03/12/2016), Pulmonary hypertension (HCC) (08/30/2015), Restless leg syndrome (08/30/2015), SBO (small bowel obstruction) (HCC) (12/14/2019), Sensorineural hearing loss (SNHL), bilateral (10/04/2019), Small bowel obstruction (HCC) (04/11/2016), Stage 3a chronic kidney disease (HCC) (06/13/2019), Statin intolerance (03/09/2021), Type 2 diabetes mellitus with neurologic complication, without long-term current use of insulin (HCC) (12/02/2014), Type 2 diabetes mellitus with stage 3a chronic kidney disease, without long-term current use of insulin (HCC) (02/22/2020),  Ulcer of left foot with fat layer exposed (HCC), and UTI (urinary tract infection) (05/17/2021)..   Chief Complaint: Swelling and pain in the groin area, specifically located in both testicles.  History of Present Illness:  Swelling and Pain: The patient presents with swelling and pain in the groin area, localized to the testes. The onset was approximately two weeks prior to the visit. Initially, the discomfort was not as intense but escalated, leading the patient to take ibuprofen (400 mg every 8 hours), which provided temporary relief but did not resolve the issue. The pain and swelling have persisted, prompting him to seek medical attention. No prior episodes of similar symptoms were reported.  Review of Systems  Constitutional:  Negative for chills and fever.  Gastrointestinal:  Positive for diarrhea (Chronic, unchanging). Negative for blood in stool, constipation, melena, nausea and vomiting.  Genitourinary:  Positive for hematuria (Patient reports that urine appears orange, but not bloody). Negative for dysuria, flank pain, frequency and urgency.       Testicular pain and swelling    Past Surgical History:  Procedure Laterality Date   ABDOMINAL ADHESION SURGERY     x 2   BLEPHAROPLASTY Bilateral    CARPAL TUNNEL RELEASE Right 2021   CATARACT EXTRACTION Bilateral    CERVICAL SPINE SURGERY     CHOLECYSTECTOMY     CORONARY ANGIOPLASTY WITH STENT PLACEMENT  2000   IRRIGATION AND DEBRIDEMENT FOOT Left 08/11/2021   Procedure: IRRIGATION AND DEBRIDEMENT FOOT, AND BONE BIOPSY, PARTIAL SESMOIDECTOMY;  Surgeon: Edwin Cap, DPM;  Location: [facility];  Service: Podiatry;  Laterality: Left;   NASAL SINUS SURGERY     ORIF TIBIAL SHAFT FRACTURE W/ PLATES AND SCREWS Left    PARTIAL KNEE ARTHROPLASTY Bilateral    SMALL INTESTINE SURGERY     Adenocarcinoma   TONSILLECTOMY      Outpatient Medications Prior to Visit  Medication Sig Dispense Refill   allopurinol (ZYLOPRIM) 100 MG tablet Take 1  tablet (100 mg total) by mouth daily. 90 tablet 3   Alpha-Lipoic Acid 600 MG TABS Take 600 mg by mouth in the morning and at bedtime.     b complex vitamins capsule Take 1 capsule by mouth daily.     carvedilol (COREG) 3.125 MG tablet Take 1 tablet (3.125 mg total) by mouth 2 (two) times daily with a meal. 180 tablet  3   Cyanocobalamin (B-12) 1000 MCG SUBL Place 1,000 mg under the tongue.     dabigatran (PRADAXA) 150 MG CAPS capsule TAKE 1 CAPSULE BY MOUTH 2 TIMES DAILY 180 capsule 2   FEROSUL 325 (65 Fe) MG tablet TAKE 1 TABLET BY MOUTH 2 TIMES A DAY 60 tablet 2   furosemide (LASIX) 20 MG tablet Take 2 tablets (40 mg total) by mouth daily with breakfast AND 1 tablet (20 mg total) daily before lunch. 270 tablet 3   glucose blood test strip OneTouch Ultra Blue Test Strip     hydrocortisone 2.5 % ointment Apply 1 application topically 2 (two) times daily as needed (Skin Irritation).     lactobacillus acidophilus (BACID) TABS tablet Take 1 tablet by mouth 3 (three) times daily.     Lancets (ONETOUCH DELICA PLUS LANCET33G) MISC OneTouch Delica Plus Lancet 33 gauge     loperamide (IMODIUM A-D) 2 MG tablet Take 2 mg by mouth 4 (four) times daily as needed for diarrhea or loose stools.     Melatonin 5 MG CAPS Take 5 mg by mouth at bedtime.     metFORMIN (GLUCOPHAGE-XR) 500 MG 24 hr tablet Take 1 tablet (500 mg total) by mouth daily in the afternoon. 90 tablet 3   OVER THE COUNTER MEDICATION Take 25 mg by mouth daily. Balance of Nature: Fruits and Vegs supplements     polyvinyl alcohol (LIQUIFILM TEARS) 1.4 % ophthalmic solution Place 1 drop into both eyes as needed for dry eyes.     psyllium (METAMUCIL) 58.6 % packet Take 1 packet by mouth at bedtime. With a full glass of water     REPATHA SURECLICK 140 MG/ML SOAJ INJECT 140MG  INTO THE SKIN EVERY 14 DAYS 2 mL 6   albuterol (VENTOLIN HFA) 108 (90 Base) MCG/ACT inhaler Inhale 2 puffs into the lungs every 6 (six) hours as needed for wheezing or shortness of  breath. (Patient not taking: Reported on 08/15/2022) 8 g 2   predniSONE (DELTASONE) 20 MG tablet Take 1 tablet (20 mg total) by mouth daily with breakfast. (Patient not taking: Reported on 08/01/2022) 7 tablet 0   SitaGLIPtin-MetFORMIN HCl (JANUMET XR) [phone] MG TB24 TAKE 1 TABLET BY MOUTH EVERY DAY WITH BREAKFAST (FOR DIABETES) (Patient not taking: Reported on 08/15/2022) 90 tablet 0   Spacer/Aero-Holding Chambers DEVI Use with albuterol inhaler (Patient not taking: Reported on 08/15/2022) 1 each 0   No facility-administered medications prior to visit.    Family History  Problem Relation Age of Onset   Cancer Mother        Ovarian   Diabetes Mother    Heart disease Mother    Heart disease Father    Stroke Father    Diabetes Father    Diabetes Sister    Diabetes Brother    Diabetes Brother    Cancer Paternal Grandfather        Prostate   Colon cancer Neg Hx    Rectal cancer Neg Hx    Pancreatic cancer Neg Hx    Liver cancer Neg Hx    Stomach cancer Neg Hx     Social History   Socioeconomic History   Marital status: Married    Spouse name: Not on file   Number of children: 2   Years of education: Not on file   Highest education level: Not on file  Occupational History   Not on file  Tobacco Use   Smoking status: Never   Smokeless tobacco: Never  Vaping Use   Vaping Use: Never used  Substance and Sexual Activity   Alcohol use: Not Currently   Drug use: Never   Sexual activity: Yes  Other Topics Concern   Not on file  Social History Narrative   Former high school principal.   Social Determinants of Health   Financial Resource Strain: Low Risk  (06/12/2022)   Overall Financial Resource Strain (CARDIA)    Difficulty of Paying Living Expenses: Not hard at all  Food Insecurity: No Food Insecurity (03/19/2022)   Hunger Vital Sign    Worried About Running Out of Food in the Last Year: Never true    Ran Out of Food in the Last Year: Never true  Transportation Needs: No  Transportation Needs (06/12/2022)   PRAPARE - Administrator, Civil Service (Medical): No    Lack of Transportation (Non-Medical): No  Physical Activity: Inactive (03/19/2022)   Exercise Vital Sign    Days of Exercise per Week: 0 days    Minutes of Exercise per Session: 0 min  Stress: No Stress Concern Present (03/19/2022)   [employer] of Occupational Health - Occupational Stress Questionnaire    Feeling of Stress : Not at all  Social Connections: Socially Integrated (03/07/2021)   Social Connection and Isolation Panel [NHANES]    Frequency of Communication with Friends and Family: Twice a week    Frequency of Social Gatherings with Friends and Family: Twice a week    Attends Religious Services: More than 4 times per year    Active Member of [employer] or Organizations: Yes    Attends Banker Meetings: More than 4 times per year    Marital Status: Married  Catering manager Violence: Not At Risk (03/07/2021)   Humiliation, Afraid, Rape, and Kick questionnaire    Fear of Current or Ex-Partner: No    Emotionally Abused: No    Physically Abused: No    Sexually Abused: No                                                                                                  Objective:  Physical Exam: BP 134/78 (BP Location: Left Arm, Patient Position: Sitting, Cuff Size: Large)   Pulse (!) 55   Temp 97.7 F (36.5 C) (Temporal)   Wt 247 lb (112 kg)   SpO2 100%   BMI 34.45 kg/m     Physical Exam Exam conducted with a chaperone present Dorena Cookey).  Constitutional:      General: He is not in acute distress. Abdominal:     General: Abdomen is flat. There is no distension.     Palpations: Abdomen is soft.     Tenderness: There is no abdominal tenderness.     Hernia: There is no hernia in the left inguinal area, right femoral area or right inguinal area (No hernias appreciated on exam).  Genitourinary:    Penis: Uncircumcised. No lesions.      Testes:         Right: Tenderness and swelling present.        Left: Tenderness and  swelling present.     Comments: Intertriginous dermatitis with erythema and scaling located between testicles and thighs and perineum.  No tenderness to perineum, skin scaling Neurological:     General: No focal deficit present.     Mental Status: He is alert and oriented to person, place, and time.     Gait: Gait abnormal (Uses a walker).  Psychiatric:        Mood and Affect: Mood normal.     No results found.  Recent Results (from the past 2160 hour(s))  Hemoglobin A1c     Status: None   Collection Time: 07/25/22  9:22 AM  Result Value Ref Range   Hgb A1c MFr Bld 5.9 4.6 - 6.5 %    Comment: Glycemic Control Guidelines for People with Diabetes:Non Diabetic:  <6%Goal of Therapy: <7%Additional Action Suggested:  >8%   Glucose, random     Status: Abnormal   Collection Time: 07/25/22  9:22 AM  Result Value Ref Range   Glucose, Bld 103 (H) 70 - 99 mg/dL  Brain natriuretic peptide     Status: Abnormal   Collection Time: 07/25/22  9:22 AM  Result Value Ref Range   Pro B Natriuretic peptide (BNP) 534.0 (H) 0.0 - 100.0 pg/mL        Garner Nash, MD, MS

## 2022-08-16 LAB — URINALYSIS W MICROSCOPIC + REFLEX CULTURE
Bilirubin Urine: NEGATIVE
Hyaline Cast: NONE SEEN /LPF
Nitrites, Initial: NEGATIVE
Protein, ur: NEGATIVE

## 2022-08-16 LAB — CULTURE INDICATED

## 2022-08-18 ENCOUNTER — Encounter: Payer: Self-pay | Admitting: Family Medicine

## 2022-08-19 NOTE — Progress Notes (Signed)
  Subjective:  Patient ID: Shawn Blanchard, male    DOB: [birth year],  MRN: [id number]  Chief Complaint  Patient presents with   Nail Problem    Patient states bilateral toenails are long, thick, painful, and difficult to trim on his own. No other concerns voiced from patient or support person.     87 y.o. male presents with the above complaint. History confirmed with patient.  Returns for at risk foot care.  Regular debridement of the nails has been helpful.  Still wearing compression stockings, he wonders if there is something better to wear during the summer  Objective:  Physical Exam: warm, good capillary refill, no trophic changes or ulcerative lesions, venous stasis dermatitis noted, and varicose veins noted.  Pulses are weakly palpable.  Thickened elongated nails x10 with yellow-brown discoloration and subungual debris.  No recurrence of ulceration or preulcerative callus Assessment:   1. Pain due to onychomycosis of toenails of both feet      Plan:  Patient was evaluated and treated and all questions answered.  Discussed the etiology and treatment options for the condition in detail with the patient. Educated patient on the topical and oral treatment options for mycotic nails. Recommended debridement of the nails today. Sharp and mechanical debridement performed of all painful and mycotic nails today. Nails debrided in length and thickness using a nail nipper to level of comfort. Discussed treatment options including appropriate shoe gear. Follow up as needed for painful nails.  Do think he should continue to wear his compression therapy.  We discussed the option of a CircAid type wrap compression garment but not sure this will be much more comfortable during the summer months but would be easier to put on.  They will consider this.  Return in about 3 months (around 11/15/2022) for at risk diabetic foot care.

## 2022-08-19 NOTE — Telephone Encounter (Signed)
Advised Shawn Blanchard of Dr. Carollee Massed annotation below and she verbalized understanding. Stated that pt is doing fine now since he stopped the antibiotic and she will continue to monitor him and if he begins to show signs of dizziness or blacking out she will take him to ED.

## 2022-08-21 LAB — URINE CULTURE

## 2022-08-21 LAB — URINALYSIS W MICROSCOPIC + REFLEX CULTURE
Bacteria, UA: NONE SEEN /HPF
Glucose, UA: NEGATIVE
Ketones, ur: NEGATIVE
Specific Gravity, Urine: 1.01 (ref 1.001–1.035)
pH: 5.5 (ref 5.0–8.0)

## 2022-08-22 ENCOUNTER — Ambulatory Visit (HOSPITAL_BASED_OUTPATIENT_CLINIC_OR_DEPARTMENT_OTHER)
Admission: RE | Admit: 2022-08-22 | Discharge: 2022-08-22 | Disposition: A | Discharge status: Home / Self Care | Payer: Medicare PPO | Source: Ambulatory Visit | Attending: Family Medicine | Admitting: Family Medicine

## 2022-08-22 DIAGNOSIS — N50811 Right testicular pain: Secondary | ICD-10-CM | POA: Diagnosis not present

## 2022-08-22 DIAGNOSIS — N433 Hydrocele, unspecified: Secondary | ICD-10-CM | POA: Diagnosis not present

## 2022-08-22 DIAGNOSIS — N50812 Left testicular pain: Secondary | ICD-10-CM

## 2022-08-27 ENCOUNTER — Telehealth: Payer: Self-pay

## 2022-08-27 DIAGNOSIS — N5089 Other specified disorders of the male genital organs: Secondary | ICD-10-CM

## 2022-08-27 NOTE — Telephone Encounter (Signed)
Spoke to [place], patient's daughter, he had to stop the Levofloxacin after 3 days due to got light headed and passed out.  Was advised by Dr Janee Morn that was okay to stop it.   Is there another antibiotic he can take?  Please review and advise.  Thanks. Dm/cma

## 2022-08-27 NOTE — Telephone Encounter (Signed)
Radiology report received from Jane at [facility] for an scrotal ultrasound doppler performed on this patient on 08/22/22. Report in pt chart. Urology referral recommended.

## 2022-08-28 ENCOUNTER — Other Ambulatory Visit: Payer: Self-pay | Admitting: Cardiology

## 2022-08-28 DIAGNOSIS — E782 Mixed hyperlipidemia: Secondary | ICD-10-CM

## 2022-08-28 DIAGNOSIS — G4733 Obstructive sleep apnea (adult) (pediatric): Secondary | ICD-10-CM | POA: Diagnosis not present

## 2022-08-28 DIAGNOSIS — R0689 Other abnormalities of breathing: Secondary | ICD-10-CM | POA: Diagnosis not present

## 2022-08-28 NOTE — Telephone Encounter (Signed)
Spoke to [place], patient's daughter, she has already scheduled an appointment for 08/29/22 @ 10:00 am. Dm/cma

## 2022-08-29 ENCOUNTER — Encounter: Payer: Self-pay | Admitting: Family Medicine

## 2022-08-29 ENCOUNTER — Ambulatory Visit: Payer: Medicare PPO | Admitting: Family Medicine

## 2022-08-29 VITALS — BP 124/70 | HR 69 | Temp 98.0°F | Ht 71.0 in | Wt 250.0 lb

## 2022-08-29 DIAGNOSIS — N50811 Right testicular pain: Secondary | ICD-10-CM | POA: Diagnosis not present

## 2022-08-29 DIAGNOSIS — N433 Hydrocele, unspecified: Secondary | ICD-10-CM

## 2022-08-29 DIAGNOSIS — N50812 Left testicular pain: Secondary | ICD-10-CM

## 2022-08-29 DIAGNOSIS — M545 Low back pain, unspecified: Secondary | ICD-10-CM | POA: Diagnosis not present

## 2022-08-29 NOTE — Assessment & Plan Note (Signed)
Hydrocele's seen on ultrasound. This is likely the main cause of scrotal enlargement. Discussed that these are typically benign. He has an upcoming appointment with urology, for which he should discuss this.

## 2022-08-29 NOTE — Progress Notes (Signed)
[facility] [address] [place] [address] Dept: [phone] Dept Fax: [phone]  Office Visit  Subjective:    Patient ID: Shawn Blanchard, male    DOB: [birth year], 87 y.o..   MRN: [id number]  Chief Complaint  Patient presents with   Medical Management of Chronic Issues    C/o having leg swelling x 2 days and still having back pain.  Has been using Tylenol and Advil.      History of Present Illness:  Patient is in today for reassessment of an issue he had recently with scrotal swelling and pain. He was seen on 5/9 by Dr. Janee Morn. He was treated with levofloxacin 500 mg daily. After 4 days, Shawn Blanchard developed some lightheadedness and near syncope, which he related to the antibiotic, so he stopped this. He did have an ultrasound performed last week. he feels his issue is resolved/improved today.  Shawn Blanchard has also had a 3 week history of lower back pain. He does not recall an injury. He is not running fever. He has no radiation of the pain. He has had no loss of bowel or bladder continence. He denies any new areas of numbness or tingling in the legs. He daughter feels his right leg may be more swollen that usual.  Past Medical History: Patient Active Problem List   Diagnosis Date Noted   Hydrocele 08/29/2022   Pain in both testicles 08/15/2022   Bronchitis 07/25/2022   Henoch-Schonlein purpura (HCC) 01/11/2022   Ulcer of left foot with fat layer exposed (HCC)    Disorder of sesamoid bone of foot    Gout of left foot    Diabetic foot infection (HCC) 08/08/2021   Class 1 obesity 08/08/2021   Aortic atherosclerosis (HCC) 06/22/2021   SBO (small bowel obstruction) (HCC) 05/17/2021   Hypertensive urgency 05/17/2021   UTI (urinary tract infection) 05/17/2021   Normocytic anemia 05/17/2021   History of lymphoma 05/17/2021   Iron deficiency anemia 04/27/2021   History of squamous cell carcinoma in situ  04/18/2021   Statin intolerance 03/09/2021   Peritoneal adhesion 11/08/2020   Peripheral venous insufficiency 11/08/2020   Type 2 diabetes mellitus with stage 3a chronic kidney disease, without long-term current use of insulin (HCC) 02/22/2020   Old MI (myocardial infarction)    Floppy eyelid syndrome of both eyes 10/05/2019   Sensorineural hearing loss (SNHL), bilateral 10/04/2019   Stage 3a chronic kidney disease (HCC) 06/13/2019   Carpal tunnel syndrome of right wrist 03/25/2019   Diabetic polyneuropathy associated with type 2 diabetes mellitus (HCC) 12/16/2018   Blepharitis of both upper and lower eyelid 10/02/2018   Meibomian gland dysfunction (MGD) of both eyes 10/02/2018   Essential hypertension 11/07/2017   Acute on chronic systolic CHF (congestive heart failure) (HCC) 11/07/2017   Orthostatic dizziness 09/15/2017   Dermatochalasis of both upper eyelids 11/21/2016   Chronic anticoagulation 10/11/2016   Male erectile disorder 06/17/2016   Hyperlipidemia 04/11/2016   Small bowel obstruction (HCC) 04/11/2016   BMI 35.0-35.9,adult 04/11/2016   Leukocytosis 04/11/2016   Arthritis 03/12/2016   Pseudophakia of both eyes 03/12/2016   Insomnia 11/27/2015   Ganglion cyst 11/27/2015   Chronic diastolic heart failure (HCC) 09/01/2015   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   Obstructive sleep apnea 08/30/2015   Pulmonary hypertension (HCC) 08/30/2015   Restless leg syndrome 08/30/2015   Chronic atrial fibrillation (HCC) 06/12/2015   Gastroesophageal reflux disease without  esophagitis 06/12/2015   Idiopathic chronic gout 05/17/2015   Coronary artery disease involving native coronary artery of native heart with angina pectoris (HCC) 02/15/2015   Hypertensive heart disease with heart failure (HCC) 02/15/2015   Type 2 diabetes mellitus with peripheral neuropathy (HCC) 12/02/2014   Past Surgical History:  Procedure Laterality  Date   ABDOMINAL ADHESION SURGERY     x 2   BLEPHAROPLASTY Bilateral    CARPAL TUNNEL RELEASE Right 2021   CATARACT EXTRACTION Bilateral    CERVICAL SPINE SURGERY     CHOLECYSTECTOMY     CORONARY ANGIOPLASTY WITH STENT PLACEMENT  2000   IRRIGATION AND DEBRIDEMENT FOOT Left 08/11/2021   Procedure: IRRIGATION AND DEBRIDEMENT FOOT, AND BONE BIOPSY, PARTIAL SESMOIDECTOMY;  Surgeon: Edwin Cap, DPM;  Location: [facility];  Service: Podiatry;  Laterality: Left;   NASAL SINUS SURGERY     ORIF TIBIAL SHAFT FRACTURE W/ PLATES AND SCREWS Left    PARTIAL KNEE ARTHROPLASTY Bilateral    SMALL INTESTINE SURGERY     Adenocarcinoma   TONSILLECTOMY     Family History  Problem Relation Age of Onset   Cancer Mother        Ovarian   Diabetes Mother    Heart disease Mother    Heart disease Father    Stroke Father    Diabetes Father    Diabetes Sister    Diabetes Brother    Diabetes Brother    Cancer Paternal Grandfather        Prostate   Colon cancer Neg Hx    Rectal cancer Neg Hx    Pancreatic cancer Neg Hx    Liver cancer Neg Hx    Stomach cancer Neg Hx    Outpatient Medications Prior to Visit  Medication Sig Dispense Refill   albuterol (VENTOLIN HFA) 108 (90 Base) MCG/ACT inhaler Inhale 2 puffs into the lungs every 6 (six) hours as needed for wheezing or shortness of breath. 8 g 2   allopurinol (ZYLOPRIM) 100 MG tablet Take 1 tablet (100 mg total) by mouth daily. 90 tablet 3   Alpha-Lipoic Acid 600 MG TABS Take 600 mg by mouth in the morning and at bedtime.     b complex vitamins capsule Take 1 capsule by mouth daily.     carvedilol (COREG) 3.125 MG tablet Take 1 tablet (3.125 mg total) by mouth 2 (two) times daily with a meal. 180 tablet 3   Cyanocobalamin (B-12) 1000 MCG SUBL Place 1,000 mg under the tongue.     dabigatran (PRADAXA) 150 MG CAPS capsule TAKE 1 CAPSULE BY MOUTH 2 TIMES DAILY 180 capsule 2   FEROSUL 325 (65 Fe) MG tablet TAKE 1 TABLET BY MOUTH 2 TIMES A DAY 60 tablet 2    furosemide (LASIX) 20 MG tablet Take 2 tablets (40 mg total) by mouth daily with breakfast AND 1 tablet (20 mg total) daily before lunch. 270 tablet 3   glucose blood test strip OneTouch Ultra Blue Test Strip     hydrocortisone 2.5 % ointment Apply 1 application topically 2 (two) times daily as needed (Skin Irritation).     lactobacillus acidophilus (BACID) TABS tablet Take 1 tablet by mouth 3 (three) times daily.     Lancets (ONETOUCH DELICA PLUS LANCET33G) MISC OneTouch Delica Plus Lancet 33 gauge     loperamide (IMODIUM A-D) 2 MG tablet Take 2 mg by mouth 4 (four) times daily as needed for diarrhea or loose stools.     Melatonin 5 MG CAPS  Take 5 mg by mouth at bedtime.     metFORMIN (GLUCOPHAGE-XR) 500 MG 24 hr tablet TAKE 2 TABLETS BY MOUTH EVERY DAY WITH DINNER (DIABETES) 180 tablet 1   OVER THE COUNTER MEDICATION Take 25 mg by mouth daily. Balance of Nature: Fruits and Vegs supplements     polyvinyl alcohol (LIQUIFILM TEARS) 1.4 % ophthalmic solution Place 1 drop into both eyes as needed for dry eyes.     psyllium (METAMUCIL) 58.6 % packet Take 1 packet by mouth at bedtime. With a full glass of water     REPATHA SURECLICK 140 MG/ML SOAJ INJECT 140 MG INTO THE SKIN EVERY 14 DAYS 2 mL 6   SitaGLIPtin-MetFORMIN HCl (JANUMET XR) 440 271 1226 MG TB24 TAKE 1 TABLET BY MOUTH EVERY DAY WITH BREAKFAST (FOR DIABETES) 90 tablet 0   Spacer/Aero-Holding Chambers DEVI Use with albuterol inhaler 1 each 0   traMADol-acetaminophen (ULTRACET) 37.5-325 MG tablet Take by mouth.     predniSONE (DELTASONE) 20 MG tablet Take 1 tablet (20 mg total) by mouth daily with breakfast. (Patient not taking: Reported on 08/01/2022) 7 tablet 0   No facility-administered medications prior to visit.   Allergies  Allergen Reactions   Ace Inhibitors Rash   Amoxicillin Nausea Only   Atorvastatin Other (See Comments)    Dizziness.   Jardiance [Empagliflozin] Other (See Comments)    Orthostasis   Meperidine Rash   Naproxen  Sodium Rash   Sulfa Antibiotics Nausea Only   Tetracycline Nausea Only     Objective:   Today's Vitals   08/29/22 0953  BP: 124/70  Pulse: 69  Temp: 98 F (36.7 C)  TempSrc: Temporal  SpO2: 99%  Weight: 250 lb (113.4 kg)  Height: 5\' 11"  (1.803 m)   Body mass index is 34.87 kg/m.   General: Well developed, well nourished. No acute distress. Back: Pain not present today. It appears to be a band-like pain in the lower lumbar area, more on the left side. GU: Scrotum is enlarged. Testicles feel mildly enlarged and remain mildly tender to palpation. No masses are palpable. Extremities:  Psych: Alert and oriented. Normal mood and affect.  Health Maintenance Due  Topic Date Due   Diabetic kidney evaluation - Urine ACR  09/25/2022   Imaging: Ultrasound of Scrotum (08/22/2022) IMPRESSION: 1. No evidence of testicular mass or torsion. 2. Bilateral hydrocele. 3. Complex soft tissue abnormality in the scrotum and adjacent to the right testis which may be larger than measured. This appears to push on the inferior aspect of the right testicle and although neoplasm cannot be entirely excluded, this has the appearance more likely of either an infectious/inflammatory process or hematoma. Urologic referral recommended.  Lab Results    Component Ref Range & Units 2 wk ago (08/15/22)  Color, Urine YELLOW YELLOW  APPearance CLEAR CLEAR  Specific Gravity, Urine 1.001 - 1.035 1.010  pH 5.0 - 8.0 5.5  Glucose, UA NEGATIVE NEGATIVE  Bilirubin Urine NEGATIVE NEGATIVE  Ketones, ur NEGATIVE NEGATIVE  Hgb urine dipstick NEGATIVE 2+ Abnormal   Protein, ur NEGATIVE NEGATIVE  Nitrites, Initial NEGATIVE NEGATIVE  Leukocyte Esterase NEGATIVE 2+ Abnormal   WBC, UA 0 - 5 /HPF 20-40 Abnormal   RBC / HPF 0 - 2 /HPF 10-20 Abnormal   Squamous Epithelial / HPF < OR = 5 /HPF 0-5  Bacteria, UA NONE SEEN /HPF NONE SEEN  Hyaline Cast NONE SEEN /LPF NONE SEEN     Assessment & Plan:    Problem List Items Addressed This  Visit       Genitourinary   Hydrocele    Hydrocele's seen on ultrasound. This is likely the main cause of scrotal enlargement. Discussed that these are typically benign. He has an upcoming appointment with urology, for which he should discuss this.        Other   Pain in both testicles - Primary    Testicles feel mildly swollen and are mildly tender. He had only a partial course of levofloxacin. He is allergic to sulfa meds. At this point, since he feels his symptoms are improved/resolved, I will hold off on further antibiotics. Recommend he keep appointment with urology.      Other Visit Diagnoses     Acute left-sided low back pain without sciatica       Appears to be muscular in origin. We discussed OTC topical approaches for comfort. Follow-up if any red flag symptoms or if pain persists past the next 3 weeks.   Relevant Medications   traMADol-acetaminophen (ULTRACET) 37.5-325 MG tablet       Return if symptoms worsen or fail to improve.   Loyola Mast, MD

## 2022-08-29 NOTE — Telephone Encounter (Signed)
Pt daughter advised on separate encounter on 08/19/22.     08/19/22  4:52 PM Note Advised [name] of Dr. Carollee Massed annotation below and she verbalized understanding. Stated that pt is doing fine now since he stopped the antibiotic and she will continue to monitor him and if he begins to show signs of dizziness or blacking out she will take him to ED.

## 2022-08-29 NOTE — Assessment & Plan Note (Signed)
Testicles feel mildly swollen and are mildly tender. He had only a partial course of levofloxacin. He is allergic to sulfa meds. At this point, since he feels his symptoms are improved/resolved, I will hold off on further antibiotics. Recommend he keep appointment with urology.

## 2022-09-04 ENCOUNTER — Other Ambulatory Visit: Payer: Self-pay | Admitting: Family Medicine

## 2022-09-05 ENCOUNTER — Ambulatory Visit: Payer: Medicare PPO

## 2022-09-05 DIAGNOSIS — I5032 Chronic diastolic (congestive) heart failure: Secondary | ICD-10-CM

## 2022-09-05 DIAGNOSIS — E1122 Type 2 diabetes mellitus with diabetic chronic kidney disease: Secondary | ICD-10-CM

## 2022-09-05 NOTE — Progress Notes (Signed)
Care Management & Coordination Services Pharmacy Note  09/05/2022 Name:  Shawn Blanchard MRN:  [id number] DOB:  1935-06-02  Summary: Patient presents for follow-up pharmacy consult.    -Given history of ASCVD and CKD recommended switching regimen to a GLP-1 agonist or SGLT2 inhibitor. Patient was potentially agreeable, but preferred to keep things the same since his blood sugars were doing so well.   -Patient reports frequent bruising, but denies any serious/unusual bleeding. DDI with Carvedilol + Dabigatran increasing bleeding risk. Discussed potentially switching to alternate anticoagulant such as Eliquis. Patient will discuss at next Cardiology follow-up.   Recommendations/Changes made from today's visit: -Continue current medications  Discussed potentially switching to alternate anticoagulant such as Eliquis. Patient will discuss at next Cardiology follow-up in September.   Follow up plan: CPP follow-up 6 months   Subjective: Shawn Blanchard is an 86 y.o. year old male who is a primary patient of [name], Bertram Millard, MD.  The care coordination team was consulted for assistance with disease management and care coordination needs.    Engaged with patient face to face for initial visit.  Recent office visits: 08/29/22: Patient presented to Dr. Veto Kemps for testicular pain.  08/15/22: Patient presented to Dr. Janee Morn for testicular pain.  08/01/22: Patient presented to Dr. Veto Kemps for bronchitis.  07/25/22: Patient presented to Dr. Veto Kemps for follow-up.  04/26/2022 Dr. Veto Kemps MD (PCP) No Medication Changes noted,Lab work completed - (A1C- 6.0)  return in 3 months 03/19/2022 Elisha Ponder LPN (PCP Office) Medicare Wellness completed, no medication changes noted 03/18/2022 Dr. Veto Kemps MD (PCP) Increase his Lasix to 40 mg each morning and continue 20 mg each evening, Ambulatory referral to Cardiology   01/11/2022 Dr. Veto Kemps MD (PCP) No Medication changes noted, Ambulatory referral to Pulmonology, Return  in about 3 months  Recent consult visits: 05/09/2022 (Pulmonary) No Medication changes noted, No orders placed, Follow-up in 6 months  05/08/2022 Dr. Isaias Cowman MD (Ophthalmology) No Medication changes noted 05/02/2022 Sharl Ma DPM (Podiatry) No Medication Changes noted 03/28/2022 Dr. Dulce Sellar MD (Cardiology) Start Nitroglycerin 0.4 mg PRN, No orders placed. 02/08/2022 Dr. Wynona Neat MD (Pulmonary) No Medication changes noted, DME referral for CPAP supplies, Follow-up in about 3 months  02/05/2022 Elmon Else PA (Dermatology) Start Hydrocortisone 2.5% PRN 01/24/2022 Sharl Ma DPM (Podiatry) No Medication Changes noted 01/10/2022 Dr. Janit Pagan MD (Nephrology) No Medications changes noted, return in 4 months  Hospital visits: None in previous 6 months   Objective:  Lab Results  Component Value Date   CREATININE 1.42 05/01/2022   BUN 27 (H) 05/01/2022   GFR 45.45 (L) 05/01/2022   GFRNONAA >60 08/12/2021   GFRAA >60 12/17/2019   NA 143 05/01/2022   K 4.5 05/01/2022   CALCIUM 9.4 05/01/2022   CO2 27 05/01/2022   GLUCOSE 103 (H) 07/25/2022    Lab Results  Component Value Date/Time   HGBA1C 5.9 07/25/2022 09:22 AM   HGBA1C 6.0 04/26/2022 08:52 AM   GFR 45.45 (L) 05/01/2022 09:02 AM   GFR 43.63 (L) 03/18/2022 02:45 PM   MICROALBUR 4.3 (H) 09/24/2021 03:02 PM   MICROALBUR 0.9 11/09/2020 07:57 AM    Last diabetic Eye exam:  Lab Results  Component Value Date/Time   HMDIABEYEEXA No Retinopathy 05/08/2022 12:00 AM    Last diabetic Foot exam: No results found for: "HMDIABFOOTEX"   Lab Results  Component Value Date   CHOL 63 06/22/2021   HDL 34.10 (L) 06/22/2021   LDLCALC 13 06/22/2021   TRIG 79.0 06/22/2021   CHOLHDL 2  06/22/2021       Latest Ref Rng & Units 09/25/2021    3:18 PM 08/08/2021    9:41 AM 05/16/2021    9:59 PM  Hepatic Function  Total Protein 6.0 - 8.3 g/dL 7.2  7.1  7.6   Albumin 3.5 - 5.2 g/dL 3.8  4.1  4.5   AST 0 - 37 U/L 21  15  16    ALT 0 - 53 U/L 13  12   11    Alk Phosphatase 39 - 117 U/L 88  50  48   Total Bilirubin 0.2 - 1.2 mg/dL 0.8  1.5  0.9     Lab Results  Component Value Date/Time   TSH 1.51 03/29/2021 10:35 AM   FREET4 0.94 03/29/2021 10:35 AM       Latest Ref Rng & Units 09/25/2021    3:18 PM 08/12/2021    6:11 AM 08/11/2021    6:08 AM  CBC  WBC 4.0 - 10.5 K/uL 6.9  8.4  7.8   Hemoglobin 13.0 - 17.0 g/dL 40.9  81.1  91.4   Hematocrit 39.0 - 52.0 % 35.6  38.4  34.0   Platelets 150.0 - 400.0 K/uL 214.0  195  194     Lab Results  Component Value Date/Time   VD25OH 37.03 03/29/2021 10:35 AM   VITAMINB12 616 03/29/2021 10:35 AM    Clinical ASCVD: Yes  The ASCVD Risk score (Arnett DK, et al., 2019) failed to calculate for the following reasons:   The 2019 ASCVD risk score is only valid for ages 54 to 31   The patient has a prior MI or stroke diagnosis       03/19/2022    8:19 AM 09/24/2021    2:06 PM 07/24/2021    2:38 PM  Depression screen PHQ 2/9  Decreased Interest 0 0 0  Down, Depressed, Hopeless 0 0 0  PHQ - 2 Score 0 0 0     Social History   Tobacco Use  Smoking Status Never  Smokeless Tobacco Never   BP Readings from Last 3 Encounters:  08/29/22 124/70  08/15/22 134/78  08/01/22 120/74   Pulse Readings from Last 3 Encounters:  08/29/22 69  08/15/22 (!) 55  08/01/22 (!) 53   Wt Readings from Last 3 Encounters:  08/29/22 250 lb (113.4 kg)  08/15/22 247 lb (112 kg)  08/01/22 248 lb 3.2 oz (112.6 kg)   BMI Readings from Last 3 Encounters:  08/29/22 34.87 kg/m  08/15/22 34.45 kg/m  08/01/22 34.62 kg/m    Allergies  Allergen Reactions   Ace Inhibitors Rash   Amoxicillin Nausea Only   Atorvastatin Other (See Comments)    Dizziness.   Jardiance [Empagliflozin] Other (See Comments)    Orthostasis   Meperidine Rash   Naproxen Sodium Rash   Sulfa Antibiotics Nausea Only   Tetracycline Nausea Only    Medications Reviewed Today     Reviewed by Loyola Mast, MD (Physician) on 08/29/22  at 1052  Med List Status: <None>   Medication Order Taking? Sig Documenting Provider Last Dose Status Informant  albuterol (VENTOLIN HFA) 108 (90 Base) MCG/ACT inhaler [id number] Yes Inhale 2 puffs into the lungs every 6 (six) hours as needed for wheezing or shortness of breath. Loyola Mast, MD Taking Active   allopurinol (ZYLOPRIM) 100 MG tablet [id number] Yes Take 1 tablet (100 mg total) by mouth daily. Loyola Mast, MD Taking Active   Alpha-Lipoic Acid 600 MG TABS [id number] Yes  Take 600 mg by mouth in the morning and at bedtime. [provider] Taking Active Multiple Informants  b complex vitamins capsule [id number] Yes Take 1 capsule by mouth daily. [provider] Taking Active Multiple Informants  carvedilol (COREG) 3.125 MG tablet [id number] Yes Take 1 tablet (3.125 mg total) by mouth 2 (two) times daily with a meal. Baldo Daub, MD Taking Active   Cyanocobalamin (B-12) 1000 MCG SUBL [id number] Yes Place 1,000 mg under the tongue. [provider] Taking Active   dabigatran (PRADAXA) 150 MG CAPS capsule [id number] Yes TAKE 1 CAPSULE BY MOUTH 2 TIMES DAILY Baldo Daub, MD Taking Active   FEROSUL 325 (65 Fe) MG tablet [id number] Yes TAKE 1 TABLET BY MOUTH 2 TIMES A DAY Loyola Mast, MD Taking Active   furosemide (LASIX) 20 MG tablet [id number] Yes Take 2 tablets (40 mg total) by mouth daily with breakfast AND 1 tablet (20 mg total) daily before lunch. Loyola Mast, MD Taking Active   glucose blood test strip [id number] Yes OneTouch Ultra Blue Test Strip [provider] Taking Active Multiple Informants  hydrocortisone 2.5 % ointment [id number] Yes Apply 1 application topically 2 (two) times daily as needed (Skin Irritation). [provider] Taking Active Multiple Informants  lactobacillus acidophilus (BACID) TABS tablet [id number] Yes Take 1 tablet by mouth 3 (three) times daily. [provider] Taking Active   Lancets Letta Pate  [place] PLUS [place]) MISC [id number] Yes OneTouch Delica Plus Lancet 33 gauge [provider] Taking Active Multiple Informants  loperamide (IMODIUM A-D) 2 MG tablet [id number] Yes Take 2 mg by mouth 4 (four) times daily as needed for diarrhea or loose stools. [provider] Taking Active   Melatonin 5 MG CAPS [id number] Yes Take 5 mg by mouth at bedtime. [provider] Taking Active Multiple Informants  metFORMIN (GLUCOPHAGE-XR) 500 MG 24 hr tablet [id number] Yes TAKE 2 TABLETS BY MOUTH EVERY DAY WITH DINNER (DIABETES) Loyola Mast, MD Taking Active   OVER THE COUNTER MEDICATION [id number] Yes Take 25 mg by mouth daily. Balance of Nature: Fruits and Vegs supplements [provider] Taking Active   polyvinyl alcohol (LIQUIFILM TEARS) 1.4 % ophthalmic solution [id number] Yes Place 1 drop into both eyes as needed for dry eyes. [provider] Taking Active Multiple Informants   Patient not taking:   Discontinued 08/29/22 1002 (Completed Course)   psyllium (METAMUCIL) 58.6 % packet [id number] Yes Take 1 packet by mouth at bedtime. With a full glass of water [provider] Taking Active   REPATHA SURECLICK 140 MG/ML SOAJ [id number] Yes INJECT 140 MG INTO THE SKIN EVERY 14 DAYS Baldo Daub, MD Taking Active   SitaGLIPtin-MetFORMIN HCl (JANUMET XR) [id number] MG TB24 [id number] Yes TAKE 1 TABLET BY MOUTH EVERY DAY WITH BREAKFAST (FOR DIABETES) Loyola Mast, MD Taking Active   Spacer/Aero-Holding [place] DEVI [id number] Yes Use with albuterol inhaler Loyola Mast, MD Taking Active   traMADol-acetaminophen [facility]) 37.5-325 MG tablet [id number] Yes Take by mouth. [provider]  Active             SDOH:  (Social Determinants of Health) assessments and interventions performed: Yes SDOH Interventions    Flowsheet Row Care Coordination from 06/06/2022 in [facility] Clinical Support from 03/19/2022 in [facility] Clinical Support from 03/07/2021 in [facility] [employer]  SDOH Interventions     Food Insecurity Interventions -- Intervention Not Indicated Intervention  Not Indicated  Housing Interventions -- -- Intervention Not Indicated  Transportation Interventions Intervention Not Indicated Intervention Not Indicated Intervention Not Indicated  Financial Strain Interventions Intervention Not Indicated Intervention Not Indicated Intervention Not Indicated  Physical Activity Interventions -- Patient Refused, Other (Comments) Intervention Not Indicated  Stress Interventions -- Intervention Not Indicated Intervention Not Indicated  Social Connections Interventions -- -- Intervention Not Indicated       Medication Assistance: None required.  Patient affirms current coverage meets needs.  Medication Access: Within the past 30 days, how often has patient missed a dose of medication? None Is a pillbox or other method used to improve adherence? Yes  Factors that may affect medication adherence? no barriers identified Are meds synced by current pharmacy? No  Are meds delivered by current pharmacy? No  Does patient experience delays in picking up medications due to transportation concerns? No   Compliance/Adherence/Medication fill history: Care Gaps: Annual wellness visit in last year? Yes, Last completed 03/19/2022 Covid 19 Vaccine   If Diabetic: Last eye exam / retinopathy screening:Last completed 04/12/2022 per chart note Last diabetic foot exam:Last completed 01/11/2022   Star-Rating Drugs: Medication: Metformin 500 mg Last Fill: 02/08/2022 90 Day Supply at [facility]. Medication: Repatha 140 mg Last Fill: 05/02/2022 28 Day Supply at [facility]. Medication: Janumet [phone] mg Last Fill: 05/16/2022 90 Day Supply at [facility].   Assessment/Plan  Heart Failure (Goal: manage symptoms and prevent  exacerbations) -Controlled -Last ejection fraction: 45-50% (Date: May 2023) -HF type: HFmrEF (mildly reduced EF 41-49%) -NYHA Class: II (slight limitation of activity) -AHA HF Stage: C (Heart disease and symptoms present) -Current treatment: Carvedilol 3.125 mg twice daily  Furosemide 40 mg AM, 20 mg Noon  -Medications previously tried: ACEi (rash), Jardaince (Orthostasis)  -Current home daily weights: 247-249. Patient continues to experience leg swelling, patient thinks it is stable.  -Recommended to continue current medication  Atrial Fibrillation (Goal: prevent stroke and major bleeding) -Uncontrolled -CHADSVASC: 6 -Current treatment: Rate control: Carvedilol 3.125 mg twice daily Anticoagulation: Dabigatran 150 mg twice daily  -Medications previously tried: NA -Patient reports frequent bruising, but denies any serious/unusual bleeding. DDI with Carvedilol + Dabigatran increasing bleeding risk. Discussed potentially switching to alternate anticoagulant such as Eliquis. Patient will discuss at next Cardiology follow-up.  -Recommended to continue current medication  Hyperlipidemia: (LDL goal < 55) -Controlled -History of CAD s/p PCI 2002 -Current treatment: Repatha 140 mg every 2 weeks  -Medications previously tried: Statins  -Recommended to continue current medication  Diabetes (A1c goal <8%) -Controlled -Current medications: Metformin ER 1000 mg daily  JanuMet ER [phone] mg daily  -Medications previously tried: Jardiance (Orthostasis) -Current home glucose readings fasting glucose: 96-116  post prandial glucose: 140,  -Patient concerned with worsening neuropathy symptoms. He reports tingling in both arms and legs throughout the day. He previously tried gabapentin but did not tolerate it.  -Denies hypoglycemic/hyperglycemic symptoms -Given history of ASCVD and CKD recommended switching regimen to a GLP-1 agonist or SGLT2 inhibitor. Patient was potentially agreeable, but  preferred to keep things the same since his blood sugars were doing so well.  -Continue current medications   Chronic Kidney Disease Stage 3a  -All medications assessed for renal dosing and appropriateness in chronic kidney disease. -Recommended to continue current medication  Angelena Sole, PharmD, P[place]y [place], CPP Clinical Pharmacist Practitioner  [facility]  [phone]

## 2022-09-09 DIAGNOSIS — I130 Hypertensive heart and chronic kidney disease with heart failure and stage 1 through stage 4 chronic kidney disease, or unspecified chronic kidney disease: Secondary | ICD-10-CM | POA: Diagnosis not present

## 2022-09-09 DIAGNOSIS — Z833 Family history of diabetes mellitus: Secondary | ICD-10-CM | POA: Diagnosis not present

## 2022-09-09 DIAGNOSIS — E1122 Type 2 diabetes mellitus with diabetic chronic kidney disease: Secondary | ICD-10-CM | POA: Diagnosis not present

## 2022-09-09 DIAGNOSIS — M310 Hypersensitivity angiitis: Secondary | ICD-10-CM | POA: Diagnosis not present

## 2022-09-09 DIAGNOSIS — J449 Chronic obstructive pulmonary disease, unspecified: Secondary | ICD-10-CM | POA: Diagnosis not present

## 2022-09-09 DIAGNOSIS — I509 Heart failure, unspecified: Secondary | ICD-10-CM | POA: Diagnosis not present

## 2022-09-09 DIAGNOSIS — E785 Hyperlipidemia, unspecified: Secondary | ICD-10-CM | POA: Diagnosis not present

## 2022-09-09 DIAGNOSIS — K219 Gastro-esophageal reflux disease without esophagitis: Secondary | ICD-10-CM | POA: Diagnosis not present

## 2022-09-09 DIAGNOSIS — I25119 Atherosclerotic heart disease of native coronary artery with unspecified angina pectoris: Secondary | ICD-10-CM | POA: Diagnosis not present

## 2022-09-09 DIAGNOSIS — Z8572 Personal history of non-Hodgkin lymphomas: Secondary | ICD-10-CM | POA: Diagnosis not present

## 2022-09-09 DIAGNOSIS — G4733 Obstructive sleep apnea (adult) (pediatric): Secondary | ICD-10-CM | POA: Diagnosis not present

## 2022-09-09 DIAGNOSIS — Z961 Presence of intraocular lens: Secondary | ICD-10-CM | POA: Diagnosis not present

## 2022-09-09 DIAGNOSIS — D692 Other nonthrombocytopenic purpura: Secondary | ICD-10-CM | POA: Diagnosis not present

## 2022-09-09 DIAGNOSIS — I252 Old myocardial infarction: Secondary | ICD-10-CM | POA: Diagnosis not present

## 2022-09-09 DIAGNOSIS — E1159 Type 2 diabetes mellitus with other circulatory complications: Secondary | ICD-10-CM | POA: Diagnosis not present

## 2022-09-09 DIAGNOSIS — I872 Venous insufficiency (chronic) (peripheral): Secondary | ICD-10-CM | POA: Diagnosis not present

## 2022-09-09 DIAGNOSIS — H04129 Dry eye syndrome of unspecified lacrimal gland: Secondary | ICD-10-CM | POA: Diagnosis not present

## 2022-09-09 DIAGNOSIS — E11620 Type 2 diabetes mellitus with diabetic dermatitis: Secondary | ICD-10-CM | POA: Diagnosis not present

## 2022-09-09 DIAGNOSIS — M1030 Gout due to renal impairment, unspecified site: Secondary | ICD-10-CM | POA: Diagnosis not present

## 2022-09-09 DIAGNOSIS — M1990 Unspecified osteoarthritis, unspecified site: Secondary | ICD-10-CM | POA: Diagnosis not present

## 2022-09-09 DIAGNOSIS — G2581 Restless legs syndrome: Secondary | ICD-10-CM | POA: Diagnosis not present

## 2022-09-09 DIAGNOSIS — N529 Male erectile dysfunction, unspecified: Secondary | ICD-10-CM | POA: Diagnosis not present

## 2022-09-09 DIAGNOSIS — R2681 Unsteadiness on feet: Secondary | ICD-10-CM | POA: Diagnosis not present

## 2022-09-12 ENCOUNTER — Encounter: Payer: Self-pay | Admitting: Urology

## 2022-09-12 ENCOUNTER — Ambulatory Visit: Payer: Medicare PPO | Admitting: Urology

## 2022-09-12 VITALS — BP 132/77 | HR 62 | Ht 70.0 in | Wt 246.0 lb

## 2022-09-12 DIAGNOSIS — R3129 Other microscopic hematuria: Secondary | ICD-10-CM

## 2022-09-12 DIAGNOSIS — N5089 Other specified disorders of the male genital organs: Secondary | ICD-10-CM | POA: Diagnosis not present

## 2022-09-12 DIAGNOSIS — N401 Enlarged prostate with lower urinary tract symptoms: Secondary | ICD-10-CM | POA: Diagnosis not present

## 2022-09-12 DIAGNOSIS — N138 Other obstructive and reflux uropathy: Secondary | ICD-10-CM | POA: Diagnosis not present

## 2022-09-12 LAB — MICROSCOPIC EXAMINATION
Cast Type: NONE SEEN
Casts: NONE SEEN /lpf
Crystal Type: NONE SEEN
Crystals: NONE SEEN
Renal Epithel, UA: NONE SEEN /hpf
Trichomonas, UA: NONE SEEN
Yeast, UA: NONE SEEN

## 2022-09-12 LAB — URINALYSIS, ROUTINE W REFLEX MICROSCOPIC
Bilirubin, UA: NEGATIVE
Glucose, UA: NEGATIVE
Ketones, UA: NEGATIVE
Nitrite, UA: NEGATIVE
Protein,UA: NEGATIVE
Specific Gravity, UA: 1.02 (ref 1.005–1.030)
Urobilinogen, Ur: 0.2 mg/dL (ref 0.2–1.0)
pH, UA: 7 (ref 5.0–7.5)

## 2022-09-12 NOTE — Progress Notes (Deleted)
Assessment: 1. Scrotal mass     Plan: I personally reviewed the patient's chart including provider notes, lab and imaging results.   Chief Complaint: No chief complaint on file.   History of Present Illness:  Shawn Blanchard is a 87 y.o. male who is seen in consultation from Loyola Mast, MD for evaluation of scrotal mass. He was seen by his PCP on 08/15/2022 with bilateral scrotal pain and swelling.  He was treated with Levaquin x 10 days. Scrotal ultrasound from 08/26/2022 showed normal testes bilateral, benign cyst of the head of the right epididymis, complex and vague soft tissue abnormality, possible complex fluid adjacent to the right testicle measuring approximately 2.1 cm suggestive of an infectious/inflammatory process or hematoma.    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   Aortic atherosclerosis (HCC) 06/22/2021   Arthritis 03/12/2016   Blepharitis of both upper and lower eyelid 10/02/2018   BMI 35.0-35.9,adult 04/11/2016   Carpal tunnel syndrome of right wrist 03/25/2019   Chronic anticoagulation 10/11/2016   Chronic atrial fibrillation (HCC) 06/12/2015   Overview:  Managed CARDS   Chronic diastolic heart failure (HCC) 09/01/2015   Class 1 obesity 08/08/2021   Coronary artery disease involving native coronary artery of native heart with angina pectoris (HCC) 02/15/2015   Overview:  Hx of MI with stent to LAD 2002 MPS 2016 with EF 47% and no ischemia   Dermatochalasis of both upper eyelids 11/21/2016   Diabetic foot infection (HCC) 08/08/2021   Diabetic polyneuropathy associated with type 2 diabetes mellitus (HCC) 12/16/2018   2020   Disorder of sesamoid bone of foot    Essential hypertension 11/07/2017   Floppy eyelid syndrome of both eyes 10/05/2019   Ganglion cyst 11/27/2015   Overview:  2017: dorsum right hand    Gastroesophageal reflux disease without esophagitis 06/12/2015   Gout of left foot    History of lymphoma 05/17/2021   History of squamous cell carcinoma in situ 04/18/2021   Hyperlipidemia 04/11/2016   Hypertensive heart disease with heart failure (HCC) 02/15/2015   Hypertensive urgency 05/17/2021   Idiopathic chronic gout 05/17/2015   Insomnia 11/27/2015   Iron deficiency anemia 04/27/2021   Leukocytosis 04/11/2016   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   Male erectile disorder 06/17/2016   Meibomian gland dysfunction (MGD) of both eyes 10/02/2018   Normocytic anemia 05/17/2021   Obstructive sleep apnea 08/30/2015   Overview:  CPAP   Old MI (myocardial infarction)    Orthostatic dizziness 09/15/2017   2018: med related   Peripheral venous insufficiency 11/08/2020   Peritoneal adhesion 11/08/2020   Pseudophakia of both eyes 03/12/2016   Pulmonary hypertension (HCC) 08/30/2015   Overview:  Managed CARDS   Restless leg syndrome 08/30/2015   SBO (small bowel obstruction) (HCC) 12/14/2019   Sensorineural hearing loss (SNHL), bilateral 10/04/2019   Small bowel obstruction (HCC) 04/11/2016   Stage 3a chronic kidney disease (HCC) 06/13/2019   Statin intolerance 03/09/2021   Type 2 diabetes mellitus with neurologic complication, without long-term current use of insulin (HCC) 12/02/2014   Type 2 diabetes mellitus with stage 3a chronic kidney disease, without long-term current use of insulin (HCC) 02/22/2020   Ulcer of left foot with fat layer exposed (HCC)    UTI (urinary tract infection) 05/17/2021    Past Surgical History:  Past Surgical History:  Procedure Laterality Date  ABDOMINAL ADHESION SURGERY     x 2   BLEPHAROPLASTY Bilateral    CARPAL TUNNEL RELEASE Right 2021   CATARACT EXTRACTION Bilateral    CERVICAL SPINE SURGERY     CHOLECYSTECTOMY     CORONARY ANGIOPLASTY WITH STENT PLACEMENT  2000   IRRIGATION AND DEBRIDEMENT FOOT Left 08/11/2021   Procedure: IRRIGATION AND  DEBRIDEMENT FOOT, AND BONE BIOPSY, PARTIAL SESMOIDECTOMY;  Surgeon: Edwin Cap, DPM;  Location: [facility];  Service: Podiatry;  Laterality: Left;   NASAL SINUS SURGERY     ORIF TIBIAL SHAFT FRACTURE W/ PLATES AND SCREWS Left    PARTIAL KNEE ARTHROPLASTY Bilateral    SMALL INTESTINE SURGERY     Adenocarcinoma   TONSILLECTOMY      Allergies:  Allergies  Allergen Reactions   Ace Inhibitors Rash   Amoxicillin Nausea Only   Atorvastatin Other (See Comments)    Dizziness.   Jardiance [Empagliflozin] Other (See Comments)    Orthostasis   Meperidine Rash   Naproxen Sodium Rash   Sulfa Antibiotics Nausea Only   Tetracycline Nausea Only    Family History:  Family History  Problem Relation Age of Onset   Cancer Mother        Ovarian   Diabetes Mother    Heart disease Mother    Heart disease Father    Stroke Father    Diabetes Father    Diabetes Sister    Diabetes Brother    Diabetes Brother    Cancer Paternal Grandfather        Prostate   Colon cancer Neg Hx    Rectal cancer Neg Hx    Pancreatic cancer Neg Hx    Liver cancer Neg Hx    Stomach cancer Neg Hx     Social History:  Social History   Tobacco Use   Smoking status: Never   Smokeless tobacco: Never  Vaping Use   Vaping Use: Never used  Substance Use Topics   Alcohol use: Not Currently   Drug use: Never    Review of symptoms:  Constitutional:  Negative for unexplained weight loss, night sweats, fever, chills ENT:  Negative for nose bleeds, sinus pain, painful swallowing CV:  Negative for chest pain, shortness of breath, exercise intolerance, palpitations, loss of consciousness Resp:  Negative for cough, wheezing, shortness of breath GI:  Negative for nausea, vomiting, diarrhea, bloody stools GU:  Positives noted in HPI; otherwise negative for gross hematuria, dysuria, urinary incontinence Neuro:  Negative for seizures, poor balance, limb weakness, slurred speech Psych:  Negative for lack of energy,  depression, anxiety Endocrine:  Negative for polydipsia, polyuria, symptoms of hypoglycemia (dizziness, hunger, sweating) Hematologic:  Negative for anemia, purpura, petechia, prolonged or excessive bleeding, use of anticoagulants  Allergic:  Negative for difficulty breathing or choking as a result of exposure to anything; no shellfish allergy; no allergic response (rash/itch) to materials, foods  Physical exam: There were no vitals taken for this visit. GENERAL APPEARANCE:  Well appearing, well developed, well nourished, NAD HEENT: Atraumatic, Normocephalic, oropharynx clear. NECK: Supple without lymphadenopathy or thyromegaly. LUNGS: Clear to auscultation bilaterally. HEART: Regular Rate and Rhythm without murmurs, gallops, or rubs. ABDOMEN: Soft, non-tender, No Masses. EXTREMITIES: Moves all extremities well.  Without clubbing, cyanosis, or edema. NEUROLOGIC:  Alert and oriented x 3, normal gait, CN II-XII grossly intact.  MENTAL STATUS:  Appropriate. BACK:  Non-tender to palpation.  No CVAT SKIN:  Warm, dry and intact.    Results: No results found for this  or any previous visit (from the past 24 hour(s)).

## 2022-09-12 NOTE — Progress Notes (Signed)
Assessment: 1. Scrotal mass   2. BPH with obstruction/lower urinary tract symptoms   3. Microscopic hematuria     Plan: I personally reviewed the patient's chart including provider notes, lab and imaging results. I personally viewed the ultrasound study from 08/22/2022 with results as noted below. I think that the abnormality seen on the scrotal ultrasound was likely an inflammatory process related to his epididymitis and appears to be resolving.  I think it be reasonable to repeat a scrotal ultrasound in 6-8 weeks to confirm resolution. I discussed a trial of alpha-blocker therapy for his lower urinary tract symptoms.  I discussed potential side effects of alpha blockers including hypotension.  At the present time, he would like to postpone any treatment. Will recheck a urinalysis at his next visit given the findings of microscopic hematuria. Return to office in 6 weeks  Chief Complaint:  Chief Complaint  Patient presents with   scrotal mass    History of Present Illness:  Shawn Blanchard is a 87 y.o. male who is seen in consultation from Loyola Mast, MD for evaluation of scrotal mass. He was seen by his PCP on 08/15/2022 with bilateral scrotal discomfort and swelling.  He was treated with Levaquin x 5 days.  He noted improvement in the scrotal swelling and discomfort following the antibiotics.  He is not having any scrotal pain at the present time.  He reports that the scrotal swelling has almost completely resolved.  No history of scrotal trauma. Scrotal ultrasound from 08/22/2022 showed normal testes bilateral, benign cyst of the head of the right epididymis, complex and vague soft tissue abnormality, possible complex fluid adjacent to the right testicle measuring approximately 2.1 cm suggestive of an infectious/inflammatory process or hematoma.  He reports lower urinary tract symptoms including frequency, nocturia 3-4 times, occasional incontinence and a decreased stream.  No  dysuria or gross hematuria. IPSS = 12 today.   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   Aortic atherosclerosis (HCC) 06/22/2021   Arthritis 03/12/2016   Blepharitis of both upper and lower eyelid 10/02/2018   BMI 35.0-35.9,adult 04/11/2016   Carpal tunnel syndrome of right wrist 03/25/2019   Chronic anticoagulation 10/11/2016   Chronic atrial fibrillation (HCC) 06/12/2015   Overview:  Managed CARDS   Chronic diastolic heart failure (HCC) 09/01/2015   Class 1 obesity 08/08/2021   Coronary artery disease involving native coronary artery of native heart with angina pectoris (HCC) 02/15/2015   Overview:  Hx of MI with stent to LAD 2002 MPS 2016 with EF 47% and no ischemia   Dermatochalasis of both upper eyelids 11/21/2016   Diabetic foot infection (HCC) 08/08/2021   Diabetic polyneuropathy associated with type 2 diabetes mellitus (HCC) 12/16/2018   2020   Disorder of sesamoid bone of foot    Essential hypertension 11/07/2017   Floppy eyelid syndrome of both eyes 10/05/2019   Ganglion cyst 11/27/2015   Overview:  2017: dorsum right hand   Gastroesophageal reflux disease without esophagitis 06/12/2015   Gout of left foot    History of lymphoma 05/17/2021   History of squamous cell carcinoma in situ 04/18/2021   Hyperlipidemia 04/11/2016   Hypertensive heart disease with heart failure (HCC) 02/15/2015   Hypertensive urgency 05/17/2021   Idiopathic chronic gout 05/17/2015   Insomnia 11/27/2015   Iron deficiency anemia 04/27/2021   Leukocytosis 04/11/2016  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   Male erectile disorder 06/17/2016   Meibomian gland dysfunction (MGD) of both eyes 10/02/2018   Normocytic anemia 05/17/2021   Obstructive sleep apnea 08/30/2015   Overview:  CPAP   Old MI (myocardial infarction)    Orthostatic dizziness 09/15/2017    2018: med related   Peripheral venous insufficiency 11/08/2020   Peritoneal adhesion 11/08/2020   Pseudophakia of both eyes 03/12/2016   Pulmonary hypertension (HCC) 08/30/2015   Overview:  Managed CARDS   Restless leg syndrome 08/30/2015   SBO (small bowel obstruction) (HCC) 12/14/2019   Sensorineural hearing loss (SNHL), bilateral 10/04/2019   Small bowel obstruction (HCC) 04/11/2016   Stage 3a chronic kidney disease (HCC) 06/13/2019   Statin intolerance 03/09/2021   Type 2 diabetes mellitus with neurologic complication, without long-term current use of insulin (HCC) 12/02/2014   Type 2 diabetes mellitus with stage 3a chronic kidney disease, without long-term current use of insulin (HCC) 02/22/2020   Ulcer of left foot with fat layer exposed (HCC)    UTI (urinary tract infection) 05/17/2021    Past Surgical History:  Past Surgical History:  Procedure Laterality Date   ABDOMINAL ADHESION SURGERY     x 2   BLEPHAROPLASTY Bilateral    CARPAL TUNNEL RELEASE Right 2021   CATARACT EXTRACTION Bilateral    CERVICAL SPINE SURGERY     CHOLECYSTECTOMY     CORONARY ANGIOPLASTY WITH STENT PLACEMENT  2000   IRRIGATION AND DEBRIDEMENT FOOT Left 08/11/2021   Procedure: IRRIGATION AND DEBRIDEMENT FOOT, AND BONE BIOPSY, PARTIAL SESMOIDECTOMY;  Surgeon: Edwin Cap, DPM;  Location: [facility];  Service: Podiatry;  Laterality: Left;   NASAL SINUS SURGERY     ORIF TIBIAL SHAFT FRACTURE W/ PLATES AND SCREWS Left    PARTIAL KNEE ARTHROPLASTY Bilateral    SMALL INTESTINE SURGERY     Adenocarcinoma   TONSILLECTOMY      Allergies:  Allergies  Allergen Reactions   Ace Inhibitors Rash   Amoxicillin Nausea Only   Atorvastatin Other (See Comments)    Dizziness.   Jardiance [Empagliflozin] Other (See Comments)    Orthostasis   Meperidine Rash   Naproxen Sodium Rash   Sulfa Antibiotics Nausea Only   Tetracycline Nausea Only    Family History:  Family History  Problem Relation Age of Onset   Cancer  Mother        Ovarian   Diabetes Mother    Heart disease Mother    Heart disease Father    Stroke Father    Diabetes Father    Diabetes Sister    Diabetes Brother    Diabetes Brother    Cancer Paternal Grandfather        Prostate   Colon cancer Neg Hx    Rectal cancer Neg Hx    Pancreatic cancer Neg Hx    Liver cancer Neg Hx    Stomach cancer Neg Hx     Social History:  Social History   Tobacco Use   Smoking status: Never   Smokeless tobacco: Never  Vaping Use   Vaping Use: Never used  Substance Use Topics   Alcohol use: Not Currently   Drug use: Never    Review of symptoms:  Constitutional:  Negative for unexplained weight loss, night sweats, fever, chills ENT:  Negative for nose bleeds, sinus pain, painful swallowing CV:  Negative for chest pain, shortness of breath, exercise intolerance, palpitations, loss of consciousness Resp:  Negative for cough, wheezing, shortness of  breath GI:  Negative for nausea, vomiting, diarrhea, bloody stools GU:  Positives noted in HPI; otherwise negative for gross hematuria, dysuria, urinary incontinence Neuro:  Negative for seizures, poor balance, limb weakness, slurred speech Psych:  Negative for lack of energy, depression, anxiety Endocrine:  Negative for polydipsia, polyuria, symptoms of hypoglycemia (dizziness, hunger, sweating) Hematologic:  Negative for anemia, purpura, petechia, prolonged or excessive bleeding, use of anticoagulants  Allergic:  Negative for difficulty breathing or choking as a result of exposure to anything; no shellfish allergy; no allergic response (rash/itch) to materials, foods  Physical exam: BP 132/77   Pulse 62   Ht 5\' 10"  (1.778 m)   Wt 246 lb (111.6 kg)   BMI 35.30 kg/m  GENERAL APPEARANCE:  Well appearing, well developed, well nourished, NAD HEENT: Atraumatic, Normocephalic, oropharynx clear. NECK: Supple without lymphadenopathy or thyromegaly. LUNGS: Clear to auscultation bilaterally. HEART:  Regular Rate and Rhythm without murmurs, gallops, or rubs. ABDOMEN: Soft, non-tender, No Masses. EXTREMITIES: Moves all extremities well.  Without clubbing, cyanosis, or edema. NEUROLOGIC:  Alert and oriented x 3, CN II-XII grossly intact.  MENTAL STATUS:  Appropriate. BACK:  Non-tender to palpation.  No CVAT SKIN:  Warm, dry and intact.   GU: Penis:  uncircumcised Meatus: Normal Scrotum: mild bilateral enlargement; no erythema Testis: normal without masses bilateral Epididymis: non-tender, some fullness of right epididymal tail Prostate: 50 g, NT, no nodules Rectum: Normal tone,  no masses or tenderness   Results: U/A: 3-10 RBC

## 2022-09-18 ENCOUNTER — Other Ambulatory Visit: Payer: Self-pay | Admitting: Cardiology

## 2022-09-18 MED ORDER — DABIGATRAN ETEXILATE MESYLATE 150 MG PO CAPS: 150.0000 mg | ORAL_CAPSULE | Freq: Two times a day (BID) | ORAL | 1 refills | Status: DC

## 2022-09-18 NOTE — Addendum Note (Signed)
Addended by: Louanna Raw on: 09/18/2022 10:20 AM   Modules accepted: Orders

## 2022-09-18 NOTE — Telephone Encounter (Signed)
Refill request for Pradaxa:  Dx: AF LOV  03/28/22  Caryl Pina MD Age 87 Weight 114kg Scr 1.45 on 05/11/23  Epic CrCl 61.15  Based on above findings Pradaxa 150mg  twice daily is the appropriate dose.  Refill approved.

## 2022-09-18 NOTE — Telephone Encounter (Signed)
Rx sent to pharmacy   

## 2022-09-25 ENCOUNTER — Other Ambulatory Visit: Payer: Self-pay | Admitting: Family Medicine

## 2022-09-25 DIAGNOSIS — D509 Iron deficiency anemia, unspecified: Secondary | ICD-10-CM

## 2022-09-25 NOTE — Telephone Encounter (Signed)
Chart supports rx. Last OV: 08/29/2022 Next OV: 10/31/2022

## 2022-10-27 ENCOUNTER — Encounter: Payer: Self-pay | Admitting: Family Medicine

## 2022-10-30 ENCOUNTER — Encounter: Payer: Self-pay | Admitting: Urology

## 2022-10-30 ENCOUNTER — Ambulatory Visit: Payer: Medicare PPO | Admitting: Urology

## 2022-10-30 VITALS — BP 132/50 | HR 57 | Ht 71.0 in | Wt 243.0 lb

## 2022-10-30 DIAGNOSIS — N5089 Other specified disorders of the male genital organs: Secondary | ICD-10-CM | POA: Insufficient documentation

## 2022-10-30 DIAGNOSIS — R3129 Other microscopic hematuria: Secondary | ICD-10-CM | POA: Diagnosis not present

## 2022-10-30 DIAGNOSIS — N401 Enlarged prostate with lower urinary tract symptoms: Secondary | ICD-10-CM | POA: Diagnosis not present

## 2022-10-30 DIAGNOSIS — B356 Tinea cruris: Secondary | ICD-10-CM | POA: Diagnosis not present

## 2022-10-30 DIAGNOSIS — N138 Other obstructive and reflux uropathy: Secondary | ICD-10-CM | POA: Diagnosis not present

## 2022-10-30 LAB — URINALYSIS, ROUTINE W REFLEX MICROSCOPIC
Bilirubin, UA: NEGATIVE
Glucose, UA: NEGATIVE
Ketones, UA: NEGATIVE
Nitrite, UA: NEGATIVE
Specific Gravity, UA: 1.02 (ref 1.005–1.030)
Urobilinogen, Ur: 0.2 mg/dL (ref 0.2–1.0)
pH, UA: 6 (ref 5.0–7.5)

## 2022-10-30 LAB — MICROSCOPIC EXAMINATION: RBC, Urine: >30 /hpf — ABNORMAL HIGH (ref 0–2)

## 2022-10-30 MED ORDER — NYSTATIN 100000 UNIT/GM EX CREA
1.0000 | TOPICAL_CREAM | Freq: Two times a day (BID) | CUTANEOUS | 1 refills | Status: DC
Start: 2022-10-30 — End: 2022-11-10

## 2022-10-30 NOTE — Progress Notes (Signed)
Assessment: 1. Scrotal mass, right   2. BPH with obstruction/lower urinary tract symptoms   3. Microscopic hematuria   4. Tinea cruris     Plan: His scrotal exam shows continued improvement.  I think it be reasonable to continue to monitor at the present time. I discussed the urinalysis results with the patient and his daughter. Today I had a discussion with the patient regarding the findings of microscopic hematuria including the implications and differential diagnoses associated with it.  I also discussed recommendations for further evaluation including the rationale for upper tract imaging and cystoscopy.  I discussed the nature of these procedures including potential risk and complications.  The patient expressed an understanding of these issues. Schedule for CT hematuria protocol and cystoscopy Nystatin cream to groin area BID for tinea cruris BMP today   Chief Complaint:  Chief Complaint  Patient presents with   Scrotal Mass    History of Present Illness:  Shawn Blanchard is a 87 y.o. male who is seen for further evaluation of scrotal mass, BPH with LUTS, and microscopic hematuria. He was seen by his PCP on 08/15/2022 with bilateral scrotal discomfort and swelling.  He was treated with Levaquin x 5 days.  He noted improvement in the scrotal swelling and discomfort following the antibiotics.  He is not having any scrotal pain at the present time.  He reports that the scrotal swelling has almost completely resolved.  No history of scrotal trauma. Scrotal ultrasound from 08/22/2022 showed normal testes bilateral, benign cyst of the head of the right epididymis, complex and vague soft tissue abnormality, possible complex fluid adjacent to the right testicle measuring approximately 2.1 cm suggestive of an infectious/inflammatory process or hematoma.  He reported lower urinary tract symptoms including frequency, nocturia 3-4 times, occasional incontinence and a decreased stream.  No  dysuria or gross hematuria. IPSS = 12.  At his initial visit in June 2024, his exam suggested a right-sided epididymal mass likely associated with his resolving epididymitis.  He did not wish to begin any medical therapy for his lower urinary tract symptoms. Urinalysis showed 3-10 RBCs.  He returns today for follow-up.  He has not had any right-sided scrotal pain.  He continues to report a rash in his groin area and is requesting treatment for this.  His lower urinary tract symptoms remain unchanged.  He continues with nocturia and frequency.  No dysuria or gross hematuria. IPSS = 12 today.  Portions of the above documentation were copied from a prior visit for review purposes only.   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   Aortic atherosclerosis (HCC) 06/22/2021   Arthritis 03/12/2016   Blepharitis of both upper and lower eyelid 10/02/2018   BMI 35.0-35.9,adult 04/11/2016   Carpal tunnel syndrome of right wrist 03/25/2019   Chronic anticoagulation 10/11/2016   Chronic atrial fibrillation (HCC) 06/12/2015   Overview:  Managed CARDS   Chronic diastolic heart failure (HCC) 09/01/2015   Class 1 obesity 08/08/2021   Coronary artery disease involving native coronary artery of native heart with angina pectoris (HCC) 02/15/2015   Overview:  Hx of MI with stent to LAD 2002 MPS 2016 with EF 47% and no ischemia   Dermatochalasis of both upper eyelids 11/21/2016   Diabetic foot infection (HCC) 08/08/2021   Diabetic polyneuropathy associated with type 2 diabetes mellitus (HCC) 12/16/2018  2020   Disorder of sesamoid bone of foot    Essential hypertension 11/07/2017   Floppy eyelid syndrome of both eyes 10/05/2019   Ganglion cyst 11/27/2015   Overview:  2017: dorsum right hand   Gastroesophageal reflux disease without esophagitis 06/12/2015   Gout of  left foot    History of lymphoma 05/17/2021   History of squamous cell carcinoma in situ 04/18/2021   Hyperlipidemia 04/11/2016   Hypertensive heart disease with heart failure (HCC) 02/15/2015   Hypertensive urgency 05/17/2021   Idiopathic chronic gout 05/17/2015   Insomnia 11/27/2015   Iron deficiency anemia 04/27/2021   Leukocytosis 04/11/2016   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   Male erectile disorder 06/17/2016   Meibomian gland dysfunction (MGD) of both eyes 10/02/2018   Normocytic anemia 05/17/2021   Obstructive sleep apnea 08/30/2015   Overview:  CPAP   Old MI (myocardial infarction)    Orthostatic dizziness 09/15/2017   2018: med related   Peripheral venous insufficiency 11/08/2020   Peritoneal adhesion 11/08/2020   Pseudophakia of both eyes 03/12/2016   Pulmonary hypertension (HCC) 08/30/2015   Overview:  Managed CARDS   Restless leg syndrome 08/30/2015   SBO (small bowel obstruction) (HCC) 12/14/2019   Sensorineural hearing loss (SNHL), bilateral 10/04/2019   Small bowel obstruction (HCC) 04/11/2016   Stage 3a chronic kidney disease (HCC) 06/13/2019   Statin intolerance 03/09/2021   Type 2 diabetes mellitus with neurologic complication, without long-term current use of insulin (HCC) 12/02/2014   Type 2 diabetes mellitus with stage 3a chronic kidney disease, without long-term current use of insulin (HCC) 02/22/2020   Ulcer of left foot with fat layer exposed (HCC)    UTI (urinary tract infection) 05/17/2021    Past Surgical History:  Past Surgical History:  Procedure Laterality Date   ABDOMINAL ADHESION SURGERY     x 2   BLEPHAROPLASTY Bilateral    CARPAL TUNNEL RELEASE Right 2021   CATARACT EXTRACTION Bilateral    CERVICAL SPINE SURGERY     CHOLECYSTECTOMY     CORONARY ANGIOPLASTY WITH STENT PLACEMENT  2000   IRRIGATION AND DEBRIDEMENT FOOT Left 08/11/2021   Procedure: IRRIGATION AND DEBRIDEMENT FOOT, AND BONE BIOPSY, PARTIAL SESMOIDECTOMY;  Surgeon: Edwin Cap, DPM;  Location: [facility];  Service: Podiatry;  Laterality: Left;   NASAL SINUS SURGERY     ORIF TIBIAL SHAFT FRACTURE W/ PLATES AND SCREWS Left    PARTIAL KNEE ARTHROPLASTY Bilateral    SMALL INTESTINE SURGERY     Adenocarcinoma   TONSILLECTOMY      Allergies:  Allergies  Allergen Reactions   Ace Inhibitors Rash   Amoxicillin Nausea Only   Atorvastatin Other (See Comments)    Dizziness.   Jardiance [Empagliflozin] Other (See Comments)    Orthostasis   Meperidine Rash   Naproxen Sodium Rash   Sulfa Antibiotics Nausea Only   Tetracycline Nausea Only    Family History:  Family History  Problem Relation Age of Onset   Cancer Mother        Ovarian   Diabetes Mother    Heart disease Mother    Heart disease Father    Stroke Father    Diabetes Father    Diabetes Sister    Diabetes Brother    Diabetes Brother    Cancer Paternal Grandfather        Prostate   Colon cancer Neg Hx    Rectal cancer Neg Hx    Pancreatic cancer Neg Hx  Liver cancer Neg Hx    Stomach cancer Neg Hx     Social History:  Social History   Tobacco Use   Smoking status: Never   Smokeless tobacco: Never  Vaping Use   Vaping status: Never Used  Substance Use Topics   Alcohol use: Not Currently   Drug use: Never    ROS: Constitutional:  Negative for fever, chills, weight loss CV: Negative for chest pain, previous MI, hypertension Respiratory:  Negative for shortness of breath, wheezing, sleep apnea, frequent cough GI:  Negative for nausea, vomiting, bloody stool, GERD  Physical exam: BP (!) 132/50   Pulse (!) 57   Ht 5\' 11"  (1.803 m)   Wt 243 lb (110.2 kg)   BMI 33.89 kg/m  GENERAL APPEARANCE:  Well appearing, well developed, well nourished, NAD HEENT:  Atraumatic, normocephalic, oropharynx clear NECK:  Supple without lymphadenopathy or thyromegaly ABDOMEN:  Soft, non-tender, no masses EXTREMITIES:  Moves all extremities well, without clubbing, cyanosis, or edema NEUROLOGIC:   Alert and oriented x 3, normal gait, CN II-XII grossly intact MENTAL STATUS:  appropriate BACK:  Non-tender to palpation, No CVAT SKIN:  Warm, dry, and intact GU: Penis:  uncircumcised Meatus: Normal Scrotum: no erythema or edema; bilateral groin rash consistent with tinea cruris Testis: normal without masses bilateral Epididymis: NT, decreased fullness of right epididymal tail   Results: U/A: 6-10 WBC, >30 RBC, few bacteria

## 2022-10-31 ENCOUNTER — Encounter: Payer: Self-pay | Admitting: Family Medicine

## 2022-10-31 ENCOUNTER — Ambulatory Visit: Payer: Medicare PPO | Admitting: Family Medicine

## 2022-10-31 VITALS — BP 118/66 | HR 87 | Temp 98.1°F | Ht 71.0 in | Wt 251.8 lb

## 2022-10-31 DIAGNOSIS — Z9189 Other specified personal risk factors, not elsewhere classified: Secondary | ICD-10-CM | POA: Diagnosis not present

## 2022-10-31 DIAGNOSIS — I10 Essential (primary) hypertension: Secondary | ICD-10-CM

## 2022-10-31 DIAGNOSIS — R3129 Other microscopic hematuria: Secondary | ICD-10-CM

## 2022-10-31 DIAGNOSIS — E782 Mixed hyperlipidemia: Secondary | ICD-10-CM

## 2022-10-31 DIAGNOSIS — E1142 Type 2 diabetes mellitus with diabetic polyneuropathy: Secondary | ICD-10-CM | POA: Diagnosis not present

## 2022-10-31 DIAGNOSIS — I482 Chronic atrial fibrillation, unspecified: Secondary | ICD-10-CM | POA: Diagnosis not present

## 2022-10-31 DIAGNOSIS — I5032 Chronic diastolic (congestive) heart failure: Secondary | ICD-10-CM

## 2022-10-31 LAB — BASIC METABOLIC PANEL
BUN/Creatinine Ratio: 21 (ref 10–24)
BUN: 33 mg/dL — ABNORMAL HIGH (ref 8–27)
CO2: 24 mmol/L (ref 20–29)
Calcium: 9.6 mg/dL (ref 8.6–10.2)
Chloride: 101 mmol/L (ref 96–106)
Creatinine, Ser: 1.58 mg/dL — ABNORMAL HIGH (ref 0.76–1.27)
Glucose: 106 mg/dL — ABNORMAL HIGH (ref 70–99)
Sodium: 143 mmol/L (ref 134–144)
eGFR: 43 mL/min/{1.73_m2} — ABNORMAL LOW (ref >59)

## 2022-10-31 LAB — MICROALBUMIN / CREATININE URINE RATIO
Creatinine,U: 108.7 mg/dL
Microalb Creat Ratio: 5.3 mg/g (ref 0.0–30.0)
Microalb, Ur: 5.8 mg/dL — ABNORMAL HIGH (ref 0.0–1.9)

## 2022-10-31 LAB — HEMOGLOBIN A1C: Hgb A1c MFr Bld: 5.4 % (ref 4.6–6.5)

## 2022-10-31 LAB — LIPID PANEL
Cholesterol: 78 mg/dL (ref 0–200)
HDL: 32.3 mg/dL — ABNORMAL LOW (ref >39.00)
LDL Cholesterol: 33 mg/dL (ref 0–99)
NonHDL: 45.86
Total CHOL/HDL Ratio: 2
Triglycerides: 65 mg/dL (ref 0.0–149.0)
VLDL: 13 mg/dL (ref 0.0–40.0)

## 2022-10-31 NOTE — Assessment & Plan Note (Signed)
Urology plans work-up for this.

## 2022-10-31 NOTE — Assessment & Plan Note (Signed)
Discussed driving safety with Mr. Korn and his daughter. We discussed him considering not renewing his license in Oct. I urged him to consider stopping prior to a time when he might have an accident. as he has not had any known accidents at this point, I will continue to urge voluntarily stopping vs. submitting a request to [facility].

## 2022-10-31 NOTE — Assessment & Plan Note (Addendum)
Compensated. Plan to continue carvedilol 3.125 mg bid and Lasix 40 mg q am, 20 mg q noon.

## 2022-10-31 NOTE — Assessment & Plan Note (Signed)
Continue to focus on blood sugar control.

## 2022-10-31 NOTE — Progress Notes (Signed)
[facility] [address] [place] [address] Dept: [phone] Dept Fax: [phone]  Chronic Care Office Visit  Subjective:    Patient ID: Shawn Blanchard, male    DOB: [birth year], 87 y.o..   MRN: [id number]  Chief Complaint  Patient presents with   Medical Management of Chronic Issues    3 month f/u.  Fasting today.    History of Present Illness:  Patient is in today for reassessment of chronic medical issues.  [name] has an extensive history of CV disease. He had a prior heart attack and is s/p PTCA with stent placement. He has chronic a. fib., HFrEF due to hypertensive heart disease, and pulmonary hypertension. He is currently managed on carvedilol 3.125 mg bid and Lasix. 40 mg q am and 20 mg at noon. He is also on dabigatran (Pradaxa) 150 mg bid for anticoagulation. He has hyperlipidemia, but is intolerant of statins, so is managed on Repatha. His weight has been up mildly recently, but he denies any shortness of breath.   [name] has a history of Type 2 diabetes. He is managed on sitagliptin/metformin (Janumet XR) [phone] mg daily and metformin 500 mg q pm. His diabetes is complicated with peripheral neuropathy and chronic kidney disease. he recently has been seeing urology, who plans a work-up regarding his hematuria.  [name] family had reached out to me regarding concerns about [name] continuing to drive. He has not had any particular incident, but they are concerned about his neuropathy affect his leg sensaiton and strength. he has had occasional falls at home where he was not able to get up on his own. [name] notes he only rarely drives now. He notes he has never had an issue when sitting. he worries he might have a situation where he needed to drive in an emergency and wants to have that option. He admits that he may reach a time where he has to stop driving. He is unsure of how he might  determine when that time is.  Past Medical History: Patient Active Problem List   Diagnosis Date Noted   Scrotal mass, right 10/30/2022   Microscopic hematuria 10/30/2022   BPH with obstruction/lower urinary tract symptoms 10/30/2022   Tinea cruris 10/30/2022   Hydrocele 08/29/2022   Pain in both testicles 08/15/2022   Bronchitis 07/25/2022   Henoch-Schonlein purpura (HCC) 01/11/2022   Ulcer of left foot with fat layer exposed (HCC)    Disorder of sesamoid bone of foot    Gout of left foot    Diabetic foot infection (HCC) 08/08/2021   Class 1 obesity 08/08/2021   Aortic atherosclerosis (HCC) 06/22/2021   SBO (small bowel obstruction) (HCC) 05/17/2021   Hypertensive urgency 05/17/2021   UTI (urinary tract infection) 05/17/2021   Normocytic anemia 05/17/2021   History of lymphoma 05/17/2021   Iron deficiency anemia 04/27/2021   History of squamous cell carcinoma in situ 04/18/2021   Statin intolerance 03/09/2021   Peritoneal adhesion 11/08/2020   Peripheral venous insufficiency 11/08/2020   Type 2 diabetes mellitus with stage 3a chronic kidney disease, without long-term current use of insulin (HCC) 02/22/2020   Old MI (myocardial infarction)    Floppy eyelid syndrome of both eyes 10/05/2019   Sensorineural hearing loss (SNHL), bilateral 10/04/2019   Stage 3a chronic kidney disease (HCC) 06/13/2019   Carpal tunnel syndrome of right wrist 03/25/2019   Diabetic polyneuropathy associated with type 2 diabetes mellitus (HCC) 12/16/2018   Blepharitis  of both upper and lower eyelid 10/02/2018   Meibomian gland dysfunction (MGD) of both eyes 10/02/2018   Essential hypertension 11/07/2017   Acute on chronic systolic CHF (congestive heart failure) (HCC) 11/07/2017   Orthostatic dizziness 09/15/2017   Dermatochalasis of both upper eyelids 11/21/2016   Chronic anticoagulation 10/11/2016   Male erectile disorder 06/17/2016   Hyperlipidemia 04/11/2016   Small bowel obstruction (HCC)  04/11/2016   BMI 35.0-35.9,adult 04/11/2016   Leukocytosis 04/11/2016   Arthritis 03/12/2016   Pseudophakia of both eyes 03/12/2016   Insomnia 11/27/2015   Ganglion cyst 11/27/2015   Chronic diastolic heart failure (HCC) 09/01/2015   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   Obstructive sleep apnea 08/30/2015   Pulmonary hypertension (HCC) 08/30/2015   Restless leg syndrome 08/30/2015   Chronic atrial fibrillation (HCC) 06/12/2015   Gastroesophageal reflux disease without esophagitis 06/12/2015   Idiopathic chronic gout 05/17/2015   Coronary artery disease involving native coronary artery of native heart with angina pectoris (HCC) 02/15/2015   Hypertensive heart disease with heart failure (HCC) 02/15/2015   Type 2 diabetes mellitus with peripheral neuropathy (HCC) 12/02/2014   Past Surgical History:  Procedure Laterality Date   ABDOMINAL ADHESION SURGERY     x 2   BLEPHAROPLASTY Bilateral    CARPAL TUNNEL RELEASE Right 2021   CATARACT EXTRACTION Bilateral    CERVICAL SPINE SURGERY     CHOLECYSTECTOMY     CORONARY ANGIOPLASTY WITH STENT PLACEMENT  2000   IRRIGATION AND DEBRIDEMENT FOOT Left 08/11/2021   Procedure: IRRIGATION AND DEBRIDEMENT FOOT, AND BONE BIOPSY, PARTIAL SESMOIDECTOMY;  Surgeon: Edwin Cap, DPM;  Location: [facility];  Service: Podiatry;  Laterality: Left;   NASAL SINUS SURGERY     ORIF TIBIAL SHAFT FRACTURE W/ PLATES AND SCREWS Left    PARTIAL KNEE ARTHROPLASTY Bilateral    SMALL INTESTINE SURGERY     Adenocarcinoma   TONSILLECTOMY     Family History  Problem Relation Age of Onset   Cancer Mother        Ovarian   Diabetes Mother    Heart disease Mother    Heart disease Father    Stroke Father    Diabetes Father    Diabetes Sister    Diabetes Brother    Diabetes Brother    Cancer Paternal Grandfather        Prostate   Colon cancer Neg Hx    Rectal cancer Neg Hx    Pancreatic cancer Neg Hx     Liver cancer Neg Hx    Stomach cancer Neg Hx    Outpatient Medications Prior to Visit  Medication Sig Dispense Refill   albuterol (VENTOLIN HFA) 108 (90 Base) MCG/ACT inhaler Inhale 2 puffs into the lungs every 6 (six) hours as needed for wheezing or shortness of breath. 8 g 2   allopurinol (ZYLOPRIM) 100 MG tablet TAKE 1 TABLET BY MOUTH EVERY DAY 90 tablet 3   Alpha-Lipoic Acid 600 MG TABS Take 600 mg by mouth in the morning and at bedtime.     b complex vitamins capsule Take 1 capsule by mouth daily.     carvedilol (COREG) 3.125 MG tablet Take 1 tablet (3.125 mg total) by mouth 2 (two) times daily with a meal. 180 tablet 3   Cyanocobalamin (B-12) 1000 MCG SUBL Place 1,000 mg under the tongue.     dabigatran (PRADAXA) 150 MG CAPS capsule Take 1 capsule (150 mg total) by mouth 2 (  two) times daily. 180 capsule 1   FEROSUL 325 (65 Fe) MG tablet TAKE 1 TABLET BY MOUTH 2 TIMES A DAY 60 tablet 2   furosemide (LASIX) 20 MG tablet Take 2 tablets (40 mg total) by mouth daily with breakfast AND 1 tablet (20 mg total) daily before lunch. 270 tablet 3   glucose blood test strip OneTouch Ultra Blue Test Strip     hydrocortisone 2.5 % ointment Apply 1 application topically 2 (two) times daily as needed (Skin Irritation).     lactobacillus acidophilus (BACID) TABS tablet Take 1 tablet by mouth 3 (three) times daily.     Lancets (ONETOUCH DELICA PLUS LANCET33G) MISC OneTouch Delica Plus Lancet 33 gauge     loperamide (IMODIUM A-D) 2 MG tablet Take 2 mg by mouth 4 (four) times daily as needed for diarrhea or loose stools.     Melatonin 5 MG CAPS Take 5 mg by mouth at bedtime.     metFORMIN (GLUCOPHAGE-XR) 500 MG 24 hr tablet TAKE 2 TABLETS BY MOUTH EVERY DAY WITH DINNER (DIABETES) 180 tablet 1   nystatin cream (MYCOSTATIN) Apply 1 Application topically 2 (two) times daily. 30 g 1   OVER THE COUNTER MEDICATION Take 25 mg by mouth daily. Balance of Nature: Fruits and Vegs supplements     polyvinyl alcohol  (LIQUIFILM TEARS) 1.4 % ophthalmic solution Place 1 drop into both eyes as needed for dry eyes.     psyllium (METAMUCIL) 58.6 % packet Take 1 packet by mouth at bedtime. With a full glass of water     REPATHA SURECLICK 140 MG/ML SOAJ INJECT 140 MG INTO THE SKIN EVERY 14 DAYS 2 mL 6   SitaGLIPtin-MetFORMIN HCl (JANUMET XR) [phone] MG TB24 TAKE 1 TABLET BY MOUTH EVERY DAY WITH BREAKFAST (FOR DIABETES) 90 tablet 0   Spacer/Aero-Holding Chambers DEVI Use with albuterol inhaler 1 each 0   traMADol-acetaminophen (ULTRACET) 37.5-325 MG tablet Take by mouth.     No facility-administered medications prior to visit.   Allergies  Allergen Reactions   Ace Inhibitors Rash   Amoxicillin Nausea Only   Atorvastatin Other (See Comments)    Dizziness.   Jardiance [Empagliflozin] Other (See Comments)    Orthostasis   Meperidine Rash   Naproxen Sodium Rash   Sulfa Antibiotics Nausea Only   Tetracycline Nausea Only   Objective:   Today's Vitals   10/31/22 0756  BP: 118/66  Pulse: 87  Temp: 98.1 F (36.7 C)  TempSrc: Temporal  SpO2: 98%  Weight: 251 lb 12.8 oz (114.2 kg)  Height: 5\' 11"  (1.803 m)   Body mass index is 35.12 kg/m.   General: Well developed, well nourished. No acute distress. Lungs: Clear to auscultation bilaterally. No wheezing, rales or rhonchi. CV: RRR with a IV/VI holosystolic murmur. Pulses 2+ bilaterally. Extremities: 2+ edema noted. Skin: Warm and dry. No rashes. Psych: Alert and oriented. Normal mood and affect.  Health Maintenance Due  Topic Date Due   Diabetic kidney evaluation - Urine ACR  09/25/2022     Assessment & Plan:   Problem List Items Addressed This Visit       Cardiovascular and Mediastinum   Chronic atrial fibrillation (HCC)    Rate controlled.  Continue dabigatran (Pradaxa) 150 mg bid.      Chronic diastolic heart failure (HCC) - Primary    Compensated. Plan to continue carvedilol 3.125 mg bid and Lasix 40 mg q am, 20 mg q noon.       Essential hypertension  Blood pressure is in good control. Continue carvedilol 3.125 mg bid.        Endocrine   Diabetic polyneuropathy associated with type 2 diabetes mellitus (HCC)    Continue to focus on blood sugar control.      Type 2 diabetes mellitus with peripheral neuropathy (HCC)    We will check an A1c and micral today. Continue sitagliptin/metformin (Janumet XR) [phone] mg daily and metformin 500 mg q pm.      Relevant Orders   Hemoglobin A1c (Completed)   Microalbumin / creatinine urine ratio (Completed)     Genitourinary   Microscopic hematuria    Urology plans work-up for this.        Other   Driving safety issue    Discussed driving safety with Mr. Lando and his daughter. We discussed him considering not renewing his license in Oct. I urged him to consider stopping prior to a time when he might have an accident. as he has not had any known accidents at this point, I will continue to urge voluntarily stopping vs. submitting a request to [facility].      Hyperlipidemia   Relevant Orders   Lipid panel (Completed)    Return in about 3 months (around 01/31/2023) for Reassessment.   Loyola Mast, MD

## 2022-10-31 NOTE — Assessment & Plan Note (Signed)
Rate controlled.  Continue dabigatran (Pradaxa) 150 mg bid.

## 2022-10-31 NOTE — Assessment & Plan Note (Signed)
Blood pressure is in good control. Continue carvedilol 3.125 mg bid.

## 2022-10-31 NOTE — Assessment & Plan Note (Signed)
We will check an A1c and micral today. Continue sitagliptin/metformin (Janumet XR) [phone] mg daily and metformin 500 mg q pm.

## 2022-11-02 ENCOUNTER — Encounter: Payer: Self-pay | Admitting: Urology

## 2022-11-06 ENCOUNTER — Other Ambulatory Visit: Payer: Self-pay

## 2022-11-06 ENCOUNTER — Inpatient Hospital Stay (HOSPITAL_BASED_OUTPATIENT_CLINIC_OR_DEPARTMENT_OTHER)
Admission: EM | Admit: 2022-11-06 | Discharge: 2022-12-08 | DRG: 308 | Disposition: E | Payer: Medicare PPO | Attending: Internal Medicine | Admitting: Internal Medicine

## 2022-11-06 ENCOUNTER — Other Ambulatory Visit (HOSPITAL_COMMUNITY): Payer: Medicare PPO

## 2022-11-06 ENCOUNTER — Inpatient Hospital Stay (HOSPITAL_COMMUNITY): Payer: Medicare PPO

## 2022-11-06 ENCOUNTER — Encounter: Payer: Self-pay | Admitting: Family Medicine

## 2022-11-06 ENCOUNTER — Encounter (HOSPITAL_BASED_OUTPATIENT_CLINIC_OR_DEPARTMENT_OTHER): Payer: Self-pay

## 2022-11-06 ENCOUNTER — Ambulatory Visit (HOSPITAL_BASED_OUTPATIENT_CLINIC_OR_DEPARTMENT_OTHER)
Admission: RE | Admit: 2022-11-06 | Discharge: 2022-11-06 | Disposition: A | Discharge status: Home / Self Care | Payer: Medicare PPO | Source: Ambulatory Visit | Attending: Urology | Admitting: Urology

## 2022-11-06 ENCOUNTER — Emergency Department (HOSPITAL_BASED_OUTPATIENT_CLINIC_OR_DEPARTMENT_OTHER): Payer: Medicare PPO

## 2022-11-06 DIAGNOSIS — Z9221 Personal history of antineoplastic chemotherapy: Secondary | ICD-10-CM

## 2022-11-06 DIAGNOSIS — J9601 Acute respiratory failure with hypoxia: Secondary | ICD-10-CM | POA: Diagnosis not present

## 2022-11-06 DIAGNOSIS — I469 Cardiac arrest, cause unspecified: Secondary | ICD-10-CM

## 2022-11-06 DIAGNOSIS — H903 Sensorineural hearing loss, bilateral: Secondary | ICD-10-CM | POA: Diagnosis present

## 2022-11-06 DIAGNOSIS — Z6835 Body mass index (BMI) 35.0-35.9, adult: Secondary | ICD-10-CM

## 2022-11-06 DIAGNOSIS — I2510 Atherosclerotic heart disease of native coronary artery without angina pectoris: Secondary | ICD-10-CM | POA: Diagnosis present

## 2022-11-06 DIAGNOSIS — R188 Other ascites: Secondary | ICD-10-CM | POA: Diagnosis not present

## 2022-11-06 DIAGNOSIS — G4733 Obstructive sleep apnea (adult) (pediatric): Secondary | ICD-10-CM | POA: Diagnosis not present

## 2022-11-06 DIAGNOSIS — E872 Acidosis, unspecified: Secondary | ICD-10-CM | POA: Diagnosis present

## 2022-11-06 DIAGNOSIS — I4901 Ventricular fibrillation: Secondary | ICD-10-CM | POA: Diagnosis not present

## 2022-11-06 DIAGNOSIS — I4821 Permanent atrial fibrillation: Secondary | ICD-10-CM | POA: Diagnosis present

## 2022-11-06 DIAGNOSIS — I4892 Unspecified atrial flutter: Secondary | ICD-10-CM | POA: Diagnosis present

## 2022-11-06 DIAGNOSIS — I5043 Acute on chronic combined systolic (congestive) and diastolic (congestive) heart failure: Secondary | ICD-10-CM | POA: Diagnosis not present

## 2022-11-06 DIAGNOSIS — I252 Old myocardial infarction: Secondary | ICD-10-CM

## 2022-11-06 DIAGNOSIS — M109 Gout, unspecified: Secondary | ICD-10-CM | POA: Diagnosis present

## 2022-11-06 DIAGNOSIS — I5023 Acute on chronic systolic (congestive) heart failure: Secondary | ICD-10-CM | POA: Diagnosis not present

## 2022-11-06 DIAGNOSIS — E669 Obesity, unspecified: Secondary | ICD-10-CM | POA: Diagnosis present

## 2022-11-06 DIAGNOSIS — Z882 Allergy status to sulfonamides status: Secondary | ICD-10-CM

## 2022-11-06 DIAGNOSIS — Z880 Allergy status to penicillin: Secondary | ICD-10-CM

## 2022-11-06 DIAGNOSIS — I071 Rheumatic tricuspid insufficiency: Secondary | ICD-10-CM | POA: Diagnosis present

## 2022-11-06 DIAGNOSIS — R3129 Other microscopic hematuria: Secondary | ICD-10-CM | POA: Insufficient documentation

## 2022-11-06 DIAGNOSIS — N179 Acute kidney failure, unspecified: Secondary | ICD-10-CM

## 2022-11-06 DIAGNOSIS — Z888 Allergy status to other drugs, medicaments and biological substances status: Secondary | ICD-10-CM

## 2022-11-06 DIAGNOSIS — I472 Ventricular tachycardia, unspecified: Secondary | ICD-10-CM | POA: Diagnosis not present

## 2022-11-06 DIAGNOSIS — E1142 Type 2 diabetes mellitus with diabetic polyneuropathy: Secondary | ICD-10-CM | POA: Diagnosis not present

## 2022-11-06 DIAGNOSIS — Z7984 Long term (current) use of oral hypoglycemic drugs: Secondary | ICD-10-CM

## 2022-11-06 DIAGNOSIS — N170 Acute kidney failure with tubular necrosis: Secondary | ICD-10-CM | POA: Diagnosis not present

## 2022-11-06 DIAGNOSIS — K4090 Unilateral inguinal hernia, without obstruction or gangrene, not specified as recurrent: Secondary | ICD-10-CM | POA: Diagnosis not present

## 2022-11-06 DIAGNOSIS — I454 Nonspecific intraventricular block: Secondary | ICD-10-CM | POA: Diagnosis present

## 2022-11-06 DIAGNOSIS — N400 Enlarged prostate without lower urinary tract symptoms: Secondary | ICD-10-CM | POA: Diagnosis present

## 2022-11-06 DIAGNOSIS — Z833 Family history of diabetes mellitus: Secondary | ICD-10-CM

## 2022-11-06 DIAGNOSIS — I2489 Other forms of acute ischemic heart disease: Secondary | ICD-10-CM | POA: Diagnosis present

## 2022-11-06 DIAGNOSIS — Z96653 Presence of artificial knee joint, bilateral: Secondary | ICD-10-CM | POA: Diagnosis present

## 2022-11-06 DIAGNOSIS — R54 Age-related physical debility: Secondary | ICD-10-CM | POA: Diagnosis present

## 2022-11-06 DIAGNOSIS — Y658 Other specified misadventures during surgical and medical care: Secondary | ICD-10-CM | POA: Diagnosis present

## 2022-11-06 DIAGNOSIS — J90 Pleural effusion, not elsewhere classified: Secondary | ICD-10-CM | POA: Diagnosis not present

## 2022-11-06 DIAGNOSIS — Z955 Presence of coronary angioplasty implant and graft: Secondary | ICD-10-CM

## 2022-11-06 DIAGNOSIS — K219 Gastro-esophageal reflux disease without esophagitis: Secondary | ICD-10-CM | POA: Diagnosis present

## 2022-11-06 DIAGNOSIS — R609 Edema, unspecified: Secondary | ICD-10-CM | POA: Diagnosis not present

## 2022-11-06 DIAGNOSIS — I1310 Hypertensive heart and chronic kidney disease without heart failure, with stage 1 through stage 4 chronic kidney disease, or unspecified chronic kidney disease: Secondary | ICD-10-CM | POA: Diagnosis not present

## 2022-11-06 DIAGNOSIS — K5730 Diverticulosis of large intestine without perforation or abscess without bleeding: Secondary | ICD-10-CM | POA: Diagnosis not present

## 2022-11-06 DIAGNOSIS — Z1152 Encounter for screening for COVID-19: Secondary | ICD-10-CM

## 2022-11-06 DIAGNOSIS — Z7902 Long term (current) use of antithrombotics/antiplatelets: Secondary | ICD-10-CM

## 2022-11-06 DIAGNOSIS — I517 Cardiomegaly: Secondary | ICD-10-CM | POA: Diagnosis not present

## 2022-11-06 DIAGNOSIS — Z4682 Encounter for fitting and adjustment of non-vascular catheter: Secondary | ICD-10-CM | POA: Diagnosis not present

## 2022-11-06 DIAGNOSIS — Z809 Family history of malignant neoplasm, unspecified: Secondary | ICD-10-CM

## 2022-11-06 DIAGNOSIS — T8383XA Hemorrhage of genitourinary prosthetic devices, implants and grafts, initial encounter: Secondary | ICD-10-CM | POA: Diagnosis present

## 2022-11-06 DIAGNOSIS — I5042 Chronic combined systolic (congestive) and diastolic (congestive) heart failure: Secondary | ICD-10-CM

## 2022-11-06 DIAGNOSIS — I468 Cardiac arrest due to other underlying condition: Secondary | ICD-10-CM | POA: Diagnosis present

## 2022-11-06 DIAGNOSIS — I459 Conduction disorder, unspecified: Secondary | ICD-10-CM | POA: Diagnosis present

## 2022-11-06 DIAGNOSIS — I272 Pulmonary hypertension, unspecified: Secondary | ICD-10-CM | POA: Diagnosis not present

## 2022-11-06 DIAGNOSIS — Z823 Family history of stroke: Secondary | ICD-10-CM

## 2022-11-06 DIAGNOSIS — Z85068 Personal history of other malignant neoplasm of small intestine: Secondary | ICD-10-CM

## 2022-11-06 DIAGNOSIS — Z79899 Other long term (current) drug therapy: Secondary | ICD-10-CM

## 2022-11-06 DIAGNOSIS — R000 Tachycardia, unspecified: Secondary | ICD-10-CM

## 2022-11-06 DIAGNOSIS — Z8249 Family history of ischemic heart disease and other diseases of the circulatory system: Secondary | ICD-10-CM

## 2022-11-06 DIAGNOSIS — K761 Chronic passive congestion of liver: Secondary | ICD-10-CM | POA: Diagnosis not present

## 2022-11-06 LAB — URINE CULTURE

## 2022-11-06 LAB — CBC
HCT: 37.2 % — ABNORMAL LOW (ref 39.0–52.0)
Hemoglobin: 11.6 g/dL — ABNORMAL LOW (ref 13.0–17.0)
MCH: 29 pg (ref 26.0–34.0)
MCHC: 31.2 g/dL (ref 30.0–36.0)
MCV: 93 fL (ref 80.0–100.0)
Platelets: 237 10*3/uL (ref 150–400)
RBC: 4 MIL/uL — ABNORMAL LOW (ref 4.22–5.81)
RDW: 15.7 % — ABNORMAL HIGH (ref 11.5–15.5)
WBC: 13.8 10*3/uL — ABNORMAL HIGH (ref 4.0–10.5)
nRBC: 0 % (ref 0.0–0.2)

## 2022-11-06 LAB — URINALYSIS, W/ REFLEX TO CULTURE (INFECTION SUSPECTED)
Glucose, UA: 100 mg/dL — AB
Ketones, ur: NEGATIVE mg/dL
Leukocytes,Ua: NEGATIVE
Nitrite: NEGATIVE
Protein, ur: >=300 mg/dL — AB
RBC / HPF: >50 RBC/hpf (ref 0–5)
Specific Gravity, Urine: 1.02 (ref 1.005–1.030)
WBC, UA: >50 WBC/hpf (ref 0–5)
pH: 6.5 (ref 5.0–8.0)

## 2022-11-06 LAB — CBC WITH DIFFERENTIAL/PLATELET
Abs Immature Granulocytes: 0.11 10*3/uL — ABNORMAL HIGH (ref 0.00–0.07)
Basophils Absolute: 0.1 10*3/uL (ref 0.0–0.1)
Basophils Relative: 1 %
Eosinophils Absolute: 0.3 10*3/uL (ref 0.0–0.5)
Eosinophils Relative: 3 %
HCT: 39 % (ref 39.0–52.0)
Hemoglobin: 12.2 g/dL — ABNORMAL LOW (ref 13.0–17.0)
Immature Granulocytes: 1 %
Lymphocytes Relative: 41 %
Lymphs Abs: 4.6 10*3/uL — ABNORMAL HIGH (ref 0.7–4.0)
MCH: 29.1 pg (ref 26.0–34.0)
MCHC: 31.3 g/dL (ref 30.0–36.0)
MCV: 93.1 fL (ref 80.0–100.0)
Monocytes Absolute: 0.8 10*3/uL (ref 0.1–1.0)
Monocytes Relative: 7 %
Neutro Abs: 5.2 10*3/uL (ref 1.7–7.7)
Neutrophils Relative %: 47 %
Platelets: 178 10*3/uL (ref 150–400)
RBC: 4.19 MIL/uL — ABNORMAL LOW (ref 4.22–5.81)
RDW: 15.9 % — ABNORMAL HIGH (ref 11.5–15.5)
WBC: 11.1 10*3/uL — ABNORMAL HIGH (ref 4.0–10.5)
nRBC: 0.2 % (ref 0.0–0.2)

## 2022-11-06 LAB — POCT I-STAT 7, (LYTES, BLD GAS, ICA,H+H)
Acid-base deficit: 2 mmol/L (ref 0.0–2.0)
Bicarbonate: 22 mmol/L (ref 20.0–28.0)
Calcium, Ion: 1.32 mmol/L (ref 1.15–1.40)
HCT: 36 % — ABNORMAL LOW (ref 39.0–52.0)
Hemoglobin: 12.2 g/dL — ABNORMAL LOW (ref 13.0–17.0)
O2 Saturation: 100 %
Patient temperature: 98
Potassium: 4.1 mmol/L (ref 3.5–5.1)
Sodium: 138 mmol/L (ref 135–145)
TCO2: 23 mmol/L (ref 22–32)
pCO2 arterial: 33.9 mmHg (ref 32–48)
pH, Arterial: 7.42 (ref 7.35–7.45)
pO2, Arterial: 203 mmHg — ABNORMAL HIGH (ref 83–108)

## 2022-11-06 LAB — COMPREHENSIVE METABOLIC PANEL
ALT: 13 U/L (ref 0–44)
AST: 28 U/L (ref 15–41)
Albumin: 3.7 g/dL (ref 3.5–5.0)
Alkaline Phosphatase: 79 U/L (ref 38–126)
Anion gap: 12 (ref 5–15)
BUN: 33 mg/dL — ABNORMAL HIGH (ref 8–23)
CO2: 22 mmol/L (ref 22–32)
Calcium: 8.7 mg/dL — ABNORMAL LOW (ref 8.9–10.3)
Chloride: 103 mmol/L (ref 98–111)
Creatinine, Ser: 1.6 mg/dL — ABNORMAL HIGH (ref 0.61–1.24)
GFR, Estimated: 42 mL/min — ABNORMAL LOW (ref >60)
Glucose, Bld: 148 mg/dL — ABNORMAL HIGH (ref 70–99)
Potassium: 3.8 mmol/L (ref 3.5–5.1)
Sodium: 137 mmol/L (ref 135–145)
Total Bilirubin: 0.9 mg/dL (ref 0.3–1.2)
Total Protein: 6.8 g/dL (ref 6.5–8.1)

## 2022-11-06 LAB — LACTIC ACID, PLASMA: Lactic Acid, Venous: 3.9 mmol/L (ref 0.5–1.9)

## 2022-11-06 LAB — I-STAT VENOUS BLOOD GAS, ED
Acid-base deficit: 1 mmol/L (ref 0.0–2.0)
Bicarbonate: 27.1 mmol/L (ref 20.0–28.0)
Calcium, Ion: 1.41 mmol/L — ABNORMAL HIGH (ref 1.15–1.40)
HCT: 37 % — ABNORMAL LOW (ref 39.0–52.0)
Hemoglobin: 12.6 g/dL — ABNORMAL LOW (ref 13.0–17.0)
O2 Saturation: 82 %
Potassium: 3.4 mmol/L — ABNORMAL LOW (ref 3.5–5.1)
Sodium: 143 mmol/L (ref 135–145)
TCO2: 29 mmol/L (ref 22–32)
pCO2, Ven: 57.6 mmHg (ref 44–60)
pH, Ven: 7.281 (ref 7.25–7.43)
pO2, Ven: 54 mmHg — ABNORMAL HIGH (ref 32–45)

## 2022-11-06 LAB — GLUCOSE, CAPILLARY
Glucose-Capillary: 103 mg/dL — ABNORMAL HIGH (ref 70–99)
Glucose-Capillary: 125 mg/dL — ABNORMAL HIGH (ref 70–99)

## 2022-11-06 LAB — I-STAT CHEM 8, ED
BUN: 36 mg/dL — ABNORMAL HIGH (ref 8–23)
Calcium, Ion: 1.35 mmol/L (ref 1.15–1.40)
Chloride: 105 mmol/L (ref 98–111)
Creatinine, Ser: 1.4 mg/dL — ABNORMAL HIGH (ref 0.61–1.24)
Glucose, Bld: 131 mg/dL — ABNORMAL HIGH (ref 70–99)
HCT: 37 % — ABNORMAL LOW (ref 39.0–52.0)
Hemoglobin: 12.6 g/dL — ABNORMAL LOW (ref 13.0–17.0)
Potassium: 3.3 mmol/L — ABNORMAL LOW (ref 3.5–5.1)
Sodium: 143 mmol/L (ref 135–145)
TCO2: 26 mmol/L (ref 22–32)

## 2022-11-06 LAB — BRAIN NATRIURETIC PEPTIDE: B Natriuretic Peptide: 840.3 pg/mL — ABNORMAL HIGH (ref 0.0–100.0)

## 2022-11-06 LAB — MAGNESIUM: Magnesium: 2.1 mg/dL (ref 1.7–2.4)

## 2022-11-06 LAB — TROPONIN I (HIGH SENSITIVITY)
Troponin I (High Sensitivity): 33 ng/L — ABNORMAL HIGH (ref <18)
Troponin I (High Sensitivity): 148 ng/L (ref <18)
Troponin I (High Sensitivity): 500 ng/L (ref <18)

## 2022-11-06 LAB — CREATININE, SERUM
Creatinine, Ser: 1.77 mg/dL — ABNORMAL HIGH (ref 0.61–1.24)
GFR, Estimated: 37 mL/min — ABNORMAL LOW (ref >60)

## 2022-11-06 LAB — SARS CORONAVIRUS 2 BY RT PCR: SARS Coronavirus 2 by RT PCR: NEGATIVE

## 2022-11-06 MED ORDER — PROPOFOL 1000 MG/100ML IV EMUL: 5.0000 ug/kg/min | INTRAVENOUS | Status: DC

## 2022-11-06 MED ORDER — POLYETHYLENE GLYCOL 3350 17 G PO PACK
17.0000 g | PACK | Freq: Every day | ORAL | Status: DC
  Administered 2022-11-06: 17 g
  Filled 2022-11-06: qty 1

## 2022-11-06 MED ORDER — OXYCODONE HCL 5 MG PO TABS
5.0000 mg | ORAL_TABLET | Freq: Four times a day (QID) | ORAL | Status: DC
  Administered 2022-11-07 – 2022-11-08 (×4): 5 mg via ORAL
  Filled 2022-11-06 (×4): qty 1

## 2022-11-06 MED ORDER — PIPERACILLIN-TAZOBACTAM 3.375 G IVPB 30 MIN: 3.3750 g | Freq: Once | INTRAVENOUS | Status: DC

## 2022-11-06 MED ORDER — HEPARIN SODIUM (PORCINE) 5000 UNIT/ML IJ SOLN
5000.0000 [IU] | Freq: Three times a day (TID) | INTRAMUSCULAR | Status: DC
  Administered 2022-11-06 – 2022-11-09 (×8): 5000 [IU] via SUBCUTANEOUS
  Filled 2022-11-06 (×8): qty 1

## 2022-11-06 MED ORDER — FENTANYL CITRATE PF 50 MCG/ML IJ SOSY: 25.0000 ug | PREFILLED_SYRINGE | INTRAMUSCULAR | Status: DC | PRN

## 2022-11-06 MED ORDER — ACETAMINOPHEN 325 MG PO TABS
650.0000 mg | ORAL_TABLET | Freq: Four times a day (QID) | ORAL | Status: DC
  Administered 2022-11-06 (×2): 650 mg
  Filled 2022-11-06 (×2): qty 2

## 2022-11-06 MED ORDER — ORAL CARE MOUTH RINSE: 15.0000 mL | OROMUCOSAL | Status: DC | PRN

## 2022-11-06 MED ORDER — PROPOFOL 1000 MG/100ML IV EMUL
INTRAVENOUS | Status: AC
  Administered 2022-11-06: 5 ug/kg/min via INTRAVENOUS
  Filled 2022-11-06: qty 100

## 2022-11-06 MED ORDER — INSULIN ASPART 100 UNIT/ML IJ SOLN
0.0000 [IU] | INTRAMUSCULAR | Status: DC
  Administered 2022-11-06 – 2022-11-09 (×6): 2 [IU] via SUBCUTANEOUS

## 2022-11-06 MED ORDER — FAMOTIDINE 20 MG PO TABS
20.0000 mg | ORAL_TABLET | Freq: Two times a day (BID) | ORAL | Status: DC
  Administered 2022-11-06 (×2): 20 mg
  Filled 2022-11-06 (×2): qty 1

## 2022-11-06 MED ORDER — IOHEXOL 300 MG/ML  SOLN
100.0000 mL | Freq: Once | INTRAMUSCULAR | Status: AC | PRN
  Administered 2022-11-06: 100 mL via INTRAVENOUS

## 2022-11-06 MED ORDER — SODIUM BICARBONATE 8.4 % IV SOLN
INTRAVENOUS | Status: AC | PRN
  Administered 2022-11-06 (×3): 100 meq via INTRAVENOUS

## 2022-11-06 MED ORDER — AMIODARONE HCL IN DEXTROSE 360-4.14 MG/200ML-% IV SOLN
30.0000 mg/h | INTRAVENOUS | Status: DC
  Administered 2022-11-07: 30 mg/h via INTRAVENOUS
  Filled 2022-11-06 (×2): qty 200

## 2022-11-06 MED ORDER — ASPIRIN 81 MG PO TBEC
81.0000 mg | DELAYED_RELEASE_TABLET | Freq: Every day | ORAL | Status: DC
  Filled 2022-11-06: qty 1

## 2022-11-06 MED ORDER — PROPOFOL 10 MG/ML IV BOLUS: INTRAVENOUS | Status: DC | PRN

## 2022-11-06 MED ORDER — POLYETHYLENE GLYCOL 3350 17 G PO PACK
17.0000 g | PACK | Freq: Every day | ORAL | Status: DC
  Administered 2022-11-07 – 2022-11-09 (×2): 17 g via ORAL
  Filled 2022-11-06 (×3): qty 1

## 2022-11-06 MED ORDER — EPINEPHRINE 1 MG/10ML IJ SOSY
PREFILLED_SYRINGE | INTRAMUSCULAR | Status: AC | PRN
Start: 2022-11-06 — End: 2022-11-06
  Administered 2022-11-06: 1 mg via INTRAVENOUS

## 2022-11-06 MED ORDER — ASPIRIN 81 MG PO TBEC
81.0000 mg | DELAYED_RELEASE_TABLET | Freq: Every day | ORAL | Status: DC
  Administered 2022-11-06 – 2022-11-07 (×2): 81 mg via ORAL
  Filled 2022-11-06: qty 1

## 2022-11-06 MED ORDER — NOREPINEPHRINE BITARTRATE 1 MG/ML IV SOLN: INTRAVENOUS | Status: DC | PRN

## 2022-11-06 MED ORDER — EPINEPHRINE 1 MG/10ML IJ SOSY
PREFILLED_SYRINGE | INTRAMUSCULAR | Status: AC | PRN
  Administered 2022-11-06 (×2): 1 mg via INTRAVENOUS

## 2022-11-06 MED ORDER — FAMOTIDINE 20 MG PO TABS: 20.0000 mg | ORAL_TABLET | Freq: Two times a day (BID) | ORAL | Status: DC

## 2022-11-06 MED ORDER — ETOMIDATE 2 MG/ML IV SOLN
INTRAVENOUS | Status: AC | PRN
  Administered 2022-11-06: 20 mg via INTRAVENOUS

## 2022-11-06 MED ORDER — DOCUSATE SODIUM 50 MG/5ML PO LIQD
100.0000 mg | Freq: Two times a day (BID) | ORAL | Status: DC
  Administered 2022-11-06 (×2): 100 mg
  Filled 2022-11-06 (×2): qty 10

## 2022-11-06 MED ORDER — PIPERACILLIN-TAZOBACTAM 3.375 G IVPB: 3.3750 g | Freq: Three times a day (TID) | INTRAVENOUS | Status: DC

## 2022-11-06 MED ORDER — OXYCODONE HCL 5 MG PO TABS
5.0000 mg | ORAL_TABLET | Freq: Four times a day (QID) | ORAL | Status: DC
  Administered 2022-11-06 (×2): 5 mg
  Filled 2022-11-06 (×2): qty 1

## 2022-11-06 MED ORDER — NOREPINEPHRINE 4 MG/250ML-% IV SOLN: 0.0000 ug/min | INTRAVENOUS | Status: DC

## 2022-11-06 MED ORDER — FAMOTIDINE 20 MG PO TABS
20.0000 mg | ORAL_TABLET | Freq: Two times a day (BID) | ORAL | Status: DC
  Administered 2022-11-07 – 2022-11-08 (×2): 20 mg via ORAL
  Filled 2022-11-06 (×3): qty 1

## 2022-11-06 MED ORDER — NOREPINEPHRINE 4 MG/250ML-% IV SOLN
INTRAVENOUS | Status: AC
  Administered 2022-11-06: 5 ug/min via INTRAVENOUS
  Filled 2022-11-06: qty 250

## 2022-11-06 MED ORDER — DOCUSATE SODIUM 100 MG PO CAPS
100.0000 mg | ORAL_CAPSULE | Freq: Two times a day (BID) | ORAL | Status: DC
  Administered 2022-11-07 – 2022-11-09 (×3): 100 mg via ORAL
  Filled 2022-11-06 (×5): qty 1

## 2022-11-06 MED ORDER — ACETAMINOPHEN 325 MG PO TABS
650.0000 mg | ORAL_TABLET | Freq: Four times a day (QID) | ORAL | Status: DC
  Administered 2022-11-07 – 2022-11-09 (×8): 650 mg via ORAL
  Filled 2022-11-06 (×9): qty 2

## 2022-11-06 MED ORDER — AMIODARONE HCL 150 MG/3ML IV SOLN
INTRAVENOUS | Status: AC | PRN
  Administered 2022-11-06: 300 mg via INTRAVENOUS

## 2022-11-06 MED ORDER — AMIODARONE IV BOLUS ONLY 150 MG/100ML
INTRAVENOUS | Status: AC | PRN
  Administered 2022-11-06 (×2): 150 mg via INTRAVENOUS

## 2022-11-06 MED ORDER — ROCURONIUM BROMIDE 50 MG/5ML IV SOLN
INTRAVENOUS | Status: AC | PRN
  Administered 2022-11-06: 100 mg via INTRAVENOUS

## 2022-11-06 MED ORDER — FUROSEMIDE 10 MG/ML IJ SOLN
60.0000 mg | Freq: Once | INTRAMUSCULAR | Status: AC
  Administered 2022-11-06: 60 mg via INTRAVENOUS
  Filled 2022-11-06: qty 6

## 2022-11-06 MED ORDER — AMIODARONE IV BOLUS ONLY 150 MG/100ML
INTRAVENOUS | Status: AC
  Filled 2022-11-06: qty 100

## 2022-11-06 MED ORDER — CALCIUM CHLORIDE 10 % IV SOLN
INTRAVENOUS | Status: AC | PRN
  Administered 2022-11-06 (×2): 1 g via INTRAVENOUS

## 2022-11-06 MED ORDER — HEPARIN (PORCINE) 25000 UT/250ML-% IV SOLN: 1400.0000 [IU]/h | INTRAVENOUS | Status: DC

## 2022-11-06 NOTE — Consult Note (Addendum)
Cardiology Consultation   Patient ID: LANI BEAVERS MRN: [id number]; DOB: [birth year]  Admit date: 11/06/2022 Date of Consult: 11/06/2022  PCP:  Loyola Mast, MD   [facility] Providers Cardiologist:  Norman Herrlich, MD   {    Patient Profile:   Shawn Blanchard is a 87 y.o. 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  History of Present Illness:   Mr. Shawn Blanchard with above PMH who presented from [facility] as a transfer for further evaluation of PEA arrest.  Reportedly patient arrived for CT of his scrotum mass/hematuria today.  He suffered a PEA arrest on the table during CT imaging. Per ER provider note, he had multiple ROSC after repeated CPR. He was felt in wide complex rhythm concerning for VT and was cardioverted x2 (without improvement). He was also given amiodarone bolus and gtt and levophed gtt as well. He was intubated and placed on propofol for sedation. EKG was concerning for anterior lead ST elevation, this was reviewed with STEMI MD and felt no urgent need to cath lab. Cardiology is consulted now for further input.   Upon encounter, patient is intubated. He is awake, weaning off sedation, able to nod for communication, trying to talk and is breathing over the ventilator. He denied ever having any chest pain prior to this. He c/o chest wall tenderness when touched. He felt his exertional SOB had been overall stable.  His daughter Ms Shawn Blanchard is at bedside to assist history, who is also his power of attorney. Daughter states patient has chronic DOE as well as BLE edema, both seems stable, maybe overall progressed over the past 6 months. He is not active  baseline, has limited mobility due to SOB, low energy level, and fatigue. He has not been complaining much anything, tolerating regular PO intake, compliant with his medications including lasix, coreg, pradaxa. He was complaining he can't breath well when he was on the table during CT today when he was placed in prone position. He weighed himself everyday and it was stable 248ibs today. Daughter states patient had no recent fever, emesis, abdominal pain, diarrhea, etc.   Diagnostic workup so far showed compensated PH 7.28. Cr 1.6 /1.4. K 3.8 /3.4. EGFR 42. BNP 840. Hs trop 33 >148. Hgb 12.2 /12.6. WBC 11100. UA + Hgb, glucose, protein, bacteria. Lactic acid 3.9.   Per chart review, patient historically follows Dr. Dulce Blanchard, has CAD with PCI to LAD in 2000 at [facility] (no records available, per daughter reports).  Echo dating back to 12/16/2019 with LVEF 45 to 50%, hypokinetic globally, mild LVH, mildly reduced RV, trivial MR, mild AI, mild to moderate aortic sclerosis.  He has been maintained on medical therapy with carvedilol, Repatha.  He is intolerant to statin.  No antiplatelet therapy due to concurrent use of Pradaxa. He has permanent atrial fibrillation.  Last event monitor from 08/16/2020 showed 100% burden of atrial fibrillation, bundle branch block/IVCD was present, ventricular trigeminy.  He was rate controlled on carvedilol and anticoagulated on Pradaxa.  Most recent Echo from 08/09/2021 with LVEF stable at 45%-50%, global hypokinesis, moderate LVH, indeterminate diastolic parameter, normal RV, moderately elevated  PASP, mild to moderate tricuspid regurgitation.  He is currently on Lasix 40 mg daily.  He was last seen on 03/28/2022, mainly complaints of gait unsteadiness and fall, was felt stable from cardiovascular standpoint.  Patient is currently seeing urology Dr. Pete Blanchard for right scrotal mass and microscopic hematuria that was initially discovered 08/2022.  Symptom had some improvement with antibiotic.   Scrotal ultrasound from 08/22/2022 was concerning for complex fluid adjacent to the right testicle measuring 2.1 cm suggestive of infectious/inflammatory process or hematoma.  He was recommended CT hematuria protocol and cystoscopy on 10/30/2022.  He presented to the hospital today for CT imaging and had an unexpected PEA arrest.      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   Aortic atherosclerosis (HCC) 06/22/2021   Arthritis 03/12/2016   Blepharitis of both upper and lower eyelid 10/02/2018   BMI 35.0-35.9,adult 04/11/2016   Carpal tunnel syndrome of right wrist 03/25/2019   Chronic anticoagulation 10/11/2016   Chronic atrial fibrillation (HCC) 06/12/2015   Overview:  Managed CARDS   Chronic diastolic heart failure (HCC) 09/01/2015   Class 1 obesity 08/08/2021   Coronary artery disease involving native coronary artery of native heart with angina pectoris (HCC) 02/15/2015   Overview:  Hx of MI with stent to LAD 2002 MPS 2016 with EF 47% and no ischemia   Dermatochalasis of both upper eyelids 11/21/2016   Diabetic foot infection (HCC) 08/08/2021   Diabetic polyneuropathy associated with type 2 diabetes mellitus (HCC) 12/16/2018   2020   Disorder of sesamoid bone of foot    Essential hypertension 11/07/2017   Floppy eyelid syndrome of both eyes 10/05/2019   Ganglion cyst 11/27/2015   Overview:  2017: dorsum right hand   Gastroesophageal reflux disease without esophagitis 06/12/2015   Gout of left foot    History of lymphoma 05/17/2021   History of squamous cell carcinoma in situ 04/18/2021   Hyperlipidemia 04/11/2016   Hypertensive heart disease with heart failure (HCC) 02/15/2015   Hypertensive urgency 05/17/2021   Idiopathic chronic gout 05/17/2015   Insomnia 11/27/2015   Iron deficiency anemia 04/27/2021   Leukocytosis 04/11/2016   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HCC)  08/30/2015   Male erectile disorder 06/17/2016   Meibomian gland dysfunction (MGD) of both eyes 10/02/2018   Normocytic anemia 05/17/2021   Obstructive sleep apnea 08/30/2015   Overview:  CPAP   Old MI (myocardial infarction)    Orthostatic dizziness 09/15/2017   2018: med related   Peripheral venous insufficiency 11/08/2020   Peritoneal adhesion 11/08/2020   Pseudophakia of both eyes 03/12/2016   Pulmonary hypertension (HCC) 08/30/2015   Overview:  Managed CARDS   Restless leg syndrome 08/30/2015   SBO (small bowel obstruction) (HCC) 12/14/2019   Sensorineural hearing loss (SNHL), bilateral 10/04/2019   Small bowel obstruction (HCC) 04/11/2016   Stage 3a chronic kidney disease (HCC) 06/13/2019   Statin intolerance 03/09/2021   Type 2 diabetes mellitus with neurologic complication, without long-term current use of insulin (HCC) 12/02/2014   Type 2 diabetes mellitus with stage 3a chronic kidney disease, without long-term current use of insulin (HCC) 02/22/2020   Ulcer of left foot with fat layer exposed (HCC)    UTI (urinary tract infection) 05/17/2021    Past Surgical History:  Procedure Laterality Date   ABDOMINAL ADHESION SURGERY     x 2   BLEPHAROPLASTY Bilateral    CARPAL  TUNNEL RELEASE Right 2021   CATARACT EXTRACTION Bilateral    CERVICAL SPINE SURGERY     CHOLECYSTECTOMY     CORONARY ANGIOPLASTY WITH STENT PLACEMENT  2000   IRRIGATION AND DEBRIDEMENT FOOT Left 08/11/2021   Procedure: IRRIGATION AND DEBRIDEMENT FOOT, AND BONE BIOPSY, PARTIAL SESMOIDECTOMY;  Surgeon: Edwin Cap, DPM;  Location: [facility];  Service: Podiatry;  Laterality: Left;   NASAL SINUS SURGERY     ORIF TIBIAL SHAFT FRACTURE W/ PLATES AND SCREWS Left    PARTIAL KNEE ARTHROPLASTY Bilateral    SMALL INTESTINE SURGERY     Adenocarcinoma   TONSILLECTOMY       Home Medications:  Prior to Admission medications   Medication Sig Start Date End Date Taking? Authorizing Provider  albuterol (VENTOLIN HFA) 108 (90  Base) MCG/ACT inhaler Inhale 2 puffs into the lungs every 6 (six) hours as needed for wheezing or shortness of breath. 07/25/22  Yes Loyola Mast, MD  allopurinol (ZYLOPRIM) 100 MG tablet TAKE 1 TABLET BY MOUTH EVERY DAY 09/04/22  Yes Loyola Mast, MD  Alpha-Lipoic Acid 600 MG TABS Take 600 mg by mouth in the morning and at bedtime.   Yes [provider]  b complex vitamins capsule Take 1 capsule by mouth daily.   Yes [provider]  carvedilol (COREG) 3.125 MG tablet Take 1 tablet (3.125 mg total) by mouth 2 (two) times daily with a meal. 04/11/22  Yes Munley, Iline Oven, MD  Cyanocobalamin (B-12) 1000 MCG SUBL Place 1,000 mg under the tongue.   Yes [provider]  dabigatran (PRADAXA) 150 MG CAPS capsule Take 1 capsule (150 mg total) by mouth 2 (two) times daily. 09/18/22  Yes Baldo Daub, MD  FEROSUL 325 (65 Fe) MG tablet TAKE 1 TABLET BY MOUTH 2 TIMES A DAY Patient taking differently: Take 325 mg by mouth daily. 09/25/22  Yes Loyola Mast, MD  furosemide (LASIX) 20 MG tablet Take 2 tablets (40 mg total) by mouth daily with breakfast AND 1 tablet (20 mg total) daily before lunch. 04/26/22  Yes Loyola Mast, MD  hydrocortisone 2.5 % ointment Apply 1 application topically 2 (two) times daily as needed (Skin Irritation). 11/29/20  Yes [provider]  lactobacillus acidophilus (BACID) TABS tablet Take 1 tablet by mouth 3 (three) times daily.   Yes [provider]  loperamide (IMODIUM A-D) 2 MG tablet Take 2 mg by mouth 4 (four) times daily as needed for diarrhea or loose stools.   Yes [provider]  Melatonin 5 MG CAPS Take 5 mg by mouth at bedtime.   Yes [provider]  metFORMIN (GLUCOPHAGE-XR) 500 MG 24 hr tablet TAKE 2 TABLETS BY MOUTH EVERY DAY WITH DINNER (DIABETES) Patient taking differently: Take 500 mg by mouth every evening. 08/15/22  Yes Loyola Mast, MD  nystatin cream (MYCOSTATIN) Apply 1 Application topically 2  (two) times daily. Patient taking differently: Apply 1 Application topically 2 (two) times daily as needed for dry skin. 10/30/22  Yes Stoneking, Danford Bad., MD  OVER THE COUNTER MEDICATION Take 25 mg by mouth daily. Medication: Balance of Nature: Fruits and Vegs supplements   Yes [provider]  polyvinyl alcohol (LIQUIFILM TEARS) 1.4 % ophthalmic solution Place 1 drop into both eyes as needed for dry eyes.   Yes [provider]  psyllium (METAMUCIL) 58.6 % packet Take 1 packet by mouth at bedtime. With a full glass of water   Yes [provider]  REPATHA SURECLICK 140 MG/ML SOAJ INJECT 140 MG INTO THE SKIN EVERY 14 DAYS Patient taking differently: Inject 140 mg into the skin every 14 (fourteen) days. 08/28/22  Yes Baldo Daub, MD  SitaGLIPtin-MetFORMIN HCl (JANUMET XR) [phone] MG TB24 TAKE 1 TABLET BY MOUTH EVERY DAY WITH BREAKFAST (FOR DIABETES) Patient taking differently: Take 1 tablet by mouth daily. 05/16/22  Yes Loyola Mast, MD  glucose blood test strip OneTouch Ultra [facility]    [provider]  Lancets [facility] DELICA PLUS [place]) MISC OneTouch Brent Plus Lancet 33 gauge    [provider]  Spacer/Aero-Holding Rudean Curt Use with albuterol inhaler 08/13/21   Loyola Mast, MD  traMADol-acetaminophen (ULTRACET) 37.5-325 MG tablet Take by mouth. Patient not taking: Reported on 11/06/2022 08/15/22   [provider]    Inpatient Medications: Scheduled Meds:  acetaminophen  650 mg Per Tube Q6H   aspirin EC  81 mg Oral Daily   docusate  100 mg Per Tube BID   famotidine  20 mg Per Tube BID   furosemide  60 mg Intravenous Once   insulin aspart  0-15 Units Subcutaneous Q4H   oxyCODONE  5 mg Per Tube Q6H   polyethylene glycol  17 g Per Tube Daily   Continuous Infusions:  norepinephrine (LEVOPHED) Adult infusion 5 mcg/min (11/06/22 1600)   propofol (DIPRIVAN) infusion 5 mcg/kg/min (11/06/22 1600)   PRN  Meds: fentaNYL (SUBLIMAZE) injection, fentaNYL (SUBLIMAZE) injection  Allergies:    Allergies  Allergen Reactions   Ace Inhibitors Rash   Amoxicillin Nausea Only   Atorvastatin Other (See Comments)    Dizziness.   Jardiance [Empagliflozin] Other (See Comments)    Orthostasis   Meperidine Rash   Naproxen Sodium Rash   Sulfa Antibiotics Nausea Only   Tetracycline Nausea Only    Social History:   Social History   Socioeconomic History   Marital status: Married    Spouse name: Not on file   Number of children: 2   Years of education: Not on file   Highest education level: Not on file  Occupational History   Not on file  Tobacco Use   Smoking status: Never   Smokeless tobacco: Never  Vaping Use   Vaping status: Never Used  Substance and Sexual Activity   Alcohol use: Not Currently   Drug use: Never   Sexual activity: Yes  Other Topics Concern   Not on file  Social History Narrative   Former high school principal.   Social Determinants of Health   Financial Resource Strain: Low Risk  (06/12/2022)   Overall Financial Resource Strain (CARDIA)    Difficulty of Paying Living Expenses: Not hard at all  Food Insecurity: No Food Insecurity (03/19/2022)   Hunger Vital Sign    Worried About Running Out of Food in the Last Year: Never true    Ran Out of Food in the Last Year: Never true  Transportation Needs: No Transportation Needs (06/12/2022)   PRAPARE - Administrator, Civil Service (Medical): No    Lack of Transportation (Non-Medical): No  Physical Activity: Inactive (03/19/2022)   Exercise Vital Sign    Days of Exercise per Week: 0 days    Minutes of Exercise per Session: 0 min  Stress: No Stress Concern Present (03/19/2022)   [employer] of Occupational Health - Occupational Stress Questionnaire    Feeling of Stress : Not at all  Social Connections: Socially Integrated (03/07/2021)   Social Connection and  Isolation Panel [NHANES]    Frequency of  Communication with Friends and Family: Twice a week    Frequency of Social Gatherings with Friends and Family: Twice a week    Attends Religious Services: More than 4 times per year    Active Member of Clubs or Organizations: Yes    Attends Banker Meetings: More than 4 times per year    Marital Status: Married  Catering manager Violence: Not At Risk (03/07/2021)   Humiliation, Afraid, Rape, and Kick questionnaire    Fear of Current or Ex-Partner: No    Emotionally Abused: No    Physically Abused: No    Sexually Abused: No    Family History:    Family History  Problem Relation Age of Onset   Cancer Mother        Ovarian   Diabetes Mother    Heart disease Mother    Heart disease Father    Stroke Father    Diabetes Father    Diabetes Sister    Diabetes Brother    Diabetes Brother    Cancer Paternal Grandfather        Prostate   Colon cancer Neg Hx    Rectal cancer Neg Hx    Pancreatic cancer Neg Hx    Liver cancer Neg Hx    Stomach cancer Neg Hx      ROS:  Constitutional: Denied fever, chills, malaise, night sweats Eyes: Denied vision change or loss Ears/Nose/Mouth/Throat: Denied ear ache, sore throat, coughing, sinus pain Cardiovascular: see HPI  Respiratory: see HPI  Gastrointestinal: Denied nausea, vomiting, abdominal pain, diarrhea Genital/Urinary: Denied dysuria, hematuria, urinary frequency/urgency Musculoskeletal: Denied muscle ache, joint pain, weakness Skin: Denied rash, wound Neuro: Denied headache, dizziness, syncope Psych: Denied history of depression/anxiety  Endocrine: history of diabetes    Physical Exam/Data:   Vitals:   11/06/22 1545 11/06/22 1600 11/06/22 1615 11/06/22 1630  BP: (!) 128/55 (!) 121/56 (!) 112/49 (!) 116/56  Pulse: 69 64 61 64  Resp: (!) 21 (!) 21 16 (!) 8  Temp: (!) 96.8 F (36 C) (!) 97.2 F (36.2 C) (!) 97 F (36.1 C) (!) 97 F (36.1 C)  TempSrc:      SpO2: 100% 100% 100% 100%    Intake/Output Summary  (Last 24 hours) at 11/06/2022 1640 Last data filed at 11/06/2022 1600 Gross per 24 hour  Intake 223.47 ml  Output 225 ml  Net -1.53 ml      10/31/2022    7:56 AM 10/30/2022    9:53 AM 09/12/2022    2:10 PM  Last 3 Weights  Weight (lbs) 251 lb 12.8 oz 243 lb 246 lb  Weight (kg) 114.216 kg 110.224 kg 111.585 kg     There is no height or weight on file to calculate BMI.   Vitals:  Vitals:   11/06/22 1615 11/06/22 1630  BP: (!) 112/49 (!) 116/56  Pulse: 61 64  Resp: 16 (!) 8  Temp: (!) 97 F (36.1 C) (!) 97 F (36.1 C)  SpO2: 100% 100%   General Appearance: In no apparent distress, laying in bed, obese  HEENT: Normocephalic, atraumatic.  Neck: Unable assess JVD  Cardiovascular: Irregularly irregular, soft murmur noted , chest wall tenderness + Respiratory: on mechanical ventilator, lung sound clear Gastrointestinal: Bowel sounds positive, abdomen soft, obese Extremities: Able to move all extremities in bed without difficulty, 2+ pitting edema with venous stasis changes noted of BLE Musculoskeletal: Normal muscle bulk and tone Skin: Intact, warm,  dry. No rashes  Neurologic: Alert, oriented to person, place, limited speech due to ET tube in place, respond appropriately by nodding   Psychiatric: Normal affect. Mood is appropriate.    EKG:  The EKG was personally reviewed and demonstrates:    EKG 11/06/22 11:11: Atrial flutter with ventricular rate of 99bpm, non-specific IVCD, PVC  EKG 11/06/22 11:21: Atrial flutter RVR 145 bpm, non-specific IVCD  EKG from 11/06/22 13:48:  atrial flutter with variable AVB, ventricular rate of 80bpm, PVCs, non-specific IVCD  EKG overall appears similar   Telemetry:  Telemetry was personally reviewed and demonstrates:    Atrial fibrillation/flutter 70s , occasional PVCs   Relevant CV Studies:   Echo from 08/09/21:    1. Left ventricular ejection fraction, by estimation, is 45 to 50%. The  left ventricle has mildly decreased function. The  left ventricle  demonstrates global hypokinesis. There is moderate left ventricular  hypertrophy. Left ventricular diastolic  parameters are indeterminate.   2. Right ventricular systolic function is normal. The right ventricular  size is normal. There is moderately elevated pulmonary artery systolic  pressure.   3. The mitral valve was not well visualized. No evidence of mitral valve  regurgitation.   4. Tricuspid valve regurgitation is mild to moderate.   5. The aortic valve was not well visualized. Aortic valve regurgitation  is not visualized.   6. The inferior vena cava is dilated in size with >50% respiratory  variability, suggesting right atrial pressure of 8 mmHg.   Event monitor 6 days and 21 hours on 08/16/20:   Atrial Fibrillation occurred continuously (100% burden), ranging from 39-101 bpm (avg of 67 bpm). Bundle Branch Block/IVCD was present. Isolated VEs were occasional (1.4%, 8942), VE Couplets were rare (<1.0%, 19), and no VE Triplets were present.  Ventricular Trigeminy was present.   There were no triggered or symptomatic events. Interventricular conduction delay is noted. Good heart rate control with atrial fibrillation 5110 bpm 100% of the time during the day 99% at night and the slowest persistent atrial fibrillation at 66 bpm.   Conclusion atrial fibrillation throughout with well-controlled ventricular rate occasional ventricular ectopy.   Laboratory Data:  High Sensitivity Troponin:   Recent Labs  Lab 11/06/22 1111 11/06/22 1343  TROPONINIHS 33* 148*     Chemistry Recent Labs  Lab 11/06/22 1111 11/06/22 1129  NA 137 143  143  K 3.8 3.4*  3.3*  CL 103 105  CO2 22  --   GLUCOSE 148* 131*  BUN 33* 36*  CREATININE 1.60* 1.40*  CALCIUM 8.7*  --   MG 2.1  --   GFRNONAA 42*  --   ANIONGAP 12  --     Recent Labs  Lab 11/06/22 1111  PROT 6.8  ALBUMIN 3.7  AST 28  ALT 13  ALKPHOS 79  BILITOT 0.9   Lipids  Recent Labs  Lab 10/31/22 0840   CHOL 78  TRIG 65.0  HDL 32.30*  LDLCALC 33  CHOLHDL 2    Hematology Recent Labs  Lab 11/06/22 1111 11/06/22 1129  WBC 11.1*  --   RBC 4.19*  --   HGB 12.2* 12.6*  12.6*  HCT 39.0 37.0*  37.0*  MCV 93.1  --   MCH 29.1  --   MCHC 31.3  --   RDW 15.9*  --   PLT 178  --    Thyroid No results for input(s): "TSH", "FREET4" in the last 168 hours.  BNP Recent Labs  Lab 11/06/22  1121  BNP 840.3*    DDimer No results for input(s): "DDIMER" in the last 168 hours.   Radiology/Studies:  VAS [place] LOWER EXTREMITY VENOUS (DVT)  Result Date: 11/06/2022  Lower Venous DVT Study Patient Name:  JEREMAIAH DENNIS  Date of Exam:   11/06/2022 Medical Rec #: [id number]           Accession #:    [id number] Date of Birth: [birth year]          Patient Gender: M Patient Age:   23 years Exam Location:  [facility] Procedure:      VAS [place] LOWER EXTREMITY VENOUS (DVT) Referring Phys: Emilie Rutter --------------------------------------------------------------------------------  Indications: Edema.  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 Anticoagulation: Heparin. Comparison Study: No prior study Performing Technologist: Shona Simpson  Examination Guidelines: A complete evaluation includes B-mode imaging, spectral Doppler, color Doppler, and power Doppler as needed of all accessible portions of each vessel. Bilateral testing is considered an integral part of a complete examination. Limited examinations for reoccurring indications may be performed as noted. The reflux portion of the exam is performed with the patient in reverse Trendelenburg.  +---------+---------------+---------+-----------+----------+--------------+ RIGHT    CompressibilityPhasicitySpontaneityPropertiesThrombus Aging +---------+---------------+---------+-----------+----------+--------------+ CFV      Full           Yes      Yes                                  +---------+---------------+---------+-----------+----------+--------------+ SFJ      Full                                                        +---------+---------------+---------+-----------+----------+--------------+ FV Prox  Full                                                        +---------+---------------+---------+-----------+----------+--------------+ FV Mid   Full                                                        +---------+---------------+---------+-----------+----------+--------------+ FV DistalFull                                                        +---------+---------------+---------+-----------+----------+--------------+ PFV      Full                                                        +---------+---------------+---------+-----------+----------+--------------+ POP      Full           Yes      Yes                                 +---------+---------------+---------+-----------+----------+--------------+  PTV      Full                                                        +---------+---------------+---------+-----------+----------+--------------+ PERO     Full                                                        +---------+---------------+---------+-----------+----------+--------------+   +---------+---------------+---------+-----------+----------+--------------+ LEFT     CompressibilityPhasicitySpontaneityPropertiesThrombus Aging +---------+---------------+---------+-----------+----------+--------------+ CFV      Full           Yes      Yes                                 +---------+---------------+---------+-----------+----------+--------------+ SFJ      Full                                                        +---------+---------------+---------+-----------+----------+--------------+ FV Prox  Full                                                         +---------+---------------+---------+-----------+----------+--------------+ FV Mid   Full                                                        +---------+---------------+---------+-----------+----------+--------------+ FV DistalFull                                                        +---------+---------------+---------+-----------+----------+--------------+ PFV      Full                                                        +---------+---------------+---------+-----------+----------+--------------+ POP      Full           Yes      Yes                                 +---------+---------------+---------+-----------+----------+--------------+ PTV      Full                                                        +---------+---------------+---------+-----------+----------+--------------+  PERO     Full                                                        +---------+---------------+---------+-----------+----------+--------------+     Summary: BILATERAL: - No evidence of deep vein thrombosis seen in the lower extremities, bilaterally. - RIGHT: - A cystic structure is found in the popliteal fossa.  LEFT: - No cystic structure found in the popliteal fossa.  *See table(s) above for measurements and observations.    Preliminary    DG Chest Portable 1 View  Result Date: 11/06/2022 CLINICAL DATA:  Cardiac arrest. EXAM: PORTABLE CHEST 1 VIEW COMPARISON:  Chest x-ray dated Aug 11, 2021. FINDINGS: Endotracheal tube tip in good position approximately 2.2 cm above the carina. Enteric tube tip in the stomach. Mild cardiomegaly. Layering bilateral pleural effusions, greater on the right. No pneumothorax. No acute osseous abnormality. IMPRESSION: 1. Appropriately positioned endotracheal and enteric tubes. 2. Layering bilateral pleural effusions, greater on the right. Electronically Signed   By: Obie Dredge M.D.   On: 11/06/2022 12:38   CT HEMATURIA WORKUP  Result Date:  11/06/2022 CLINICAL DATA:  Microscopic hematuria.  Status post cardiac arrest. EXAM: CT ABDOMEN AND PELVIS WITHOUT AND WITH CONTRAST TECHNIQUE: Multidetector CT imaging of the abdomen and pelvis was performed following the standard protocol before and following the bolus administration of intravenous contrast. RADIATION DOSE REDUCTION: This exam was performed according to the departmental dose-optimization program which includes automated exposure control, adjustment of the mA and/or kV according to patient size and/or use of iterative reconstruction technique. CONTRAST:  OMNIPAQUE IOHEXOL 300 MG/ML  SOLN COMPARISON:  08/15/2021 FINDINGS: Lower chest: Moderate right pleural effusion and small left effusion identified. No airspace consolidation. Trace pericardial effusion. Aortic atherosclerosis and coronary artery calcifications. Hepatobiliary: There is no focal liver abnormality. Status post cholecystectomy. No bile duct dilatation. Pancreas: Unremarkable. No pancreatic ductal dilatation or surrounding inflammatory changes. Spleen: Normal in size without focal abnormality. Adrenals/Urinary Tract: Normal adrenal glands. There is no nephrolithiasis, hydronephrosis or suspicious kidney mass. No signs of hydroureter. Decompressed urinary bladder contains a small amount of excreted contrast material. No bladder calculi or enhancing bladder lesions identified. Stomach/Bowel: Stomach is nondistended. There is no pathologic dilatation of the large or small bowel loops to indicate a bowel obstruction. Postoperative changes involving the anterior small bowel loops identified with anastomotic suture chain noted, image 31/5. No bowel wall thickening identified. The appendix is not confidently identified. Sigmoid diverticulosis without signs of acute diverticulitis. Vascular/Lymphatic: Aortic atherosclerosis without aneurysm. The upper abdominal vascularity is patent. There is diffuse edema within the scratch edema with  soft tissue stranding identified within the mesentery and retroperitoneal fat. Small mesenteric and retroperitoneal lymph nodes are identified without signs of adenopathy. Reproductive: Unremarkable.  No mass identified. Other: Small to moderate volume of abdominal ascites is noted which extends over the liver, spleen and stomach. Fluid is also noted extending along bilateral pericolic gutters. No discrete fluid collection to suggest an abscess. No signs of pneumoperitoneum. Fat containing right inguinal hernia. Diffuse body wall edema is noted compatible with anasarca. Musculoskeletal: No acute or suspicious osseous findings. Mild thoracolumbar degenerative disc disease and lower lumbar facet arthropathy. IMPRESSION: 1. No nephrolithiasis, hydronephrosis or suspicious kidney mass. 2. Decompressed urinary bladder contains a small amount of excreted contrast material. No bladder  calculi or enhancing bladder lesions identified. 3. Small to moderate volume of abdominal ascites. 4. Diffuse body wall edema compatible with anasarca. 5. Moderate right pleural effusion and small left effusion. 6. Aortic Atherosclerosis (ICD10-I70.0). Electronically Signed   By: Signa Kell M.D.   On: 11/06/2022 11:21     Assessment and Plan:   PEA arrest - suspect hypoxia /respiratory arrest during CT, not ACS driven, he has shown significant clinical recovery so far despite repeated CPR  - further care per ICU team   Permanent A fib Atrial flutter RVR  Non-specific IVCD  - reviewed all EKG and telemetry, appears wide complex tachycardia is consistent with atrial flutter with aberrancy   - OK to continue amiodarone gtt for now, rate is controlled, plan to stop tomorrow  - may resume PTA coreg and pradaxa once BP is stable and no bleeding concern   Chronic systolic and diastolic heart failure  - clinical exam is difficult due to body habitus, no overt overload  - historical symptoms is overall stable with DOE, BLE edema  per patient and family  - weight is 248 ibs today at home, near baseline weight per family - likely near euvolemic status, does not appears in low output CHF,  may give IV diuresis additionally as needed/BP allows , on lasix 40mg  daily at home  - will update Echo  - GDMT: may resume PTA coreg once BP stable, hold off adding additional agents until more stable   CAD - remote PCI to LAD in 2000 at [facility]  - Hs trop 33 >148, not consistent with ACS, likely demand in the setting of code blue - EKG does not have true anterior lead STE, appears to be flutter wave and PVC, reviewed with MD - no angina symptoms at home - continue medical therapy with coreg, on repatha at home, no antiplatelet due to pradaxa use, intolerant to statin  - update Echo - no plan for ischemic evaluation at this time   Ventilator dependent respiratory failure  Post arrest lactic acidosis  OSA HTN Obesity Type 2 DM  Scrotum mass BPH - per primary team    Risk Assessment/Risk Scores:    New York Heart Association (NYHA) Functional Class NYHA Class II  CHA2DS2-VASc Score = 6  This indicates a 9.7% annual risk of stroke. The patient's score is based upon: CHF History: 1 HTN History: 1 Diabetes History: 1 Stroke History: 0 Vascular Disease History: 1 Age Score: 2 Gender Score: 0   For questions or updates, please contact [facility] Please consult [email or web address] for contact info under    Signed, Cyndi Bender, NP  11/06/2022 4:40 PM  Patient seen, examined. Available data reviewed. Agree with findings, assessment, and plan as outlined by Cyndi Bender, NP. Case discussed with CCM attending, Dr Denese Killings.  On my exam, the patient is intubated but awake and responds appropriately, following commands.  He is in no acute distress.  His chest wall is tender to palpation.  Lungs are clear bilaterally, HEENT is normal with an ET tube in place.  Neck difficult to assess, no obvious JVD, no carotid bruits, heart  is regular rate and rhythm with a 2/6 systolic murmur at the left sternal border, abdomen is soft and nontender, extremities show 1+ pretibial and ankle edema with chronic stasis changes.  Skin is pertinent only for stasis dermatitis in the lower legs.  The patient is warm and well-perfused.  Case was discussed earlier with Dr. Dalene Seltzer after the patient arrested  at [facility] when he was undergoing a radiographic study in the prone position.  He initially had a PEA arrest and required CPR/ACLS until ROSC was achieved.  He later had wide-complex tachycardia requiring amiodarone and defibrillation.  The patient's rhythm has stabilized as he has longstanding permanent atrial fibrillation and now is in atrial flutter with variable block.  He is hemodynamically stable.  The patient is on Pradaxa at home.  Probably best to avoid heparin in the context of CPR and significant chest wall tenderness.  I think the risk of bleeding complications post CPR is quite high.  He can be started back on Pradaxa if he extubates in the next 24 hours.  I agree that he most likely suffered a respiratory related arrest today due to airway obstruction and being in the prone position.  It does not appear that he had a primary ischemic event.  His troponin is minimally elevated in the setting of cardiopulmonary arrest.  He has no ischemic EKG changes at this time.  We have ordered a 2D echocardiogram to reassess LV function.  He does have some evidence of volume excess and IV furosemide has been ordered.  He will need continued diuresis.  Cardiology team will follow along with his care.  It is very encouraging that he is awake, alert, and following commands after his arrest.  He had very prompt ACLS and all appropriate supportive measures were done.  Tonny Bollman, M.D. 11/06/2022 5:13 PM

## 2022-11-06 NOTE — ED Notes (Addendum)
EKG performed after ROSC was achieved. Pt had multiple episodes of pulseless activity throughout Code episode. Pt is stable at this time. Vitals are stable at this time. Pt is Sinus Tachycardic at this time.

## 2022-11-06 NOTE — Code Documentation (Signed)
Pulse check- pulse noted.

## 2022-11-06 NOTE — H&P (Signed)
NAME:  Shawn Blanchard, MRN:  [id number], DOB:  1936/03/14, LOS: 0 ADMISSION DATE:  11/06/2022, CONSULTATION DATE:  11/06/22 REFERRING MD:  EDP, CHIEF COMPLAINT:  Cardiac arrest   History of Present Illness:  Shawn Blanchard is a 87 y.o. M with extensive PMH as listed below but notable for HFpEF, HTN, CAD, Afib,Type 2 DM, SBO, OSA and follows with Dr. Wynona Neat and uses CPAP who has had recent hematuria and scrotal mass and was getting an outpatient CT scan for hematuria work-up.  Reportedly he was not breathing when staff checked on him after the scan-he had be in the prone position.  A code blue was called for PEA arrest and he underwent 6-8 minutes of ACLS before ROSC, he then had some PAC's and converted to Vfib and was defibrillated twice.   [facility]  He briefly required levophed before this was transitioned off.  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), Aortic atherosclerosis (HCC) (06/22/2021), Arthritis (03/12/2016), Blepharitis of both upper and lower eyelid (10/02/2018), BMI 35.0-35.9,adult (04/11/2016), Carpal tunnel syndrome of right wrist (03/25/2019), Chronic anticoagulation (10/11/2016), Chronic atrial fibrillation (HCC) (06/12/2015), Chronic diastolic heart failure (HCC) (40/98/1191), Class 1 obesity (08/08/2021), Coronary artery disease involving native coronary artery of native heart with angina pectoris (HCC) (02/15/2015), Dermatochalasis of both upper eyelids (11/21/2016), Diabetic foot infection (HCC) (08/08/2021), Diabetic polyneuropathy associated with type 2 diabetes mellitus (HCC) (12/16/2018), Disorder of sesamoid bone of foot, Essential hypertension (11/07/2017), 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), Ganglion cyst (11/27/2015), Gastroesophageal reflux  disease without esophagitis (06/12/2015), Gout of left foot, History of lymphoma (05/17/2021), 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), Hyperlipidemia (04/11/2016), Hypertensive heart disease with heart failure (HCC) (02/15/2015), Hypertensive urgency (05/17/2021), Idiopathic chronic gout (05/17/2015), Insomnia (11/27/2015), Iron deficiency anemia (04/27/2021), Leukocytosis (04/11/2016), 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), Male erectile disorder (06/17/2016), Meibomian gland dysfunction (MGD) of both eyes (10/02/2018), Normocytic anemia (05/17/2021), Obstructive sleep apnea (08/30/2015), Old MI (myocardial infarction), Orthostatic dizziness (09/15/2017), Peripheral venous insufficiency (11/08/2020), Peritoneal adhesion (11/08/2020), Pseudophakia of both eyes (03/12/2016), Pulmonary hypertension (HCC) (08/30/2015), Restless leg syndrome (08/30/2015), SBO (small bowel obstruction) (HCC) (12/14/2019), Sensorineural hearing loss (SNHL), bilateral (10/04/2019), Small bowel obstruction (HCC) (04/11/2016), Stage 3a chronic kidney disease (HCC) (06/13/2019), Statin intolerance (03/09/2021), Type 2 diabetes mellitus with neurologic complication, without long-term current use of insulin (HCC) (12/02/2014), Type 2 diabetes mellitus with stage 3a chronic kidney disease, without long-term current use of insulin (HCC) (02/22/2020), Ulcer of left foot with fat layer exposed (HCC), and UTI (urinary tract infection) (05/17/2021).   Significant Hospital Events: Including procedures, antibiotic start and stop dates in addition to other pertinent events   7/31 code blue, PEA 6-8 minutes ACLS then Vfib, no pressors intubated   Interim History / Subjective:  Off pressors on arrival, on low dose propofol and waking up, following commands   Objective   Blood pressure (!) 167/76, pulse 79, temperature (!) 96.4 F (35.8 C), resp. rate 20, SpO2 100%.    Vent Mode: PRVC FiO2 (%):  [100 %] 100 % Set Rate:  [20 bmp] 20  bmp Vt Set:  [600 mL] 600 mL PEEP:  [5 cmH20] 5 cmH20 Plateau Pressure:  [18 cmH20] 18 cmH20   Intake/Output Summary (Last 24 hours) at 11/06/2022 1344 Last data filed at  11/06/2022 1300 Gross per 24 hour  Intake 156.93 ml  Output 60 ml  Net 96.93 ml   There were no vitals filed for this visit.  General:  obese M, critically ill sedated and ventilated  HEENT: MM pink/moist, ETT in place  Neuro: waking up and following commands on Propofol CV: s1s2 rrr, no m/r/g PULM:  course breath sounds throughout, on PRVC, synchronous  GI: soft, obese, no obvious TTP Extremities: warm/dry, 1+ edema  Skin: no rashes or lesions   Labs; WBC 11 Hgb 12 Glu 148 Trop 33 Creatinine 1.6 BNP 840  Resolved Hospital Problem list     Assessment & Plan:    Witnessed in hospital PEA cardiac arrest with transient Vfib after ROSC and subsequent acute hypoxic respiratory failure  History of OSA, CAD and HFpEF Most suspicious that pt had an obstruction event while laying prone for CT leading to respiratory arrest Not in shock on arrival, POC cardiac US done and concern for mildly reduced EF and slight ST elevations on EKG, initial trop 33 -trend trop, start heparin and Asa, supportive care with normothermia, euglycemia  -LE dopplers -echo -fentanyl and propofol, if he is stable and able to wean on vent then perhaps extubated today --Maintain full vent support with SAT/SBT as tolerated -titrate Vent setting to maintain SpO2 greater than or equal to 90%. -HOB elevated 30 degrees. -Plateau pressures less than 30 cm H20.  -Follow chest x-ray, ABG prn.   -Bronchial hygiene and RT/bronchodilator protocol.    Type 2 DM -SSI    AKI Baseline creatinine appears to be around 1.4, now 1.6 likely secondary to hypoperfusion in setting of code -trend renal indices and monitor UOP and electrolytes   Best Practice (right click and "Reselect all SmartList Selections" daily)   Diet/type: NPO DVT  prophylaxis: systemic heparin GI prophylaxis: H2B Lines: N/A Foley:  Yes, and it is still needed Code Status:  full code Last date of multidisciplinary goals of care discussion [duaghter updated at the bedside by Dr. Agarwala]  Labs   CBC: Recent Labs  Lab 11/06/22 1111 11/06/22 1129  WBC 11.1*  --   NEUTROABS 5.2  --   HGB 12.2* 12.6*  12.6*  HCT 39.0 37.0*  37.0*  MCV 93.1  --   PLT 178  --     Basic Metabolic Panel: Recent Labs  Lab 11/06/22 1111 11/06/22 1129  NA 137 143  143  K 3.8 3.4*  3.3*  CL 103 105  CO2 22  --   GLUCOSE 148* 131*  BUN 33* 36*  CREATININE 1.60* 1.40*  CALCIUM 8.7*  --   MG 2.1  --    GFR: Estimated Creatinine Clearance: 50.5 mL/min (A) (by C-G formula based on SCr of 1.4 mg/dL (H)). Recent Labs  Lab 11/06/22 1111  WBC 11.1*    Liver Function Tests: Recent Labs  Lab 11/06/22 1111  AST 28  ALT 13  ALKPHOS 79  BILITOT 0.9  PROT 6.8  ALBUMIN 3.7   No results for input(s): "LIPASE", "AMYLASE" in the last 168 hours. No results for input(s): "AMMONIA" in the last 168 hours.  ABG    Component Value Date/Time   HCO3 27.1 11/06/2022 1129   TCO2 26 11/06/2022 1129   TCO2 29 11/06/2022 1129   ACIDBASEDEF 1.0 11/06/2022 1129   O2SAT 82 11/06/2022 1129     Coagulation Profile: No results for input(s): "INR", "PROTIME" in the last 168 hours.  Cardiac Enzymes: No results for input(s): "  CKTOTAL", "CKMB", "CKMBINDEX", "TROPONINI" in the last 168 hours.  HbA1C: Hgb A1c MFr Bld  Date/Time Value Ref Range Status  10/31/2022 08:40 AM 5.4 4.6 - 6.5 % Final    Comment:    Glycemic Control Guidelines for People with Diabetes:Non Diabetic:  <6%Goal of Therapy: <7%Additional Action Suggested:  >8%   07/25/2022 09:22 AM 5.9 4.6 - 6.5 % Final    Comment:    Glycemic Control Guidelines for People with Diabetes:Non Diabetic:  <6%Goal of Therapy: <7%Additional Action Suggested:  >8%     CBG: Recent Labs  Lab 11/06/22 1055  GLUCAP  103*    Review of Systems:   Unable to obtain  Past Medical History:  He,  has a past medical history of Acute on chronic systolic CHF (congestive heart failure) (HCC) (11/07/2017), 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), Aortic atherosclerosis (HCC) (06/22/2021), Arthritis (03/12/2016), Blepharitis of both upper and lower eyelid (10/02/2018), BMI 35.0-35.9,adult (04/11/2016), Carpal tunnel syndrome of right wrist (03/25/2019), Chronic anticoagulation (10/11/2016), Chronic atrial fibrillation (HCC) (06/12/2015), Chronic diastolic heart failure (HCC) (16/01/9603), Class 1 obesity (08/08/2021), Coronary artery disease involving native coronary artery of native heart with angina pectoris (HCC) (02/15/2015), Dermatochalasis of both upper eyelids (11/21/2016), Diabetic foot infection (HCC) (08/08/2021), Diabetic polyneuropathy associated with type 2 diabetes mellitus (HCC) (12/16/2018), Disorder of sesamoid bone of foot, Essential hypertension (11/07/2017), 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), Ganglion cyst (11/27/2015), Gastroesophageal reflux disease without esophagitis (06/12/2015), Gout of left foot, History of lymphoma (05/17/2021), 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), Hyperlipidemia (04/11/2016), Hypertensive heart disease with heart failure (HCC) (02/15/2015), Hypertensive urgency (05/17/2021), Idiopathic chronic gout (05/17/2015), Insomnia (11/27/2015), Iron deficiency anemia (04/27/2021), Leukocytosis (04/11/2016), 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), Male erectile disorder (06/17/2016), Meibomian gland dysfunction (MGD) of both eyes (10/02/2018), Normocytic anemia (05/17/2021), Obstructive sleep apnea (08/30/2015), Old MI (myocardial infarction), Orthostatic dizziness (09/15/2017), Peripheral venous insufficiency (11/08/2020), Peritoneal adhesion (11/08/2020), Pseudophakia of both eyes (03/12/2016), Pulmonary hypertension (HCC) (08/30/2015), Restless leg syndrome  (08/30/2015), SBO (small bowel obstruction) (HCC) (12/14/2019), Sensorineural hearing loss (SNHL), bilateral (10/04/2019), Small bowel obstruction (HCC) (04/11/2016), Stage 3a chronic kidney disease (HCC) (06/13/2019), Statin intolerance (03/09/2021), Type 2 diabetes mellitus with neurologic complication, without long-term current use of insulin (HCC) (12/02/2014), Type 2 diabetes mellitus with stage 3a chronic kidney disease, without long-term current use of insulin (HCC) (02/22/2020), Ulcer of left foot with fat layer exposed (HCC), and UTI (urinary tract infection) (05/17/2021).   Surgical History:   Past Surgical History:  Procedure Laterality Date   ABDOMINAL ADHESION SURGERY     x 2   BLEPHAROPLASTY Bilateral    CARPAL TUNNEL RELEASE Right 2021   CATARACT EXTRACTION Bilateral    CERVICAL SPINE SURGERY     CHOLECYSTECTOMY     CORONARY ANGIOPLASTY WITH STENT PLACEMENT  2000   IRRIGATION AND DEBRIDEMENT FOOT Left 08/11/2021   Procedure: IRRIGATION AND DEBRIDEMENT FOOT, AND BONE BIOPSY, PARTIAL SESMOIDECTOMY;  Surgeon: Edwin Cap, DPM;  Location: [facility];  Service: Podiatry;  Laterality: Left;   NASAL SINUS SURGERY     ORIF TIBIAL SHAFT FRACTURE W/ PLATES AND SCREWS Left    PARTIAL KNEE ARTHROPLASTY Bilateral    SMALL INTESTINE SURGERY     Adenocarcinoma   TONSILLECTOMY       Social History:   reports that he has never smoked. He has never used smokeless tobacco. He reports that he does not currently use alcohol. He reports that he does not use drugs.   Family History:  His  family history includes Cancer in his mother and paternal grandfather; Diabetes in his brother, brother, father, mother, and sister; Heart disease in his father and mother; Stroke in his father. There is no history of Colon cancer, Rectal cancer, Pancreatic cancer, Liver cancer, or Stomach cancer.   Allergies Allergies  Allergen Reactions   Ace Inhibitors Rash   Amoxicillin Nausea Only   Atorvastatin Other (See  Comments)    Dizziness.   Jardiance [Empagliflozin] Other (See Comments)    Orthostasis   Meperidine Rash   Naproxen Sodium Rash   Sulfa Antibiotics Nausea Only   Tetracycline Nausea Only     Home Medications  Prior to Admission medications   Medication Sig Start Date End Date Taking? Authorizing Provider  albuterol (VENTOLIN HFA) 108 (90 Base) MCG/ACT inhaler Inhale 2 puffs into the lungs every 6 (six) hours as needed for wheezing or shortness of breath. 07/25/22   Loyola Mast, MD  allopurinol (ZYLOPRIM) 100 MG tablet TAKE 1 TABLET BY MOUTH EVERY DAY 09/04/22   Loyola Mast, MD  Alpha-Lipoic Acid 600 MG TABS Take 600 mg by mouth in the morning and at bedtime.    [provider]  b complex vitamins capsule Take 1 capsule by mouth daily.    [provider]  carvedilol (COREG) 3.125 MG tablet Take 1 tablet (3.125 mg total) by mouth 2 (two) times daily with a meal. 04/11/22   Munley, Iline Oven, MD  Cyanocobalamin (B-12) 1000 MCG SUBL Place 1,000 mg under the tongue.    [provider]  dabigatran (PRADAXA) 150 MG CAPS capsule Take 1 capsule (150 mg total) by mouth 2 (two) times daily. 09/18/22   Baldo Daub, MD  FEROSUL 325 (65 Fe) MG tablet TAKE 1 TABLET BY MOUTH 2 TIMES A DAY 09/25/22   Loyola Mast, MD  furosemide (LASIX) 20 MG tablet Take 2 tablets (40 mg total) by mouth daily with breakfast AND 1 tablet (20 mg total) daily before lunch. 04/26/22   Loyola Mast, MD  glucose blood test strip OneTouch Ultra Blue Test Strip    [provider]  hydrocortisone 2.5 % ointment Apply 1 application topically 2 (two) times daily as needed (Skin Irritation). 11/29/20   [provider]  lactobacillus acidophilus (BACID) TABS tablet Take 1 tablet by mouth 3 (three) times daily.    [provider]  Lancets [facility] DELICA PLUS LANCET33G) MISC OneTouch Delica Plus Lancet 33 gauge    [provider]  loperamide (IMODIUM A-D) 2 MG  tablet Take 2 mg by mouth 4 (four) times daily as needed for diarrhea or loose stools.    [provider]  Melatonin 5 MG CAPS Take 5 mg by mouth at bedtime.    [provider]  metFORMIN (GLUCOPHAGE-XR) 500 MG 24 hr tablet TAKE 2 TABLETS BY MOUTH EVERY DAY WITH DINNER (DIABETES) 08/15/22   Loyola Mast, MD  nystatin cream (MYCOSTATIN) Apply 1 Application topically 2 (two) times daily. 10/30/22   Stoneking, Danford Bad., MD  OVER THE COUNTER MEDICATION Take 25 mg by mouth daily. Balance of Nature: Fruits and Vegs supplements    [provider]  polyvinyl alcohol (LIQUIFILM TEARS) 1.4 % ophthalmic solution Place 1 drop into both eyes as needed for dry eyes.    [provider]  psyllium (METAMUCIL) 58.6 % packet Take 1 packet by mouth at bedtime. With a full glass of water    [provider]  REPATHA SURECLICK 140 MG/ML  SOAJ INJECT 140 MG INTO THE SKIN EVERY 14 DAYS 08/28/22   Baldo Daub, MD  SitaGLIPtin-MetFORMIN HCl (JANUMET XR) [phone] MG TB24 TAKE 1 TABLET BY MOUTH EVERY DAY WITH BREAKFAST (FOR DIABETES) 05/16/22   Loyola Mast, MD  Spacer/Aero-Holding Deretha Emory DEVI Use with albuterol inhaler 08/13/21   Loyola Mast, MD  traMADol-acetaminophen (ULTRACET) 37.5-325 MG tablet Take by mouth. 08/15/22   [provider]     Critical care time:  45 minutes     CRITICAL CARE Performed by: Darcella Gasman Cael Worth   Total critical care time: 45 minutes  Critical care time was exclusive of separately billable procedures and treating other patients.  Critical care was necessary to treat or prevent imminent or life-threatening deterioration.  Critical care was time spent personally by me on the following activities: development of treatment plan with patient and/or surrogate as well as nursing, discussions with consultants, evaluation of patient's response to treatment, examination of patient, obtaining history from patient or surrogate, ordering and  performing treatments and interventions, ordering and review of laboratory studies, ordering and review of radiographic studies, pulse oximetry and re-evaluation of patient's condition.   Darcella Gasman Amen Staszak, PA-C [facility] See Amion for pager If no response to pager , please call [phone] until 7pm After 7:00 pm call Elink  [phone]

## 2022-11-06 NOTE — Progress Notes (Signed)
ANTICOAGULATION CONSULT NOTE  Pharmacy Consult for heparin Indication: chest pain/ACS  Allergies  Allergen Reactions   Ace Inhibitors Rash   Amoxicillin Nausea Only   Atorvastatin Other (See Comments)    Dizziness.   Jardiance [Empagliflozin] Other (See Comments)    Orthostasis   Meperidine Rash   Naproxen Sodium Rash   Sulfa Antibiotics Nausea Only   Tetracycline Nausea Only    Patient Measurements:   Heparin Dosing Weight: 100kg  Vital Signs: Temp: 96.4 F (35.8 C) (07/31 1210) Temp Source: Bladder (07/31 1209) BP: 167/76 (07/31 1330) Pulse Rate: 79 (07/31 1330)  Labs: Recent Labs    11/06/22 1111 11/06/22 1129  HGB 12.2* 12.6*  12.6*  HCT 39.0 37.0*  37.0*  PLT 178  --   CREATININE 1.60* 1.40*  TROPONINIHS 33*  --     Estimated Creatinine Clearance: 50.5 mL/min (A) (by C-G formula based on SCr of 1.4 mg/dL (H)).   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   Aortic atherosclerosis (HCC) 06/22/2021   Arthritis 03/12/2016   Blepharitis of both upper and lower eyelid 10/02/2018   BMI 35.0-35.9,adult 04/11/2016   Carpal tunnel syndrome of right wrist 03/25/2019   Chronic anticoagulation 10/11/2016   Chronic atrial fibrillation (HCC) 06/12/2015   Overview:  Managed CARDS   Chronic diastolic heart failure (HCC) 09/01/2015   Class 1 obesity 08/08/2021   Coronary artery disease involving native coronary artery of native heart with angina pectoris (HCC) 02/15/2015   Overview:  Hx of MI with stent to LAD 2002 MPS 2016 with EF 47% and no ischemia   Dermatochalasis of both upper eyelids 11/21/2016   Diabetic foot infection (HCC) 08/08/2021   Diabetic polyneuropathy associated with type 2 diabetes mellitus (HCC) 12/16/2018   2020   Disorder of sesamoid bone of foot    Essential hypertension 11/07/2017   Floppy eyelid syndrome of  both eyes 10/05/2019   Ganglion cyst 11/27/2015   Overview:  2017: dorsum right hand   Gastroesophageal reflux disease without esophagitis 06/12/2015   Gout of left foot    History of lymphoma 05/17/2021   History of squamous cell carcinoma in situ 04/18/2021   Hyperlipidemia 04/11/2016   Hypertensive heart disease with heart failure (HCC) 02/15/2015   Hypertensive urgency 05/17/2021   Idiopathic chronic gout 05/17/2015   Insomnia 11/27/2015   Iron deficiency anemia 04/27/2021   Leukocytosis 04/11/2016   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   Male erectile disorder 06/17/2016   Meibomian gland dysfunction (MGD) of both eyes 10/02/2018   Normocytic anemia 05/17/2021   Obstructive sleep apnea 08/30/2015   Overview:  CPAP   Old MI (myocardial infarction)    Orthostatic dizziness 09/15/2017   2018: med related   Peripheral venous insufficiency 11/08/2020   Peritoneal adhesion 11/08/2020   Pseudophakia of both eyes 03/12/2016   Pulmonary hypertension (HCC) 08/30/2015   Overview:  Managed CARDS   Restless leg syndrome 08/30/2015   SBO (small bowel obstruction) (HCC) 12/14/2019   Sensorineural hearing loss (SNHL), bilateral 10/04/2019   Small bowel obstruction (HCC) 04/11/2016   Stage 3a chronic kidney disease (HCC) 06/13/2019   Statin intolerance 03/09/2021   Type 2 diabetes mellitus with neurologic complication, without long-term current use of insulin (HCC) 12/02/2014   Type 2 diabetes mellitus with stage 3a chronic kidney disease, without long-term current use of insulin (HCC) 02/22/2020   Ulcer  of left foot with fat layer exposed (HCC)    UTI (urinary tract infection) 05/17/2021     Assessment: 53 yoM admitted s/p cardiac arrest. Pt presented for elective CT scan for hematuria workup which was c/b PEA then VF arrest. Pharmacy to dose heparin for ACS r/o. CBC wnl, no AC PTA. Given recent hematuria and chest compressions will defer heparin bolus to start.  Goal of Therapy:  Heparin  level 0.3-0.7 units/ml Monitor platelets by anticoagulation protocol: Yes   Plan:  Heparin 1400 units/h no bolus Check heparin level in 8h  Fredonia Highland, PharmD, [place], [facility] Clinical Pharmacist [phone] Please check AMION for all [facility] Pharmacy numbers 11/06/2022

## 2022-11-06 NOTE — Procedures (Signed)
Extubation Procedure Note  Patient Details:   Name: Shawn Blanchard DOB: [birth year] MRN: [id number]   Airway Documentation:  Airway 8 mm (Active)  Secured at (cm) 26 cm 11/06/22 1635  Measured From Lips 11/06/22 1635  Secured Location Left 11/06/22 1635  Secured By [employer] 11/06/22 1635  Prone position No 11/06/22 1635  Cuff Pressure (cm H2O) Clear OR 27-39 CmH2O 11/06/22 1635  Site Condition Dry 11/06/22 1635   Vent end date: (not recorded) Vent end time: (not recorded)   Evaluation  O2 sats: stable throughout Complications: No apparent complications Patient did tolerate procedure well. Bilateral Breath Sounds: Rhonchi, Diminished   Yes Pt weaned for 40 mins CP/PS positive cuff leak extubated per MD orders. Pt extubated to 4 lpm nasal cannula tolerating well.  Letitia Caul [facility] 11/06/2022, 5:15 PM

## 2022-11-06 NOTE — ED Notes (Signed)
Code Blue alarm noted in department. Upon looking at call light noted that Code Blue was located in CT scan room. This Clinical research associate proceeded to go into room and noted pt located on CT scanner table in the prone position. Pt was noted to be unresponsive. Staff assisted patient into a supine position. Code Cart pulled to bedside. Pt noted to have no pulse, CPR started.

## 2022-11-06 NOTE — Progress Notes (Signed)
ABG obtained on patient and never crossed over in the system results was as follows.  PH 7.32 34 203 Bi 22.

## 2022-11-06 NOTE — Progress Notes (Signed)
Lower extremity venous duplex completed. Please see CV Procedures for preliminary results.  Shona Simpson, RVT 11/06/22 3:36 PM

## 2022-11-06 NOTE — Code Documentation (Signed)
EKG performed.

## 2022-11-06 NOTE — Code Documentation (Signed)
No pulse 

## 2022-11-06 NOTE — ED Notes (Signed)
Patient left facility via [facility]. Family has pt belongings. Pt stable at time of transport.

## 2022-11-06 NOTE — ED Provider Notes (Signed)
[place] [facility] Provider Note   CSN: [id number] Arrival date & time: 11/06/22  1058     History  Chief Complaint  Patient presents with   Cardiac Arrest    Shawn Blanchard is a 87 y.o. male.  HPI     87yo male with history ofCAD, chronic atrial fibrillation on pradaxa, HFrEF, hypertension, pulmonary hypertension, DM, hematuria, scrotal mass, BPH, who was receiving CT hematuria protocol today at our [facility] outpatient CT when he developed cardiac arrest.   Daughter at bedside reports he has chronic dyspnea and fatigue, was maybe more short of breath walking into the building today however improved after resting and it is not unusual for him to have some dyspnea on exertion.  He otherwise had seemed to be in normal state of health, had not described fevers, chest pain, new cough.  Today during CT had contrast and then was placed in prone position per protocol, and while he was in prone position was found to be unresponsive, cyanotic, apneic and pulseless.  Immediate CPR was started by staff in CT and CODE BLUE called. ED staff responded and found him to be in PEA arrest.     Had cardiac arrest in CT, initially PEA arrest with CPR, had ROSC at 10 minutes, then loss of pulses with CPR 2 minutes, the ROSC for 5 minutes, then 5 minutes of CPR, then 1118 had ROSC with maintained pulse until transfer.  Initial PEA< developed runs of wide complex rhythm, cardioverted x2 120J, 200J without direct change, given 300mg  amiodarone, 150mg  x1, started on gtt.  Initially in and out of wide complex rhythm with NSVT, stabilized on amiodarone gtt. Able to wean off levophed prior to transfer.  Discussed with Dr. Excell Seltzer Cardiology and Dr. Isaiah Serge.    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   Aortic atherosclerosis (HCC) 06/22/2021    Arthritis 03/12/2016   Blepharitis of both upper and lower eyelid 10/02/2018   BMI 35.0-35.9,adult 04/11/2016   Carpal tunnel syndrome of right wrist 03/25/2019   Chronic anticoagulation 10/11/2016   Chronic atrial fibrillation (HCC) 06/12/2015   Overview:  Managed CARDS   Chronic diastolic heart failure (HCC) 09/01/2015   Class 1 obesity 08/08/2021   Coronary artery disease involving native coronary artery of native heart with angina pectoris (HCC) 02/15/2015   Overview:  Hx of MI with stent to LAD 2002 MPS 2016 with EF 47% and no ischemia   Dermatochalasis of both upper eyelids 11/21/2016   Diabetic foot infection (HCC) 08/08/2021   Diabetic polyneuropathy associated with type 2 diabetes mellitus (HCC) 12/16/2018   2020   Disorder of sesamoid bone of foot    Essential hypertension 11/07/2017   Floppy eyelid syndrome of both eyes 10/05/2019   Ganglion cyst 11/27/2015   Overview:  2017: dorsum right hand   Gastroesophageal reflux disease without esophagitis 06/12/2015   Gout of left foot    History of lymphoma 05/17/2021   History of squamous cell carcinoma in situ 04/18/2021   Hyperlipidemia 04/11/2016   Hypertensive heart disease with heart failure (HCC) 02/15/2015   Hypertensive urgency 05/17/2021   Idiopathic chronic gout 05/17/2015   Insomnia 11/27/2015   Iron deficiency anemia 04/27/2021   Leukocytosis 04/11/2016   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   Male erectile disorder 06/17/2016   Meibomian gland dysfunction (MGD) of both eyes  10/02/2018   Normocytic anemia 05/17/2021   Obstructive sleep apnea 08/30/2015   Overview:  CPAP   Old MI (myocardial infarction)    Orthostatic dizziness 09/15/2017   2018: med related   Peripheral venous insufficiency 11/08/2020   Peritoneal adhesion 11/08/2020   Pseudophakia of both eyes 03/12/2016   Pulmonary hypertension (HCC) 08/30/2015   Overview:  Managed CARDS   Restless leg syndrome 08/30/2015   SBO (small bowel obstruction) (HCC)  12/14/2019   Sensorineural hearing loss (SNHL), bilateral 10/04/2019   Small bowel obstruction (HCC) 04/11/2016   Stage 3a chronic kidney disease (HCC) 06/13/2019   Statin intolerance 03/09/2021   Type 2 diabetes mellitus with neurologic complication, without long-term current use of insulin (HCC) 12/02/2014   Type 2 diabetes mellitus with stage 3a chronic kidney disease, without long-term current use of insulin (HCC) 02/22/2020   Ulcer of left foot with fat layer exposed (HCC)    UTI (urinary tract infection) 05/17/2021    Home Medications Prior to Admission medications   Medication Sig Start Date End Date Taking? Authorizing Provider  albuterol (VENTOLIN HFA) 108 (90 Base) MCG/ACT inhaler Inhale 2 puffs into the lungs every 6 (six) hours as needed for wheezing or shortness of breath. 07/25/22  Yes Loyola Mast, MD  allopurinol (ZYLOPRIM) 100 MG tablet TAKE 1 TABLET BY MOUTH EVERY DAY 09/04/22  Yes Loyola Mast, MD  Alpha-Lipoic Acid 600 MG TABS Take 600 mg by mouth in the morning and at bedtime.   Yes [provider]  b complex vitamins capsule Take 1 capsule by mouth daily.   Yes [provider]  carvedilol (COREG) 3.125 MG tablet Take 1 tablet (3.125 mg total) by mouth 2 (two) times daily with a meal. 04/11/22  Yes Munley, Iline Oven, MD  Cyanocobalamin (B-12) 1000 MCG SUBL Place 1,000 mg under the tongue.   Yes [provider]  dabigatran (PRADAXA) 150 MG CAPS capsule Take 1 capsule (150 mg total) by mouth 2 (two) times daily. 09/18/22  Yes Baldo Daub, MD  FEROSUL 325 (65 Fe) MG tablet TAKE 1 TABLET BY MOUTH 2 TIMES A DAY Patient taking differently: Take 325 mg by mouth daily. 09/25/22  Yes Loyola Mast, MD  furosemide (LASIX) 20 MG tablet Take 2 tablets (40 mg total) by mouth daily with breakfast AND 1 tablet (20 mg total) daily before lunch. 04/26/22  Yes Loyola Mast, MD  hydrocortisone 2.5 % ointment Apply 1 application topically 2 (two) times daily as  needed (Skin Irritation). 11/29/20  Yes [provider]  lactobacillus acidophilus (BACID) TABS tablet Take 1 tablet by mouth 3 (three) times daily.   Yes [provider]  loperamide (IMODIUM A-D) 2 MG tablet Take 2 mg by mouth 4 (four) times daily as needed for diarrhea or loose stools.   Yes [provider]  Melatonin 5 MG CAPS Take 5 mg by mouth at bedtime.   Yes [provider]  metFORMIN (GLUCOPHAGE-XR) 500 MG 24 hr tablet TAKE 2 TABLETS BY MOUTH EVERY DAY WITH DINNER (DIABETES) Patient taking differently: Take 500 mg by mouth every evening. 08/15/22  Yes Loyola Mast, MD  nystatin cream (MYCOSTATIN) Apply 1 Application topically 2 (two) times daily. Patient taking differently: Apply 1 Application topically 2 (two) times daily as needed for dry skin. 10/30/22  Yes Stoneking, Danford Bad., MD  OVER THE COUNTER MEDICATION Take 25 mg by mouth daily. Medication: Balance of Nature: Fruits and Vegs supplements   Yes [provider]  polyvinyl alcohol (LIQUIFILM TEARS) 1.4 % ophthalmic solution Place 1 drop into both eyes as needed for dry eyes.   Yes [provider]  psyllium (METAMUCIL) 58.6 % packet Take 1 packet by mouth at bedtime. With a full glass of water   Yes [provider]  REPATHA SURECLICK 140 MG/ML SOAJ INJECT 140 MG INTO THE SKIN EVERY 14 DAYS Patient taking differently: Inject 140 mg into the skin every 14 (fourteen) days. 08/28/22  Yes Baldo Daub, MD  SitaGLIPtin-MetFORMIN HCl (JANUMET XR) [id number] MG TB24 TAKE 1 TABLET BY MOUTH EVERY DAY WITH BREAKFAST (FOR DIABETES) Patient taking differently: Take 1 tablet by mouth daily. 05/16/22  Yes Loyola Mast, MD  glucose blood test strip OneTouch Ultra [facility]    [provider]  Lancets [facility] DELICA PLUS [place]) MISC OneTouch [place] Plus Lancet 33 gauge    [provider]  Spacer/Aero-Holding Rudean Curt Use with albuterol inhaler 08/13/21    Loyola Mast, MD  traMADol-acetaminophen (ULTRACET) 37.5-325 MG tablet Take by mouth. Patient not taking: Reported on 11/06/2022 08/15/22   [provider]      Allergies    Ace inhibitors, Amoxicillin, Atorvastatin, Jardiance [empagliflozin], Meperidine, Naproxen sodium, Sulfa antibiotics, and Tetracycline    Review of Systems   Review of Systems  Physical Exam Updated Vital Signs BP (!) 116/47 (BP Location: Left Arm)   Pulse 61   Temp 97.9 F (36.6 C) (Bladder)   Resp (!) 21   SpO2 100%  Physical Exam Vitals and nursing note reviewed.  Constitutional:      Appearance: He is well-developed and overweight. He is ill-appearing and toxic-appearing. He is not diaphoretic.     Comments: Initially unresponsive  HENT:     Head: Normocephalic and atraumatic.  Eyes:     Conjunctiva/sclera: Conjunctivae normal.     Pupils: Pupils are equal, round, and reactive to light.  Cardiovascular:     Rate and Rhythm: Normal rate and regular rhythm.     Comments: With rosk normal rate-mild tachcyardia Pulmonary:     Effort: No respiratory distress.     Breath sounds: No wheezing or rales.     Comments: Initally apneic, did have respiratory effort assisted with bvm prior to intubation Abdominal:     General: There is distension.     Palpations: Abdomen is soft.     Tenderness: There is no abdominal tenderness. There is no guarding.  Skin:    Coloration: Skin is cyanotic.  Neurological:     GCS: GCS eye subscore is 1. GCS verbal subscore is 2. GCS motor subscore is 1.     ED Results / Procedures / Treatments   Labs (all labs ordered are listed, but only abnormal results are displayed) Labs Reviewed  CBC WITH DIFFERENTIAL/PLATELET - Abnormal; Notable for the following components:      Result Value   WBC 11.1 (*)    RBC 4.19 (*)    Hemoglobin 12.2 (*)    RDW 15.9 (*)    Lymphs Abs 4.6 (*)    Abs Immature Granulocytes 0.11 (*)    All other components within normal limits   COMPREHENSIVE METABOLIC PANEL - Abnormal; Notable for the following components:   Glucose, Bld 148 (*)    BUN 33 (*)    Creatinine, Ser 1.60 (*)    Calcium 8.7 (*)    GFR, Estimated 42 (*)    All other components within normal limits  LACTIC ACID,  PLASMA - Abnormal; Notable for the following components:   Lactic Acid, Venous 3.9 (*)    All other components within normal limits  BRAIN NATRIURETIC PEPTIDE - Abnormal; Notable for the following components:   B Natriuretic Peptide 840.3 (*)    All other components within normal limits  URINALYSIS, W/ REFLEX TO CULTURE (INFECTION SUSPECTED) - Abnormal; Notable for the following components:   APPearance HAZY (*)    Glucose, UA 100 (*)    Hgb urine dipstick LARGE (*)    Bilirubin Urine SMALL (*)    Protein, ur >=300 (*)    Bacteria, UA FEW (*)    All other components within normal limits  CBC - Abnormal; Notable for the following components:   WBC 13.8 (*)    RBC 4.00 (*)    Hemoglobin 11.6 (*)    HCT 37.2 (*)    RDW 15.7 (*)    All other components within normal limits  CREATININE, SERUM - Abnormal; Notable for the following components:   Creatinine, Ser 1.77 (*)    GFR, Estimated 37 (*)    All other components within normal limits  GLUCOSE, CAPILLARY - Abnormal; Notable for the following components:   Glucose-Capillary 125 (*)    All other components within normal limits  I-STAT CHEM 8, ED - Abnormal; Notable for the following components:   Potassium 3.3 (*)    BUN 36 (*)    Creatinine, Ser 1.40 (*)    Glucose, Bld 131 (*)    Hemoglobin 12.6 (*)    HCT 37.0 (*)    All other components within normal limits  I-STAT VENOUS BLOOD GAS, ED - Abnormal; Notable for the following components:   pO2, Ven 54 (*)    Potassium 3.4 (*)    Calcium, Ion 1.41 (*)    HCT 37.0 (*)    Hemoglobin 12.6 (*)    All other components within normal limits  POCT I-STAT 7, (LYTES, BLD GAS, ICA,H+H) - Abnormal; Notable for the following components:    pO2, Arterial 203 (*)    HCT 36.0 (*)    Hemoglobin 12.2 (*)    All other components within normal limits  TROPONIN I (HIGH SENSITIVITY) - Abnormal; Notable for the following components:   Troponin I (High Sensitivity) 33 (*)    All other components within normal limits  TROPONIN I (HIGH SENSITIVITY) - Abnormal; Notable for the following components:   Troponin I (High Sensitivity) 148 (*)    All other components within normal limits  TROPONIN I (HIGH SENSITIVITY) - Abnormal; Notable for the following components:   Troponin I (High Sensitivity) 500 (*)    All other components within normal limits  SARS CORONAVIRUS 2 BY RT PCR  URINE CULTURE  MAGNESIUM  LACTIC ACID, PLASMA  BLOOD GAS, ARTERIAL  CBC  BASIC METABOLIC PANEL  MAGNESIUM  PHOSPHORUS  LACTIC ACID, PLASMA  I-STAT ARTERIAL BLOOD GAS, ED    EKG EKG Interpretation Date/Time:  Wednesday November 06 2022 11:30:17 EDT Ventricular Rate:  101 PR Interval:  154 QRS Duration:  125 QT Interval:  356 QTC Calculation: 462 R Axis:   -88  Text Interpretation: Sinus tachycardia Nonspecific IVCD with LAD Abnormal lateral Q waves Anterior infarct, old PW present now, suspect now sinus rhythm Confirmed by Alvira Monday ([id number]) on 11/06/2022 9:30:55 PM  Radiology VAS [place] LOWER EXTREMITY VENOUS (DVT)  Result Date: 11/06/2022  Lower Venous DVT Study Patient Name:  SIRIUS LUMPP  Date of Exam:   11/06/2022  Medical Rec #: [id number]           Accession #:    [id number] Date of Birth: 31-Dec-1935          Patient Gender: M Patient Age:   36 years Exam Location:  [facility] Procedure:      VAS [place] LOWER EXTREMITY VENOUS (DVT) Referring Phys: Emilie Rutter --------------------------------------------------------------------------------  Indications: Edema.  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 Anticoagulation: Heparin. Comparison Study: No prior study Performing Technologist: Shona Simpson  Examination Guidelines: A complete  evaluation includes B-mode imaging, spectral Doppler, color Doppler, and power Doppler as needed of all accessible portions of each vessel. Bilateral testing is considered an integral part of a complete examination. Limited examinations for reoccurring indications may be performed as noted. The reflux portion of the exam is performed with the patient in reverse Trendelenburg.  +---------+---------------+---------+-----------+----------+--------------+ RIGHT    CompressibilityPhasicitySpontaneityPropertiesThrombus Aging +---------+---------------+---------+-----------+----------+--------------+ CFV      Full           Yes      Yes                                 +---------+---------------+---------+-----------+----------+--------------+ SFJ      Full                                                        +---------+---------------+---------+-----------+----------+--------------+ FV Prox  Full                                                        +---------+---------------+---------+-----------+----------+--------------+ FV Mid   Full                                                        +---------+---------------+---------+-----------+----------+--------------+ FV DistalFull                                                        +---------+---------------+---------+-----------+----------+--------------+ PFV      Full                                                        +---------+---------------+---------+-----------+----------+--------------+ POP      Full           Yes      Yes                                 +---------+---------------+---------+-----------+----------+--------------+ PTV      Full                                                        +---------+---------------+---------+-----------+----------+--------------+  PERO     Full                                                         +---------+---------------+---------+-----------+----------+--------------+   +---------+---------------+---------+-----------+----------+--------------+ LEFT     CompressibilityPhasicitySpontaneityPropertiesThrombus Aging +---------+---------------+---------+-----------+----------+--------------+ CFV      Full           Yes      Yes                                 +---------+---------------+---------+-----------+----------+--------------+ SFJ      Full                                                        +---------+---------------+---------+-----------+----------+--------------+ FV Prox  Full                                                        +---------+---------------+---------+-----------+----------+--------------+ FV Mid   Full                                                        +---------+---------------+---------+-----------+----------+--------------+ FV DistalFull                                                        +---------+---------------+---------+-----------+----------+--------------+ PFV      Full                                                        +---------+---------------+---------+-----------+----------+--------------+ POP      Full           Yes      Yes                                 +---------+---------------+---------+-----------+----------+--------------+ PTV      Full                                                        +---------+---------------+---------+-----------+----------+--------------+ PERO     Full                                                        +---------+---------------+---------+-----------+----------+--------------+  Summary: BILATERAL: - No evidence of deep vein thrombosis seen in the lower extremities, bilaterally. - RIGHT: - A cystic structure is found in the popliteal fossa.  LEFT: - No cystic structure found in the popliteal fossa.  *See table(s) above for measurements and  observations. Electronically signed by Heath Lark on 11/06/2022 at 5:32:04 PM.    Final    DG Chest Portable 1 View  Result Date: 11/06/2022 CLINICAL DATA:  Cardiac arrest. EXAM: PORTABLE CHEST 1 VIEW COMPARISON:  Chest x-ray dated Aug 11, 2021. FINDINGS: Endotracheal tube tip in good position approximately 2.2 cm above the carina. Enteric tube tip in the stomach. Mild cardiomegaly. Layering bilateral pleural effusions, greater on the right. No pneumothorax. No acute osseous abnormality. IMPRESSION: 1. Appropriately positioned endotracheal and enteric tubes. 2. Layering bilateral pleural effusions, greater on the right. Electronically Signed   By: Obie Dredge M.D.   On: 11/06/2022 12:38   CT HEMATURIA WORKUP  Result Date: 11/06/2022 CLINICAL DATA:  Microscopic hematuria.  Status post cardiac arrest. EXAM: CT ABDOMEN AND PELVIS WITHOUT AND WITH CONTRAST TECHNIQUE: Multidetector CT imaging of the abdomen and pelvis was performed following the standard protocol before and following the bolus administration of intravenous contrast. RADIATION DOSE REDUCTION: This exam was performed according to the departmental dose-optimization program which includes automated exposure control, adjustment of the mA and/or kV according to patient size and/or use of iterative reconstruction technique. CONTRAST:  OMNIPAQUE IOHEXOL 300 MG/ML  SOLN COMPARISON:  08/15/2021 FINDINGS: Lower chest: Moderate right pleural effusion and small left effusion identified. No airspace consolidation. Trace pericardial effusion. Aortic atherosclerosis and coronary artery calcifications. Hepatobiliary: There is no focal liver abnormality. Status post cholecystectomy. No bile duct dilatation. Pancreas: Unremarkable. No pancreatic ductal dilatation or surrounding inflammatory changes. Spleen: Normal in size without focal abnormality. Adrenals/Urinary Tract: Normal adrenal glands. There is no nephrolithiasis, hydronephrosis or suspicious  kidney mass. No signs of hydroureter. Decompressed urinary bladder contains a small amount of excreted contrast material. No bladder calculi or enhancing bladder lesions identified. Stomach/Bowel: Stomach is nondistended. There is no pathologic dilatation of the large or small bowel loops to indicate a bowel obstruction. Postoperative changes involving the anterior small bowel loops identified with anastomotic suture chain noted, image 31/5. No bowel wall thickening identified. The appendix is not confidently identified. Sigmoid diverticulosis without signs of acute diverticulitis. Vascular/Lymphatic: Aortic atherosclerosis without aneurysm. The upper abdominal vascularity is patent. There is diffuse edema within the scratch edema with soft tissue stranding identified within the mesentery and retroperitoneal fat. Small mesenteric and retroperitoneal lymph nodes are identified without signs of adenopathy. Reproductive: Unremarkable.  No mass identified. Other: Small to moderate volume of abdominal ascites is noted which extends over the liver, spleen and stomach. Fluid is also noted extending along bilateral pericolic gutters. No discrete fluid collection to suggest an abscess. No signs of pneumoperitoneum. Fat containing right inguinal hernia. Diffuse body wall edema is noted compatible with anasarca. Musculoskeletal: No acute or suspicious osseous findings. Mild thoracolumbar degenerative disc disease and lower lumbar facet arthropathy. IMPRESSION: 1. No nephrolithiasis, hydronephrosis or suspicious kidney mass. 2. Decompressed urinary bladder contains a small amount of excreted contrast material. No bladder calculi or enhancing bladder lesions identified. 3. Small to moderate volume of abdominal ascites. 4. Diffuse body wall edema compatible with anasarca. 5. Moderate right pleural effusion and small left effusion. 6. Aortic Atherosclerosis (ICD10-I70.0). Electronically Signed   By: Signa Kell M.D.   On:  11/06/2022 11:21    Procedures Procedure Name:  Intubation Date/Time: 11/06/2022 9:50 PM  Performed by: Alvira Monday, MDPre-anesthesia Checklist: Patient identified, Patient being monitored, Emergency Drugs available, Timeout performed and Suction available Oxygen Delivery Method: Non-rebreather mask Preoxygenation: Pre-oxygenation with 100% oxygen Induction Type: Rapid sequence Ventilation: Mask ventilation without difficulty and Two handed mask ventilation required Laryngoscope Size: Glidescope Tube size: 8.0 mm Number of attempts: 1 Placement Confirmation: ETT inserted through vocal cords under direct vision, CO2 detector and Breath sounds checked- equal and bilateral    CPR  Date/Time: 11/06/2022 9:51 PM  Performed by: Alvira Monday, MD Authorized by: Alvira Monday, MD  CPR Procedure Details:      Amount of time prior to administration of ACLS/BLS (minutes):  1   CPR/ACLS performed in the ED: Yes     Duration of CPR (minutes):  15   Outcome: ROSC obtained    CPR performed via ACLS guidelines under my direct supervision.  See RN documentation for details including defibrillator use, medications, doses and timing. Comments:     8 minutes, followed by 2 minutes, followed by 5 minutes CPR with 15 minutes today .Critical Care  Performed by: Alvira Monday, MD Authorized by: Alvira Monday, MD   Critical care provider statement:    Critical care time (minutes):  75   Critical care was time spent personally by me on the following activities:  Development of treatment plan with patient or surrogate, discussions with consultants, evaluation of patient's response to treatment, examination of patient, ordering and review of laboratory studies, ordering and review of radiographic studies, ordering and performing treatments and interventions, pulse oximetry, re-evaluation of patient's condition and review of old charts     Medications Ordered in ED Medications  propofol  (DIPRIVAN) 1000 MG/100ML infusion ( Intravenous Canceled Entry 11/06/22 1900)  norepinephrine (LEVOPHED) 4mg  in (0.016 mg/mL) premix infusion (5 mcg/min Intravenous Infusion Verify 11/06/22 1900)  famotidine (PEPCID) tablet 20 mg (20 mg Per Tube Given 11/06/22 1800)  docusate (COLACE) 50 MG/5ML liquid 100 mg (100 mg Per Tube Given 11/06/22 1800)  polyethylene glycol (MIRALAX / GLYCOLAX) packet 17 g (17 g Per Tube Given 11/06/22 1800)  fentaNYL (SUBLIMAZE) injection 25 mcg (has no administration in time range)  fentaNYL (SUBLIMAZE) injection 25-100 mcg (has no administration in time range)  oxyCODONE (Oxy IR/ROXICODONE) immediate release tablet 5 mg (5 mg Per Tube Given 11/06/22 1453)  acetaminophen (TYLENOL) tablet 650 mg (650 mg Per Tube Given 11/06/22 1453)  insulin aspart (novoLOG) injection 0-15 Units (2 Units Subcutaneous Given 11/06/22 1847)  heparin injection 5,000 Units (has no administration in time range)  aspirin EC tablet 81 mg (81 mg Oral Given 11/06/22 1759)  amiodarone (NEXTERONE PREMIX) 360-4.14 MG/200ML-% (1.8 mg/mL) IV infusion (20 mg/hr Intravenous Infusion Verify 11/06/22 2000)  Oral care mouth rinse (has no administration in time range)  EPINEPHrine (ADRENALIN) 1 MG/10ML injection (1 mg Intravenous Given 11/06/22 1117)  etomidate (AMIDATE) injection (20 mg Intravenous Given 11/06/22 1059)  rocuronium (ZEMURON) injection (100 mg Intravenous Given 11/06/22 1100)  EPINEPHrine (ADRENALIN) 1 MG/10ML injection (1 mg Intravenous Given 11/06/22 1102)  sodium bicarbonate injection (100 mEq Intravenous Given 11/06/22 1143)  calcium chloride injection (1 g Intravenous Given 11/06/22 1119)  amiodarone (CORDARONE) injection (300 mg Intravenous Given 11/06/22 1117)  amiodarone (NEXTERONE) 1.5 mg/mL IV bolus only (0 mg Intravenous Stopped 11/06/22 1836)  furosemide (LASIX) injection 60 mg (60 mg Intravenous Given by Other 11/06/22 1724)    ED Course/ Medical Decision Making/ A&P  87yo male with history of CAD, chronic atrial fibrillation on pradaxa, HFrEF, hypertension, pulmonary hypertension, DM, hematuria, scrotal mass, BPH, who was receiving CT hematuria protocol today at our [facility] outpatient CT when he developed cardiac arrest.   Had cardiac arrest in CT, initially PEA arrest with CPR, had ROSC for 10 minutes, then loss of pulses with CPR 2 minutes, the ROSC for 5 minutes, then 5 minutes of CPR, then 1118 had ROSC with maintained pulse until transfer.  Initial PEA< developed runs of wide complex rhythm, cardioverted x2 120J, 200J without direct change, given 300mg  amiodarone, 150mg  x1, started on gtt.  Initially in and out of wide complex rhythm with NSVT, stabilized on amiodarone gtt. Given calcium/bicarb in setting of wide complex rhythm.  Started on levophed gtt after second arrest, able to wean off levophed prior to transfer.  Propofol initiated for sedation.  Total 15 minutes of CPR, longest continuous 8 minutes.   CXR completed and personally evaluated by me with ETT in place, pleural effusions present.  Ordered zosyn for possible aspiration.   Labs completed and personally evaluated by me shows stable Cr, mild hypokalemia, BNP elevated to 840, hemoglobin normal, mild leukocytosis.    Unclear etiology of arrest--doubt anaphylaxis given no history of contrast allergy, arrest without prior symptoms/no wheezing/rash/airway edema, and with continued instability and arrhythmia after epinephrine and intubation.  Possible cardiac arrhythmia, possible respiratory arrest in setting of pleural effusions/anasarca, size and prone positioning.    Stable for transfer to PCCM, currently on amiodarone and propofol.    Discussed with Dr. Excell Seltzer Cardiology and Dr. Isaiah Serge.          Final Clinical Impression(s) / ED Diagnoses Final diagnoses:  Cardiac arrest (HCC)  Wide-complex tachycardia  PEA (Pulseless electrical activity) [facility])    Rx / DC  Orders ED Discharge Orders     None         Alvira Monday, MD 11/06/22 2154

## 2022-11-06 NOTE — Progress Notes (Signed)
RT called to respond to code in CT. When I arrived, CPR in progress. RT bagged patient to room 14, and then assisted with intubation. 8.0 ETT secured at 26. Placed on vent upon ROSC. RT to monitor.

## 2022-11-06 NOTE — Code Documentation (Signed)
Shocked administered @ 200j

## 2022-11-06 NOTE — Progress Notes (Signed)
Pharmacy Antibiotic Note  Shawn Blanchard is a 87 y.o. male admitted on 7/31/2024Pharmacy has been consulted for zosyn for aspiration pneumonia dosing. WBC is elevated and Scr is elevated at 1.6.   Plan: Zosyn 3.375g IV Q8H (4 hr) F/u renal fxn, C&S, clinical status  *Pharmacy will sign off an only follow peripherally as no dose adjustments are anticipated.     No data recorded.  Recent Labs  Lab 11/06/22 1111 11/06/22 1129  WBC 11.1*  --   CREATININE 1.60* 1.40*    Estimated Creatinine Clearance: 50.5 mL/min (A) (by C-G formula based on SCr of 1.4 mg/dL (H)).    Allergies  Allergen Reactions   Ace Inhibitors Rash   Amoxicillin Nausea Only   Atorvastatin Other (See Comments)    Dizziness.   Jardiance [Empagliflozin] Other (See Comments)    Orthostasis   Meperidine Rash   Naproxen Sodium Rash   Sulfa Antibiotics Nausea Only   Tetracycline Nausea Only    Thank you for allowing pharmacy to be a part of this patient's care.  Kresha Abelson, Drake Leach 11/06/2022 12:08 PM

## 2022-11-06 NOTE — Progress Notes (Signed)
Report called to 2H RT, Marcelino Duster.

## 2022-11-06 NOTE — Code Documentation (Signed)
CPR resumed 

## 2022-11-06 NOTE — Code Documentation (Signed)
Pulse noted

## 2022-11-06 NOTE — Code Documentation (Signed)
Pulse check: no pulse 

## 2022-11-06 NOTE — Progress Notes (Signed)
Date and time results received: 11/06/22 1435    Test: Lactic Acid  Critical Value: 3.9  Name of Provider Notified: Denese Killings, MD   MD at bedside

## 2022-11-06 NOTE — Code Documentation (Signed)
  CBG 103  

## 2022-11-06 NOTE — Code Documentation (Signed)
Time out prior to intubation.

## 2022-11-06 NOTE — Code Documentation (Signed)
Intubation performed by Dr.  Dalene Seltzer

## 2022-11-06 NOTE — Progress Notes (Signed)
Date and time results received: 11/06/22 1505  Test: Troponin  Critical Value: 148  Name of Provider Notified: Denese Killings, MD   Orders received

## 2022-11-06 NOTE — Code Documentation (Signed)
161/68 

## 2022-11-06 NOTE — Code Documentation (Signed)
Shock performed @ 110 j

## 2022-11-07 ENCOUNTER — Inpatient Hospital Stay (HOSPITAL_COMMUNITY): Payer: Medicare PPO

## 2022-11-07 ENCOUNTER — Encounter (HOSPITAL_COMMUNITY): Payer: Self-pay | Admitting: Pulmonary Disease

## 2022-11-07 DIAGNOSIS — I469 Cardiac arrest, cause unspecified: Secondary | ICD-10-CM | POA: Diagnosis not present

## 2022-11-07 LAB — COMPREHENSIVE METABOLIC PANEL
ALT: 22 U/L (ref 0–44)
AST: 46 U/L — ABNORMAL HIGH (ref 15–41)
Albumin: 3.3 g/dL — ABNORMAL LOW (ref 3.5–5.0)
Alkaline Phosphatase: 65 U/L (ref 38–126)
Anion gap: 12 (ref 5–15)
BUN: 35 mg/dL — ABNORMAL HIGH (ref 8–23)
CO2: 24 mmol/L (ref 22–32)
Calcium: 9.2 mg/dL (ref 8.9–10.3)
Chloride: 100 mmol/L (ref 98–111)
Creatinine, Ser: 2.1 mg/dL — ABNORMAL HIGH (ref 0.61–1.24)
GFR, Estimated: 30 mL/min — ABNORMAL LOW (ref >60)
Glucose, Bld: 130 mg/dL — ABNORMAL HIGH (ref 70–99)
Potassium: 4.3 mmol/L (ref 3.5–5.1)
Sodium: 136 mmol/L (ref 135–145)
Total Bilirubin: 1.7 mg/dL — ABNORMAL HIGH (ref 0.3–1.2)
Total Protein: 6 g/dL — ABNORMAL LOW (ref 6.5–8.1)

## 2022-11-07 LAB — GLUCOSE, CAPILLARY
Glucose-Capillary: 107 mg/dL — ABNORMAL HIGH (ref 70–99)
Glucose-Capillary: 115 mg/dL — ABNORMAL HIGH (ref 70–99)
Glucose-Capillary: 116 mg/dL — ABNORMAL HIGH (ref 70–99)
Glucose-Capillary: 116 mg/dL — ABNORMAL HIGH (ref 70–99)
Glucose-Capillary: 121 mg/dL — ABNORMAL HIGH (ref 70–99)
Glucose-Capillary: 126 mg/dL — ABNORMAL HIGH (ref 70–99)
Glucose-Capillary: 137 mg/dL — ABNORMAL HIGH (ref 70–99)
Glucose-Capillary: 142 mg/dL — ABNORMAL HIGH (ref 70–99)

## 2022-11-07 LAB — MAGNESIUM: Magnesium: 2 mg/dL (ref 1.7–2.4)

## 2022-11-07 LAB — PHOSPHORUS: Phosphorus: 4.7 mg/dL — ABNORMAL HIGH (ref 2.5–4.6)

## 2022-11-07 LAB — MRSA NEXT GEN BY PCR, NASAL: MRSA by PCR Next Gen: DETECTED — AB

## 2022-11-07 MED ORDER — MUPIROCIN 2 % EX OINT
TOPICAL_OINTMENT | Freq: Two times a day (BID) | CUTANEOUS | Status: DC
  Administered 2022-11-07: 1 via NASAL
  Filled 2022-11-07 (×3): qty 22

## 2022-11-07 MED ORDER — LIDOCAINE 5 % EX PTCH
1.0000 | MEDICATED_PATCH | CUTANEOUS | Status: DC
  Administered 2022-11-07 – 2022-11-09 (×3): 1 via TRANSDERMAL
  Filled 2022-11-07 (×3): qty 1

## 2022-11-07 MED ORDER — FENTANYL CITRATE PF 50 MCG/ML IJ SOSY: 25.0000 ug | PREFILLED_SYRINGE | INTRAMUSCULAR | Status: DC | PRN

## 2022-11-07 MED ORDER — PERFLUTREN LIPID MICROSPHERE
1.0000 mL | INTRAVENOUS | Status: AC | PRN
  Administered 2022-11-07: 8 mL via INTRAVENOUS

## 2022-11-07 MED ORDER — CHLORHEXIDINE GLUCONATE CLOTH 2 % EX PADS
6.0000 | MEDICATED_PAD | Freq: Every day | CUTANEOUS | Status: DC
  Administered 2022-11-07 – 2022-11-08 (×2): 6 via TOPICAL

## 2022-11-07 NOTE — Plan of Care (Signed)

## 2022-11-07 NOTE — Progress Notes (Signed)
   11/07/22 2223  Vitals  Temp (!) 97.5 F (36.4 C)  Temp Source Oral  BP 121/76  MAP (mmHg) 82  BP Location Right Arm  BP Method Automatic  Patient Position (if appropriate) Lying  Pulse Rate 72  Pulse Rate Source Monitor  ECG Heart Rate 67  Resp 16  MEWS COLOR  MEWS Score Color Green  Oxygen Therapy  SpO2 98 %  O2 Device Nasal Cannula  O2 Flow Rate (L/min) 3 L/min  MEWS Score  MEWS Temp 0  MEWS Systolic 0  MEWS Pulse 0  MEWS RR 0  MEWS LOC 0  MEWS Score 0   Patient was a transfer from [facility], after transferring patient to bed, patient went in respiratory distress with a sat of 86% and became agitated. RN placed patient on oxygen and monitored. Vitals are stable, however patient had some labored breathing. RT and Charge RN in the room to help stabilize patient. Patient Alert and oriented to self and appears comfortable now.

## 2022-11-07 NOTE — Progress Notes (Signed)
  Echocardiogram 2D Echocardiogram has been performed.  Leda Roys RDCS 11/07/2022, 9:55 AM

## 2022-11-07 NOTE — Progress Notes (Signed)
   11/07/22 2305  BiPAP/CPAP/SIPAP  Flow Rate (S)  2 lpm (bled in)

## 2022-11-07 NOTE — Evaluation (Signed)
Physical Therapy Evaluation Patient Details Name: Shawn Blanchard MRN: [id number] DOB: 11/11/1935 Today's Date: 11/07/2022  History of Present Illness  Pt is 87 year old presented on  11/06/22 after cardiac arrest during outpt CT at [name]. Intubated and transferred to [facility]. Suspect airway obstruction, diastolic heart failure combination contributing to cardiopulmonary arrest. Extubated 7/31. 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  Clinical Impression  Pt admitted with above diagnosis and presents to PT with functional limitations due to deficits listed below (See PT problem list). Pt needs skilled PT to maximize independence and safety. Patient will benefit from intensive inpatient follow up therapy, >3 hours/day. Was living at home with wife prior and was ambulatory. Needs more therapy to return to baseline.           If plan is discharge home, recommend the following: A lot of help with walking and/or transfers;A lot of help with bathing/dressing/bathroom;Assistance with cooking/housework;Direct supervision/assist for medications management;Direct supervision/assist for financial management;Assist for transportation;Help with stairs or ramp for entrance   Can travel by private vehicle        Equipment Recommendations Other (comment) (To be determined)  Recommendations for Other Services  Rehab consult    Functional Status Assessment Patient has had a recent decline in their functional status and demonstrates the ability to make significant improvements in function in a reasonable and predictable amount of time.     Precautions / Restrictions Precautions Precautions: Fall      Mobility  Bed Mobility Overal bed mobility: Needs Assistance Bed Mobility: Supine to Sit, Sit to Supine     Supine to sit: Mod assist Sit to supine: +2 for physical assistance, Max assist   General bed mobility  comments: Assist to bring legs off of bed, elevate trunk into sitting and bring hips to EOB. Assist for all aspects returning to supine due to pain    Transfers Overall transfer level: Needs assistance Equipment used: 2 person hand held assist Transfers: Sit to/from Stand Sit to Stand: +2 physical assistance, Mod assist           General transfer comment: Assist to power up and for balance.    Ambulation/Gait             Pre-gait activities: Shuffled feet slightly up toward head of bed in standing with +2 mod assist of hand held    Stairs            Wheelchair Mobility     Tilt Bed    Modified Rankin (Stroke Patients Only)       Balance Overall balance assessment: Needs assistance Sitting-balance support: Bilateral upper extremity supported, Feet supported Sitting balance-Leahy Scale: Poor Sitting balance - Comments: UE support and min guard for static standing   Standing balance support: Bilateral upper extremity supported Standing balance-Leahy Scale: Poor Standing balance comment: +2 min assist of hand held to maintain static standing                             Pertinent Vitals/Pain Pain Assessment Pain Assessment: Faces Faces Pain Scale: Hurts even more Pain Location: chest, ribs Pain Descriptors / Indicators: Grimacing, Guarding Pain Intervention(s): Limited activity within patient's tolerance, Monitored during session, Repositioned    Home Living Family/patient expects to be discharged to:: Private residence Living Arrangements: Spouse/significant other Available Help at Discharge: Family;Available 24 hours/day Type of  Home: House Home Access: Level entry       Home Layout: Two level;Able to live on main level with bedroom/bathroom Home Equipment: Rollator (4 wheels);Cane - single point      Prior Function Prior Level of Function : Independent/Modified Independent;History of Falls (last six months)              Mobility Comments: Amb with rollator       Hand Dominance   Dominant Hand: Right    Extremity/Trunk Assessment   Upper Extremity Assessment Upper Extremity Assessment: Defer to OT evaluation    Lower Extremity Assessment Lower Extremity Assessment: Generalized weakness       Communication   Communication: No difficulties  Cognition Arousal/Alertness: Awake/alert Behavior During Therapy: WFL for tasks assessed/performed Overall Cognitive Status: Impaired/Different from baseline Area of Impairment: Memory, Attention, Following commands, Safety/judgement, Awareness, Problem solving                   Current Attention Level: Sustained Memory: Decreased short-term memory Following Commands: Follows one step commands consistently, Follows one step commands with increased time Safety/Judgement: Decreased awareness of safety, Decreased awareness of deficits Awareness: Intellectual Problem Solving: Slow processing, Decreased initiation, Requires verbal cues, Requires tactile cues, Difficulty sequencing          General Comments General comments (skin integrity, edema, etc.): VSS on RA    Exercises     Assessment/Plan    PT Assessment Patient needs continued PT services  PT Problem List Decreased strength;Decreased activity tolerance;Decreased balance;Decreased mobility;Decreased cognition;Decreased safety awareness;Pain       PT Treatment Interventions DME instruction;Gait training;Functional mobility training;Therapeutic activities;Therapeutic exercise;Balance training;Cognitive remediation;Patient/family education    PT Goals (Current goals can be found in the Care Plan section)  Acute Rehab PT Goals Patient Stated Goal: not stated PT Goal Formulation: With patient Time For Goal Achievement: 11/21/22 Potential to Achieve Goals: Good    Frequency Min 1X/week     Co-evaluation               AM-PAC PT "6 Clicks" Mobility  Outcome Measure Help  needed turning from your back to your side while in a flat bed without using bedrails?: A Lot Help needed moving from lying on your back to sitting on the side of a flat bed without using bedrails?: A Lot Help needed moving to and from a bed to a chair (including a wheelchair)?: Total Help needed standing up from a chair using your arms (e.g., wheelchair or bedside chair)?: Total Help needed to walk in hospital room?: Total Help needed climbing 3-5 steps with a railing? : Total 6 Click Score: 8    End of Session   Activity Tolerance: Patient limited by pain;Patient limited by fatigue Patient left: in bed;with call bell/phone within reach;with nursing/sitter in room;with family/visitor present Nurse Communication: Mobility status (Nurse assisted) PT Visit Diagnosis: Unsteadiness on feet (R26.81);Other abnormalities of gait and mobility (R26.89);History of falling (Z91.81);Muscle weakness (generalized) (M62.81);Pain Pain - part of body:  (chest and ribs)    Time: 0938-1829 PT Time Calculation (min) (ACUTE ONLY): 24 min   Charges:   PT Evaluation $PT Eval Moderate Complexity: 1 Mod PT Treatments $Therapeutic Activity: 8-22 mins PT General Charges $$ ACUTE PT VISIT: 1 Visit         [facility] PT Acute Rehabilitation Services Office [phone]   Angelina Ok [facility] 11/07/2022, 3:41 PM

## 2022-11-07 NOTE — Progress Notes (Signed)
Rounding Note    Patient Name: Shawn Blanchard Date of Encounter: 11/07/2022  [facility] Cardiologist: Norman Herrlich, MD   Subjective   Patient feeling well this morning.  Denies shortness of breath.  Has a lot of chest soreness from CPR.  No other complaints.  Hematuria noted in Foley bag.  Inpatient Medications    Scheduled Meds:  acetaminophen  650 mg Oral Q6H   aspirin EC  81 mg Oral Daily   Chlorhexidine Gluconate Cloth  6 each Topical Daily   docusate sodium  100 mg Oral BID   famotidine  20 mg Oral BID   heparin  5,000 Units Subcutaneous Q8H   insulin aspart  0-15 Units Subcutaneous Q4H   oxyCODONE  5 mg Oral Q6H   polyethylene glycol  17 g Oral Daily   Continuous Infusions:  amiodarone 30 mg/hr (11/07/22 0800)   norepinephrine (LEVOPHED) Adult infusion 2 mcg/min (11/07/22 0800)   PRN Meds: fentaNYL (SUBLIMAZE) injection, fentaNYL (SUBLIMAZE) injection, mouth rinse   Vital Signs    Vitals:   11/07/22 0730 11/07/22 0745 11/07/22 0800 11/07/22 0815  BP: (!) 123/54 (!) 101/37 96/65 132/61  Pulse: 81 77 71 76  Resp: (!) 28 15 19  (!) 22  Temp: 99.1 F (37.3 C) 99.1 F (37.3 C) 99.1 F (37.3 C) 99.1 F (37.3 C)  TempSrc:   Bladder   SpO2: 95% (!) 88% (!) 87% 94%  Weight:      Height:        Intake/Output Summary (Last 24 hours) at 11/07/2022 0856 Last data filed at 11/07/2022 0800 Gross per 24 hour  Intake 838.18 ml  Output 420 ml  Net 418.18 ml      11/07/2022    6:54 AM 10/31/2022    7:56 AM 10/30/2022    9:53 AM  Last 3 Weights  Weight (lbs) 256 lb 13.4 oz 251 lb 12.8 oz 243 lb  Weight (kg) 116.5 kg 114.216 kg 110.224 kg      Telemetry    Atrial fibrillation heart rate in the 70s- Personally Reviewed    Physical Exam  Alert, oriented, elderly male in no distress GEN: No acute distress.   Neck: No JVD Cardiac: Irregularly irregular, no murmurs, rubs, or gallops.  Respiratory: Decreased breath sounds in the bases bilaterally GI:  Soft, nontender, non-distended  MS: 1+ ankle edema; No deformity. Neuro:  Nonfocal  Psych: Normal affect   Labs    High Sensitivity Troponin:   Recent Labs  Lab 11/06/22 1111 11/06/22 1343 11/06/22 1607  TROPONINIHS 33* 148* 500*     Chemistry Recent Labs  Lab 11/06/22 1111 11/06/22 1129 11/06/22 1402 11/06/22 1904 11/07/22 0512  NA 137 143  143 138  --  136  K 3.8 3.4*  3.3* 4.1  --  4.3  CL 103 105  --   --  100  CO2 22  --   --   --  24  GLUCOSE 148* 131*  --   --  130*  BUN 33* 36*  --   --  35*  CREATININE 1.60* 1.40*  --  1.77* 2.10*  CALCIUM 8.7*  --   --   --  9.2  MG 2.1  --   --   --  2.0  PROT 6.8  --   --   --  6.0*  ALBUMIN 3.7  --   --   --  3.3*  AST 28  --   --   --  46*  ALT 13  --   --   --  22  ALKPHOS 79  --   --   --  65  BILITOT 0.9  --   --   --  1.7*  GFRNONAA 42*  --   --  37* 30*  ANIONGAP 12  --   --   --  12    Lipids No results for input(s): "CHOL", "TRIG", "HDL", "LABVLDL", "LDLCALC", "CHOLHDL" in the last 168 hours.  Hematology Recent Labs  Lab 11/06/22 1111 11/06/22 1129 11/06/22 1402 11/06/22 1904 11/07/22 0121  WBC 11.1*  --   --  13.8* 9.6  RBC 4.19*  --   --  4.00* 3.50*  HGB 12.2*   < > 12.2* 11.6* 10.5*  HCT 39.0   < > 36.0* 37.2* 32.9*  MCV 93.1  --   --  93.0 94.0  MCH 29.1  --   --  29.0 30.0  MCHC 31.3  --   --  31.2 31.9  RDW 15.9*  --   --  15.7* 15.9*  PLT 178  --   --  237 196   < > = values in this interval not displayed.   Thyroid No results for input(s): "TSH", "FREET4" in the last 168 hours.  BNP Recent Labs  Lab 11/06/22 1121  BNP 840.3*    DDimer No results for input(s): "DDIMER" in the last 168 hours.   Radiology    VAS [place] LOWER EXTREMITY VENOUS (DVT)  Result Date: 11/06/2022  Lower Venous DVT Study Patient Name:  Shawn Blanchard  Date of Exam:   11/06/2022 Medical Rec #: [id number]           Accession #:    [id number] Date of Birth: July 15, 1935          Patient Gender: M Patient Age:   87  years Exam Location:  [facility] Procedure:      VAS [place] LOWER EXTREMITY VENOUS (DVT) Referring Phys: Emilie Rutter --------------------------------------------------------------------------------  Indications: Edema.  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 Anticoagulation: Heparin. Comparison Study: No prior study Performing Technologist: Shona Simpson  Examination Guidelines: A complete evaluation includes B-mode imaging, spectral Doppler, color Doppler, and power Doppler as needed of all accessible portions of each vessel. Bilateral testing is considered an integral part of a complete examination. Limited examinations for reoccurring indications may be performed as noted. The reflux portion of the exam is performed with the patient in reverse Trendelenburg.  +---------+---------------+---------+-----------+----------+--------------+ RIGHT    CompressibilityPhasicitySpontaneityPropertiesThrombus Aging +---------+---------------+---------+-----------+----------+--------------+ CFV      Full           Yes      Yes                                 +---------+---------------+---------+-----------+----------+--------------+ SFJ      Full                                                        +---------+---------------+---------+-----------+----------+--------------+ FV Prox  Full                                                        +---------+---------------+---------+-----------+----------+--------------+  FV Mid   Full                                                        +---------+---------------+---------+-----------+----------+--------------+ FV DistalFull                                                        +---------+---------------+---------+-----------+----------+--------------+ PFV      Full                                                        +---------+---------------+---------+-----------+----------+--------------+ POP      Full            Yes      Yes                                 +---------+---------------+---------+-----------+----------+--------------+ PTV      Full                                                        +---------+---------------+---------+-----------+----------+--------------+ PERO     Full                                                        +---------+---------------+---------+-----------+----------+--------------+   +---------+---------------+---------+-----------+----------+--------------+ LEFT     CompressibilityPhasicitySpontaneityPropertiesThrombus Aging +---------+---------------+---------+-----------+----------+--------------+ CFV      Full           Yes      Yes                                 +---------+---------------+---------+-----------+----------+--------------+ SFJ      Full                                                        +---------+---------------+---------+-----------+----------+--------------+ FV Prox  Full                                                        +---------+---------------+---------+-----------+----------+--------------+ FV Mid   Full                                                        +---------+---------------+---------+-----------+----------+--------------+  FV DistalFull                                                        +---------+---------------+---------+-----------+----------+--------------+ PFV      Full                                                        +---------+---------------+---------+-----------+----------+--------------+ POP      Full           Yes      Yes                                 +---------+---------------+---------+-----------+----------+--------------+ PTV      Full                                                        +---------+---------------+---------+-----------+----------+--------------+ PERO     Full                                                         +---------+---------------+---------+-----------+----------+--------------+     Summary: BILATERAL: - No evidence of deep vein thrombosis seen in the lower extremities, bilaterally. - RIGHT: - A cystic structure is found in the popliteal fossa.  LEFT: - No cystic structure found in the popliteal fossa.  *See table(s) above for measurements and observations. Electronically signed by Heath Lark on 11/06/2022 at 5:32:04 PM.    Final    DG Chest Portable 1 View  Result Date: 11/06/2022 CLINICAL DATA:  Cardiac arrest. EXAM: PORTABLE CHEST 1 VIEW COMPARISON:  Chest x-ray dated Aug 11, 2021. FINDINGS: Endotracheal tube tip in good position approximately 2.2 cm above the carina. Enteric tube tip in the stomach. Mild cardiomegaly. Layering bilateral pleural effusions, greater on the right. No pneumothorax. No acute osseous abnormality. IMPRESSION: 1. Appropriately positioned endotracheal and enteric tubes. 2. Layering bilateral pleural effusions, greater on the right. Electronically Signed   By: Obie Dredge M.D.   On: 11/06/2022 12:38   CT HEMATURIA WORKUP  Result Date: 11/06/2022 CLINICAL DATA:  Microscopic hematuria.  Status post cardiac arrest. EXAM: CT ABDOMEN AND PELVIS WITHOUT AND WITH CONTRAST TECHNIQUE: Multidetector CT imaging of the abdomen and pelvis was performed following the standard protocol before and following the bolus administration of intravenous contrast. RADIATION DOSE REDUCTION: This exam was performed according to the departmental dose-optimization program which includes automated exposure control, adjustment of the mA and/or kV according to patient size and/or use of iterative reconstruction technique. CONTRAST:  OMNIPAQUE IOHEXOL 300 MG/ML  SOLN COMPARISON:  08/15/2021 FINDINGS: Lower chest: Moderate right pleural effusion and small left effusion identified. No airspace consolidation. Trace pericardial effusion. Aortic atherosclerosis and coronary artery calcifications.  Hepatobiliary: There is no focal liver abnormality. Status post cholecystectomy. No bile duct  dilatation. Pancreas: Unremarkable. No pancreatic ductal dilatation or surrounding inflammatory changes. Spleen: Normal in size without focal abnormality. Adrenals/Urinary Tract: Normal adrenal glands. There is no nephrolithiasis, hydronephrosis or suspicious kidney mass. No signs of hydroureter. Decompressed urinary bladder contains a small amount of excreted contrast material. No bladder calculi or enhancing bladder lesions identified. Stomach/Bowel: Stomach is nondistended. There is no pathologic dilatation of the large or small bowel loops to indicate a bowel obstruction. Postoperative changes involving the anterior small bowel loops identified with anastomotic suture chain noted, image 31/5. No bowel wall thickening identified. The appendix is not confidently identified. Sigmoid diverticulosis without signs of acute diverticulitis. Vascular/Lymphatic: Aortic atherosclerosis without aneurysm. The upper abdominal vascularity is patent. There is diffuse edema within the scratch edema with soft tissue stranding identified within the mesentery and retroperitoneal fat. Small mesenteric and retroperitoneal lymph nodes are identified without signs of adenopathy. Reproductive: Unremarkable.  No mass identified. Other: Small to moderate volume of abdominal ascites is noted which extends over the liver, spleen and stomach. Fluid is also noted extending along bilateral pericolic gutters. No discrete fluid collection to suggest an abscess. No signs of pneumoperitoneum. Fat containing right inguinal hernia. Diffuse body wall edema is noted compatible with anasarca. Musculoskeletal: No acute or suspicious osseous findings. Mild thoracolumbar degenerative disc disease and lower lumbar facet arthropathy. IMPRESSION: 1. No nephrolithiasis, hydronephrosis or suspicious kidney mass. 2. Decompressed urinary bladder contains a small amount  of excreted contrast material. No bladder calculi or enhancing bladder lesions identified. 3. Small to moderate volume of abdominal ascites. 4. Diffuse body wall edema compatible with anasarca. 5. Moderate right pleural effusion and small left effusion. 6. Aortic Atherosclerosis (ICD10-I70.0). Electronically Signed   By: Signa Kell M.D.   On: 11/06/2022 11:21    Cardiac Studies   2D echocardiogram pending  Patient Profile     87 y.o. male with cardiopulmonary arrest during prone position and CT scan, initial rhythm PEA  Assessment & Plan    1.  PEA arrest: Suspect airway obstruction, diastolic heart failure combination contributing to cardiopulmonary arrest.  Patient extubated yesterday with remarkable recovery considering his advanced age and the need for CPR. 2.  Acute on chronic combined systolic and diastolic heart failure: Baseline LVEF 45 to 50%.  Today's echo currently pending.  Patient with some evidence of volume excess with mild lower extremity edema and bilateral pleural effusions.  Would diurese as tolerated. 3.  Acute kidney injury, likely secondary to ATN/hypoperfusion.  Creatinine trending up to 2.1 this morning.  Will need to be cautious with diuresis. 4.  Mildly elevated troponin suspect demand ischemia: No clinical evidence of ACS.  Await 2D echo. 5.  Permanent atrial fibrillation: Patient with wide-complex tachycardia yesterday after resuscitation and during ACLS.  Treated with IV amiodarone.  Patient did not have an arrhythmia related arrest at onset.  I do not think he needs amiodarone any longer.  Will discontinue this.  Resume Pradaxa tomorrow as long as his hematuria is clearing up.      For questions or updates, please contact [facility] Please consult [email or web address] for contact info under        Signed, Tonny Bollman, MD  11/07/2022, 8:56 AM

## 2022-11-07 NOTE — Evaluation (Signed)
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   Aortic atherosclerosis (HCC) 06/22/2021   Arthritis 03/12/2016   Blepharitis of both upper and lower eyelid 10/02/2018   BMI 35.0-35.9,adult 04/11/2016   Carpal tunnel syndrome of right wrist 03/25/2019   Chronic anticoagulation 10/11/2016   Chronic atrial fibrillation (HCC) 06/12/2015   Overview:  Managed CARDS   Chronic diastolic heart failure (HCC) 09/01/2015   Class 1 obesity 08/08/2021   Coronary artery disease involving native coronary artery of native heart with angina pectoris (HCC) 02/15/2015   Overview:  Hx of MI with stent to LAD 2002 MPS 2016 with EF 47% and no ischemia   Dermatochalasis of both upper eyelids 11/21/2016   Diabetic foot infection (HCC) 08/08/2021   Diabetic polyneuropathy associated with type 2 diabetes mellitus (HCC) 12/16/2018   2020   Disorder of sesamoid bone of foot    Essential hypertension 11/07/2017   Floppy eyelid syndrome of both eyes 10/05/2019   Ganglion cyst 11/27/2015   Overview:  2017: dorsum right hand   Gastroesophageal reflux disease without esophagitis 06/12/2015   Gout of left foot    History of lymphoma 05/17/2021   History of squamous cell carcinoma in situ 04/18/2021   Hyperlipidemia 04/11/2016   Hypertensive heart disease with heart failure (HCC) 02/15/2015   Hypertensive urgency 05/17/2021   Idiopathic chronic gout 05/17/2015   Insomnia 11/27/2015   Iron deficiency anemia 04/27/2021   Leukocytosis 04/11/2016   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   Male erectile disorder  06/17/2016   Meibomian gland dysfunction (MGD) of both eyes 10/02/2018   Normocytic anemia 05/17/2021   Obstructive sleep apnea 08/30/2015   Overview:  CPAP   Old MI (myocardial infarction)    Orthostatic dizziness 09/15/2017   2018: med related   Peripheral venous insufficiency 11/08/2020   Peritoneal adhesion 11/08/2020   Pseudophakia of both eyes 03/12/2016   Pulmonary hypertension (HCC) 08/30/2015   Overview:  Managed CARDS   Restless leg syndrome 08/30/2015   SBO (small bowel obstruction) (HCC) 12/14/2019   Sensorineural hearing loss (SNHL), bilateral 10/04/2019   Small bowel obstruction (HCC) 04/11/2016   Stage 3a chronic kidney disease (HCC) 06/13/2019   Statin intolerance 03/09/2021   Type 2 diabetes mellitus with neurologic complication, without long-term current use of insulin (HCC) 12/02/2014   Type 2 diabetes mellitus with stage 3a chronic kidney disease, without long-term current use of insulin (HCC) 02/22/2020   Ulcer of left foot with fat layer exposed (HCC)    UTI (urinary tract infection) 05/17/2021   Past Surgical History:  Past Surgical History:  Procedure Laterality Date   ABDOMINAL ADHESION SURGERY     x 2   BLEPHAROPLASTY Bilateral    CARPAL TUNNEL RELEASE Right 2021   CATARACT EXTRACTION Bilateral    CERVICAL SPINE SURGERY     CHOLECYSTECTOMY     CORONARY ANGIOPLASTY WITH STENT PLACEMENT  2000   IRRIGATION AND DEBRIDEMENT FOOT Left 08/11/2021   Procedure: IRRIGATION AND DEBRIDEMENT FOOT, AND BONE BIOPSY, PARTIAL SESMOIDECTOMY;  Surgeon: Shawn Blanchard, DPM;  Location: [facility];  Service: Podiatry;  Laterality: Left;   NASAL  SINUS SURGERY     ORIF TIBIAL SHAFT FRACTURE W/ PLATES AND SCREWS Left    PARTIAL KNEE ARTHROPLASTY Bilateral    SMALL INTESTINE SURGERY     Adenocarcinoma   TONSILLECTOMY     HPI:  Shawn Blanchard is a 87 y.o. M who was getting an outpatient CT scan for hematuria work-up.  Reportedly he was not breathing when staff checked on him after the  scan-he had be in the prone position.  A code blue was called for PEA arrest and he underwent 6-8 minutes of ACLS before ROSC, he then had some PAC's and converted to Vfib and was defibrillated twice.   Pt was intubated and transported to to [facility].  Required ETT for around 6 hours.  CXR 7/31 with bilateral pleural effusions.  Pt with extensive PMH notable for HFpEF, HTN, CAD, Afib,Type 2 DM, SBO, OSA, GERD, and follows with Dr. Wynona Neat and uses CPAP who has had recent hematuria and scrotal mass    Assessment / Plan / Recommendation  Clinical Impression  Pt presents with functional swallowing as assessed clincially.  Pt tolerated all consistencies trialed with no clinical s/s of aspiration, including large serial sips of thin liquid.  Pt consumed puree, solid cracker, and midday meal tray without any s/s of aspiration and exhibited good oral clearance of solids.  Family notes that he usually eats much more quickly.  Recommend continuing current diet.  Pt does not have any acute ST needs.  SLP will sign off.    Recommend continuing regular texture diet with thin liquids.  SLP Visit Diagnosis: Dysphagia, unspecified (R13.10)    Aspiration Risk  No limitations    Diet Recommendation Regular;Thin liquid    Liquid Administration via: Cup;Straw Medication Administration: Whole meds with liquid Supervision: Patient able to self feed;Staff to assist with self feeding Compensations: Slow rate;Small sips/bites Postural Changes: Seated upright at 90 degrees    Other  Recommendations Oral Care Recommendations: Oral care BID    Recommendations for follow up therapy are one component of a multi-disciplinary discharge planning process, led by the attending physician.  Recommendations may be updated based on patient status, additional functional criteria and insurance authorization.  Follow up Recommendations No SLP follow up      Assistance Recommended at Discharge    Functional Status Assessment  Patient has not had a recent decline in their functional status  Frequency and Duration  (N/A)          Prognosis Prognosis for improved oropharyngeal function:  (N/A)      Swallow Study   General Date of Onset: 11/06/22 HPI: Shawn Brisbin is a 87 y.o. M who was getting an outpatient CT scan for hematuria work-up.  Reportedly he was not breathing when staff checked on him after the scan-he had be in the prone position.  A code blue was called for PEA arrest and he underwent 6-8 minutes of ACLS before ROSC, he then had some PAC's and converted to Vfib and was defibrillated twice.   Pt was intubated and transported to to [facility].  Required ETT for around 6 hours.  CXR 7/31 with bilateral pleural effusions.  Pt with extensive PMH notable for HFpEF, HTN, CAD, Afib,Type 2 DM, SBO, OSA, GERD, and follows with Dr. Wynona Neat and uses CPAP who has had recent hematuria and scrotal mass Type of Study: Bedside Swallow Evaluation Previous Swallow Assessment: None Diet Prior to this Study: Regular;Thin liquids (Level 0) Temperature Spikes Noted: No Respiratory Status: Nasal cannula  History of Recent Intubation: Yes Total duration of intubation (days): 1 days (~6 hours) Date extubated: 11/06/22 Behavior/Cognition: Alert;Cooperative;Pleasant mood Oral Cavity Assessment: Within Functional Limits Oral Care Completed by SLP: No Oral Cavity - Dentition: Adequate natural dentition Vision: Functional for self-feeding Self-Feeding Abilities: Able to feed self Patient Positioning: Upright in bed Baseline Vocal Quality: Normal Volitional Cough:  (Fair 2/2 pain from CPR) Volitional Swallow: Able to elicit    Oral/Motor/Sensory Function Overall Oral Motor/Sensory Function: Within functional limits Facial ROM: Within Functional Limits Facial Symmetry: Within Functional Limits Lingual ROM: Within Functional Limits (decreased protrusion, but improved with effort) Lingual Symmetry: Within Functional  Limits Lingual Strength: Reduced Velum: Within Functional Limits (uvula removed in sleep apnea surgery) Mandible: Within Functional Limits   Ice Chips Ice chips: Not tested   Thin Liquid Thin Liquid: Within functional limits Presentation: Straw    Nectar Thick Nectar Thick Liquid: Not tested   Honey Thick Honey Thick Liquid: Not tested   Puree Puree: Within functional limits Presentation: Spoon   Solid     Solid: Within functional limits Presentation: Self Fed (SLP fed)      Kerrie Pleasure, MA, CCC-SLP Acute Rehabilitation Services Office: [phone] 11/07/2022,1:09 PM

## 2022-11-07 NOTE — Progress Notes (Signed)
Placed pt on CPAP +!4 as per home settings. If labored resp efforts continue will try on BiPAP.   11/07/22 2230  BiPAP/CPAP/SIPAP  $ Non-Invasive Home Ventilator  Subsequent  BiPAP/CPAP/SIPAP Pt Type Adult  BiPAP/CPAP/SIPAP Resmed  Mask Type Full face mask  Mask Size Medium  Respiratory Rate 14 breaths/min  EPAP 14 cmH2O  Flow Rate 4 lpm (bled in)  BiPAP/CPAP /SiPAP Vitals  Pulse Rate 65  Resp 14  MEWS Score/Color  MEWS Score 0  MEWS Score Color [place] Si

## 2022-11-07 NOTE — Progress Notes (Signed)
NAME:  Shawn Blanchard, MRN:  [id number], DOB:  Feb 26, 1936, LOS: 1 ADMISSION DATE:  11/06/2022, CONSULTATION DATE:  11/07/22 REFERRING MD:  EDP, CHIEF COMPLAINT:  Cardiac arrest   History of Present Illness:  Shawn Blanchard is a 87 y.o. M with extensive PMH as listed below but notable for HFpEF, HTN, CAD, Afib,Type 2 DM, SBO, OSA and follows with Dr. Wynona Neat and uses CPAP who has had recent hematuria and scrotal mass and was getting an outpatient CT scan for hematuria work-up.  Reportedly he was not breathing when staff checked on him after the scan-he had be in the prone position.  A code blue was called for PEA arrest and he underwent 6-8 minutes of ACLS before ROSC, he then had some PAC's and converted to Vfib and was defibrillated twice.   Pt was intubated and transported to to [facility].  He briefly required levophed before this was transitioned off.  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), Aortic atherosclerosis (HCC) (06/22/2021), Arthritis (03/12/2016), Blepharitis of both upper and lower eyelid (10/02/2018), BMI 35.0-35.9,adult (04/11/2016), Carpal tunnel syndrome of right wrist (03/25/2019), Chronic anticoagulation (10/11/2016), Chronic atrial fibrillation (HCC) (06/12/2015), Chronic diastolic heart failure (HCC) (08/65/7846), Class 1 obesity (08/08/2021), Coronary artery disease involving native coronary artery of native heart with angina pectoris (HCC) (02/15/2015), Dermatochalasis of both upper eyelids (11/21/2016), Diabetic foot infection (HCC) (08/08/2021), Diabetic polyneuropathy associated with type 2 diabetes mellitus (HCC) (12/16/2018), Disorder of sesamoid bone of foot, Essential hypertension (11/07/2017), 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), Ganglion cyst (11/27/2015), Gastroesophageal reflux  disease without esophagitis (06/12/2015), Gout of left foot, History of lymphoma (05/17/2021), 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), Hyperlipidemia (04/11/2016), Hypertensive heart disease with heart failure (HCC) (02/15/2015), Hypertensive urgency (05/17/2021), Idiopathic chronic gout (05/17/2015), Insomnia (11/27/2015), Iron deficiency anemia (04/27/2021), Leukocytosis (04/11/2016), 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), Male erectile disorder (06/17/2016), Meibomian gland dysfunction (MGD) of both eyes (10/02/2018), Normocytic anemia (05/17/2021), Obstructive sleep apnea (08/30/2015), Old MI (myocardial infarction), Orthostatic dizziness (09/15/2017), Peripheral venous insufficiency (11/08/2020), Peritoneal adhesion (11/08/2020), Pseudophakia of both eyes (03/12/2016), Pulmonary hypertension (HCC) (08/30/2015), Restless leg syndrome (08/30/2015), SBO (small bowel obstruction) (HCC) (12/14/2019), Sensorineural hearing loss (SNHL), bilateral (10/04/2019), Small bowel obstruction (HCC) (04/11/2016), Stage 3a chronic kidney disease (HCC) (06/13/2019), Statin intolerance (03/09/2021), Type 2 diabetes mellitus with neurologic complication, without long-term current use of insulin (HCC) (12/02/2014), Type 2 diabetes mellitus with stage 3a chronic kidney disease, without long-term current use of insulin (HCC) (02/22/2020), Ulcer of left foot with fat layer exposed (HCC), and UTI (urinary tract infection) (05/17/2021).   Significant Hospital Events: Including procedures, antibiotic start and stop dates in addition to other pertinent events   7/31 code blue, PEA 6-8 minutes ACLS then Vfib, no pressors intubated  8/1 recovering quite well, extubated and awake, stable  Interim History / Subjective:  Extubated to room air, no neuro deficits  Chest is sore, creatinine up but making urine Echo with EF 45-50%  Objective   Blood pressure (!) 102/44, pulse 61, temperature 99.3 F (37.4 C), resp. rate 13,  height 5\' 11"  (1.803 m), weight 116.5 kg, SpO2 95%.    Vent Mode: CPAP;PSV FiO2 (%):  [40 %] 40 % Set Rate:  [16 bmp] 16 bmp Vt Set:  [600 mL] 600 mL PEEP:  [5 cmH20] 5 cmH20  Plateau Pressure:  [23 cmH20] 23 cmH20   Intake/Output Summary (Last 24 hours) at 11/07/2022 1508 Last data filed at 11/07/2022 1000 Gross per 24 hour  Intake 1041.46 ml  Output 285 ml  Net 756.46 ml   Filed Weights   11/07/22 0654  Weight: 116.5 kg    General:  elderly M awake and in no distress HEENT: MM pink/moist, sclera anicteric Neuro: waking up and following commands on Propofol CV: s1s2 rrr, no m/r/g PULM:  clear bilaterally on room air without distress, chest wall sore GI: soft, non-tender  Extremities: warm/dry, 1+ edema  Skin: no rashes or lesions   Labs; WBC 9.6 Hgb 10.5 Glu 142 Trop 33 Creatinine 2.0   Resolved Hospital Problem list     Assessment & Plan:    Witnessed in hospital PEA cardiac arrest with transient Vfib after ROSC and subsequent acute hypoxic respiratory failure  History of OSA, CAD and HFpEF Most suspicious that pt had an obstruction event while laying prone for CT leading to respiratory arrest Echo with unchanged EF 45-50 -trend trop, start heparin and Asa, supportive care with normothermia, euglycemia  -LE dopplers-no DVT -seen by cardiology and low concern for ACS, amiodarone d/c'd  -cautious diuresis given AKI -stable for transfer out of the ICU -no anti-platelet therapy for now in the setting of hematuria -PT consult    Type 2 DM -SSI   AKI Baseline creatinine appears to be around 1.4, now up to 2.1 likely secondary to hypoperfusion in setting of code -trend renal indices and monitor UOP and electrolytes  -hold further lasix today  Hematuria Being worked up outpatient for this -significant hematuria this AM, leave foley to ensure no clots  Best Practice (right click and "Reselect all SmartList Selections" daily)   Diet/type: Regular  consistency (see orders) DVT prophylaxis: systemic heparin GI prophylaxis: H2B Lines: N/A Foley:  Yes, and it is still needed Code Status:  full code Last date of multidisciplinary goals of care discussion [duaghter updated at the bedside 8/1]  Labs   CBC: Recent Labs  Lab 11/06/22 1111 11/06/22 1129 11/06/22 1402 11/06/22 1904 11/07/22 0121  WBC 11.1*  --   --  13.8* 9.6  NEUTROABS 5.2  --   --   --   --   HGB 12.2* 12.6*  12.6* 12.2* 11.6* 10.5*  HCT 39.0 37.0*  37.0* 36.0* 37.2* 32.9*  MCV 93.1  --   --  93.0 94.0  PLT 178  --   --  237 196    Basic Metabolic Panel: Recent Labs  Lab 11/06/22 1111 11/06/22 1129 11/06/22 1402 11/06/22 1904 11/07/22 0512  NA 137 143  143 138  --  136  K 3.8 3.4*  3.3* 4.1  --  4.3  CL 103 105  --   --  100  CO2 22  --   --   --  24  GLUCOSE 148* 131*  --   --  130*  BUN 33* 36*  --   --  35*  CREATININE 1.60* 1.40*  --  1.77* 2.10*  CALCIUM 8.7*  --   --   --  9.2  MG 2.1  --   --   --  2.0  PHOS  --   --   --   --  4.7*   GFR: Estimated Creatinine Clearance: 34 mL/min (A) (by C-G formula based on SCr of 2.1 mg/dL (H)). Recent Labs  Lab 11/06/22 1111 11/06/22 1344 11/06/22 1904 11/07/22  0121  WBC 11.1*  --  13.8* 9.6  LATICACIDVEN  --  3.9*  --  2.2*    Liver Function Tests: Recent Labs  Lab 11/06/22 1111 11/07/22 0512  AST 28 46*  ALT 13 22  ALKPHOS 79 65  BILITOT 0.9 1.7*  PROT 6.8 6.0*  ALBUMIN 3.7 3.3*   No results for input(s): "LIPASE", "AMYLASE" in the last 168 hours. No results for input(s): "AMMONIA" in the last 168 hours.  ABG    Component Value Date/Time   PHART 7.420 11/06/2022 1402   PCO2ART 33.9 11/06/2022 1402   PO2ART 203 (H) 11/06/2022 1402   HCO3 22.0 11/06/2022 1402   TCO2 23 11/06/2022 1402   ACIDBASEDEF 2.0 11/06/2022 1402   O2SAT 100 11/06/2022 1402     Coagulation Profile: No results for input(s): "INR", "PROTIME" in the last 168 hours.  Cardiac Enzymes: No results for  input(s): "CKTOTAL", "CKMB", "CKMBINDEX", "TROPONINI" in the last 168 hours.  HbA1C: Hgb A1c MFr Bld  Date/Time Value Ref Range Status  10/31/2022 08:40 AM 5.4 4.6 - 6.5 % Final    Comment:    Glycemic Control Guidelines for People with Diabetes:Non Diabetic:  <6%Goal of Therapy: <7%Additional Action Suggested:  >8%   07/25/2022 09:22 AM 5.9 4.6 - 6.5 % Final    Comment:    Glycemic Control Guidelines for People with Diabetes:Non Diabetic:  <6%Goal of Therapy: <7%Additional Action Suggested:  >8%     CBG: Recent Labs  Lab 11/07/22 0020 11/07/22 0456 11/07/22 0808 11/07/22 1148 11/07/22 1336  GLUCAP 116* 116* 126* 137* 142*    Review of Systems:   Unable to obtain  Past Medical History:  He,  has a past medical history of Acute on chronic systolic CHF (congestive heart failure) (HCC) (11/07/2017), 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), Aortic atherosclerosis (HCC) (06/22/2021), Arthritis (03/12/2016), Blepharitis of both upper and lower eyelid (10/02/2018), BMI 35.0-35.9,adult (04/11/2016), Carpal tunnel syndrome of right wrist (03/25/2019), Chronic anticoagulation (10/11/2016), Chronic atrial fibrillation (HCC) (06/12/2015), Chronic diastolic heart failure (HCC) (41/32/4401), Class 1 obesity (08/08/2021), Coronary artery disease involving native coronary artery of native heart with angina pectoris (HCC) (02/15/2015), Dermatochalasis of both upper eyelids (11/21/2016), Diabetic foot infection (HCC) (08/08/2021), Diabetic polyneuropathy associated with type 2 diabetes mellitus (HCC) (12/16/2018), Disorder of sesamoid bone of foot, Essential hypertension (11/07/2017), 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), Ganglion cyst (11/27/2015), Gastroesophageal reflux disease without esophagitis (06/12/2015), Gout of left foot, History of lymphoma (05/17/2021), 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), Hyperlipidemia (04/11/2016), Hypertensive heart disease with heart failure  (HCC) (02/15/2015), Hypertensive urgency (05/17/2021), Idiopathic chronic gout (05/17/2015), Insomnia (11/27/2015), Iron deficiency anemia (04/27/2021), Leukocytosis (04/11/2016), 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), Male erectile disorder (06/17/2016), Meibomian gland dysfunction (MGD) of both eyes (10/02/2018), Normocytic anemia (05/17/2021), Obstructive sleep apnea (08/30/2015), Old MI (myocardial infarction), Orthostatic dizziness (09/15/2017), Peripheral venous insufficiency (11/08/2020), Peritoneal adhesion (11/08/2020), Pseudophakia of both eyes (03/12/2016), Pulmonary hypertension (HCC) (08/30/2015), Restless leg syndrome (08/30/2015), SBO (small bowel obstruction) (HCC) (12/14/2019), Sensorineural hearing loss (SNHL), bilateral (10/04/2019), Small bowel obstruction (HCC) (04/11/2016), Stage 3a chronic kidney disease (HCC) (06/13/2019), Statin intolerance (03/09/2021), Type 2 diabetes mellitus with neurologic complication, without long-term current use of insulin (HCC) (12/02/2014), Type 2 diabetes mellitus with stage 3a chronic kidney disease, without long-term current use of insulin (HCC) (02/22/2020), Ulcer of left foot with fat layer exposed (HCC), and UTI (urinary tract infection) (05/17/2021).   Surgical History:   Past Surgical History:  Procedure Laterality Date   ABDOMINAL  ADHESION SURGERY     x 2   BLEPHAROPLASTY Bilateral    CARPAL TUNNEL RELEASE Right 2021   CATARACT EXTRACTION Bilateral    CERVICAL SPINE SURGERY     CHOLECYSTECTOMY     CORONARY ANGIOPLASTY WITH STENT PLACEMENT  2000   IRRIGATION AND DEBRIDEMENT FOOT Left 08/11/2021   Procedure: IRRIGATION AND DEBRIDEMENT FOOT, AND BONE BIOPSY, PARTIAL SESMOIDECTOMY;  Surgeon: Edwin Cap, DPM;  Location: [facility];  Service: Podiatry;  Laterality: Left;   NASAL SINUS SURGERY     ORIF TIBIAL SHAFT FRACTURE W/ PLATES AND SCREWS Left    PARTIAL KNEE ARTHROPLASTY Bilateral    SMALL INTESTINE SURGERY     Adenocarcinoma    TONSILLECTOMY       Social History:   reports that he has never smoked. He has never used smokeless tobacco. He reports that he does not currently use alcohol. He reports that he does not use drugs.   Family History:  His family history includes Cancer in his mother and paternal grandfather; Diabetes in his brother, brother, father, mother, and sister; Heart disease in his father and mother; Stroke in his father. There is no history of Colon cancer, Rectal cancer, Pancreatic cancer, Liver cancer, or Stomach cancer.   Allergies Allergies  Allergen Reactions   Ace Inhibitors Rash   Amoxicillin Nausea Only   Atorvastatin Other (See Comments)    Dizziness.   Jardiance [Empagliflozin] Other (See Comments)    Orthostasis   Meperidine Rash   Naproxen Sodium Rash   Sulfa Antibiotics Nausea Only   Tetracycline Nausea Only     Home Medications  Prior to Admission medications   Medication Sig Start Date End Date Taking? Authorizing Provider  albuterol (VENTOLIN HFA) 108 (90 Base) MCG/ACT inhaler Inhale 2 puffs into the lungs every 6 (six) hours as needed for wheezing or shortness of breath. 07/25/22   Loyola Mast, MD  allopurinol (ZYLOPRIM) 100 MG tablet TAKE 1 TABLET BY MOUTH EVERY DAY 09/04/22   Loyola Mast, MD  Alpha-Lipoic Acid 600 MG TABS Take 600 mg by mouth in the morning and at bedtime.    [provider]  b complex vitamins capsule Take 1 capsule by mouth daily.    [provider]  carvedilol (COREG) 3.125 MG tablet Take 1 tablet (3.125 mg total) by mouth 2 (two) times daily with a meal. 04/11/22   Munley, Iline Oven, MD  Cyanocobalamin (B-12) 1000 MCG SUBL Place 1,000 mg under the tongue.    [provider]  dabigatran (PRADAXA) 150 MG CAPS capsule Take 1 capsule (150 mg total) by mouth 2 (two) times daily. 09/18/22   Baldo Daub, MD  FEROSUL 325 (65 Fe) MG tablet TAKE 1 TABLET BY MOUTH 2 TIMES A DAY 09/25/22   Loyola Mast, MD  furosemide (LASIX) 20  MG tablet Take 2 tablets (40 mg total) by mouth daily with breakfast AND 1 tablet (20 mg total) daily before lunch. 04/26/22   Loyola Mast, MD  glucose blood test strip OneTouch Ultra Blue Test Strip    [provider]  hydrocortisone 2.5 % ointment Apply 1 application topically 2 (two) times daily as needed (Skin Irritation). 11/29/20   [provider]  lactobacillus acidophilus (BACID) TABS tablet Take 1 tablet by mouth 3 (three) times daily.    [provider]  Lancets (ONETOUCH DELICA PLUS Collier Bullock) MISC OneTouch Delica Plus Lancet 33 gauge    [provider]  loperamide (IMODIUM A-D)  2 MG tablet Take 2 mg by mouth 4 (four) times daily as needed for diarrhea or loose stools.    [provider]  Melatonin 5 MG CAPS Take 5 mg by mouth at bedtime.    [provider]  metFORMIN (GLUCOPHAGE-XR) 500 MG 24 hr tablet TAKE 2 TABLETS BY MOUTH EVERY DAY WITH DINNER (DIABETES) 08/15/22   Loyola Mast, MD  nystatin cream (MYCOSTATIN) Apply 1 Application topically 2 (two) times daily. 10/30/22   Stoneking, Danford Bad., MD  OVER THE COUNTER MEDICATION Take 25 mg by mouth daily. Balance of Nature: Fruits and Vegs supplements    [provider]  polyvinyl alcohol (LIQUIFILM TEARS) 1.4 % ophthalmic solution Place 1 drop into both eyes as needed for dry eyes.    [provider]  psyllium (METAMUCIL) 58.6 % packet Take 1 packet by mouth at bedtime. With a full glass of water    [provider]  REPATHA SURECLICK 140 MG/ML SOAJ INJECT 140 MG INTO THE SKIN EVERY 14 DAYS 08/28/22   Baldo Daub, MD  SitaGLIPtin-MetFORMIN HCl (JANUMET XR) [phone] MG TB24 TAKE 1 TABLET BY MOUTH EVERY DAY WITH BREAKFAST (FOR DIABETES) 05/16/22   Loyola Mast, MD  Spacer/Aero-Holding Deretha Emory DEVI Use with albuterol inhaler 08/13/21   Loyola Mast, MD  traMADol-acetaminophen (ULTRACET) 37.5-325 MG tablet Take by mouth. 08/15/22   [provider]      Critical care time:         Darcella Gasman Jonette Wassel, PA-C [facility] See Amion for pager If no response to pager , please call [phone] until 7pm After 7:00 pm call Elink  [phone]

## 2022-11-07 NOTE — Progress Notes (Signed)
Inpatient Rehab Admissions Coordinator Note:   Per therapy recommendations patient was screened for CIR candidacy by Stephania Fragmin, PT. At this time, pt appears to be a potential candidate for CIR. I will place an order for rehab consult for full assessment, per our protocol.  Please contact me any with questions.Estill Dooms, PT, DPT [phone] 11/07/22 4:06 PM

## 2022-11-07 DEATH — deceased

## 2022-11-08 DIAGNOSIS — E1142 Type 2 diabetes mellitus with diabetic polyneuropathy: Secondary | ICD-10-CM | POA: Diagnosis not present

## 2022-11-08 DIAGNOSIS — G4733 Obstructive sleep apnea (adult) (pediatric): Secondary | ICD-10-CM

## 2022-11-08 DIAGNOSIS — I5023 Acute on chronic systolic (congestive) heart failure: Secondary | ICD-10-CM | POA: Diagnosis not present

## 2022-11-08 DIAGNOSIS — N179 Acute kidney failure, unspecified: Secondary | ICD-10-CM

## 2022-11-08 DIAGNOSIS — I469 Cardiac arrest, cause unspecified: Secondary | ICD-10-CM | POA: Diagnosis not present

## 2022-11-08 LAB — BLOOD GAS, ARTERIAL
Acid-base deficit: 1.3 mmol/L (ref 0.0–2.0)
Bicarbonate: 22.7 mmol/L (ref 20.0–28.0)
O2 Saturation: 98.9 %
Patient temperature: 36.7
pCO2 arterial: 34 mmHg (ref 32–48)
pH, Arterial: 7.42 (ref 7.35–7.45)
pO2, Arterial: 102 mmHg (ref 83–108)

## 2022-11-08 LAB — GLUCOSE, CAPILLARY
Glucose-Capillary: 117 mg/dL — ABNORMAL HIGH (ref 70–99)
Glucose-Capillary: 118 mg/dL — ABNORMAL HIGH (ref 70–99)
Glucose-Capillary: 115 mg/dL — ABNORMAL HIGH (ref 70–99)
Glucose-Capillary: 124 mg/dL — ABNORMAL HIGH (ref 70–99)
Glucose-Capillary: 114 mg/dL — ABNORMAL HIGH (ref 70–99)

## 2022-11-08 MED ORDER — OXYCODONE HCL 5 MG PO TABS
5.0000 mg | ORAL_TABLET | Freq: Four times a day (QID) | ORAL | Status: DC | PRN
  Filled 2022-11-08: qty 1

## 2022-11-08 MED ORDER — FAMOTIDINE 20 MG PO TABS
20.0000 mg | ORAL_TABLET | Freq: Every day | ORAL | Status: DC
  Administered 2022-11-09: 20 mg via ORAL
  Filled 2022-11-08: qty 1

## 2022-11-08 NOTE — Plan of Care (Signed)

## 2022-11-08 NOTE — Progress Notes (Signed)
PROGRESS NOTE    Shawn Blanchard  GMW:[id number] DOB: 09-Feb-1936 DOA: 10/07/2022 PCP: Loyola Mast, MD  86/M, obese chronically ill with systolic CHF, EF 44%, history of CAD, paroxysmal A-fib, type 2 diabetes mellitus, sleep apnea recently undergoing workup for hematuria per Dr. Pete Glatter was getting an outpatient CT, subsequently found to be in prone position and not breathing, CODE BLUE called for PEA arrest, underwent 6 to 8 minutes of ACLS before ROSC, then converted to V-fib, shocked twice, intubated and transferred to [facility], briefly required Levophed. -Suspected to be respiratory arrest, extubated -Echo noted unchanged EF, no wall motion abnormality, seen by cardiology in consultation -Transferred to [facility] service today 8/2   Subjective: -Little tired, extended family at bedside, denies any complaints, had some chest discomfort from CPR yesterday  Assessment and Plan:   SP PEA arrest with transient V-fib Acute hypoxic respiratory failure -Multifactorial, primarily suspected to be an airway obstruction event, noted to be laying prone for CT leading to respiratory arrest -Seen by cards, low concern for ACS, amiodarone discontinued -Echo unchanged with EF 45-50%, no new wall motion abnormality -Clinically appears mildly of volume overloaded, resume diuretics today if kidney function tolerates, awaiting labs this morning -Ambulate, PT OT eval  Acute kidney injury -Likely cardiorenal, baseline creatinine 1.4, trended up to 2.1 yesterday -Awaiting labs this morning, restart Lasix today  Permanent A-fib -With V-fib after resuscitation, shocked X2 -Treated with amiodarone briefly, discontinued yesterday' -Resume Pradaxa tomorrow, hematuria improving  Mildly elevated troponin -Suspected to be demand ischemia, clinically do not suspect ACS, echo unchanged as noted above  Hematuria -Noted to have significant hematuria this admission, leave Foley catheter in for now -Pradaxa on  hold  Type 2 diabetes mellitus -CBGs are stable, continue sliding scale insulin  DVT prophylaxis: SCDs Code Status: Full code Family Communication: Family at bedside Disposition Plan: May need rehab  Consultants:    Procedures:   Antimicrobials:    Objective: Vitals:   11/07/22 2230 11/07/22 2350 11/08/22 0312 11/08/22 0716  BP:  (!) 122/49 (!) 143/52 (!) 136/59  Pulse: 65 (!) 57 71 67  Resp: 14 15 14 15   Temp:  98.3 F (36.8 C) 98 F (36.7 C)   TempSrc:  Oral Oral Oral  SpO2:  100% 100% 100%  Weight:   119.7 kg   Height:        Intake/Output Summary (Last 24 hours) at 11/08/2022 1005 Last data filed at 11/08/2022 0347 Gross per 24 hour  Intake 300 ml  Output 290 ml  Net 10 ml   Filed Weights   11/07/22 0654 11/08/22 0312  Weight: 116.5 kg 119.7 kg    Examination:  General exam: Obese clear ill male sitting up in bed, mildly tachypneic HEENT: Positive JVD CVS: S1-S2, irregular rhythm Lungs: Decreased breath sounds at the bases Abdomen: Firm, mildly distended, swollen, nontender, bowel sounds present Extremities: Trace edema  Skin: No rashes Psychiatry: Flat affect    Data Reviewed:   CBC: Recent Labs  Lab 10/10/2022 1111 10/25/2022 1129 10/25/2022 1402 10/15/2022 1904 11/07/22 0121  WBC 11.1*  --   --  13.8* 9.6  NEUTROABS 5.2  --   --   --   --   HGB 12.2* 12.6*  12.6* 12.2* 11.6* 10.5*  HCT 39.0 37.0*  37.0* 36.0* 37.2* 32.9*  MCV 93.1  --   --  93.0 94.0  PLT 178  --   --  237 196   Basic Metabolic Panel: Recent Labs  Lab  11/04/2022 1111 10/28/2022 1129 10/27/2022 1402 10/23/2022 1904 11/07/22 0512  NA 137 143  143 138  --  136  K 3.8 3.4*  3.3* 4.1  --  4.3  CL 103 105  --   --  100  CO2 22  --   --   --  24  GLUCOSE 148* 131*  --   --  130*  BUN 33* 36*  --   --  35*  CREATININE 1.60* 1.40*  --  1.77* 2.10*  CALCIUM 8.7*  --   --   --  9.2  MG 2.1  --   --   --  2.0  PHOS  --   --   --   --  4.7*   GFR: Estimated Creatinine  Clearance: 34.5 mL/min (A) (by C-G formula based on SCr of 2.1 mg/dL (H)). Liver Function Tests: Recent Labs  Lab 10/27/2022 1111 11/07/22 0512  AST 28 46*  ALT 13 22  ALKPHOS 79 65  BILITOT 0.9 1.7*  PROT 6.8 6.0*  ALBUMIN 3.7 3.3*   No results for input(s): "LIPASE", "AMYLASE" in the last 168 hours. No results for input(s): "AMMONIA" in the last 168 hours. Coagulation Profile: No results for input(s): "INR", "PROTIME" in the last 168 hours. Cardiac Enzymes: No results for input(s): "CKTOTAL", "CKMB", "CKMBINDEX", "TROPONINI" in the last 168 hours. BNP (last 3 results) Recent Labs    03/18/22 1445 07/25/22 0922  PROBNP 609.0* 534.0*   HbA1C: No results for input(s): "HGBA1C" in the last 72 hours. CBG: Recent Labs  Lab 11/07/22 1518 11/07/22 1951 11/07/22 2351 11/08/22 0440 11/08/22 0808  GLUCAP 115* 107* 121* 117* 118*   Lipid Profile: No results for input(s): "CHOL", "HDL", "LDLCALC", "TRIG", "CHOLHDL", "LDLDIRECT" in the last 72 hours. Thyroid Function Tests: No results for input(s): "TSH", "T4TOTAL", "FREET4", "T3FREE", "THYROIDAB" in the last 72 hours. Anemia Panel: No results for input(s): "VITAMINB12", "FOLATE", "FERRITIN", "TIBC", "IRON", "RETICCTPCT" in the last 72 hours. Urine analysis:    Component Value Date/Time   COLORURINE YELLOW 10/20/2022 1142   APPEARANCEUR HAZY (A) 10/23/2022 1142   APPEARANCEUR Cloudy (A) 10/30/2022 0000   LABSPEC 1.020 10/22/2022 1142   PHURINE 6.5 11/01/2022 1142   GLUCOSEU 100 (A) 10/25/2022 1142   GLUCOSEU NEGATIVE 09/24/2021 1502   HGBUR LARGE (A) 10/10/2022 1142   BILIRUBINUR SMALL (A) 10/10/2022 1142   BILIRUBINUR Negative 10/30/2022 0000   KETONESUR NEGATIVE 11/05/2022 1142   PROTEINUR >=300 (A) 11/02/2022 1142   UROBILINOGEN 0.2 10/02/2021 1038   UROBILINOGEN 0.2 09/24/2021 1502   NITRITE NEGATIVE 10/28/2022 1142   LEUKOCYTESUR NEGATIVE 10/22/2022 1142   Sepsis  Labs: @LABRCNTIP (procalcitonin:4,lacticidven:4)  ) Recent Results (from the past 240 hour(s))  Microscopic Examination     Status: Abnormal   Collection Time: 10/30/22 12:00 AM   Urine  Result Value Ref Range Status   WBC, UA 6-10 (A) 0 - 5 /hpf Final   RBC, Urine >30 (H) 0 - 2 /hpf Final   Epithelial Cells (non renal) 0-10 0 - 10 /hpf Final   Bacteria, UA Few (A) None seen/Few Final  SARS Coronavirus 2 by RT PCR (hospital order, performed in [facility] hospital lab) *cepheid single result test* Anterior Nasal Swab     Status: None   Collection Time: 10/12/2022 11:42 AM   Specimen: Anterior Nasal Swab  Result Value Ref Range Status   SARS Coronavirus 2 by RT PCR NEGATIVE NEGATIVE Final    Comment: (NOTE) SARS-CoV-2 target nucleic acids are  NOT DETECTED.  The SARS-CoV-2 RNA is generally detectable in upper and lower respiratory specimens during the acute phase of infection. The lowest concentration of SARS-CoV-2 viral copies this assay can detect is 250 copies / mL. A negative result does not preclude SARS-CoV-2 infection and should not be used as the sole basis for treatment or other patient management decisions.  A negative result may occur with improper specimen collection / handling, submission of specimen other than nasopharyngeal swab, presence of viral mutation(s) within the areas targeted by this assay, and inadequate number of viral copies (<250 copies / mL). A negative result must be combined with clinical observations, patient history, and epidemiological information.  Fact Sheet for Patients:   RoadLapTop.co.za  Fact Sheet for Healthcare Providers: http://kim-miller.com/  This test is not yet approved or  cleared by the [place] FDA and has been authorized for detection and/or diagnosis of SARS-CoV-2 by FDA under an Emergency Use Authorization (EUA).  This EUA will remain in effect (meaning this test can be used) for  the duration of the COVID-19 declaration under Section 564(b)(1) of the Act, 21 U.S.C. section 360bbb-3(b)(1), unless the authorization is terminated or revoked sooner.  Performed at [facility], [address] [place], [place] [address]   Urine Culture     Status: None   Collection Time: 10/08/2022 11:42 AM   Specimen: Urine, Clean Catch  Result Value Ref Range Status   Specimen Description   Final    URINE, CLEAN CATCH Performed at [facility], [address], [place], [place] [address]    Special Requests   Final    NONE Reflexed from [id number] Performed at [facility], [address] [place], [place] [address]    Culture   Final    NO GROWTH Performed at [facility], [address] [place], [place] [address]    Report Status 11/07/2022 FINAL  Final  MRSA Next Gen by PCR, Nasal     Status: Abnormal   Collection Time: 11/07/22  6:30 AM   Specimen: Nasal Mucosa; Nasal Swab  Result Value Ref Range Status   MRSA by PCR Next Gen DETECTED (A) NOT DETECTED Final    Comment: RESULT CALLED TO, READ BACK BY AND VERIFIED WITH: RN Lauro Regulus ON [id number] @0835  BY SM (NOTE) The GeneXpert MRSA Assay (FDA approved for NASAL specimens only), is one component of a comprehensive MRSA colonization surveillance program. It is not intended to diagnose MRSA infection nor to guide or monitor treatment for MRSA infections. Test performance is not FDA approved in patients less than 17 years old. Performed at [facility], [address], [place], [place] [address]      Radiology Studies: ECHOCARDIOGRAM COMPLETE  Result Date: 11/07/2022    ECHOCARDIOGRAM REPORT   Patient Name:   JOHNCARLOS HOLTSCLAW Date of Exam: 11/07/2022 Medical Rec #:  [id number]          Height:       71.0 in Accession #:    [id number]         Weight:       256.8 lb Date of Birth:  11-Jan-1936         BSA:          2.345 m Patient Age:    86 years           BP:           114/52 mmHg Patient  Gender: M                  HR:           60 bpm. Exam Location:  Inpatient Procedure: 2D Echo, Color Doppler, Cardiac Doppler and Intracardiac            Opacification Agent Indications:    Cardiac Arrest  History:        Patient has prior history of Echocardiogram examinations, most                 recent 08/09/2021. CHF; Risk Factors:Hypertension and                 Dyslipidemia.  Sonographer:    Harriette Bouillon RDCS Referring Phys: [phone] LAURA R GLEASON IMPRESSIONS  1. Left ventricular ejection fraction, by estimation, is 45 to 50%. The left ventricle has mildly decreased function. Left ventricular endocardial border not optimally defined to evaluate regional wall motion. Left ventricular diastolic function could not be evaluated.  2. Right ventricular systolic function was not well visualized. The right ventricular size is not well visualized.  3. The mitral valve was not well visualized. No evidence of mitral valve regurgitation.  4. Tricuspid valve regurgitation Not well visualized.  5. The aortic valve was not well visualized. There is mild calcification of the aortic valve. There is mild thickening of the aortic valve. Aortic valve regurgitation is trivial. Aortic valve sclerosis is present, with no evidence of aortic valve stenosis. Comparison(s): No significant change from prior study. Conclusion(s)/Recommendation(s): Technically limited study, even with use of echo contrast. LVEF low normal without ability to evaluate for focal wall motion. FINDINGS  Left Ventricle: Left ventricular ejection fraction, by estimation, is 45 to 50%. The left ventricle has mildly decreased function. Left ventricular endocardial border not optimally defined to evaluate regional wall motion. Definity contrast agent was given IV to delineate the left ventricular endocardial borders. The left ventricular internal cavity size was normal in size. Suboptimal image quality limits for assessment of left ventricular hypertrophy. Left  ventricular diastolic function could not be  evaluated. Right Ventricle: The right ventricular size is not well visualized. Right vetricular wall thickness was not well visualized. Right ventricular systolic function was not well visualized. Left Atrium: Left atrial size was not well visualized. Right Atrium: Right atrial size was not well visualized. Pericardium: There is no evidence of pericardial effusion. Mitral Valve: The mitral valve was not well visualized. There is mild thickening of the mitral valve leaflet(s). There is mild calcification of the mitral valve leaflet(s). Mild to moderate mitral annular calcification. No evidence of mitral valve regurgitation. Tricuspid Valve: The tricuspid valve is not well visualized. Tricuspid valve regurgitation Not well visualized. Aortic Valve: The aortic valve was not well visualized. There is mild calcification of the aortic valve. There is mild thickening of the aortic valve. Aortic valve regurgitation is trivial. Aortic valve sclerosis is present, with no evidence of aortic valve stenosis. Pulmonic Valve: The pulmonic valve was not well visualized. Pulmonic valve regurgitation is not visualized. Aorta: The aortic root was not well visualized, the ascending aorta was not well visualized and the aortic arch was not well visualized. IAS/Shunts: The interatrial septum was not well visualized.  LEFT VENTRICLE PLAX 2D LVIDd:         5.80 cm LVIDs:         2.90 cm LV IVS:        1.10 cm LVOT diam:     2.40  cm LV SV:         47 LV SV Index:   20 LVOT Area:     4.52 cm  LEFT ATRIUM           Index        RIGHT ATRIUM           Index LA diam:      4.60 cm 1.96 cm/m   RA Area:     24.00 cm LA Vol (A4C): 64.2 ml 27.38 ml/m  RA Volume:   84.90 ml  36.20 ml/m  AORTIC VALVE LVOT Vmax:   54.00 cm/s LVOT Vmean:  40.800 cm/s LVOT VTI:    0.104 m  AORTA Ao Root diam: 3.00 cm Ao Asc diam:  3.30 cm MITRAL VALVE MV Area (PHT): 4.86 cm     SHUNTS MV Decel Time: 156 msec     Systemic  VTI:  0.10 m MV E velocity: 104.00 cm/s  Systemic Diam: 2.40 cm MV A velocity: 43.50 cm/s MV E/A ratio:  2.39 Jodelle Red MD Electronically signed by Jodelle Red MD Signature Date/Time: 11/07/2022/1:15:44 PM    Final    VAS [place] LOWER EXTREMITY VENOUS (DVT)  Result Date: 11/03/2022  Lower Venous DVT Study Patient Name:  TYREZ BERRIOS  Date of Exam:   10/17/2022 Medical Rec #: [id number]           Accession #:    [id number] Date of Birth: 1935/04/19          Patient Gender: M Patient Age:   65 years Exam Location:  [facility] Procedure:      VAS [place] LOWER EXTREMITY VENOUS (DVT) Referring Phys: Emilie Rutter --------------------------------------------------------------------------------  Indications: Edema.  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 Anticoagulation: Heparin. Comparison Study: No prior study Performing Technologist: Shona Simpson  Examination Guidelines: A complete evaluation includes B-mode imaging, spectral Doppler, color Doppler, and power Doppler as needed of all accessible portions of each vessel. Bilateral testing is considered an integral part of a complete examination. Limited examinations for reoccurring indications may be performed as noted. The reflux portion of the exam is performed with the patient in reverse Trendelenburg.  +---------+---------------+---------+-----------+----------+--------------+ RIGHT    CompressibilityPhasicitySpontaneityPropertiesThrombus Aging +---------+---------------+---------+-----------+----------+--------------+ CFV      Full           Yes      Yes                                 +---------+---------------+---------+-----------+----------+--------------+ SFJ      Full                                                        +---------+---------------+---------+-----------+----------+--------------+ FV Prox  Full                                                         +---------+---------------+---------+-----------+----------+--------------+ FV Mid   Full                                                        +---------+---------------+---------+-----------+----------+--------------+  FV DistalFull                                                        +---------+---------------+---------+-----------+----------+--------------+ PFV      Full                                                        +---------+---------------+---------+-----------+----------+--------------+ POP      Full           Yes      Yes                                 +---------+---------------+---------+-----------+----------+--------------+ PTV      Full                                                        +---------+---------------+---------+-----------+----------+--------------+ PERO     Full                                                        +---------+---------------+---------+-----------+----------+--------------+   +---------+---------------+---------+-----------+----------+--------------+ LEFT     CompressibilityPhasicitySpontaneityPropertiesThrombus Aging +---------+---------------+---------+-----------+----------+--------------+ CFV      Full           Yes      Yes                                 +---------+---------------+---------+-----------+----------+--------------+ SFJ      Full                                                        +---------+---------------+---------+-----------+----------+--------------+ FV Prox  Full                                                        +---------+---------------+---------+-----------+----------+--------------+ FV Mid   Full                                                        +---------+---------------+---------+-----------+----------+--------------+ FV DistalFull                                                         +---------+---------------+---------+-----------+----------+--------------+  PFV      Full                                                        +---------+---------------+---------+-----------+----------+--------------+ POP      Full           Yes      Yes                                 +---------+---------------+---------+-----------+----------+--------------+ PTV      Full                                                        +---------+---------------+---------+-----------+----------+--------------+ PERO     Full                                                        +---------+---------------+---------+-----------+----------+--------------+     Summary: BILATERAL: - No evidence of deep vein thrombosis seen in the lower extremities, bilaterally. - RIGHT: - A cystic structure is found in the popliteal fossa.  LEFT: - No cystic structure found in the popliteal fossa.  *See table(s) above for measurements and observations. Electronically signed by Heath Lark on 10/07/2022 at 5:32:04 PM.    Final    DG Chest Portable 1 View  Result Date: 11/05/2022 CLINICAL DATA:  Cardiac arrest. EXAM: PORTABLE CHEST 1 VIEW COMPARISON:  Chest x-ray dated Aug 11, 2021. FINDINGS: Endotracheal tube tip in good position approximately 2.2 cm above the carina. Enteric tube tip in the stomach. Mild cardiomegaly. Layering bilateral pleural effusions, greater on the right. No pneumothorax. No acute osseous abnormality. IMPRESSION: 1. Appropriately positioned endotracheal and enteric tubes. 2. Layering bilateral pleural effusions, greater on the right. Electronically Signed   By: Obie Dredge M.D.   On: 10/22/2022 12:38   CT HEMATURIA WORKUP  Result Date: 11/04/2022 CLINICAL DATA:  Microscopic hematuria.  Status post cardiac arrest. EXAM: CT ABDOMEN AND PELVIS WITHOUT AND WITH CONTRAST TECHNIQUE: Multidetector CT imaging of the abdomen and pelvis was performed following the standard protocol before  and following the bolus administration of intravenous contrast. RADIATION DOSE REDUCTION: This exam was performed according to the departmental dose-optimization program which includes automated exposure control, adjustment of the mA and/or kV according to patient size and/or use of iterative reconstruction technique. CONTRAST:  OMNIPAQUE IOHEXOL 300 MG/ML  SOLN COMPARISON:  08/15/2021 FINDINGS: Lower chest: Moderate right pleural effusion and small left effusion identified. No airspace consolidation. Trace pericardial effusion. Aortic atherosclerosis and coronary artery calcifications. Hepatobiliary: There is no focal liver abnormality. Status post cholecystectomy. No bile duct dilatation. Pancreas: Unremarkable. No pancreatic ductal dilatation or surrounding inflammatory changes. Spleen: Normal in size without focal abnormality. Adrenals/Urinary Tract: Normal adrenal glands. There is no nephrolithiasis, hydronephrosis or suspicious kidney mass. No signs of hydroureter. Decompressed urinary bladder contains a small amount of excreted contrast material. No bladder calculi or enhancing bladder lesions identified. Stomach/Bowel: Stomach is nondistended.  There is no pathologic dilatation of the large or small bowel loops to indicate a bowel obstruction. Postoperative changes involving the anterior small bowel loops identified with anastomotic suture chain noted, image 31/5. No bowel wall thickening identified. The appendix is not confidently identified. Sigmoid diverticulosis without signs of acute diverticulitis. Vascular/Lymphatic: Aortic atherosclerosis without aneurysm. The upper abdominal vascularity is patent. There is diffuse edema within the scratch edema with soft tissue stranding identified within the mesentery and retroperitoneal fat. Small mesenteric and retroperitoneal lymph nodes are identified without signs of adenopathy. Reproductive: Unremarkable.  No mass identified. Other: Small to moderate  volume of abdominal ascites is noted which extends over the liver, spleen and stomach. Fluid is also noted extending along bilateral pericolic gutters. No discrete fluid collection to suggest an abscess. No signs of pneumoperitoneum. Fat containing right inguinal hernia. Diffuse body wall edema is noted compatible with anasarca. Musculoskeletal: No acute or suspicious osseous findings. Mild thoracolumbar degenerative disc disease and lower lumbar facet arthropathy. IMPRESSION: 1. No nephrolithiasis, hydronephrosis or suspicious kidney mass. 2. Decompressed urinary bladder contains a small amount of excreted contrast material. No bladder calculi or enhancing bladder lesions identified. 3. Small to moderate volume of abdominal ascites. 4. Diffuse body wall edema compatible with anasarca. 5. Moderate right pleural effusion and small left effusion. 6. Aortic Atherosclerosis (ICD10-I70.0). Electronically Signed   By: Signa Kell M.D.   On: 10/18/2022 11:21     Scheduled Meds:  acetaminophen  650 mg Oral Q6H   Chlorhexidine Gluconate Cloth  6 each Topical Daily   docusate sodium  100 mg Oral BID   famotidine  20 mg Oral BID   heparin  5,000 Units Subcutaneous Q8H   insulin aspart  0-15 Units Subcutaneous Q4H   lidocaine  1 patch Transdermal Q24H   mupirocin ointment   Nasal BID   polyethylene glycol  17 g Oral Daily   Continuous Infusions:   LOS: 2 days    Time spent:    Zannie Cove, MD [facility]   11/08/2022, 10:05 AM

## 2022-11-08 NOTE — Care Management Important Message (Signed)
Important Message  Patient Details  Name: Shawn Blanchard MRN: [id number] Date of Birth: 1935-07-10   Medicare Important Message Given:  Yes     Renie Ora 11/08/2022, 10:31 AM

## 2022-11-08 NOTE — Consult Note (Addendum)
Physical Medicine and Rehabilitation Consult Reason for Consult: Rehab Referring Physician: Dr. Jomarie Longs   HPI: Shawn Blanchard is a 87 y.o. male with past medical history of CHF, diabetes mellitus, A-fib, CAD, hypertension who had a PEA arrest while getting an outpatient CT scan for hematuria workup.  He had several minutes of ACLS before ROSC.  He then had PACs and converted to V-fib and was defibrillated twice.  He had an echo on 8/1 that showed EF 45 to 50%.  Patient has AKI felt to be cardiorenal in origin.  Elevated troponins felt to be due to demand ischemia.  He has a Foley catheter and has been noted to have hematuria.  He is tolerating a heart healthy diet with thin liquids.  PEA arrest felt to be related to airway obstruction, hypoxia during CT scan.  Patient has been in extubated.  He has been noted to have intermittent confusion.  PT evaluation patient was evaluated by physical therapy and found to have functional deficits.  Patient's daughter reports that he lives in a one-story home with a basement.  Steps to enter the home.  His wife can assist full-time after discharge.  His daughter can also provide some assistance.  At home he was using a rollator for ambulation.  Required rest breaks frequently.  Daughter reports he was independent with ADLs.  Required assistance with a IADLs   Review of Systems  Constitutional:  Negative for fever.  Eyes:  Negative for double vision.  Respiratory:  Negative for cough and shortness of breath.   Cardiovascular:  Negative for chest pain.  Gastrointestinal:  Negative for abdominal pain.  Musculoskeletal:  Negative for joint pain.  Skin:  Negative for rash.  Neurological:  Positive for weakness. Negative for sensory change.   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   Aortic atherosclerosis (HCC) 06/22/2021    Arthritis 03/12/2016   Blepharitis of both upper and lower eyelid 10/02/2018   BMI 35.0-35.9,adult 04/11/2016   Carpal tunnel syndrome of right wrist 03/25/2019   Chronic anticoagulation 10/11/2016   Chronic atrial fibrillation (HCC) 06/12/2015   Overview:  Managed CARDS   Chronic diastolic heart failure (HCC) 09/01/2015   Class 1 obesity 08/08/2021   Coronary artery disease involving native coronary artery of native heart with angina pectoris (HCC) 02/15/2015   Overview:  Hx of MI with stent to LAD 2002 MPS 2016 with EF 47% and no ischemia   Dermatochalasis of both upper eyelids 11/21/2016   Diabetic foot infection (HCC) 08/08/2021   Diabetic polyneuropathy associated with type 2 diabetes mellitus (HCC) 12/16/2018   2020   Disorder of sesamoid bone of foot    Essential hypertension 11/07/2017   Floppy eyelid syndrome of both eyes 10/05/2019   Ganglion cyst 11/27/2015   Overview:  2017: dorsum right hand   Gastroesophageal reflux disease without esophagitis 06/12/2015   Gout of left foot    History of lymphoma 05/17/2021   History of squamous cell carcinoma in situ 04/18/2021   Hyperlipidemia 04/11/2016   Hypertensive heart disease with heart failure (HCC) 02/15/2015   Hypertensive urgency 05/17/2021   Idiopathic chronic gout 05/17/2015   Insomnia 11/27/2015   Iron deficiency anemia 04/27/2021   Leukocytosis 04/11/2016   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   Male erectile disorder 06/17/2016   Meibomian gland dysfunction (MGD) of both eyes 10/02/2018  Normocytic anemia 05/17/2021   Obstructive sleep apnea 08/30/2015   Overview:  CPAP   Old MI (myocardial infarction)    Orthostatic dizziness 09/15/2017   2018: med related   Peripheral venous insufficiency 11/08/2020   Peritoneal adhesion 11/08/2020   Pseudophakia of both eyes 03/12/2016   Pulmonary hypertension (HCC) 08/30/2015   Overview:  Managed CARDS   Restless leg syndrome 08/30/2015   SBO (small bowel obstruction) (HCC)  12/14/2019   Sensorineural hearing loss (SNHL), bilateral 10/04/2019   Small bowel obstruction (HCC) 04/11/2016   Stage 3a chronic kidney disease (HCC) 06/13/2019   Statin intolerance 03/09/2021   Type 2 diabetes mellitus with neurologic complication, without long-term current use of insulin (HCC) 12/02/2014   Type 2 diabetes mellitus with stage 3a chronic kidney disease, without long-term current use of insulin (HCC) 02/22/2020   Ulcer of left foot with fat layer exposed (HCC)    UTI (urinary tract infection) 05/17/2021   Past Surgical History:  Procedure Laterality Date   ABDOMINAL ADHESION SURGERY     x 2   BLEPHAROPLASTY Bilateral    CARPAL TUNNEL RELEASE Right 2021   CATARACT EXTRACTION Bilateral    CERVICAL SPINE SURGERY     CHOLECYSTECTOMY     CORONARY ANGIOPLASTY WITH STENT PLACEMENT  2000   IRRIGATION AND DEBRIDEMENT FOOT Left 08/11/2021   Procedure: IRRIGATION AND DEBRIDEMENT FOOT, AND BONE BIOPSY, PARTIAL SESMOIDECTOMY;  Surgeon: Edwin Cap, DPM;  Location: [facility];  Service: Podiatry;  Laterality: Left;   NASAL SINUS SURGERY     ORIF TIBIAL SHAFT FRACTURE W/ PLATES AND SCREWS Left    PARTIAL KNEE ARTHROPLASTY Bilateral    SMALL INTESTINE SURGERY     Adenocarcinoma   TONSILLECTOMY     Family History  Problem Relation Age of Onset   Cancer Mother        Ovarian   Diabetes Mother    Heart disease Mother    Heart disease Father    Stroke Father    Diabetes Father    Diabetes Sister    Diabetes Brother    Diabetes Brother    Cancer Paternal Grandfather        Prostate   Colon cancer Neg Hx    Rectal cancer Neg Hx    Pancreatic cancer Neg Hx    Liver cancer Neg Hx    Stomach cancer Neg Hx    Social History:  reports that he has never smoked. He has never used smokeless tobacco. He reports that he does not currently use alcohol. He reports that he does not use drugs. Allergies:  Allergies  Allergen Reactions   Ace Inhibitors Rash   Amoxicillin Nausea Only    Atorvastatin Other (See Comments)    Dizziness.   Jardiance [Empagliflozin] Other (See Comments)    Orthostasis   Meperidine Rash   Naproxen Sodium Rash   Sulfa Antibiotics Nausea Only   Tetracycline Nausea Only   Medications Prior to Admission  Medication Sig Dispense Refill   albuterol (VENTOLIN HFA) 108 (90 Base) MCG/ACT inhaler Inhale 2 puffs into the lungs every 6 (six) hours as needed for wheezing or shortness of breath. 8 g 2   allopurinol (ZYLOPRIM) 100 MG tablet TAKE 1 TABLET BY MOUTH EVERY DAY 90 tablet 3   Alpha-Lipoic Acid 600 MG TABS Take 600 mg by mouth in the morning and at bedtime.     b complex vitamins capsule Take 1 capsule by mouth daily.     carvedilol (COREG) 3.125 MG  tablet Take 1 tablet (3.125 mg total) by mouth 2 (two) times daily with a meal. 180 tablet 3   Cyanocobalamin (B-12) 1000 MCG SUBL Place 1,000 mg under the tongue.     dabigatran (PRADAXA) 150 MG CAPS capsule Take 1 capsule (150 mg total) by mouth 2 (two) times daily. 180 capsule 1   FEROSUL 325 (65 Fe) MG tablet TAKE 1 TABLET BY MOUTH 2 TIMES A DAY (Patient taking differently: Take 325 mg by mouth daily.) 60 tablet 2   furosemide (LASIX) 20 MG tablet Take 2 tablets (40 mg total) by mouth daily with breakfast AND 1 tablet (20 mg total) daily before lunch. 270 tablet 3   hydrocortisone 2.5 % ointment Apply 1 application topically 2 (two) times daily as needed (Skin Irritation).     lactobacillus acidophilus (BACID) TABS tablet Take 1 tablet by mouth 3 (three) times daily.     loperamide (IMODIUM A-D) 2 MG tablet Take 2 mg by mouth 4 (four) times daily as needed for diarrhea or loose stools.     Melatonin 5 MG CAPS Take 5 mg by mouth at bedtime.     metFORMIN (GLUCOPHAGE-XR) 500 MG 24 hr tablet TAKE 2 TABLETS BY MOUTH EVERY DAY WITH DINNER (DIABETES) (Patient taking differently: Take 500 mg by mouth every evening.) 180 tablet 1   nystatin cream (MYCOSTATIN) Apply 1 Application topically 2 (two) times daily.  (Patient taking differently: Apply 1 Application topically 2 (two) times daily as needed for dry skin.) 30 g 1   OVER THE COUNTER MEDICATION Take 25 mg by mouth daily. Medication: Balance of Nature: Fruits and Vegs supplements     polyvinyl alcohol (LIQUIFILM TEARS) 1.4 % ophthalmic solution Place 1 drop into both eyes as needed for dry eyes.     psyllium (METAMUCIL) 58.6 % packet Take 1 packet by mouth at bedtime. With a full glass of water     REPATHA SURECLICK 140 MG/ML SOAJ INJECT 140 MG INTO THE SKIN EVERY 14 DAYS (Patient taking differently: Inject 140 mg into the skin every 14 (fourteen) days.) 2 mL 6   SitaGLIPtin-MetFORMIN HCl (JANUMET XR) 6070143806 MG TB24 TAKE 1 TABLET BY MOUTH EVERY DAY WITH BREAKFAST (FOR DIABETES) (Patient taking differently: Take 1 tablet by mouth daily.) 90 tablet 0   glucose blood test strip OneTouch Ultra Blue Test Strip     Lancets (ONETOUCH DELICA PLUS LANCET33G) MISC OneTouch Delica Plus Lancet 33 gauge     Spacer/Aero-Holding Chambers DEVI Use with albuterol inhaler 1 each 0   traMADol-acetaminophen (ULTRACET) 37.5-325 MG tablet Take by mouth. (Patient not taking: Reported on 10/27/2022)      Home: Home Living Family/patient expects to be discharged to:: Private residence Living Arrangements: Spouse/significant other Available Help at Discharge: Family, Available 24 hours/day Type of Home: House Home Access: Level entry Home Layout: Two level, Able to live on main level with bedroom/bathroom Bathroom Shower/Tub: Health visitor: Handicapped height Home Equipment: Rollator (4 wheels), Cane - single point  Functional History: Prior Function Prior Level of Function : Independent/Modified Independent, History of Falls (last six months) Mobility Comments: Amb with rollator Functional Status:  Mobility: Bed Mobility Overal bed mobility: Needs Assistance Bed Mobility: Supine to Sit, Sit to Supine Supine to sit: Mod assist Sit to supine: +2  for physical assistance, Max assist General bed mobility comments: Assist to bring legs off of bed, elevate trunk into sitting and bring hips to EOB. Assist for all aspects returning to supine due to pain  Transfers Overall transfer level: Needs assistance Equipment used: 2 person hand held assist Transfers: Sit to/from Stand Sit to Stand: +2 physical assistance, Mod assist General transfer comment: Assist to power up and for balance. Ambulation/Gait Pre-gait activities: Shuffled feet slightly up toward head of bed in standing with +2 mod assist of hand held    ADL:    Cognition: Cognition Overall Cognitive Status: Impaired/Different from baseline Orientation Level: (P) Oriented X4 Cognition Arousal/Alertness: Awake/alert Behavior During Therapy: WFL for tasks assessed/performed Overall Cognitive Status: Impaired/Different from baseline Area of Impairment: Memory, Attention, Following commands, Safety/judgement, Awareness, Problem solving Current Attention Level: Sustained Memory: Decreased short-term memory Following Commands: Follows one step commands consistently, Follows one step commands with increased time Safety/Judgement: Decreased awareness of safety, Decreased awareness of deficits Awareness: Intellectual Problem Solving: Slow processing, Decreased initiation, Requires verbal cues, Requires tactile cues, Difficulty sequencing  Blood pressure (!) 122/40, pulse 74, temperature 97.8 F (36.6 C), temperature source Oral, resp. rate 16, height 5\' 11"  (1.803 m), weight 119.7 kg, SpO2 97%. Physical Exam   General: No apparent distress, obese HEENT: Head is normocephalic, atraumatic, MMM Neck: Supple without JVD or lymphadenopathy Heart: Irregular rhythm Chest: CTA bilaterally, breathing effort appears to be mildly elevated however family reports this is his baseline, chest wall soreness to palpation Abdomen: Soft, mildly-tender, moderately-distended, bowel sounds  positive. Extremities: trace lower extremity edema Psych: Affect is flat Skin: Multiple bruises in bilateral upper extremities, several border foam dressings over skin tears on his arms Neuro: Alert and oriented self only, reports he had a [address]  Delayed responses, slow processing.  Perseveration.  Follows simple commands but requires some cues.  Cranial nerves II through XII grossly intact.  Impaired memory. Strength 5 out of 5 in bilateral upper extremities, 4-5 in bilateral lower extremities Altered sensation to light touch distal feet and ankles, appears to be stocking glove distribution Musculoskeletal:  Chest wall soreness noted No abnormal tone noted   Results for orders placed or performed during the hospital encounter of 10/28/2022 (from the past 24 hour(s))  Glucose, capillary     Status: Abnormal   Collection Time: 11/07/22  1:36 PM  Result Value Ref Range   Glucose-Capillary 142 (H) 70 - 99 mg/dL  Glucose, capillary     Status: Abnormal   Collection Time: 11/07/22  3:18 PM  Result Value Ref Range   Glucose-Capillary 115 (H) 70 - 99 mg/dL  Glucose, capillary     Status: Abnormal   Collection Time: 11/07/22  7:51 PM  Result Value Ref Range   Glucose-Capillary 107 (H) 70 - 99 mg/dL  Glucose, capillary     Status: Abnormal   Collection Time: 11/07/22 11:51 PM  Result Value Ref Range   Glucose-Capillary 121 (H) 70 - 99 mg/dL  Glucose, capillary     Status: Abnormal   Collection Time: 11/08/22  4:40 AM  Result Value Ref Range   Glucose-Capillary 117 (H) 70 - 99 mg/dL  Blood gas, arterial     Status: None   Collection Time: 11/08/22  5:08 AM  Result Value Ref Range   pH, Arterial 7.42 7.35 - 7.45   pCO2 arterial 34 32 - 48 mmHg   pO2, Arterial 102 83 - 108 mmHg   Bicarbonate 22.7 20.0 - 28.0 mmol/L   Acid-base deficit 1.3 0.0 - 2.0 mmol/L   O2 Saturation 98.9 %   Patient temperature 36.7    Collection site LEFT RADIAL    Allens test (pass/fail) PASS PASS  Glucose, capillary     Status: Abnormal   Collection Time: 11/08/22  8:08 AM  Result Value Ref Range   Glucose-Capillary 118 (H) 70 - 99 mg/dL  Glucose, capillary     Status: Abnormal   Collection Time: 11/08/22 10:55 AM  Result Value Ref Range   Glucose-Capillary 115 (H) 70 - 99 mg/dL   ECHOCARDIOGRAM COMPLETE  Result Date: 11/07/2022    ECHOCARDIOGRAM REPORT   Patient Name:   HOLTON SIDMAN Date of Exam: 11/07/2022 Medical Rec #:  [id number]          Height:       71.0 in Accession #:    [id number]         Weight:       256.8 lb Date of Birth:  1935-09-22         BSA:          2.345 m Patient Age:    86 years           BP:           114/52 mmHg Patient Gender: M                  HR:           60 bpm. Exam Location:  Inpatient Procedure: 2D Echo, Color Doppler, Cardiac Doppler and Intracardiac            Opacification Agent Indications:    Cardiac Arrest  History:        Patient has prior history of Echocardiogram examinations, most                 recent 08/09/2021. CHF; Risk Factors:Hypertension and                 Dyslipidemia.  Sonographer:    Harriette Bouillon RDCS Referring Phys: [phone] LAURA R GLEASON IMPRESSIONS  1. Left ventricular ejection fraction, by estimation, is 45 to 50%. The left ventricle has mildly decreased function. Left ventricular endocardial border not optimally defined to evaluate regional wall motion. Left ventricular diastolic function could not be evaluated.  2. Right ventricular systolic function was not well visualized. The right ventricular size is not well visualized.  3. The mitral valve was not well visualized. No evidence of mitral valve regurgitation.  4. Tricuspid valve regurgitation Not well visualized.  5. The aortic valve was not well visualized. There is mild calcification of the aortic valve. There is mild thickening of the aortic valve. Aortic valve regurgitation is trivial. Aortic valve sclerosis is present, with no evidence of aortic valve stenosis.  Comparison(s): No significant change from prior study. Conclusion(s)/Recommendation(s): Technically limited study, even with use of echo contrast. LVEF low normal without ability to evaluate for focal wall motion. FINDINGS  Left Ventricle: Left ventricular ejection fraction, by estimation, is 45 to 50%. The left ventricle has mildly decreased function. Left ventricular endocardial border not optimally defined to evaluate regional wall motion. Definity contrast agent was given IV to delineate the left ventricular endocardial borders. The left ventricular internal cavity size was normal in size. Suboptimal image quality limits for assessment of left ventricular hypertrophy. Left ventricular diastolic function could not be  evaluated. Right Ventricle: The right ventricular size is not well visualized. Right vetricular wall thickness was not well visualized. Right ventricular systolic function was not well visualized. Left Atrium: Left atrial size was not well visualized. Right Atrium: Right atrial size was not well visualized. Pericardium: There is no evidence of pericardial  effusion. Mitral Valve: The mitral valve was not well visualized. There is mild thickening of the mitral valve leaflet(s). There is mild calcification of the mitral valve leaflet(s). Mild to moderate mitral annular calcification. No evidence of mitral valve regurgitation. Tricuspid Valve: The tricuspid valve is not well visualized. Tricuspid valve regurgitation Not well visualized. Aortic Valve: The aortic valve was not well visualized. There is mild calcification of the aortic valve. There is mild thickening of the aortic valve. Aortic valve regurgitation is trivial. Aortic valve sclerosis is present, with no evidence of aortic valve stenosis. Pulmonic Valve: The pulmonic valve was not well visualized. Pulmonic valve regurgitation is not visualized. Aorta: The aortic root was not well visualized, the ascending aorta was not well visualized and the  aortic arch was not well visualized. IAS/Shunts: The interatrial septum was not well visualized.  LEFT VENTRICLE PLAX 2D LVIDd:         5.80 cm LVIDs:         2.90 cm LV IVS:        1.10 cm LVOT diam:     2.40 cm LV SV:         47 LV SV Index:   20 LVOT Area:     4.52 cm  LEFT ATRIUM           Index        RIGHT ATRIUM           Index LA diam:      4.60 cm 1.96 cm/m   RA Area:     24.00 cm LA Vol (A4C): 64.2 ml 27.38 ml/m  RA Volume:   84.90 ml  36.20 ml/m  AORTIC VALVE LVOT Vmax:   54.00 cm/s LVOT Vmean:  40.800 cm/s LVOT VTI:    0.104 m  AORTA Ao Root diam: 3.00 cm Ao Asc diam:  3.30 cm MITRAL VALVE MV Area (PHT): 4.86 cm     SHUNTS MV Decel Time: 156 msec     Systemic VTI:  0.10 m MV E velocity: 104.00 cm/s  Systemic Diam: 2.40 cm MV A velocity: 43.50 cm/s MV E/A ratio:  2.39 Jodelle Red MD Electronically signed by Jodelle Red MD Signature Date/Time: 11/07/2022/1:15:44 PM    Final    VAS [place] LOWER EXTREMITY VENOUS (DVT)  Result Date: 10/29/2022  Lower Venous DVT Study Patient Name:  STEN DEMATTEO  Date of Exam:   10/18/2022 Medical Rec #: [id number]           Accession #:    [id number] Date of Birth: 1935/07/29          Patient Gender: M Patient Age:   60 years Exam Location:  [facility] Procedure:      VAS [place] LOWER EXTREMITY VENOUS (DVT) Referring Phys: Emilie Rutter --------------------------------------------------------------------------------  Indications: Edema.  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 Anticoagulation: Heparin. Comparison Study: No prior study Performing Technologist: Shona Simpson  Examination Guidelines: A complete evaluation includes B-mode imaging, spectral Doppler, color Doppler, and power Doppler as needed of all accessible portions of each vessel. Bilateral testing is considered an integral part of a complete examination. Limited examinations for reoccurring indications may be performed as noted. The reflux portion of the exam is performed with  the patient in reverse Trendelenburg.  +---------+---------------+---------+-----------+----------+--------------+ RIGHT    CompressibilityPhasicitySpontaneityPropertiesThrombus Aging +---------+---------------+---------+-----------+----------+--------------+ CFV      Full           Yes      Yes                                 +---------+---------------+---------+-----------+----------+--------------+  SFJ      Full                                                        +---------+---------------+---------+-----------+----------+--------------+ FV Prox  Full                                                        +---------+---------------+---------+-----------+----------+--------------+ FV Mid   Full                                                        +---------+---------------+---------+-----------+----------+--------------+ FV DistalFull                                                        +---------+---------------+---------+-----------+----------+--------------+ PFV      Full                                                        +---------+---------------+---------+-----------+----------+--------------+ POP      Full           Yes      Yes                                 +---------+---------------+---------+-----------+----------+--------------+ PTV      Full                                                        +---------+---------------+---------+-----------+----------+--------------+ PERO     Full                                                        +---------+---------------+---------+-----------+----------+--------------+   +---------+---------------+---------+-----------+----------+--------------+ LEFT     CompressibilityPhasicitySpontaneityPropertiesThrombus Aging +---------+---------------+---------+-----------+----------+--------------+ CFV      Full           Yes      Yes                                  +---------+---------------+---------+-----------+----------+--------------+ SFJ      Full                                                        +---------+---------------+---------+-----------+----------+--------------+  FV Prox  Full                                                        +---------+---------------+---------+-----------+----------+--------------+ FV Mid   Full                                                        +---------+---------------+---------+-----------+----------+--------------+ FV DistalFull                                                        +---------+---------------+---------+-----------+----------+--------------+ PFV      Full                                                        +---------+---------------+---------+-----------+----------+--------------+ POP      Full           Yes      Yes                                 +---------+---------------+---------+-----------+----------+--------------+ PTV      Full                                                        +---------+---------------+---------+-----------+----------+--------------+ PERO     Full                                                        +---------+---------------+---------+-----------+----------+--------------+     Summary: BILATERAL: - No evidence of deep vein thrombosis seen in the lower extremities, bilaterally. - RIGHT: - A cystic structure is found in the popliteal fossa.  LEFT: - No cystic structure found in the popliteal fossa.  *See table(s) above for measurements and observations. Electronically signed by Heath Lark on 10/27/2022 at 5:32:04 PM.    Final     Assessment/Plan: Diagnosis: PEA arrest Does the need for close, 24 hr/day medical supervision in concert with the patient's rehab needs make it unreasonable for this patient to be served in a less intensive setting? Yes Co-Morbidities requiring supervision/potential complications:   -Acute on chronic heart failure HFmrEF, AKI, permanent A-fib, diabetes mellitus, OSA, CAD Due to bladder management, bowel management, safety, skin/wound care, disease management, medication administration, pain management, and patient education, does the patient require 24 hr/day rehab nursing? Yes Does the patient require coordinated care of a physician, rehab nurse, therapy disciplines of physical therapy, Occupational Therapy, speech therapy to address physical and functional deficits in  the context of the above medical diagnosis(es)? Yes Addressing deficits in the following areas: balance, endurance, locomotion, strength, transferring, bowel/bladder control, bathing, dressing, feeding, grooming, toileting, cognition, speech, language, and psychosocial support Can the patient actively participate in an intensive therapy program of at least 3 hrs of therapy per day at least 5 days per week? Yes The potential for patient to make measurable gains while on inpatient rehab is excellent Anticipated functional outcomes upon discharge from inpatient rehab are supervision and min assist  with PT, supervision and min assist with OT, supervision and min assist with SLP. Estimated rehab length of stay to reach the above functional goals is: 10-12 Anticipated discharge destination:  home Overall Rehab/Functional Prognosis: good  POST ACUTE RECOMMENDATIONS: This patient's condition is appropriate for continued rehabilitative care in the following setting: CIR Patient has agreed to participate in recommended program. Yes Note that insurance prior authorization may be required for reimbursement for recommended care.  Comment: I think patient would be a good candidate for CIR medically stable   MEDICAL RECOMMENDATIONS: Consider addition of laxative such as Senokot or dose of sorbitol as he has not had recent bowel movement. Consider SLP for cognition evaluation   I have personally performed a face to  face diagnostic evaluation of this patient. Additionally, I have examined the patient's medical record including any pertinent labs and radiographic images. If the physician assistant has documented in this note, I have reviewed and edited or otherwise concur with the physician assistant's documentation.  Thanks,  Fanny Dance, MD 11/08/2022

## 2022-11-08 NOTE — Progress Notes (Signed)
eLink Physician-Brief Progress Note Patient Name: Shawn Blanchard DOB: 04-07-1936 MRN: [id number]   Date of Service  11/08/2022  HPI/Events of Note  RN reports some intermittent confusion. No resp distress at this time. is on CPAP. Calm at this time.   eICU Interventions  Check ABG  I discontinued the Q2 PRN fentanyl order, has oral oxycodone ordered if needed. RN said she has not needed the IV fentanyl anyway. Would try and avoid sedating meds      Intervention Category Major Interventions: Respiratory failure - evaluation and management   G  11/08/2022, 4:25 AM

## 2022-11-08 NOTE — Plan of Care (Signed)
  Problem: Clinical Measurements: Goal: Respiratory complications will improve Outcome: Progressing   Problem: Education: Goal: Knowledge of General Education information will improve Description Including pain rating scale, medication(s)/side effects and non-pharmacologic comfort measures Outcome: Not Progressing   Problem: Health Behavior/Discharge Planning: Goal: Ability to manage health-related needs will improve Outcome: Not Progressing   

## 2022-11-08 NOTE — Evaluation (Signed)
Occupational Therapy Evaluation Patient Details Name: Shawn Blanchard MRN: [id number] DOB: May 05, 1935 Today's Date: 11/08/2022   History of Present Illness Pt is 87 year old presented on  10/07/2022 after cardiac arrest during outpt CT at [facility]. Intubated and transferred to [facility]. Suspect airway obstruction, diastolic heart failure combination contributing to cardiopulmonary arrest. Extubated 7/31. 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   Clinical Impression   Pt presents with decline in function and safety with ADLs and ADL mobility with impaired strength, balance, endurance and cognition. PTA pt lived at home with his wife, was Ind with ADLs/selfcare and used a Product manager for mobility. Pt currently requires mod A to sit EOB, mod A +2 for mobility, sit - stand and SPTs, max A with UB ADLs and total A with LB ADLs and toileting tasks. Pt would benefit from acute OT services to address impairments to maximize level of function and safety      Recommendations for follow up therapy are one component of a multi-disciplinary discharge planning process, led by the attending physician.  Recommendations may be updated based on patient status, additional functional criteria and insurance authorization.   Assistance Recommended at Discharge Frequent or constant Supervision/Assistance  Patient can return home with the following Two people to help with walking and/or transfers;A lot of help with walking and/or transfers;Direct supervision/assist for medications management;Assist for transportation    Functional Status Assessment  Patient has had a recent decline in their functional status and demonstrates the ability to make significant improvements in function in a reasonable and predictable amount of time.  Equipment Recommendations  Other (comment) (TBD)    Recommendations for Other Services       Precautions /  Restrictions Precautions Precautions: Fall Restrictions Weight Bearing Restrictions: No      Mobility Bed Mobility Overal bed mobility: Needs Assistance Bed Mobility: Supine to Sit     Supine to sit: Mod assist     General bed mobility comments: Assist to bring legs off of bed, elevate trunk into sitting and bring hips to EOB    Transfers Overall transfer level: Needs assistance Equipment used: 2 person hand held assist Transfers: Sit to/from Stand, Bed to chair/wheelchair/BSC Sit to Stand: +2 physical assistance, Mod assist Stand pivot transfers: Mod assist, +2 physical assistance         General transfer comment: Assist to power up and for balance. Multiomodal cues for sequencing, posture and LE movement SPT to chair      Balance Overall balance assessment: Needs assistance Sitting-balance support: Bilateral upper extremity supported, Feet supported Sitting balance-Leahy Scale: Poor Sitting balance - Comments: UE support and min guard for static standing Postural control: Right lateral lean Standing balance support: Bilateral upper extremity supported, During functional activity Standing balance-Leahy Scale: Poor                             ADL either performed or assessed with clinical judgement   ADL Overall ADL's : Needs assistance/impaired Eating/Feeding: Supervision/ safety;Sitting   Grooming: Wash/dry hands;Wash/dry face;Minimal assistance;Sitting   Upper Body Bathing: Maximal assistance   Lower Body Bathing: Total assistance   Upper Body Dressing : Maximal assistance   Lower Body Dressing: Total assistance   Toilet Transfer: Moderate assistance;+2 for physical assistance;Cueing for safety;Cueing for sequencing Toilet Transfer Details (indicate cue type and reason): simulated SPT to chair Toileting- Clothing  Manipulation and Hygiene: Total assistance       Functional mobility during ADLs: Moderate assistance;+2 for physical  assistance;Cueing for safety;Cueing for sequencing       Vision Baseline Vision/History: 1 Wears glasses Patient Visual Report: No change from baseline       Perception     Praxis      Pertinent Vitals/Pain Pain Assessment Pain Assessment: 0-10 Faces Pain Scale: Hurts little more Pain Location: chest, ribs Pain Descriptors / Indicators: Grimacing, Guarding Pain Intervention(s): Monitored during session, Repositioned     Hand Dominance Left   Extremity/Trunk Assessment Upper Extremity Assessment Upper Extremity Assessment: Generalized weakness;RUE deficits/detail;LUE deficits/detail RUE Deficits / Details: edematous LUE Deficits / Details: edematous   Lower Extremity Assessment Lower Extremity Assessment: Defer to PT evaluation       Communication Communication Communication: No difficulties   Cognition Arousal/Alertness: Awake/alert Behavior During Therapy: WFL for tasks assessed/performed Overall Cognitive Status: Impaired/Different from baseline Area of Impairment: Memory, Attention, Following commands, Safety/judgement, Awareness, Problem solving                     Memory: Decreased short-term memory Following Commands: Follows one step commands consistently, Follows one step commands with increased time Safety/Judgement: Decreased awareness of safety, Decreased awareness of deficits   Problem Solving: Slow processing, Decreased initiation, Requires verbal cues, Requires tactile cues, Difficulty sequencing       General Comments       Exercises     Shoulder Instructions      Home Living Family/patient expects to be discharged to:: Private residence Living Arrangements: Spouse/significant other Available Help at Discharge: Family;Available 24 hours/day Type of Home: House       Home Layout: Two level;Able to live on main level with bedroom/bathroom     Bathroom Shower/Tub: Producer, television/film/video: Handicapped height      Home Equipment: Rollator (4 wheels);Cane - single point;Shower seat;Grab bars - tub/shower          Prior Functioning/Environment Prior Level of Function : Independent/Modified Independent;History of Falls (last six months)             Mobility Comments: rollater ADLs Comments: Ind with ADLs/selfcare, toileting        OT Problem List: Decreased strength;Impaired balance (sitting and/or standing);Decreased cognition;Pain;Decreased safety awareness;Decreased activity tolerance;Decreased coordination;Increased edema;Obesity;Decreased knowledge of use of DME or AE      OT Treatment/Interventions: Self-care/ADL training;DME and/or AE instruction;Therapeutic activities;Balance training;Therapeutic exercise;Patient/family education;Energy conservation    OT Goals(Current goals can be found in the care plan section) Acute Rehab OT Goals Patient Stated Goal: "get better" OT Goal Formulation: With patient/family Time For Goal Achievement: 11/22/22 Potential to Achieve Goals: Good ADL Goals Pt Will Perform Grooming: with min guard assist;with supervision;sitting Pt Will Perform Upper Body Bathing: with mod assist;sitting Pt Will Perform Upper Body Dressing: with mod assist;sitting Pt Will Transfer to Toilet: with mod assist;stand pivot transfer;bedside commode Pt Will Perform Toileting - Clothing Manipulation and hygiene: with max assist;with mod assist;sitting/lateral leans;sit to/from stand  OT Frequency: Min 2X/week    Co-evaluation              AM-PAC OT "6 Clicks" Daily Activity     Outcome Measure Help from another person eating meals?: A Little Help from another person taking care of personal grooming?: A Little Help from another person toileting, which includes using toliet, bedpan, or urinal?: Total Help from another person bathing (including washing, rinsing, drying)?: A Lot Help from  another person to put on and taking off regular upper body clothing?: A Lot Help  from another person to put on and taking off regular lower body clothing?: Total 6 Click Score: 12   End of Session Equipment Utilized During Treatment: Gait belt  Activity Tolerance: Patient limited by fatigue Patient left: in chair;with call bell/phone within reach;with bed alarm set;with family/visitor present  OT Visit Diagnosis: Unsteadiness on feet (R26.81);Other abnormalities of gait and mobility (R26.89);Muscle weakness (generalized) (M62.81);Other symptoms and signs involving cognitive function                Time: 1610-9604 OT Time Calculation (min): 31 min Charges:  OT General Charges $OT Visit: 1 Visit OT Evaluation $OT Eval Moderate Complexity: 1 Mod OT Treatments $Therapeutic Activity: 8-22 mins   Galen Manila 11/08/2022, 12:22 PM

## 2022-11-08 NOTE — Progress Notes (Signed)
Physical Therapy Treatment Patient Details Name: Shawn Blanchard MRN: [id number] DOB: [birth year] Today's Date: 11/08/2022   History of Present Illness Pt is 87 year old presented on  10/10/2022 after cardiac arrest during outpt CT at [facility]. Intubated and transferred to [facility]. Suspect airway obstruction, diastolic heart failure combination contributing to cardiopulmonary arrest. Extubated 7/31. 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    PT Comments  Pt received sitting in the recliner and nursing requesting assist for him to return to bed. Pt requires max A +2 to stand to the stedy due to weakness and pain. Pt continues to require heavy max A +2 to stand from the stedy paddles to move them out of the way. Pt requires total A +2 to return to supine and then stops responding to stimulation and requires increase in O2 to respond, RN present throughout. Pt continues to benefit from PT services to progress toward functional mobility goals.    If plan is discharge home, recommend the following: A lot of help with walking and/or transfers;A lot of help with bathing/dressing/bathroom;Assistance with cooking/housework;Direct supervision/assist for medications management;Direct supervision/assist for financial management;Assist for transportation;Help with stairs or ramp for entrance   Can travel by private vehicle        Equipment Recommendations  Other (comment) (TBD)    Recommendations for Other Services       Precautions / Restrictions Precautions Precautions: Fall Restrictions Weight Bearing Restrictions: No     Mobility  Bed Mobility Overal bed mobility: Needs Assistance Bed Mobility: Sit to Supine       Sit to supine: Total assist, +2 for physical assistance   General bed mobility comments: Assist for all aspects    Transfers Overall transfer level: Needs assistance Equipment used:  Ambulation equipment used (stedy) Transfers: Sit to/from Stand, Bed to chair/wheelchair/BSC Sit to Stand: Max assist, +2 physical assistance           General transfer comment: Pt able to stand to stedy from recliner with max A +2 for power up. Pt demonstrates increased difficulty standing from stedy paddles due to increased fatigue and pain. Transfer via Lift Equipment: Stedy      Balance Overall balance assessment: Needs assistance Sitting-balance support: Bilateral upper extremity supported, Feet supported Sitting balance-Leahy Scale: Poor Sitting balance - Comments: sitting in recliner and EOB   Standing balance support: Bilateral upper extremity supported, During functional activity, Reliant on assistive device for balance Standing balance-Leahy Scale: Zero Standing balance comment: Pt required max A to remain standing and place stedy paddles                            Cognition Arousal/Alertness: Awake/alert Behavior During Therapy: WFL for tasks assessed/performed Overall Cognitive Status: Difficult to assess                                          Exercises      General Comments General comments (skin integrity, edema, etc.): Once returned to supine pt noted to not respond to stimulation and requires increase in O2 for improvement. RN present. Family reporting pt's BP drops in standing.      Pertinent Vitals/Pain Pain Assessment Pain Assessment: Faces Faces Pain Scale: Hurts even more Pain Location: chest, ribs Pain Descriptors /  Indicators: Grimacing, Guarding, Moaning Pain Intervention(s): Limited activity within patient's tolerance, Monitored during session, Repositioned    Home Living Family/patient expects to be discharged to:: Private residence Living Arrangements: Spouse/significant other Available Help at Discharge:  (dtr can asisst except on Monday and Tuesdays but will arrange 24/7) Type of Home: House         Home  Layout: Two level;Able to live on main level with bedroom/bathroom Home Equipment: Rollator (4 wheels);Cane - single point;Shower seat;Grab bars - tub/shower      Prior Function            PT Goals (current goals can now be found in the care plan section) Acute Rehab PT Goals Patient Stated Goal: not stated PT Goal Formulation: With patient Time For Goal Achievement: 11/21/22 Potential to Achieve Goals: Good Progress towards PT goals: Progressing toward goals    Frequency    Min 1X/week      PT Plan Current plan remains appropriate       AM-PAC PT "6 Clicks" Mobility   Outcome Measure  Help needed turning from your back to your side while in a flat bed without using bedrails?: A Lot Help needed moving from lying on your back to sitting on the side of a flat bed without using bedrails?: A Lot Help needed moving to and from a bed to a chair (including a wheelchair)?: Total Help needed standing up from a chair using your arms (e.g., wheelchair or bedside chair)?: Total Help needed to walk in hospital room?: Total Help needed climbing 3-5 steps with a railing? : Total 6 Click Score: 8    End of Session Equipment Utilized During Treatment: Gait belt Activity Tolerance: Patient limited by pain;Patient limited by fatigue Patient left: in bed;with call bell/phone within reach;with nursing/sitter in room;with family/visitor present Nurse Communication: Mobility status PT Visit Diagnosis: Unsteadiness on feet (R26.81);Other abnormalities of gait and mobility (R26.89);History of falling (Z91.81);Muscle weakness (generalized) (M62.81);Pain     Time: 1310-1326 PT Time Calculation (min) (ACUTE ONLY): 16 min  Charges:    $Therapeutic Activity: 8-22 mins PT General Charges $$ ACUTE PT VISIT: 1 Visit                     Johny Shock, PTA Acute Rehabilitation Services Secure Chat Preferred  [phone]    Johny Shock 11/08/2022, 2:14 PM

## 2022-11-08 NOTE — Progress Notes (Signed)
  Inpatient Rehabilitation Admissions Coordinator   I contacted patient's daughter, by phone.  We discussed goals and expectations of a possible CIR admit. They prefer CIR for rehab. Family can provide expected caregiver support that is recommended of supervision to min assist level. I will begin insurance Auth with [facility] for possible CIR admit pending approval.Please refer to Dr Sande Rives consult. Please call me with any questions.   Ottie Glazier, RN, MSN Rehab Admissions Coordinator [phone]

## 2022-11-08 NOTE — Progress Notes (Addendum)
Rounding Note    Patient Name: Shawn Blanchard Date of Encounter: 11/08/2022  [facility] Cardiologist: Shawn Herrlich, MD   Subjective   Sitting up in the chair, family at the bedside.  Inpatient Medications    Scheduled Meds:  acetaminophen  650 mg Oral Q6H   Chlorhexidine Gluconate Cloth  6 each Topical Daily   docusate sodium  100 mg Oral BID   famotidine  20 mg Oral BID   heparin  5,000 Units Subcutaneous Q8H   insulin aspart  0-15 Units Subcutaneous Q4H   lidocaine  1 patch Transdermal Q24H   mupirocin ointment   Nasal BID   polyethylene glycol  17 g Oral Daily   Continuous Infusions:  PRN Meds: mouth rinse, oxyCODONE   Vital Signs    Vitals:   11/07/22 2230 11/07/22 2350 11/08/22 0312 11/08/22 0716  BP:  (!) 122/49 (!) 143/52 (!) 136/59  Pulse: 65 (!) 57 71 67  Resp: 14 15 14 15   Temp:  98.3 F (36.8 C) 98 F (36.7 C)   TempSrc:  Oral Oral Oral  SpO2:  100% 100% 100%  Weight:   119.7 kg   Height:        Intake/Output Summary (Last 24 hours) at 11/08/2022 1019 Last data filed at 11/08/2022 1191 Gross per 24 hour  Intake 300 ml  Output 290 ml  Net 10 ml      11/08/2022    3:12 AM 11/07/2022    6:54 AM 10/31/2022    7:56 AM  Last 3 Weights  Weight (lbs) 263 lb 14.3 oz 256 lb 13.4 oz 251 lb 12.8 oz  Weight (kg) 119.7 kg 116.5 kg 114.216 kg      Telemetry    Atrial fibrillation, rates controlled - Personally Reviewed  Physical Exam   GEN: No acute distress.   Neck: No JVD Cardiac: Irreg Irreg, no murmurs, rubs, or gallops.  Respiratory: Clear to auscultation bilaterally. GI: Soft, nontender, non-distended  MS: No edema; No deformity. Neuro:  Nonfocal  Psych: Normal affect   Labs    High Sensitivity Troponin:   Recent Labs  Lab 10/28/2022 1111 10/28/2022 1343 10/23/2022 1607  TROPONINIHS 33* 148* 500*     Chemistry Recent Labs  Lab 10/07/2022 1111 10/07/2022 1129 11/05/2022 1402 11/05/2022 1904 11/07/22 0512  NA 137 143  143 138   --  136  K 3.8 3.4*  3.3* 4.1  --  4.3  CL 103 105  --   --  100  CO2 22  --   --   --  24  GLUCOSE 148* 131*  --   --  130*  BUN 33* 36*  --   --  35*  CREATININE 1.60* 1.40*  --  1.77* 2.10*  CALCIUM 8.7*  --   --   --  9.2  MG 2.1  --   --   --  2.0  PROT 6.8  --   --   --  6.0*  ALBUMIN 3.7  --   --   --  3.3*  AST 28  --   --   --  46*  ALT 13  --   --   --  22  ALKPHOS 79  --   --   --  65  BILITOT 0.9  --   --   --  1.7*  GFRNONAA 42*  --   --  37* 30*  ANIONGAP 12  --   --   --  12    Lipids No results for input(s): "CHOL", "TRIG", "HDL", "LABVLDL", "LDLCALC", "CHOLHDL" in the last 168 hours.  Hematology Recent Labs  Lab 11/05/2022 1111 10/16/2022 1129 10/19/2022 1402 10/10/2022 1904 11/07/22 0121  WBC 11.1*  --   --  13.8* 9.6  RBC 4.19*  --   --  4.00* 3.50*  HGB 12.2*   < > 12.2* 11.6* 10.5*  HCT 39.0   < > 36.0* 37.2* 32.9*  MCV 93.1  --   --  93.0 94.0  MCH 29.1  --   --  29.0 30.0  MCHC 31.3  --   --  31.2 31.9  RDW 15.9*  --   --  15.7* 15.9*  PLT 178  --   --  237 196   < > = values in this interval not displayed.   Thyroid No results for input(s): "TSH", "FREET4" in the last 168 hours.  BNP Recent Labs  Lab 10/26/2022 1121  BNP 840.3*    DDimer No results for input(s): "DDIMER" in the last 168 hours.   Radiology    ECHOCARDIOGRAM COMPLETE  Result Date: 11/07/2022    ECHOCARDIOGRAM REPORT   Patient Name:   Shawn Blanchard Date of Exam: 11/07/2022 Medical Rec #:  [id number]          Height:       71.0 in Accession #:    [id number]         Weight:       256.8 lb Date of Birth:  [birth year]         BSA:          2.345 m Patient Age:    86 years           BP:           114/52 mmHg Patient Gender: M                  HR:           60 bpm. Exam Location:  Inpatient Procedure: 2D Echo, Color Doppler, Cardiac Doppler and Intracardiac            Opacification Agent Indications:    Cardiac Arrest  History:        Patient has prior history of Echocardiogram examinations,  most                 recent 08/09/2021. CHF; Risk Factors:Hypertension and                 Dyslipidemia.  Sonographer:    Harriette Bouillon RDCS Referring Phys: [phone] Shawn Blanchard IMPRESSIONS  1. Left ventricular ejection fraction, by estimation, is 45 to 50%. The left ventricle has mildly decreased function. Left ventricular endocardial border not optimally defined to evaluate regional wall motion. Left ventricular diastolic function could not be evaluated.  2. Right ventricular systolic function was not well visualized. The right ventricular size is not well visualized.  3. The mitral valve was not well visualized. No evidence of mitral valve regurgitation.  4. Tricuspid valve regurgitation Not well visualized.  5. The aortic valve was not well visualized. There is mild calcification of the aortic valve. There is mild thickening of the aortic valve. Aortic valve regurgitation is trivial. Aortic valve sclerosis is present, with no evidence of aortic valve stenosis. Comparison(s): No significant change from prior study. Conclusion(s)/Recommendation(s): Technically limited study, even with use of echo contrast. LVEF low normal without ability to evaluate for focal wall  motion. FINDINGS  Left Ventricle: Left ventricular ejection fraction, by estimation, is 45 to 50%. The left ventricle has mildly decreased function. Left ventricular endocardial border not optimally defined to evaluate regional wall motion. Definity contrast agent was given IV to delineate the left ventricular endocardial borders. The left ventricular internal cavity size was normal in size. Suboptimal image quality limits for assessment of left ventricular hypertrophy. Left ventricular diastolic function could not be  evaluated. Right Ventricle: The right ventricular size is not well visualized. Right vetricular wall thickness was not well visualized. Right ventricular systolic function was not well visualized. Left Atrium: Left atrial size was not  well visualized. Right Atrium: Right atrial size was not well visualized. Pericardium: There is no evidence of pericardial effusion. Mitral Valve: The mitral valve was not well visualized. There is mild thickening of the mitral valve leaflet(s). There is mild calcification of the mitral valve leaflet(s). Mild to moderate mitral annular calcification. No evidence of mitral valve regurgitation. Tricuspid Valve: The tricuspid valve is not well visualized. Tricuspid valve regurgitation Not well visualized. Aortic Valve: The aortic valve was not well visualized. There is mild calcification of the aortic valve. There is mild thickening of the aortic valve. Aortic valve regurgitation is trivial. Aortic valve sclerosis is present, with no evidence of aortic valve stenosis. Pulmonic Valve: The pulmonic valve was not well visualized. Pulmonic valve regurgitation is not visualized. Aorta: The aortic root was not well visualized, the ascending aorta was not well visualized and the aortic arch was not well visualized. IAS/Shunts: The interatrial septum was not well visualized.  LEFT VENTRICLE PLAX 2D LVIDd:         5.80 cm LVIDs:         2.90 cm LV IVS:        1.10 cm LVOT diam:     2.40 cm LV SV:         47 LV SV Index:   20 LVOT Area:     4.52 cm  LEFT ATRIUM           Index        RIGHT ATRIUM           Index LA diam:      4.60 cm 1.96 cm/m   RA Area:     24.00 cm LA Vol (A4C): 64.2 ml 27.38 ml/m  RA Volume:   84.90 ml  36.20 ml/m  AORTIC VALVE LVOT Vmax:   54.00 cm/s LVOT Vmean:  40.800 cm/s LVOT VTI:    0.104 m  AORTA Ao Root diam: 3.00 cm Ao Asc diam:  3.30 cm MITRAL VALVE MV Area (PHT): 4.86 cm     SHUNTS MV Decel Time: 156 msec     Systemic VTI:  0.10 m MV E velocity: 104.00 cm/s  Systemic Diam: 2.40 cm MV A velocity: 43.50 cm/s MV E/A ratio:  2.39 Jodelle Red MD Electronically signed by Jodelle Red MD Signature Date/Time: 11/07/2022/1:15:44 PM    Final    VAS [place] LOWER EXTREMITY VENOUS  (DVT)  Result Date: 10/31/2022  Lower Venous DVT Study Patient Name:  JOE GEE  Date of Exam:   10/08/2022 Medical Rec #: [id number]           Accession #:    [id number] Date of Birth: July 16, 1935          Patient Gender: M Patient Age:   39 years Exam Location:  [facility] Procedure:      VAS [place] LOWER  EXTREMITY VENOUS (DVT) Referring Phys: Emilie Rutter --------------------------------------------------------------------------------  Indications: Edema.  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 Anticoagulation: Heparin. Comparison Study: No prior study Performing Technologist: Shona Simpson  Examination Guidelines: A complete evaluation includes B-mode imaging, spectral Doppler, color Doppler, and power Doppler as needed of all accessible portions of each vessel. Bilateral testing is considered an integral part of a complete examination. Limited examinations for reoccurring indications may be performed as noted. The reflux portion of the exam is performed with the patient in reverse Trendelenburg.  +---------+---------------+---------+-----------+----------+--------------+ RIGHT    CompressibilityPhasicitySpontaneityPropertiesThrombus Aging +---------+---------------+---------+-----------+----------+--------------+ CFV      Full           Yes      Yes                                 +---------+---------------+---------+-----------+----------+--------------+ SFJ      Full                                                        +---------+---------------+---------+-----------+----------+--------------+ FV Prox  Full                                                        +---------+---------------+---------+-----------+----------+--------------+ FV Mid   Full                                                        +---------+---------------+---------+-----------+----------+--------------+ FV DistalFull                                                         +---------+---------------+---------+-----------+----------+--------------+ PFV      Full                                                        +---------+---------------+---------+-----------+----------+--------------+ POP      Full           Yes      Yes                                 +---------+---------------+---------+-----------+----------+--------------+ PTV      Full                                                        +---------+---------------+---------+-----------+----------+--------------+ PERO     Full                                                        +---------+---------------+---------+-----------+----------+--------------+   +---------+---------------+---------+-----------+----------+--------------+  LEFT     CompressibilityPhasicitySpontaneityPropertiesThrombus Aging +---------+---------------+---------+-----------+----------+--------------+ CFV      Full           Yes      Yes                                 +---------+---------------+---------+-----------+----------+--------------+ SFJ      Full                                                        +---------+---------------+---------+-----------+----------+--------------+ FV Prox  Full                                                        +---------+---------------+---------+-----------+----------+--------------+ FV Mid   Full                                                        +---------+---------------+---------+-----------+----------+--------------+ FV DistalFull                                                        +---------+---------------+---------+-----------+----------+--------------+ PFV      Full                                                        +---------+---------------+---------+-----------+----------+--------------+ POP      Full           Yes      Yes                                  +---------+---------------+---------+-----------+----------+--------------+ PTV      Full                                                        +---------+---------------+---------+-----------+----------+--------------+ PERO     Full                                                        +---------+---------------+---------+-----------+----------+--------------+     Summary: BILATERAL: - No evidence of deep vein thrombosis seen in the lower extremities, bilaterally. - RIGHT: - A cystic structure is found in the popliteal fossa.  LEFT: - No cystic structure found in the popliteal fossa.  *See table(s) above for  measurements and observations. Electronically signed by Heath Lark on 10/08/2022 at 5:32:04 PM.    Final    DG Chest Portable 1 View  Result Date: 11/02/2022 CLINICAL DATA:  Cardiac arrest. EXAM: PORTABLE CHEST 1 VIEW COMPARISON:  Chest x-ray dated Aug 11, 2021. FINDINGS: Endotracheal tube tip in good position approximately 2.2 cm above the carina. Enteric tube tip in the stomach. Mild cardiomegaly. Layering bilateral pleural effusions, greater on the right. No pneumothorax. No acute osseous abnormality. IMPRESSION: 1. Appropriately positioned endotracheal and enteric tubes. 2. Layering bilateral pleural effusions, greater on the right. Electronically Signed   By: Obie Dredge M.D.   On: 10/27/2022 12:38   CT HEMATURIA WORKUP  Result Date: 10/28/2022 CLINICAL DATA:  Microscopic hematuria.  Status post cardiac arrest. EXAM: CT ABDOMEN AND PELVIS WITHOUT AND WITH CONTRAST TECHNIQUE: Multidetector CT imaging of the abdomen and pelvis was performed following the standard protocol before and following the bolus administration of intravenous contrast. RADIATION DOSE REDUCTION: This exam was performed according to the departmental dose-optimization program which includes automated exposure control, adjustment of the mA and/or kV according to patient size and/or use of iterative  reconstruction technique. CONTRAST:  OMNIPAQUE IOHEXOL 300 MG/ML  SOLN COMPARISON:  08/15/2021 FINDINGS: Lower chest: Moderate right pleural effusion and small left effusion identified. No airspace consolidation. Trace pericardial effusion. Aortic atherosclerosis and coronary artery calcifications. Hepatobiliary: There is no focal liver abnormality. Status post cholecystectomy. No bile duct dilatation. Pancreas: Unremarkable. No pancreatic ductal dilatation or surrounding inflammatory changes. Spleen: Normal in size without focal abnormality. Adrenals/Urinary Tract: Normal adrenal glands. There is no nephrolithiasis, hydronephrosis or suspicious kidney mass. No signs of hydroureter. Decompressed urinary bladder contains a small amount of excreted contrast material. No bladder calculi or enhancing bladder lesions identified. Stomach/Bowel: Stomach is nondistended. There is no pathologic dilatation of the large or small bowel loops to indicate a bowel obstruction. Postoperative changes involving the anterior small bowel loops identified with anastomotic suture chain noted, image 31/5. No bowel wall thickening identified. The appendix is not confidently identified. Sigmoid diverticulosis without signs of acute diverticulitis. Vascular/Lymphatic: Aortic atherosclerosis without aneurysm. The upper abdominal vascularity is patent. There is diffuse edema within the scratch edema with soft tissue stranding identified within the mesentery and retroperitoneal fat. Small mesenteric and retroperitoneal lymph nodes are identified without signs of adenopathy. Reproductive: Unremarkable.  No mass identified. Other: Small to moderate volume of abdominal ascites is noted which extends over the liver, spleen and stomach. Fluid is also noted extending along bilateral pericolic gutters. No discrete fluid collection to suggest an abscess. No signs of pneumoperitoneum. Fat containing right inguinal hernia. Diffuse body wall edema  is noted compatible with anasarca. Musculoskeletal: No acute or suspicious osseous findings. Mild thoracolumbar degenerative disc disease and lower lumbar facet arthropathy. IMPRESSION: 1. No nephrolithiasis, hydronephrosis or suspicious kidney mass. 2. Decompressed urinary bladder contains a small amount of excreted contrast material. No bladder calculi or enhancing bladder lesions identified. 3. Small to moderate volume of abdominal ascites. 4. Diffuse body wall edema compatible with anasarca. 5. Moderate right pleural effusion and small left effusion. 6. Aortic Atherosclerosis (ICD10-I70.0). Electronically Signed   By: Signa Kell M.D.   On: 10/24/2022 11:21    Cardiac Studies   Echo: 11/07/2022  IMPRESSIONS     1. Left ventricular ejection fraction, by estimation, is 45 to 50%. The  left ventricle has mildly decreased function. Left ventricular endocardial  border not optimally defined to evaluate regional wall motion. Left  ventricular  diastolic function could  not be evaluated.   2. Right ventricular systolic function was not well visualized. The right  ventricular size is not well visualized.   3. The mitral valve was not well visualized. No evidence of mitral valve  regurgitation.   4. Tricuspid valve regurgitation Not well visualized.   5. The aortic valve was not well visualized. There is mild calcification  of the aortic valve. There is mild thickening of the aortic valve. Aortic  valve regurgitation is trivial. Aortic valve sclerosis is present, with no  evidence of aortic valve  stenosis.   Comparison(s): No significant change from prior study.   Conclusion(s)/Recommendation(s): Technically limited study, even with use  of echo contrast. LVEF low normal without ability to evaluate for focal  wall motion.   FINDINGS   Left Ventricle: Left ventricular ejection fraction, by estimation, is 45  to 50%. The left ventricle has mildly decreased function. Left ventricular   endocardial border not optimally defined to evaluate regional wall motion.  Definity contrast agent was  given IV to delineate the left ventricular endocardial borders. The left  ventricular internal cavity size was normal in size. Suboptimal image  quality limits for assessment of left ventricular hypertrophy. Left  ventricular diastolic function could not be   evaluated.   Right Ventricle: The right ventricular size is not well visualized. Right  vetricular wall thickness was not well visualized. Right ventricular  systolic function was not well visualized.   Left Atrium: Left atrial size was not well visualized.   Right Atrium: Right atrial size was not well visualized.   Pericardium: There is no evidence of pericardial effusion.   Mitral Valve: The mitral valve was not well visualized. There is mild  thickening of the mitral valve leaflet(s). There is mild calcification of  the mitral valve leaflet(s). Mild to moderate mitral annular  calcification. No evidence of mitral valve  regurgitation.   Tricuspid Valve: The tricuspid valve is not well visualized. Tricuspid  valve regurgitation Not well visualized.   Aortic Valve: The aortic valve was not well visualized. There is mild  calcification of the aortic valve. There is mild thickening of the aortic  valve. Aortic valve regurgitation is trivial. Aortic valve sclerosis is  present, with no evidence of aortic  valve stenosis.   Pulmonic Valve: The pulmonic valve was not well visualized. Pulmonic valve  regurgitation is not visualized.   Aorta: The aortic root was not well visualized, the ascending aorta was  not well visualized and the aortic arch was not well visualized.   IAS/Shunts: The interatrial septum was not well visualized.   Patient Profile     87 y.o. male with cardiopulmonary arrest during prone position and CT scan, initial rhythm PEA   Assessment & Plan    PEA arrest -- suspected airway  obstruction/hypoxia during CT scan, along with decompensated HF. S/p extubation and recovering well -- per primary  Acute on Chronic HFmrEF -- baseline LVEF of 45-50%, echo 8/1 was essentially unchanged  -- clinically volume overloaded, CXR on 7/31 with bilateral pleural effusions -- Cr was 2.1 yesterday, repeat pending this morning -- restart lasix pending results  AKI -- baseline Cr 1.4, up to 2.1 yesterday in the setting of hypoperfusion -- follow BMET   Elevated Troponin -- mildly elevated, no evidence of ACS therefore suspect demand  Permanent atrial fibrillation WCT -- did have an episode of WCT after resuscitation with ACLS. Treated with IV amiodarone which has now been stopped with  no recurrent ectopy -- on Pradaxa as an outpatient, currently held in the setting of hematuria (though this seems to be improving, still some dark brown urine/clots noted in foley)   For questions or updates, please contact [facility] Please consult [email or web address] for contact info under        Signed, Laverda Page, NP  11/08/2022, 10:19 AM    ATTENDING ATTESTATION:  After conducting a review of all available clinical information with the care team, interviewing the patient, and performing a physical exam, I agree with the findings and plan described in this note.   GEN: No acute distress.   HEENT:  MMM, no JVD, no scleral icterus Cardiac: RRR, 2/6 SEM Respiratory: Clear to auscultation bilaterally. GI: Soft, nontender, non-distended  MS: No edema; No deformity. Neuro:  Nonfocal  Vasc:  +2 radial pulses; mild edema  Patient experienced vagal episode prior to me entering the room.  His blood pressure and heart rate were low.  They are now back to normal.  He otherwise has no complaints.  His troponin is up to 500.  This is likely due to his PEA arrest.  His wide-complex tachycardia was likely due to atrial fibrillation with a rapid ventricular response in the presence of an  IVCD.  I do not see a compelling indication for an ischemic evaluation given his PEA arrest.  Continue current therapy for now.  His lungs seem relatively clear today.  I discussed with family at bedside.  Continue to hold Pradaxa, monitor hematuria; possibly restart tomorrow.   Alverda Skeans, MD Pager [phone]

## 2022-11-09 ENCOUNTER — Inpatient Hospital Stay (HOSPITAL_COMMUNITY): Payer: Medicare PPO

## 2022-11-09 DIAGNOSIS — K761 Chronic passive congestion of liver: Secondary | ICD-10-CM | POA: Diagnosis not present

## 2022-11-09 DIAGNOSIS — I1310 Hypertensive heart and chronic kidney disease without heart failure, with stage 1 through stage 4 chronic kidney disease, or unspecified chronic kidney disease: Secondary | ICD-10-CM | POA: Diagnosis not present

## 2022-11-09 DIAGNOSIS — J90 Pleural effusion, not elsewhere classified: Secondary | ICD-10-CM

## 2022-11-09 DIAGNOSIS — I5023 Acute on chronic systolic (congestive) heart failure: Secondary | ICD-10-CM

## 2022-11-09 DIAGNOSIS — I4821 Permanent atrial fibrillation: Secondary | ICD-10-CM

## 2022-11-09 DIAGNOSIS — I469 Cardiac arrest, cause unspecified: Secondary | ICD-10-CM | POA: Diagnosis not present

## 2022-11-09 LAB — GLUCOSE, CAPILLARY
Glucose-Capillary: 109 mg/dL — ABNORMAL HIGH (ref 70–99)
Glucose-Capillary: 126 mg/dL — ABNORMAL HIGH (ref 70–99)
Glucose-Capillary: 127 mg/dL — ABNORMAL HIGH (ref 70–99)
Glucose-Capillary: 98 mg/dL (ref 70–99)

## 2022-11-09 LAB — BASIC METABOLIC PANEL
Anion gap: 12 (ref 5–15)
BUN: 53 mg/dL — ABNORMAL HIGH (ref 8–23)
CO2: 25 mmol/L (ref 22–32)
Calcium: 8.9 mg/dL (ref 8.9–10.3)
Chloride: 99 mmol/L (ref 98–111)
Creatinine, Ser: 3.1 mg/dL — ABNORMAL HIGH (ref 0.61–1.24)
GFR, Estimated: 19 mL/min — ABNORMAL LOW (ref >60)
Glucose, Bld: 132 mg/dL — ABNORMAL HIGH (ref 70–99)
Potassium: 4.9 mmol/L (ref 3.5–5.1)
Sodium: 136 mmol/L (ref 135–145)

## 2022-11-09 LAB — URINALYSIS, ROUTINE W REFLEX MICROSCOPIC
Bilirubin Urine: NEGATIVE
Glucose, UA: NEGATIVE mg/dL
Ketones, ur: NEGATIVE mg/dL
Nitrite: NEGATIVE
Protein, ur: 30 mg/dL — AB
RBC / HPF: >50 RBC/hpf (ref 0–5)
Specific Gravity, Urine: 1.018 (ref 1.005–1.030)
WBC, UA: >50 WBC/hpf (ref 0–5)
pH: 5 (ref 5.0–8.0)

## 2022-11-09 MED ORDER — FUROSEMIDE 10 MG/ML IJ SOLN
80.0000 mg | Freq: Two times a day (BID) | INTRAMUSCULAR | Status: DC
  Administered 2022-11-09: 80 mg via INTRAVENOUS
  Filled 2022-11-09: qty 8

## 2022-11-09 MED ORDER — APIXABAN 2.5 MG PO TABS: 2.5000 mg | ORAL_TABLET | Freq: Two times a day (BID) | ORAL | Status: DC

## 2022-11-21 ENCOUNTER — Ambulatory Visit: Payer: Medicare PPO | Admitting: Podiatry

## 2022-11-22 ENCOUNTER — Other Ambulatory Visit: Payer: Medicare PPO | Admitting: Urology

## 2022-12-08 NOTE — Progress Notes (Signed)
Progress Note  Patient Name: Shawn Blanchard Date of Encounter: 11/15/2022  Primary Cardiologist: Norman Herrlich, MD  Subjective   No acute events overnight, no symptoms.  Inpatient Medications    Scheduled Meds:  acetaminophen  650 mg Oral Q6H   Chlorhexidine Gluconate Cloth  6 each Topical Daily   docusate sodium  100 mg Oral BID   famotidine  20 mg Oral Daily   heparin  5,000 Units Subcutaneous Q8H   insulin aspart  0-15 Units Subcutaneous Q4H   lidocaine  1 patch Transdermal Q24H   mupirocin ointment   Nasal BID   polyethylene glycol  17 g Oral Daily   Continuous Infusions:  PRN Meds: mouth rinse, oxyCODONE   Vital Signs    Vitals:   11/21/2022 0014 12/05/2022 0431 12/06/2022 0433 11/28/2022 0750  BP: (!) 125/39 (!) 101/30 97/76 (!) 126/59  Pulse: 76 67 71 74  Resp: 20 18 19 19   Temp: 98 F (36.7 C) 98 F (36.7 C)  97.8 F (36.6 C)  TempSrc: Oral Oral    SpO2: 99% 100% 97% 99%  Weight:   117.6 kg   Height:        Intake/Output Summary (Last 24 hours) at 11/07/2022 1034 Last data filed at 12/07/2022 0900 Gross per 24 hour  Intake 240 ml  Output 550 ml  Net -310 ml   Filed Weights   11/07/22 0654 11/08/22 0312 11/19/2022 0433  Weight: 116.5 kg 119.7 kg 117.6 kg    Telemetry     Personally reviewed, A-fib, rate controlled  ECG    Not performed today  Physical Exam   GEN: No acute distress.   Neck: No JVD. Cardiac: RRR, no murmur, rub, or gallop.  Respiratory: Nonlabored. Clear to auscultation bilaterally. GI: Soft, nontender, bowel sounds present. MS: No edema; No deformity. Neuro:  Nonfocal. Psych: Alert and oriented x 3. Normal affect.  Labs    Chemistry Recent Labs  Lab 10/20/2022 1111 11/05/2022 1129 10/12/2022 1402 10/18/2022 1904 11/07/22 0512 11/08/22 1213  NA 137 143  143 138  --  136 135  K 3.8 3.4*  3.3* 4.1  --  4.3 4.5  CL 103 105  --   --  100 100  CO2 22  --   --   --  24 21*  GLUCOSE 148* 131*  --   --  130* 111*  BUN 33*  36*  --   --  35* 45*  CREATININE 1.60* 1.40*  --  1.77* 2.10* 2.94*  CALCIUM 8.7*  --   --   --  9.2 8.9  PROT 6.8  --   --   --  6.0*  --   ALBUMIN 3.7  --   --   --  3.3*  --   AST 28  --   --   --  46*  --   ALT 13  --   --   --  22  --   ALKPHOS 79  --   --   --  65  --   BILITOT 0.9  --   --   --  1.7*  --   GFRNONAA 42*  --   --  37* 30* 20*  ANIONGAP 12  --   --   --  12 14     Hematology Recent Labs  Lab 11/07/22 0121 11/08/22 1213 11/15/2022 0016  WBC 9.6 10.4 11.7*  RBC 3.50* 3.15* 2.74*  HGB 10.5* 9.3* 8.4*  HCT 32.9* 28.7* 25.0*  MCV 94.0 91.1 91.2  MCH 30.0 29.5 30.7  MCHC 31.9 32.4 33.6  RDW 15.9* 15.8* 15.8*  PLT 196 182 128*    Cardiac Enzymes Recent Labs  Lab 10/18/2022 1111 10/07/2022 1343 10/27/2022 1607  TROPONINIHS 33* 148* 500*    BNP Recent Labs  Lab 10/28/2022 1121  BNP 840.3*     DDimerNo results for input(s): "DDIMER" in the last 168 hours.   Radiology    No results found.  Cardiac Studies     Assessment & Plan   # ADHF/ HFmidEF LVEF 45 to 50% # Bilateral pleural effusions and abdominal ascites # AKI, likely cardiorenal -Serum creatinine gradually worsening (1.5 on admission worsened to 2.1 and 2.9 today), total bilirubin 1.7 and AST 46 on 10/24/2022. Labs are consistent with volume overload and cardiorenal. Obtain chest x-ray. Will start IV Lasix 80 mg twice daily. Repeat BMP in the p.m.  # PEA arrest likely secondary to airway obstruction from laying prone in the CT scan -Briefly on amiodarone due to wide-complex tachycardia (A-fib with intermittent conduction delay) and currently off Amio.  No indication of ischemia evaluation.  # Permanent A-fib # Transient WCT (A-fib with IVCD) -Rate controlled, not on rate controlling agents -Concern for hematuria, CT hematuria workup was negative.  Okay to restart home Pradaxa.   Signed, Marjo Bicker, MD  11/19/2022, 10:34 AM

## 2022-12-08 NOTE — Progress Notes (Signed)
PROGRESS NOTE    Shawn Blanchard  ZOX:[id number] DOB: May 07, 1935 DOA: 10/20/2022 PCP: Loyola Mast, MD  87/M, obese chronically ill with systolic CHF, EF 81%, history of CAD, paroxysmal A-fib, type 2 diabetes mellitus, sleep apnea recently undergoing workup for hematuria per Dr. Pete Glatter was getting an outpatient CT, subsequently found to be in prone position and not breathing, CODE BLUE called for PEA arrest, underwent 6 to 8 minutes of ACLS before ROSC, then converted to V-fib, shocked twice, intubated and transferred to [facility], briefly required Levophed. -Suspected to be respiratory arrest, extubated -Echo noted unchanged EF, no wall motion abnormality, seen by cardiology in consultation -Transferred to [facility] service 8/2 [facility] course complicated by worsening AKI -8/3,-restarted IV Lasix, resumed AC-Eliquis instead of Pradaxa in the setting of AKI   Subjective: -Near syncopal event yesterday afternoon, denies any significant complaints, appears tachypneic  Assessment and Plan:   SP PEA arrest with transient V-fib Acute hypoxic respiratory failure -Multifactorial, primarily suspected to be an airway obstruction event, noted to be laying prone for CT leading to respiratory arrest -Seen by cards, low concern for ACS, amiodarone discontinued -Echo unchanged with EF 45-50%, no new wall motion abnormality -Ambulate, PT OT eval  Acute on chronic systolic CHF -Last echo with EF 45%, clinically appears volume overloaded, also has concomitant AKI on CKD -Start IV Lasix today, monitor urine output and kidney function closely  Acute kidney injury -Likely cardiorenal, baseline creatinine 1.4, trended up to 2.9 yesterday, now 3.1, likely plateauing -Also volume overloaded, starting IV Lasix today, monitor with diuresis  Permanent A-fib -With V-fib after resuscitation, shocked X2 -Treated with amiodarone briefly, discontinued yesterday' -Restart anticoagulation, hold off on Pradaxa in  the setting of AKI, use Eliquis instead  Mildly elevated troponin -Suspected to be demand ischemia, clinically do not suspect ACS, echo unchanged as noted above  Hematuria -Noted to have significant hematuria this admission, leave Foley catheter in for now -Restart anticoagulation today  Type 2 diabetes mellitus -CBGs are stable, continue sliding scale insulin  DVT prophylaxis: Apixaban Code Status: Full code discussed CODE STATUS with family again, they will discuss further Family Communication: Family at bedside Disposition Plan: May need rehab  Consultants:    Procedures:   Antimicrobials:    Objective: Vitals:   12/06/2022 0014 12/03/2022 0431 12/04/2022 0433 11/13/2022 0750  BP: (!) 125/39 (!) 101/30 97/76 (!) 126/59  Pulse: 76 67 71 74  Resp: 20 18 19 19   Temp: 98 F (36.7 C) 98 F (36.7 C)  97.8 F (36.6 C)  TempSrc: Oral Oral    SpO2: 99% 100% 97% 99%  Weight:   117.6 kg   Height:        Intake/Output Summary (Last 24 hours) at 11/16/2022 1123 Last data filed at 12/05/2022 1100 Gross per 24 hour  Intake 240 ml  Output 750 ml  Net -510 ml   Filed Weights   11/07/22 0654 11/08/22 0312 12/04/2022 0433  Weight: 116.5 kg 119.7 kg 117.6 kg    Examination:  General exam: Obese chronically ill male sitting up in bed, tachypneic HEENT: Positive JVD CVS: S1-S2, irregular rhythm Lungs: Decreased breath sounds to bases Abdomen: Firm, distended, swollen, nontender, bowel sounds present Extremities: 1+ edema  Skin: No rashes Psychiatry: Flat affect    Data Reviewed:   CBC: Recent Labs  Lab 10/26/2022 1111 10/25/2022 1129 10/10/2022 1402 11/01/2022 1904 11/07/22 0121 11/08/22 1213 12/04/2022 0016  WBC 11.1*  --   --  13.8* 9.6 10.4 11.7*  NEUTROABS 5.2  --   --   --   --   --   --   HGB 12.2*   < > 12.2* 11.6* 10.5* 9.3* 8.4*  HCT 39.0   < > 36.0* 37.2* 32.9* 28.7* 25.0*  MCV 93.1  --   --  93.0 94.0 91.1 91.2  PLT 178  --   --  237 196 182 128*   < > = values in  this interval not displayed.   Basic Metabolic Panel: Recent Labs  Lab 10/14/2022 1111 11/05/2022 1129 10/29/2022 1402 10/18/2022 1904 11/07/22 0512 11/08/22 1213 11/08/2022 1036  NA 137 143  143 138  --  136 135 136  K 3.8 3.4*  3.3* 4.1  --  4.3 4.5 4.9  CL 103 105  --   --  100 100 99  CO2 22  --   --   --  24 21* 25  GLUCOSE 148* 131*  --   --  130* 111* 132*  BUN 33* 36*  --   --  35* 45* 53*  CREATININE 1.60* 1.40*  --  1.77* 2.10* 2.94* 3.10*  CALCIUM 8.7*  --   --   --  9.2 8.9 8.9  MG 2.1  --   --   --  2.0 2.0  --   PHOS  --   --   --   --  4.7*  --   --    GFR: Estimated Creatinine Clearance: 23.1 mL/min (A) (by C-G formula based on SCr of 3.1 mg/dL (H)). Liver Function Tests: Recent Labs  Lab 10/16/2022 1111 11/07/22 0512  AST 28 46*  ALT 13 22  ALKPHOS 79 65  BILITOT 0.9 1.7*  PROT 6.8 6.0*  ALBUMIN 3.7 3.3*   No results for input(s): "LIPASE", "AMYLASE" in the last 168 hours. No results for input(s): "AMMONIA" in the last 168 hours. Coagulation Profile: No results for input(s): "INR", "PROTIME" in the last 168 hours. Cardiac Enzymes: No results for input(s): "CKTOTAL", "CKMB", "CKMBINDEX", "TROPONINI" in the last 168 hours. BNP (last 3 results) Recent Labs    03/18/22 1445 07/25/22 0922  PROBNP 609.0* 534.0*   HbA1C: No results for input(s): "HGBA1C" in the last 72 hours. CBG: Recent Labs  Lab 11/08/22 1554 11/08/22 2109 11/10/2022 0017 11/15/2022 0433 11/25/2022 0755  GLUCAP 124* 114* 127* 98 109*   Lipid Profile: No results for input(s): "CHOL", "HDL", "LDLCALC", "TRIG", "CHOLHDL", "LDLDIRECT" in the last 72 hours. Thyroid Function Tests: No results for input(s): "TSH", "T4TOTAL", "FREET4", "T3FREE", "THYROIDAB" in the last 72 hours. Anemia Panel: No results for input(s): "VITAMINB12", "FOLATE", "FERRITIN", "TIBC", "IRON", "RETICCTPCT" in the last 72 hours. Urine analysis:    Component Value Date/Time   COLORURINE YELLOW 10/22/2022 1142    APPEARANCEUR HAZY (A) 10/21/2022 1142   APPEARANCEUR Cloudy (A) 10/30/2022 0000   LABSPEC 1.020 10/18/2022 1142   PHURINE 6.5 10/07/2022 1142   GLUCOSEU 100 (A) 11/03/2022 1142   GLUCOSEU NEGATIVE 09/24/2021 1502   HGBUR LARGE (A) 10/29/2022 1142   BILIRUBINUR SMALL (A) 10/27/2022 1142   BILIRUBINUR Negative 10/30/2022 0000   KETONESUR NEGATIVE 10/20/2022 1142   PROTEINUR >=300 (A) 10/22/2022 1142   UROBILINOGEN 0.2 10/02/2021 1038   UROBILINOGEN 0.2 09/24/2021 1502   NITRITE NEGATIVE 10/26/2022 1142   LEUKOCYTESUR NEGATIVE 10/16/2022 1142   Sepsis Labs: @LABRCNTIP (procalcitonin:4,lacticidven:4)  ) Recent Results (from the past 240 hour(s))  SARS Coronavirus 2 by RT PCR (hospital order, performed in [facility] hospital lab) *cepheid  single result test* Anterior Nasal Swab     Status: None   Collection Time: 10/22/2022 11:42 AM   Specimen: Anterior Nasal Swab  Result Value Ref Range Status   SARS Coronavirus 2 by RT PCR NEGATIVE NEGATIVE Final    Comment: (NOTE) SARS-CoV-2 target nucleic acids are NOT DETECTED.  The SARS-CoV-2 RNA is generally detectable in upper and lower respiratory specimens during the acute phase of infection. The lowest concentration of SARS-CoV-2 viral copies this assay can detect is 250 copies / mL. A negative result does not preclude SARS-CoV-2 infection and should not be used as the sole basis for treatment or other patient management decisions.  A negative result may occur with improper specimen collection / handling, submission of specimen other than nasopharyngeal swab, presence of viral mutation(s) within the areas targeted by this assay, and inadequate number of viral copies (<250 copies / mL). A negative result must be combined with clinical observations, patient history, and epidemiological information.  Fact Sheet for Patients:   RoadLapTop.co.za  Fact Sheet for Healthcare  Providers: http://kim-miller.com/  This test is not yet approved or  cleared by the [place] FDA and has been authorized for detection and/or diagnosis of SARS-CoV-2 by FDA under an Emergency Use Authorization (EUA).  This EUA will remain in effect (meaning this test can be used) for the duration of the COVID-19 declaration under Section 564(b)(1) of the Act, 21 U.S.C. section 360bbb-3(b)(1), unless the authorization is terminated or revoked sooner.  Performed at [facility], [address] [place], [place] [address]   Urine Culture     Status: None   Collection Time: 10/11/2022 11:42 AM   Specimen: Urine, Clean Catch  Result Value Ref Range Status   Specimen Description   Final    URINE, CLEAN CATCH Performed at [facility], [address] [place], [place] [address]    Special Requests   Final    NONE Reflexed from [id number] Performed at [facility], [address] [place], [place] [address]    Culture   Final    NO GROWTH Performed at [facility], [address], [place], [place] [address]    Report Status 11/07/2022 FINAL  Final  MRSA Next Gen by PCR, Nasal     Status: Abnormal   Collection Time: 11/07/22  6:30 AM   Specimen: Nasal Mucosa; Nasal Swab  Result Value Ref Range Status   MRSA by PCR Next Gen DETECTED (A) NOT DETECTED Final    Comment: RESULT CALLED TO, READ BACK BY AND VERIFIED WITH: RN Lauro Regulus ON [id number] @0835  BY SM (NOTE) The GeneXpert MRSA Assay (FDA approved for NASAL specimens only), is one component of a comprehensive MRSA colonization surveillance program. It is not intended to diagnose MRSA infection nor to guide or monitor treatment for MRSA infections. Test performance is not FDA approved in patients less than 6 years old. Performed at [facility], [address], [place], [place] [address]      Radiology Studies: No results found.   Scheduled Meds:  acetaminophen  650 mg  Oral Q6H   Chlorhexidine Gluconate Cloth  6 each Topical Daily   docusate sodium  100 mg Oral BID   famotidine  20 mg Oral Daily   furosemide  80 mg Intravenous BID   heparin  5,000 Units Subcutaneous Q8H   insulin aspart  0-15 Units Subcutaneous Q4H   lidocaine  1 patch Transdermal Q24H  mupirocin ointment   Nasal BID   polyethylene glycol  17 g Oral Daily   Continuous Infusions:   LOS: 3 days    Time spent:    Zannie Cove, MD [facility]   11/22/2022, 11:23 AM

## 2022-12-08 NOTE — Progress Notes (Signed)
Patient's daughter came to nursing station stating her father is c/o of burning in groin area. Patient with enlarged scrotum. Mucus around tip foley catheter. Foreskin of penis swollen. Hospitalist made aware.

## 2022-12-08 NOTE — Progress Notes (Signed)
   11/07/2022 1310  Spiritual Encounters  Type of Visit Initial  Care provided to: Family  Referral source Code page  Reason for visit Code  OnCall Visit Yes   This Chartered loss adjuster and Tana Felts responded to CODE [employer] on 3E; when [facility] arrived, medical team gathered outside pt.'s room.  Pt.'s daughter [name] sitting in chair in hallway being supported by staff; chaplains assumed support of dtr. and remained with her while she spoke with other family members re: decision about whether to continue CPR.  Family made decision to discontinue compressions; [name] shared that pt. had a cardiac arrest and CPR this past Wednesday during at CT scan and that she and family did not want to put pt. through this again.  [name] shared that pt. recovered remarkably well from code and intubation on Wednesday and had been able to come off the ventilator and interact with her and other family members who came to visit him in the last few days. Chaplains remained with [name] until her daughter arrived.  Patient placement card given to family.  Dtr. [name] is family contact person.  Chaplains remain available as needed.

## 2022-12-08 NOTE — Progress Notes (Addendum)
ANTICOAGULATION CONSULT NOTE - Initial Consult  Pharmacy Consult for apixaban Indication: atrial fibrillation  Allergies  Allergen Reactions   Ace Inhibitors Rash   Amoxicillin Nausea Only   Atorvastatin Other (See Comments)    Dizziness.   Jardiance [Empagliflozin] Other (See Comments)    Orthostasis   Meperidine Rash   Naproxen Sodium Rash   Sulfa Antibiotics Nausea Only   Tetracycline Nausea Only    Patient Measurements: Height: 5\' 11"  (180.3 cm) Weight: 117.6 kg (259 lb 4.2 oz) IBW/kg (Calculated) : 75.3   Vital Signs: Temp: 97.8 F (36.6 C) (08/03 0750) Temp Source: Oral (08/03 0431) BP: 126/59 (08/03 0750) Pulse Rate: 74 (08/03 0750)  Labs: Recent Labs    10/18/2022 1343 10/25/2022 1402 10/19/2022 1607 10/28/2022 1904 11/07/22 0121 11/07/22 0512 11/08/22 1213 11/28/2022 0016 11/12/2022 1036  HGB  --    < >  --    < > 10.5*  --  9.3* 8.4*  --   HCT  --    < >  --    < > 32.9*  --  28.7* 25.0*  --   PLT  --   --   --    < > 196  --  182 128*  --   CREATININE  --   --   --    < >  --  2.10* 2.94*  --  3.10*  TROPONINIHS 148*  --  500*  --   --   --   --   --   --    < > = values in this interval not displayed.    Estimated Creatinine Clearance: 23.1 mL/min (A) (by C-G formula based on SCr of 3.1 mg/dL (H)).   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   Aortic atherosclerosis (HCC) 06/22/2021   Arthritis 03/12/2016   Blepharitis of both upper and lower eyelid 10/02/2018   BMI 35.0-35.9,adult 04/11/2016   Carpal tunnel syndrome of right wrist 03/25/2019   Chronic anticoagulation 10/11/2016   Chronic atrial fibrillation (HCC) 06/12/2015   Overview:  Managed CARDS   Chronic diastolic heart failure (HCC) 09/01/2015   Class 1 obesity 08/08/2021   Coronary artery disease involving native coronary artery of native heart with angina  pectoris (HCC) 02/15/2015   Overview:  Hx of MI with stent to LAD 2002 MPS 2016 with EF 47% and no ischemia   Dermatochalasis of both upper eyelids 11/21/2016   Diabetic foot infection (HCC) 08/08/2021   Diabetic polyneuropathy associated with type 2 diabetes mellitus (HCC) 12/16/2018   2020   Disorder of sesamoid bone of foot    Essential hypertension 11/07/2017   Floppy eyelid syndrome of both eyes 10/05/2019   Ganglion cyst 11/27/2015   Overview:  2017: dorsum right hand   Gastroesophageal reflux disease without esophagitis 06/12/2015   Gout of left foot    History of lymphoma 05/17/2021   History of squamous cell carcinoma in situ 04/18/2021   Hyperlipidemia 04/11/2016   Hypertensive heart disease with heart failure (HCC) 02/15/2015   Hypertensive urgency 05/17/2021   Idiopathic chronic gout 05/17/2015   Insomnia 11/27/2015   Iron deficiency anemia 04/27/2021   Leukocytosis 04/11/2016   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   Male erectile disorder 06/17/2016   Meibomian gland dysfunction (MGD) of both eyes 10/02/2018   Normocytic anemia 05/17/2021   Obstructive sleep apnea 08/30/2015  Overview:  CPAP   Old MI (myocardial infarction)    Orthostatic dizziness 09/15/2017   2018: med related   Peripheral venous insufficiency 11/08/2020   Peritoneal adhesion 11/08/2020   Pseudophakia of both eyes 03/12/2016   Pulmonary hypertension (HCC) 08/30/2015   Overview:  Managed CARDS   Restless leg syndrome 08/30/2015   SBO (small bowel obstruction) (HCC) 12/14/2019   Sensorineural hearing loss (SNHL), bilateral 10/04/2019   Small bowel obstruction (HCC) 04/11/2016   Stage 3a chronic kidney disease (HCC) 06/13/2019   Statin intolerance 03/09/2021   Type 2 diabetes mellitus with neurologic complication, without long-term current use of insulin (HCC) 12/02/2014   Type 2 diabetes mellitus with stage 3a chronic kidney disease, without long-term current use of insulin (HCC) 02/22/2020   Ulcer of  left foot with fat layer exposed (HCC)    UTI (urinary tract infection) 05/17/2021    Assessment: 87 year old male with a PMH significant for Afib (on pradaxa PTA), HFrEF, CAD, T2DM, obesity presented s/p cardiac arrest while proned for outpatient CT scan. Patient was being seen for workup of hematuria. Since admission, CT hematuria workup was negative. Pharmacy was consulted to start apixaban for anticoagulation in the setting of AKI.  Hgb 8.4, Plt 128 trending down. Scr 3.1, age > 53 (qualifying for lower dose)   Goal of Therapy:  Monitor platelets by anticoagulation protocol: Yes   Plan:  Initiate apixaban 2.5 mg BID, first dose now Monitor CBC and for signs/symptoms bleeding Monitor renal function   Shawn Blanchard, PharmD PGY1 Pharmacy Resident 11/25/2022 11:53 AM

## 2022-12-08 NOTE — Progress Notes (Signed)
Dressing changed performed to R forearm that is weeping. Intermittent confusion noted.

## 2022-12-08 NOTE — Progress Notes (Signed)
Staff assist pulled by nurse aide. Patient unresponsive. CPR started. Daughter at bedside, POA stated she did not want compressions to be started.

## 2022-12-08 NOTE — Code Documentation (Signed)
Patient Name: Shawn Blanchard   MRN: [id number]   Date of Birth/ Sex: Apr 16, 1935 , male      Admission Date: 22-Nov-2022  Attending Provider: Zannie Cove, MD  Primary Diagnosis: Cardiac arrest [facility]) [I46.9]   Indication: Pt was in his usual state of health until this PM, when he was noted to be unstable. Code blue was subsequently called. At the time of arrival on scene, ACLS protocol was underway.   Technical Description:  - CPR performance duration:  Not a full cycle of compressions. Daughter present at bedside, granddaughter and wife over the phone, wanted to cease all measures.   - Was defibrillation or cardioversion used? No   - Was external pacer placed? No  - Was patient intubated pre/post CPR? No   Medications Administered: Y = Yes; Blank = No Amiodarone    Atropine    Calcium    Epinephrine    Lidocaine    Magnesium    Norepinephrine    Phenylephrine    Sodium bicarbonate    Vasopressin    Other    Post CPR evaluation:  - Final Status - Was patient successfully resuscitated ? No   Miscellaneous Information:  - Time of death:  13:15   - Primary team notified?  Yes  - Family Notified? Yes     Clerance Umland, DO   11/20/2022, 1:22 PM

## 2022-12-08 NOTE — Progress Notes (Addendum)
WRONG PT 

## 2022-12-08 NOTE — Death Summary Note (Signed)
Death Summary  Shawn Blanchard:[id number] DOB: 09-Mar-1936 DOA: Dec 01, 2022  PCP: Loyola Mast, MD  Admit date: 12-01-22 Date of Death: Dec 04, 2022  Final Diagnoses:  Principal Problem:   Cardiac arrest (HCC) Acute systolic CHF Acute hypoxic respiratory failure Acute kidney injury Permanent atrial fibrillation Type 2 diabetes mellitus   AKI (acute kidney injury) (HCC)   Type 2 diabetes mellitus with diabetic polyneuropathy, without long-term current use of insulin (HCC)    History of present illness:  86/M, obese chronically ill with systolic CHF, EF 95%, history of CAD, paroxysmal A-fib, type 2 diabetes mellitus, sleep apnea recently undergoing workup for hematuria per Dr. Pete Glatter was getting an outpatient CT, subsequently found to be in prone position and not breathing, CODE BLUE called for PEA arrest, underwent 6 to 8 minutes of ACLS before ROSC, then converted to V-fib, shocked twice, intubated and transferred to [facility], briefly required Levophed. -Suspected to be respiratory arrest, extubated -Echo noted unchanged EF, no wall motion abnormality, seen by cardiology in consultation -Transferred to [facility] service 8/2 [facility] course complicated by worsening AKI -8/3,-restarted IV Lasix, resumed AC-Eliquis instead of Pradaxa in the setting of AKI   Hospital Course:   [name] is a 86/male, obese with chronic systolic CHF, sleep apnea, paroxysmal A-fib, type 2 diabetes mellitus, sleep apnea and obesity was admitted as an out of hospital cardiac arrest, PEA arrest followed by V-fib transferred to [facility], ICU -His event was suspected to be respiratory in nature by pulmonary critical care service, -Seen by cardiology, echo noted EF of 45% with no new wall motion abnormality -Subsequently extubated and transferred to [facility] service yesterday.  Hospital course notable for worsening renal failure, likely ATN, cardiorenal from arrest. -He was noted to be more volume overloaded and  started on IV Lasix today and anticoagulation with Eliquis was resumed instead of Pradaxa in the setting of AKI, his anticoagulation was held in ICU previously for hematuria. -At 1: 10 PM patient was noted to be unresponsive by nurse, daughter/POA was at bedside, he was in PEA his daughter did not want CPR or chest compressions since this would be his second cardiac arrest in a matter of 3 to 4 days.  Patient subsequently expired and was pronounced dead  Time:  Signed:  Zannie Cove  [facility] 11/23/2022, 11:55 AM

## 2022-12-08 DEATH — deceased

## 2022-12-25 ENCOUNTER — Ambulatory Visit: Payer: Medicare PPO | Admitting: Cardiology

## 2023-01-01 ENCOUNTER — Ambulatory Visit: Payer: Medicare PPO | Admitting: Pulmonary Disease

## 2023-02-05 ENCOUNTER — Ambulatory Visit: Payer: Medicare PPO | Admitting: Family Medicine

## 2023-04-08 IMAGING — MR MR FOOT*L* WO/W CM
9 series · 39 of 40 positions shown · IV contrast (10 ML GADAVIST)
Comparison: 08/08/2021

CLINICAL DATA: Recurrent left great toe ulcer.

EXAM:
MRI OF THE LEFT FOREFOOT WITHOUT AND WITH CONTRAST
TECHNIQUE: Multiplanar, multisequence MR imaging of the left forefoot was
performed both before and after administration of intravenous
contrast.
CONTRAST:  10mL GADAVIST GADOBUTROL 1 MMOL/ML IV SOLN

[Series 4: T1 · coronal · left · 3.0mm · 0.47mm/px · 6 of 40 slices shown (1 of 2)]
[im 1/40]
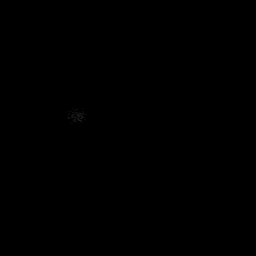
[im 8/40]
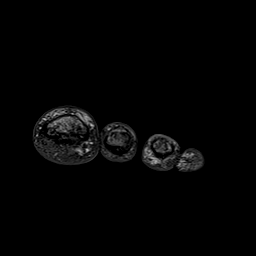
[im 16/40]
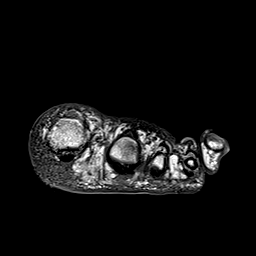
[im 24/40]
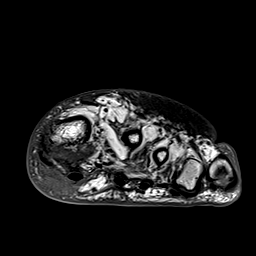
[im 32/40]
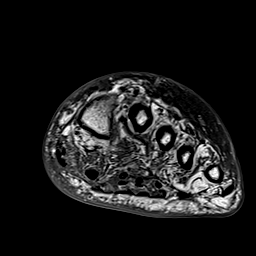
[im 40/40]
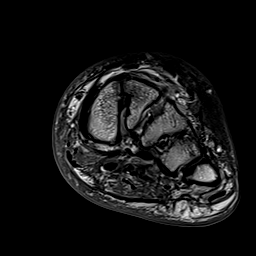

[Series 5: T2 fat-sat · coronal · left · 3.0mm · 0.38mm/px · 6 of 40 slices shown (1 of 2)]
[im 1/40]
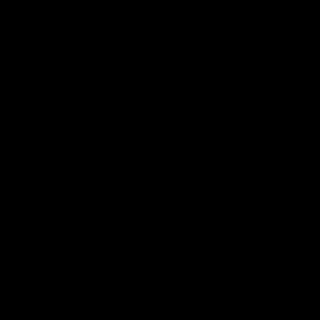
[im 8/40]
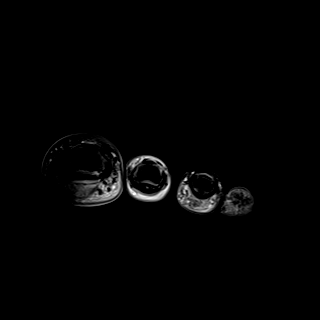
[im 16/40]
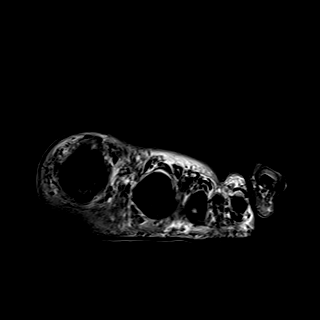
[im 24/40]
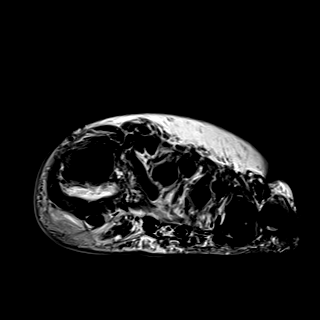
[im 32/40]
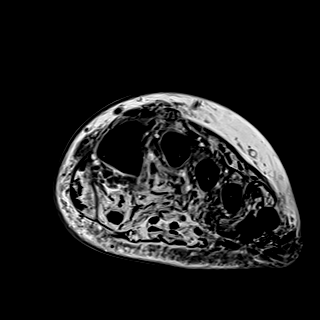
[im 40/40]
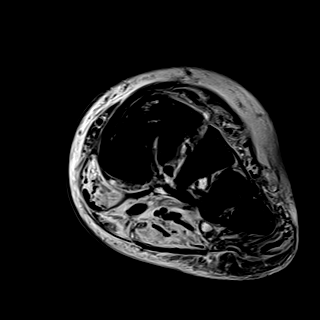

[Series 6: T2 fat-sat · axial · left · 3.0mm · 0.70mm/px · z∈[-26,+53]mm · 4 of 26 slices shown (2 of 2)]
[im 1/26]
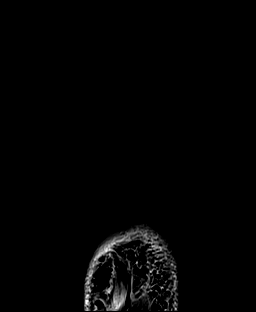
[im 9/26]
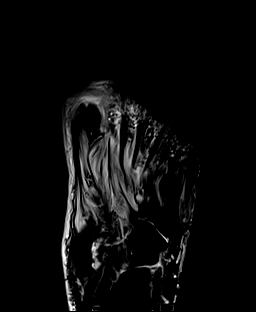
[im 17/26]
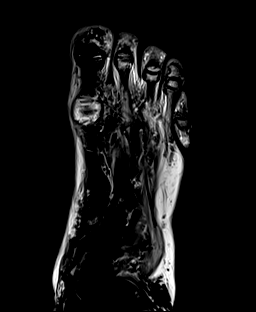
[im 26/26]
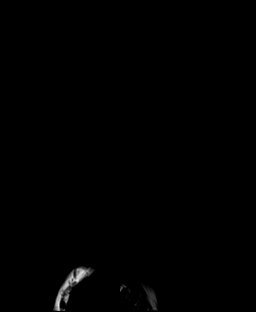

[Series 7: T1 · axial · left · 3.0mm · 0.70mm/px · z∈[-26,+53]mm · 3 of 26 slices shown (2 of 2)]
[im 1/26]
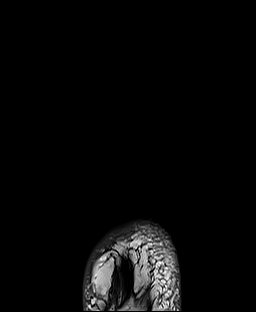
[im 13/26]
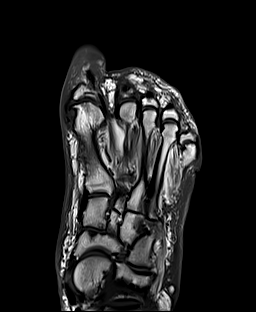
[im 26/26]
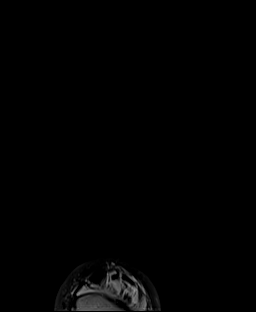

[Series 8: STIR · sagittal · left · 3.0mm · 0.35mm/px · 3 of 32 slices shown]
[im 1/32]
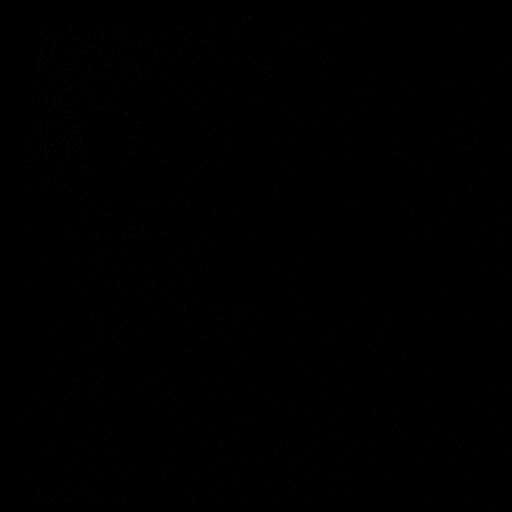
[im 11/32]
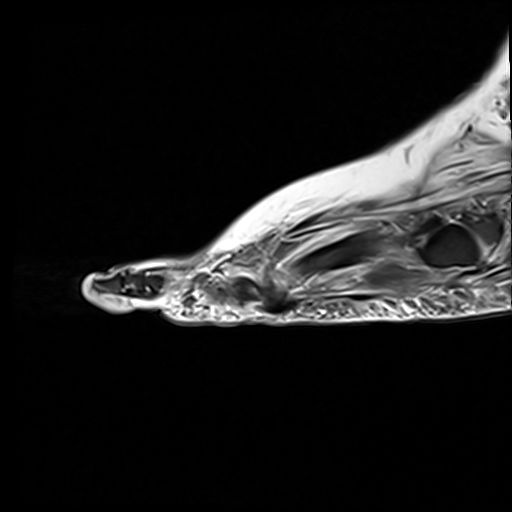
[im 21/32]
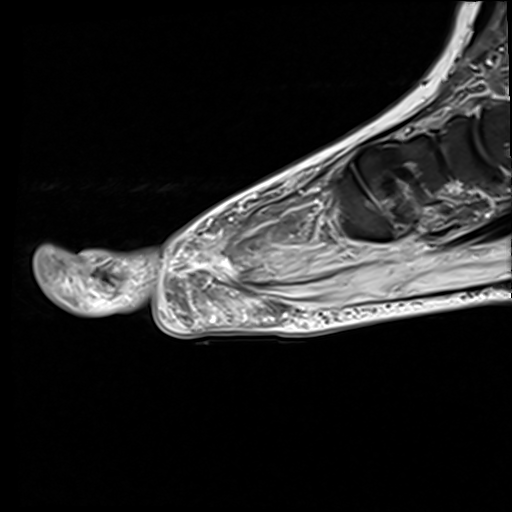

[Series 9: T1 fat-sat · coronal · non-contrast · left · 3.0mm · 0.47mm/px · 5 of 40 slices shown]
[im 1/40]
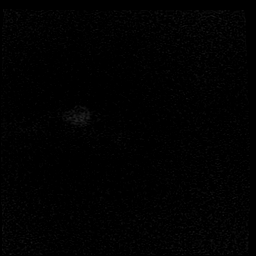
[im 10/40]
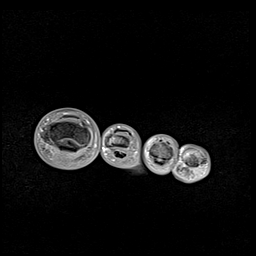
[im 20/40]
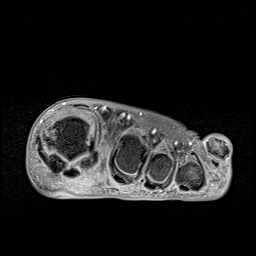
[im 30/40]
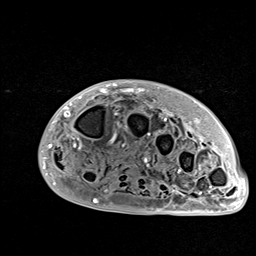
[im 40/40]
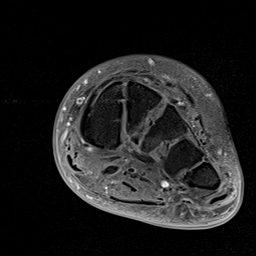

[Series 10: T1 fat-sat post-contrast · coronal · left · 3.0mm · 0.47mm/px · 5 of 40 slices shown (1 of 3)]
[im 1/40]
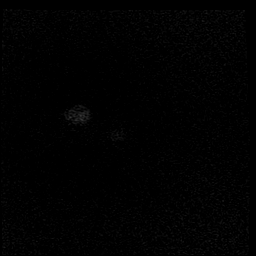
[im 10/40]
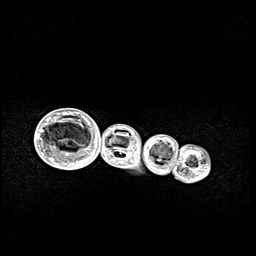
[im 20/40]
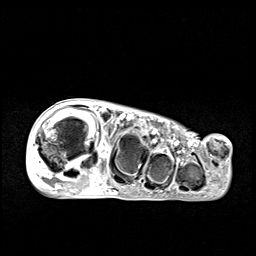
[im 30/40]
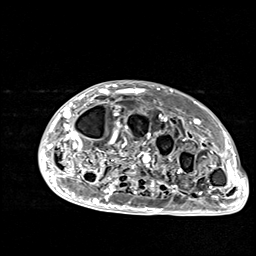
[im 40/40]
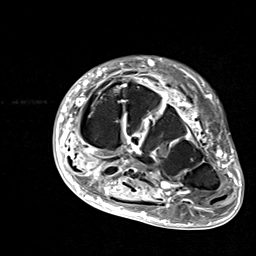

[Series 11: T1 fat-sat post-contrast · sagittal · left · 3.0mm · 0.35mm/px · 4 of 31 slices shown (2 of 3)]
[im 1/31]
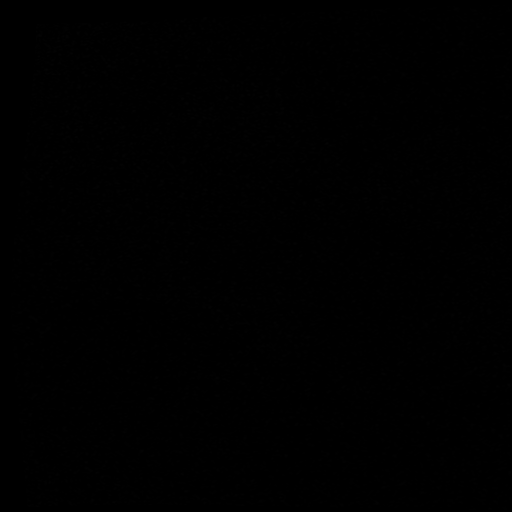
[im 11/31]
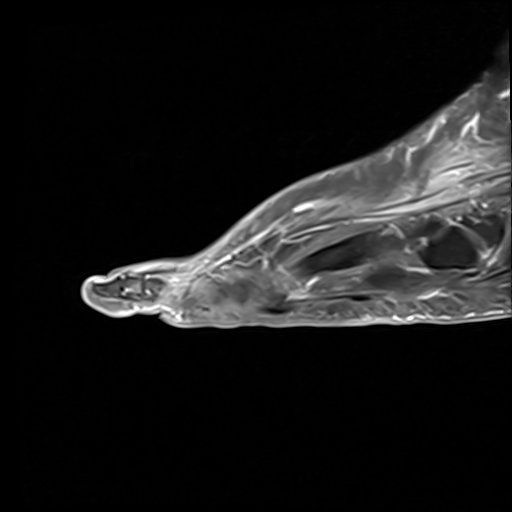
[im 21/31]
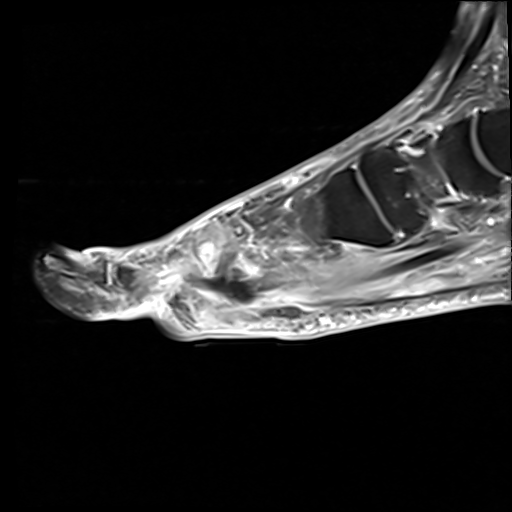
[im 31/31]
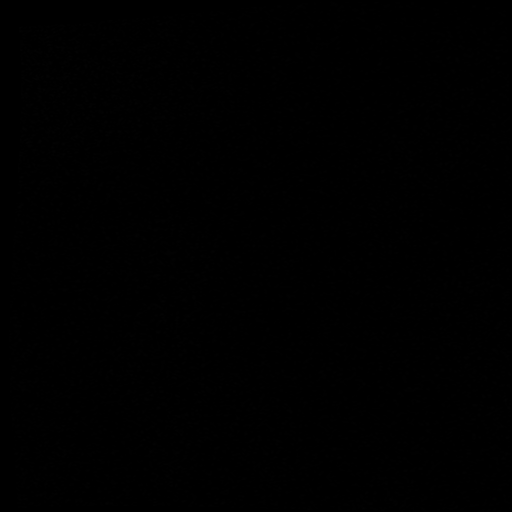

[Series 12: T1 fat-sat post-contrast · axial · left · 3.0mm · 0.56mm/px · z∈[-26,+53]mm · 3 of 26 slices shown (3 of 3)]
[im 1/26]
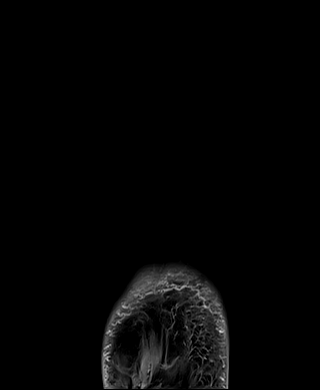
[im 13/26]
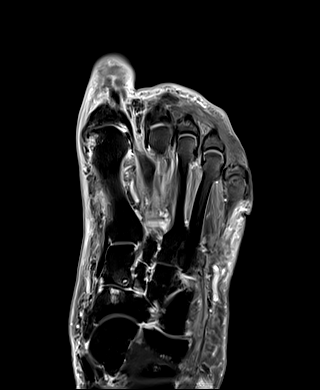
[im 26/26]
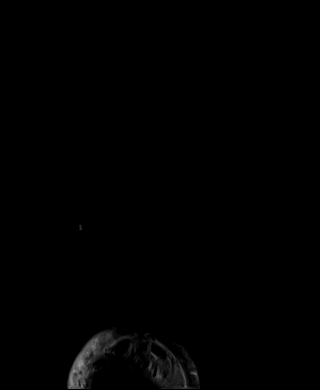

[39 of 40 positions shown; findings below may reference images not displayed]

FINDINGS: Bones/Joint/Cartilage

No fracture or dislocation. Normal alignment.

Mild osteoarthritis of the first MTP joint. Periarticular erosion
involving the medial aspect of the first metatarsal head. Large
first MTP joint effusion with synovitis. Mild edema in the medial
hallux sesamoid and adjacent plantar aspect of the first metatarsal
head likely reflecting arthritic changes.

Moderate osteoarthritis of the navicular-medial cuneiform joint with
subchondral reactive marrow edema.

No other marrow signal abnormality.

Ligaments

Collateral ligaments are intact.  Lisfranc ligament is intact.

Muscles and Tendons

Flexor, peroneal and extensor compartment tendons are intact.
Generalized muscle atrophy.

Soft tissue
No soft tissue mass. Edema in the subcutaneous fat throughout the
foot most severe along the dorsal aspect. Soft tissue wound along
the plantar aspect of the first MTP joint with a 10 mm fluid
collection.
IMPRESSION: 1. Soft tissue wound along the plantar aspect of the first MTP joint
with surrounding cellulitis and a 10 mm abscess. Large first MTP
joint effusion with synovitis. Periarticular erosion involving the
medial aspect of the first metatarsal head. Differential
considerations include septic arthritis of the first MTP joint
versus inflammatory joint effusion secondary to underlying
arthropathy such as gout. If there is further clinical concern,
recommend arthrocentesis.
2. Mild edema in the medial hallux sesamoid and adjacent plantar
aspect of the first metatarsal head likely reflecting arthritic
changes.
3. Moderate osteoarthritis of the navicular-medial cuneiform joint
with subchondral reactive marrow edema.
4. Severe edema of the foot which may be reactive secondary to fluid
overload or venous insufficiency versus cellulitis.
